# Patient Record
Sex: Female | Born: 1935 | Race: White | Hispanic: No | Smoking: Former smoker
Health system: Southern US, Community
[De-identification: ages and names within clinical notes are randomized; demographics above are authoritative.]

## PROBLEM LIST (undated history)

## (undated) DIAGNOSIS — R42 Dizziness and giddiness: Secondary | ICD-10-CM

## (undated) DIAGNOSIS — R0609 Other forms of dyspnea: Secondary | ICD-10-CM

## (undated) DIAGNOSIS — R0600 Dyspnea, unspecified: Secondary | ICD-10-CM

## (undated) DIAGNOSIS — E785 Hyperlipidemia, unspecified: Secondary | ICD-10-CM

## (undated) DIAGNOSIS — S065XAA Traumatic subdural hemorrhage with loss of consciousness status unknown, initial encounter: Secondary | ICD-10-CM

## (undated) DIAGNOSIS — N184 Chronic kidney disease, stage 4 (severe): Secondary | ICD-10-CM

## (undated) DIAGNOSIS — M1990 Unspecified osteoarthritis, unspecified site: Secondary | ICD-10-CM

## (undated) DIAGNOSIS — I10 Essential (primary) hypertension: Secondary | ICD-10-CM

## (undated) DIAGNOSIS — Y92009 Unspecified place in unspecified non-institutional (private) residence as the place of occurrence of the external cause: Secondary | ICD-10-CM

## (undated) DIAGNOSIS — R54 Age-related physical debility: Secondary | ICD-10-CM

## (undated) DIAGNOSIS — W19XXXA Unspecified fall, initial encounter: Secondary | ICD-10-CM

## (undated) DIAGNOSIS — S065X9A Traumatic subdural hemorrhage with loss of consciousness of unspecified duration, initial encounter: Secondary | ICD-10-CM

## (undated) DIAGNOSIS — I2699 Other pulmonary embolism without acute cor pulmonale: Secondary | ICD-10-CM

## (undated) DIAGNOSIS — Z8679 Personal history of other diseases of the circulatory system: Secondary | ICD-10-CM

## (undated) DIAGNOSIS — K589 Irritable bowel syndrome without diarrhea: Secondary | ICD-10-CM

## (undated) DIAGNOSIS — IMO0001 Reserved for inherently not codable concepts without codable children: Secondary | ICD-10-CM

## (undated) DIAGNOSIS — R5383 Other fatigue: Secondary | ICD-10-CM

## (undated) HISTORY — DX: Other fatigue: R53.83

## (undated) HISTORY — PX: APPENDECTOMY: SHX54

## (undated) HISTORY — DX: Dizziness and giddiness: R42

## (undated) HISTORY — DX: Traumatic subdural hemorrhage with loss of consciousness status unknown, initial encounter: S06.5XAA

## (undated) HISTORY — PX: JOINT REPLACEMENT: SHX530

## (undated) HISTORY — PX: EYE SURGERY: SHX253

## (undated) HISTORY — DX: Irritable bowel syndrome, unspecified: K58.9

## (undated) HISTORY — PX: SHOULDER OPEN ROTATOR CUFF REPAIR: SHX2407

## (undated) HISTORY — PX: CATARACT EXTRACTION W/ INTRAOCULAR LENS  IMPLANT, BILATERAL: SHX1307

## (undated) HISTORY — DX: Traumatic subdural hemorrhage with loss of consciousness of unspecified duration, initial encounter: S06.5X9A

## (undated) HISTORY — DX: Reserved for inherently not codable concepts without codable children: IMO0001

## (undated) HISTORY — DX: Essential (primary) hypertension: I10

## (undated) HISTORY — PX: CHOLECYSTECTOMY: SHX55

## (undated) HISTORY — DX: Personal history of other diseases of the circulatory system: Z86.79

## (undated) HISTORY — DX: Hyperlipidemia, unspecified: E78.5

## (undated) HISTORY — DX: Age-related physical debility: R54

## (undated) HISTORY — PX: SMALL INTESTINE SURGERY: SHX150

---
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
## 1968-08-08 HISTORY — PX: HEMORRHOID SURGERY: SHX153

## 1969-04-08 HISTORY — PX: BREAST CYST EXCISION: SHX579

## 1969-04-08 HISTORY — PX: ABDOMINAL HYSTERECTOMY: SHX81

## 1999-09-08 ENCOUNTER — Ambulatory Visit (HOSPITAL_COMMUNITY): Admission: RE | Admit: 1999-09-08 | Discharge: 1999-09-08 | Payer: Self-pay | Admitting: *Deleted

## 1999-10-12 ENCOUNTER — Ambulatory Visit (HOSPITAL_COMMUNITY): Admission: RE | Admit: 1999-10-12 | Discharge: 1999-10-12 | Payer: Self-pay | Admitting: *Deleted

## 1999-10-12 ENCOUNTER — Encounter (INDEPENDENT_AMBULATORY_CARE_PROVIDER_SITE_OTHER): Payer: Self-pay | Admitting: Specialist

## 2000-01-12 ENCOUNTER — Encounter: Admission: RE | Admit: 2000-01-12 | Discharge: 2000-01-12 | Payer: Self-pay | Admitting: *Deleted

## 2000-08-28 ENCOUNTER — Encounter: Admission: RE | Admit: 2000-08-28 | Discharge: 2000-08-28 | Payer: Self-pay | Admitting: *Deleted

## 2001-08-08 DIAGNOSIS — IMO0001 Reserved for inherently not codable concepts without codable children: Secondary | ICD-10-CM

## 2001-08-08 HISTORY — DX: Reserved for inherently not codable concepts without codable children: IMO0001

## 2003-09-16 ENCOUNTER — Encounter: Admission: RE | Admit: 2003-09-16 | Discharge: 2003-09-16 | Payer: Self-pay | Admitting: Internal Medicine

## 2003-10-27 ENCOUNTER — Encounter: Admission: RE | Admit: 2003-10-27 | Discharge: 2003-12-04 | Payer: Self-pay | Admitting: Internal Medicine

## 2005-07-01 ENCOUNTER — Encounter: Admission: RE | Admit: 2005-07-01 | Discharge: 2005-07-01 | Payer: Self-pay | Admitting: Internal Medicine

## 2005-08-15 ENCOUNTER — Encounter: Admission: RE | Admit: 2005-08-15 | Discharge: 2005-08-15 | Payer: Self-pay | Admitting: Internal Medicine

## 2005-11-16 ENCOUNTER — Encounter: Admission: RE | Admit: 2005-11-16 | Discharge: 2005-11-16 | Payer: Self-pay | Admitting: Neurology

## 2005-12-23 ENCOUNTER — Encounter: Admission: RE | Admit: 2005-12-23 | Discharge: 2005-12-23 | Payer: Self-pay | Admitting: Internal Medicine

## 2006-05-19 ENCOUNTER — Encounter: Admission: RE | Admit: 2006-05-19 | Discharge: 2006-05-19 | Payer: Self-pay | Admitting: Gastroenterology

## 2007-12-26 ENCOUNTER — Ambulatory Visit (HOSPITAL_COMMUNITY): Admission: RE | Admit: 2007-12-26 | Discharge: 2007-12-27 | Payer: Self-pay | Admitting: Orthopedic Surgery

## 2008-06-06 ENCOUNTER — Encounter: Admission: RE | Admit: 2008-06-06 | Discharge: 2008-06-06 | Payer: Self-pay | Admitting: Cardiology

## 2008-06-23 ENCOUNTER — Encounter: Admission: RE | Admit: 2008-06-23 | Discharge: 2008-06-23 | Payer: Self-pay | Admitting: Cardiology

## 2009-05-13 ENCOUNTER — Emergency Department (HOSPITAL_COMMUNITY): Admission: EM | Admit: 2009-05-13 | Discharge: 2009-05-13 | Payer: Self-pay | Admitting: Emergency Medicine

## 2009-09-30 ENCOUNTER — Encounter: Admission: RE | Admit: 2009-09-30 | Discharge: 2009-10-29 | Payer: Self-pay | Admitting: Neurology

## 2009-10-06 ENCOUNTER — Encounter: Admission: RE | Admit: 2009-10-06 | Discharge: 2009-10-06 | Payer: Self-pay | Admitting: Neurology

## 2009-10-16 ENCOUNTER — Observation Stay (HOSPITAL_COMMUNITY): Admission: EM | Admit: 2009-10-16 | Discharge: 2009-10-20 | Payer: Self-pay | Admitting: Emergency Medicine

## 2009-10-18 ENCOUNTER — Ambulatory Visit: Payer: Self-pay | Admitting: Internal Medicine

## 2010-05-18 ENCOUNTER — Ambulatory Visit: Payer: Self-pay | Admitting: Cardiology

## 2010-06-02 ENCOUNTER — Ambulatory Visit: Payer: Self-pay | Admitting: Cardiology

## 2010-06-14 ENCOUNTER — Ambulatory Visit: Payer: Self-pay | Admitting: Cardiology

## 2010-08-24 ENCOUNTER — Encounter
Admission: RE | Admit: 2010-08-24 | Discharge: 2010-08-24 | Payer: Self-pay | Source: Home / Self Care | Attending: Cardiology | Admitting: Cardiology

## 2010-08-29 ENCOUNTER — Encounter: Payer: Self-pay | Admitting: Cardiology

## 2010-08-31 ENCOUNTER — Encounter
Admission: RE | Admit: 2010-08-31 | Discharge: 2010-08-31 | Payer: Self-pay | Source: Home / Self Care | Attending: Cardiology | Admitting: Cardiology

## 2010-09-08 ENCOUNTER — Encounter: Payer: Self-pay | Admitting: Cardiology

## 2010-09-16 ENCOUNTER — Ambulatory Visit (INDEPENDENT_AMBULATORY_CARE_PROVIDER_SITE_OTHER): Payer: Medicare Other | Admitting: Cardiology

## 2010-09-16 DIAGNOSIS — I959 Hypotension, unspecified: Secondary | ICD-10-CM

## 2010-11-01 LAB — URINALYSIS, ROUTINE W REFLEX MICROSCOPIC
Bilirubin Urine: NEGATIVE
Hgb urine dipstick: NEGATIVE
Nitrite: NEGATIVE
Protein, ur: NEGATIVE mg/dL
Urobilinogen, UA: 0.2 mg/dL (ref 0.0–1.0)

## 2010-11-01 LAB — BASIC METABOLIC PANEL
BUN: 25 mg/dL — ABNORMAL HIGH (ref 6–23)
BUN: 26 mg/dL — ABNORMAL HIGH (ref 6–23)
BUN: 27 mg/dL — ABNORMAL HIGH (ref 6–23)
Calcium: 8.1 mg/dL — ABNORMAL LOW (ref 8.4–10.5)
Chloride: 104 mEq/L (ref 96–112)
Chloride: 108 mEq/L (ref 96–112)
GFR calc Af Amer: 29 mL/min — ABNORMAL LOW (ref >60)
GFR calc Af Amer: 32 mL/min — ABNORMAL LOW (ref >60)
GFR calc non Af Amer: 27 mL/min — ABNORMAL LOW (ref >60)
GFR calc non Af Amer: 27 mL/min — ABNORMAL LOW (ref >60)
Glucose, Bld: 91 mg/dL (ref 70–99)
Glucose, Bld: 91 mg/dL (ref 70–99)
Potassium: 3.4 mEq/L — ABNORMAL LOW (ref 3.5–5.1)
Potassium: 3.9 mEq/L (ref 3.5–5.1)
Potassium: 4.6 mEq/L (ref 3.5–5.1)
Sodium: 133 mEq/L — ABNORMAL LOW (ref 135–145)
Sodium: 137 mEq/L (ref 135–145)

## 2010-11-01 LAB — CBC
HCT: 36 % (ref 36.0–46.0)
MCHC: 34.3 g/dL (ref 30.0–36.0)
MCV: 89.2 fL (ref 78.0–100.0)
Platelets: 361 10*3/uL (ref 150–400)
Platelets: 412 10*3/uL — ABNORMAL HIGH (ref 150–400)
RDW: 12.5 % (ref 11.5–15.5)
WBC: 10 10*3/uL (ref 4.0–10.5)

## 2010-11-01 LAB — POCT CARDIAC MARKERS
CKMB, poc: 2.1 ng/mL (ref 1.0–8.0)
Troponin i, poc: <0.05 ng/mL (ref 0.00–0.09)

## 2010-11-01 LAB — COMPREHENSIVE METABOLIC PANEL
AST: 30 U/L (ref 0–37)
BUN: 29 mg/dL — ABNORMAL HIGH (ref 6–23)
CO2: 19 mEq/L (ref 19–32)
Calcium: 8.4 mg/dL (ref 8.4–10.5)
Chloride: 100 mEq/L (ref 96–112)
Creatinine, Ser: 1.82 mg/dL — ABNORMAL HIGH (ref 0.4–1.2)
GFR calc Af Amer: 32 mL/min — ABNORMAL LOW (ref >60)
GFR calc non Af Amer: 27 mL/min — ABNORMAL LOW (ref >60)
Total Bilirubin: 0.5 mg/dL (ref 0.3–1.2)

## 2010-11-01 LAB — DIFFERENTIAL
Eosinophils Relative: 1 % (ref 0–5)
Lymphocytes Relative: 22 % (ref 12–46)
Lymphs Abs: 1.9 10*3/uL (ref 0.7–4.0)
Monocytes Absolute: 0.6 10*3/uL (ref 0.1–1.0)
Monocytes Relative: 7 % (ref 3–12)
Neutro Abs: 6.1 10*3/uL (ref 1.7–7.7)
Neutrophils Relative %: 70 % (ref 43–77)

## 2010-11-01 LAB — TROPONIN I: Troponin I: 0.02 ng/mL (ref 0.00–0.06)

## 2010-11-01 LAB — MAGNESIUM: Magnesium: 1.8 mg/dL (ref 1.5–2.5)

## 2010-11-01 LAB — PHOSPHORUS: Phosphorus: 2.6 mg/dL (ref 2.3–4.6)

## 2010-11-01 LAB — POCT I-STAT, CHEM 8
BUN: 29 mg/dL — ABNORMAL HIGH (ref 6–23)
Calcium, Ion: 1.02 mmol/L — ABNORMAL LOW (ref 1.12–1.32)
Chloride: 101 mEq/L (ref 96–112)
Creatinine, Ser: 1.9 mg/dL — ABNORMAL HIGH (ref 0.4–1.2)
TCO2: 21 mmol/L (ref 0–100)

## 2010-11-01 LAB — CLOSTRIDIUM DIFFICILE EIA: C difficile Toxins A+B, EIA: NEGATIVE

## 2010-11-01 LAB — CK TOTAL AND CKMB (NOT AT ARMC)
CK, MB: 2.9 ng/mL (ref 0.3–4.0)
Relative Index: INVALID (ref 0.0–2.5)

## 2010-11-01 LAB — CREATININE, URINE, RANDOM: Creatinine, Urine: 44.6 mg/dL

## 2010-11-11 LAB — BASIC METABOLIC PANEL
CO2: 22 mEq/L (ref 19–32)
Chloride: 104 mEq/L (ref 96–112)
GFR calc Af Amer: 30 mL/min — ABNORMAL LOW (ref >60)
Potassium: 3.5 mEq/L (ref 3.5–5.1)
Sodium: 137 mEq/L (ref 135–145)

## 2010-11-11 LAB — DIFFERENTIAL
Basophils Relative: 0 % (ref 0–1)
Eosinophils Absolute: 0.1 10*3/uL (ref 0.0–0.7)
Lymphs Abs: 1.4 10*3/uL (ref 0.7–4.0)
Monocytes Absolute: 0.4 10*3/uL (ref 0.1–1.0)
Monocytes Relative: 6 % (ref 3–12)

## 2010-11-11 LAB — CBC
HCT: 37.4 % (ref 36.0–46.0)
Hemoglobin: 12.7 g/dL (ref 12.0–15.0)
MCHC: 33.9 g/dL (ref 30.0–36.0)
MCV: 88.4 fL (ref 78.0–100.0)
RBC: 4.23 MIL/uL (ref 3.87–5.11)

## 2010-11-11 LAB — POCT CARDIAC MARKERS: CKMB, poc: 3.2 ng/mL (ref 1.0–8.0)

## 2010-12-21 NOTE — Op Note (Signed)
NAME:  Nicole Harding, OPPERMAN NO.:  [id number]   MEDICAL RECORD NO.:  [id number]          PATIENT TYPE:  AMB   LOCATION:  DAY                          FACILITY:  [facility]   PHYSICIAN:  Ronald A. Gioffre, M.D.DATE OF BIRTH:  Jul 23, 1924   DATE OF PROCEDURE:  12/26/2007  DATE OF DISCHARGE:                               OPERATIVE REPORT   SURGEON:  Georges Lynch. Darrelyn Hillock, M.D.   ASSISTANT:  Jamelle Rushing, P.A.   PREOPERATIVE DIAGNOSES:  1. Complete rotator cuff tendon tear on the left.  2. Severe impingement syndrome on the left at the shoulder.   POSTOPERATIVE DIAGNOSES:  1. Complete rotator cuff tendon tear on the left.  2. Severe impingement syndrome on the left at the shoulder.   OPERATIONS:  1. Partial acromionectomy acromioplasty of the left shoulder.  2. Repair of a complete tear of the rotator cuff tear on the left      shoulder.  3. Restore graft left shoulder with one PEEK anchor.   DESCRIPTION OF PROCEDURE:  Under general anesthesia, routine orthopedic  prep and draping of the left shoulder was carried out.  Note in the  holding area the patient had interscalene nerve block.  At this time,  incision was made over the anterior aspect of the left shoulder.  I  dissected deltoid tendon off the acromion by sharp dissection.  I then  exposed the rotator cuff.  I protected the cup with a Bennett retractor  utilizing oscillating saw and did a partial acromionectomy and then I  utilized a bur to acromioplasty.  After we reestablished the subacromial  space, I identified a long longitudinal tear that was quite irregular of  the rotator cuff.  I then retracted both ends and then utilized the bur  to bur the lateral articular surface of the humerus.  Following that, a  PEEK anchor with four sutures was inserted into the proximal humerus.  The anchor was tested for stability.  It was well seated within the  humerus.  Following that, I then repaired the rotator cuff tear in  a  longitudinal fashion with number one Ethibond suture.  Following that, I  utilized a Restore graft to reinforce the repair.  Following that, I  used the anchor sutures to  anchor the distal part of the graft.  Thoroughly irrigated the area out  and closed the wound layers in usual fashion after I reapproximated the  deltoid tendon and muscle.  Sterile Neosporin dressings were applied.  The patient was placed in the shoulder immobilizer.           ______________________________  Georges Lynch Darrelyn Hillock, M.D.     RAG/MEDQ  D:  12/26/2007  T:  12/26/2007  Job:  [id number]   cc:   [name], M.D.  Fax: [phone]

## 2011-01-31 ENCOUNTER — Other Ambulatory Visit: Payer: Self-pay | Admitting: Cardiology

## 2011-01-31 DIAGNOSIS — R921 Mammographic calcification found on diagnostic imaging of breast: Secondary | ICD-10-CM

## 2011-02-07 ENCOUNTER — Ambulatory Visit
Admission: RE | Admit: 2011-02-07 | Discharge: 2011-02-07 | Disposition: A | Discharge status: Home / Self Care | Payer: Medicare Other | Source: Ambulatory Visit | Attending: Cardiology | Admitting: Cardiology

## 2011-02-07 DIAGNOSIS — R921 Mammographic calcification found on diagnostic imaging of breast: Secondary | ICD-10-CM

## 2011-02-14 ENCOUNTER — Encounter: Payer: Self-pay | Admitting: Cardiology

## 2011-02-18 ENCOUNTER — Encounter: Payer: Self-pay | Admitting: Cardiology

## 2011-02-21 ENCOUNTER — Encounter: Payer: Self-pay | Admitting: Cardiology

## 2011-02-21 ENCOUNTER — Ambulatory Visit (INDEPENDENT_AMBULATORY_CARE_PROVIDER_SITE_OTHER): Payer: Medicare Other | Admitting: Cardiology

## 2011-02-21 VITALS — BP 140/70 | HR 82 | Wt 151.0 lb

## 2011-02-21 DIAGNOSIS — Z8679 Personal history of other diseases of the circulatory system: Secondary | ICD-10-CM

## 2011-02-21 DIAGNOSIS — G20 Parkinson's disease: Secondary | ICD-10-CM

## 2011-02-21 DIAGNOSIS — I119 Hypertensive heart disease without heart failure: Secondary | ICD-10-CM

## 2011-02-21 DIAGNOSIS — K589 Irritable bowel syndrome without diarrhea: Secondary | ICD-10-CM

## 2011-02-21 DIAGNOSIS — G20A1 Parkinson's disease without dyskinesia, without mention of fluctuations: Secondary | ICD-10-CM | POA: Insufficient documentation

## 2011-02-21 DIAGNOSIS — Z780 Asymptomatic menopausal state: Secondary | ICD-10-CM

## 2011-02-21 NOTE — Assessment & Plan Note (Signed)
The patient has a past history of essential hypertension but also has a history of orthostatic hypotension and has had several episodes of passing out.  The last episode occurred approximately 2 months ago.  She was standing in the bathroom doing her hair and suddenly became dizzy and fell before her husband could catch her.  She injured her n left shoulder.  She did not go to the emergency room.  She has not seen an orthopedist And her left shoulder is still uncomfortable.

## 2011-02-21 NOTE — Patient Instructions (Signed)
Decrease HCTZ to every other day. Wear support hose to keep BP from falling when you are standing.

## 2011-02-21 NOTE — Progress Notes (Signed)
Nicole Harding Date of Birth:  [birth year] [facility] [address]   [address] [place], [place]  [address] [phone]           Fax   [phone]  History of Present Illness: This pleasant 77 year old woman is seen for a scheduled followup office visit.  She has a past history of essential hypertension.  She's also had a past history of occasional orthostatic hypotension with falls.  Her last fall was 2 months ago.  She has been taking hydrochlorothiazide a half tablet daily we previously instructed her to she has continued to take it every day on her most recent fall she injured her Left shoulder but has not seen an orthopedist the patient does not have any history of ischemic heart disease.  She had a normal nuclear stress test in 2003 at [facility] and vascular Center.  Current Outpatient Prescriptions  Medication Sig Dispense Refill  . estrogens, conjugated, (PREMARIN) 0.625 MG tablet Take 0.625 mg by mouth daily. Take daily for 21 days then do not take for 7 days.       Marland Kitchen FLUoxetine (PROZAC) 40 MG capsule Take 40 mg by mouth daily.        . hydrALAZINE (APRESOLINE) 25 MG tablet Take 25 mg by mouth 2 (two) times daily.        . hydrochlorothiazide (,MICROZIDE/HYDRODIURIL,) 12.5 MG capsule Take 12.5 mg by mouth every other day.        . Multiple Vitamins-Minerals (ICAPS PO) Take by mouth daily.          Allergies  Allergen Reactions  . Codeine   . Lipitor (Atorvastatin Calcium)   . Norvasc (Amlodipine Besylate)   . Red Yeast Rice   . Zetia (Ezetimibe)     Patient Active Problem List  Diagnoses  . History of orthostatic hypotension  . Irritable bowel syndrome  . Parkinson's disease  . Postmenopausal state    History  Smoking status  . Former Smoker  . Quit date: 02/14/1971  Smokeless tobacco  . Not on file    History  Alcohol Use No    Family History  Problem Relation Age of Onset  . Stroke Father     Review of  Systems: Constitutional: no fever chills diaphoresis or fatigue or change in weight.  Head and neck: no hearing loss, no epistaxis, no photophobia or visual disturbance. Respiratory: No cough, shortness of breath or wheezing. Cardiovascular: No chest pain peripheral edema, palpitations. Gastrointestinal: No abdominal distention, no abdominal pain, no change in bowel habits hematochezia or melena. Genitourinary: No dysuria, no frequency, no urgency, no nocturia. Musculoskeletal:No arthralgias, no back pain, no gait disturbance or myalgias. Neurological: No dizziness, no headaches, no numbness, no seizures, no syncope, no weakness, no tremors. Hematologic: No lymphadenopathy, no easy bruising. Psychiatric: No confusion, no hallucinations, no sleep disturbance.    Physical Exam: Filed Vitals:   02/21/11 1351  BP: 140/70  Pulse: 82  The general appearance reveals a well-developed well-nourished woman in no distress.  Her blood pressure supine is 140/70 but when she stands up it drops to 120/70.Pupils equal and reactive.   Extraocular Movements are full.  There is no scleral icterus.  The mouth and pharynx are normal.  The neck is supple.  The carotids reveal no bruits.  The jugular venous pressure is normal.  The thyroid is not enlarged.  There is no lymphadenopathy.The chest is clear to percussion and auscultation. There are no rales or rhonchi.  Expansion of the chest is symmetrical.The precordium is quiet.  The first heart sound is normal.  The second heart sound is physiologically split.  There is no murmur gallop rub or click.  There is no abnormal lift or heaveThe abdomen is soft and nontender. Bowel sounds are normal. The liver and spleen are not enlarged. There Are no abdominal masses. There are no bruits.The pedal pulses are good.  There is no phlebitis or edema.  There is no cyanosis or clubbing.Strength is normal and symmetrical in all extremities.  There is no lateralizing weakness.  There  are no sensory deficits.   Assessment / Plan:  I believe that her symptoms of occasional dizziness and occasional falls is related to orthostatic hypotension from her hydrochlorothiazide.  We will have her reduce the dose to just a half a tablet every other day.  Also recommended that she wear support hose particularly when she will be standing for long periods of time in one place.  Her be rechecked here in 4 months at which time we'll get fasting blood work and a CBC

## 2011-03-25 ENCOUNTER — Telehealth: Payer: Self-pay | Admitting: Cardiology

## 2011-03-25 DIAGNOSIS — R42 Dizziness and giddiness: Secondary | ICD-10-CM

## 2011-03-25 MED ORDER — MECLIZINE HCL 25 MG PO TABS: 25.0000 mg | ORAL_TABLET | Freq: Four times a day (QID) | ORAL | Status: AC | PRN

## 2011-03-25 NOTE — Telephone Encounter (Signed)
States she is dizzy and has had vertigo on and off in the past and this is vertigo.  Use brown gardiner. Has taken Antivert in the past. Please advise

## 2011-03-25 NOTE — Telephone Encounter (Signed)
begin meclizine 25 mg every 6 hours p.r.n. #30 refill x5

## 2011-03-25 NOTE — Telephone Encounter (Signed)
Advised patient Antivert sent to [facility]

## 2011-03-25 NOTE — Telephone Encounter (Signed)
Pt is having vertigo since yesterday She wants some meds please call

## 2011-05-04 LAB — URINE MICROSCOPIC-ADD ON

## 2011-05-04 LAB — COMPREHENSIVE METABOLIC PANEL
ALT: 18
Alkaline Phosphatase: 60
CO2: 22
Calcium: 8.8
GFR calc non Af Amer: 33 — ABNORMAL LOW
Glucose, Bld: 127 — ABNORMAL HIGH
Potassium: 3.7
Sodium: 136

## 2011-05-04 LAB — PROTIME-INR
INR: 1
Prothrombin Time: 13.3

## 2011-05-04 LAB — DIFFERENTIAL
Basophils Relative: 0
Eosinophils Absolute: 0.1
Neutrophils Relative %: 77

## 2011-05-04 LAB — CBC
HCT: 36.2
Hemoglobin: 12.3
MCHC: 34
RBC: 4.04

## 2011-05-04 LAB — URINALYSIS, ROUTINE W REFLEX MICROSCOPIC
Bilirubin Urine: NEGATIVE
Glucose, UA: NEGATIVE
Hgb urine dipstick: NEGATIVE
Nitrite: NEGATIVE
Specific Gravity, Urine: 1.014
pH: 5.5

## 2011-05-04 LAB — URINE CULTURE

## 2011-05-04 LAB — TYPE AND SCREEN
ABO/RH(D): A POS
Antibody Screen: NEGATIVE

## 2011-06-09 ENCOUNTER — Telehealth: Payer: Self-pay | Admitting: Cardiology

## 2011-06-09 ENCOUNTER — Encounter: Payer: Self-pay | Admitting: *Deleted

## 2011-06-09 NOTE — Telephone Encounter (Signed)
Pt calling wanting to get permission/an order for pt to get shingles vaccine. Pt contacted [facility] ,[phone], and was told she needs to get prescription from pt MD in order to get shingles vaccine. Please sent order/ prescription to [facility].  Please return pt call to discuss further.   [employer] Fax #: [phone]

## 2011-06-09 NOTE — Telephone Encounter (Signed)
PT AWARE MAY HAVE SHINGLE VACCINE PER LORI GERHARDT NP. SEE LETTERS NOTE FAXED TO [facility] .Zack Seal

## 2011-06-13 ENCOUNTER — Other Ambulatory Visit (INDEPENDENT_AMBULATORY_CARE_PROVIDER_SITE_OTHER): Payer: Medicare Other | Admitting: *Deleted

## 2011-06-13 DIAGNOSIS — I119 Hypertensive heart disease without heart failure: Secondary | ICD-10-CM

## 2011-06-13 LAB — CBC WITH DIFFERENTIAL/PLATELET
Eosinophils Relative: 2.3 % (ref 0.0–5.0)
HCT: 37.8 % (ref 36.0–46.0)
Hemoglobin: 12.9 g/dL (ref 12.0–15.0)
Lymphs Abs: 1.7 10*3/uL (ref 0.7–4.0)
Monocytes Relative: 7.5 % (ref 3.0–12.0)
Platelets: 320 10*3/uL (ref 150.0–400.0)
WBC: 8.2 10*3/uL (ref 4.5–10.5)

## 2011-06-13 LAB — BASIC METABOLIC PANEL
GFR: 27.03 mL/min — ABNORMAL LOW (ref >60.00)
Potassium: 3.6 mEq/L (ref 3.5–5.1)
Sodium: 139 mEq/L (ref 135–145)

## 2011-06-13 LAB — HEPATIC FUNCTION PANEL
ALT: 15 U/L (ref 0–35)
Total Protein: 6.8 g/dL (ref 6.0–8.3)

## 2011-06-20 ENCOUNTER — Ambulatory Visit (INDEPENDENT_AMBULATORY_CARE_PROVIDER_SITE_OTHER): Payer: Medicare Other | Admitting: Cardiology

## 2011-06-20 ENCOUNTER — Encounter: Payer: Self-pay | Admitting: Cardiology

## 2011-06-20 VITALS — BP 156/80 | HR 88 | Ht 65.0 in | Wt 147.0 lb

## 2011-06-20 DIAGNOSIS — K589 Irritable bowel syndrome without diarrhea: Secondary | ICD-10-CM

## 2011-06-20 DIAGNOSIS — I119 Hypertensive heart disease without heart failure: Secondary | ICD-10-CM

## 2011-06-20 DIAGNOSIS — G20 Parkinson's disease: Secondary | ICD-10-CM

## 2011-06-20 DIAGNOSIS — I951 Orthostatic hypotension: Secondary | ICD-10-CM

## 2011-06-20 DIAGNOSIS — Z8679 Personal history of other diseases of the circulatory system: Secondary | ICD-10-CM

## 2011-06-20 NOTE — Patient Instructions (Signed)
Your physician recommends that you continue on your current medications as directed. Please refer to the Current Medication list given to you today. Your physician recommends that you schedule a follow-up appointment in: 4 month with fasting labs (/lp/bmet/hfp/cbc)

## 2011-06-20 NOTE — Assessment & Plan Note (Signed)
She has very mild atypical Parkinson's.  She is not presently on any anti-Parkinson's medication

## 2011-06-20 NOTE — Assessment & Plan Note (Signed)
Recent bowel habits have been stable with no acute symptoms of diarrhea or severe constipation.

## 2011-06-20 NOTE — Assessment & Plan Note (Signed)
Since last visit she's had no further episodes of orthostatic dizziness or syncope.

## 2011-06-20 NOTE — Progress Notes (Signed)
Nicole Harding Date of Birth:  05/07/1924 [facility] [address]   [address] [place], [place]  [address] [phone]           Fax   [phone]  History of Present Illness: This pleasant 77 year old woman is seen for a scheduled four-month followup office visit.  She has a past history of essential hypertension and has had previous hours with orthostatic hypotension.  The symptoms of dizziness have improved since we cut back on her medication last visit.  She has had no further dizzy spells or syncope.  Denies any chest pain.  She is not having any significant shortness of breath at this time.  Patient also has a history of atypical Parkinson's disease.  She has a history of irritable bowel syndrome followed by GI.  She has a history of postmenopausal state on Premarin.  Current Outpatient Prescriptions  Medication Sig Dispense Refill  . estrogens, conjugated, (PREMARIN) 0.625 MG tablet Take 0.625 mg by mouth daily. Take daily for 21 days then do not take for 7 days.       . hydrALAZINE (APRESOLINE) 25 MG tablet Take 25 mg by mouth 2 (two) times daily as needed.       . hydrochlorothiazide (,MICROZIDE/HYDRODIURIL,) 12.5 MG capsule Take 12.5 mg by mouth every other day.       . Multiple Vitamins-Minerals (ICAPS PO) Take by mouth daily.          Allergies  Allergen Reactions  . Codeine   . Lipitor (Atorvastatin Calcium)   . Norvasc (Amlodipine Besylate)   . Red Yeast Rice   . Zetia (Ezetimibe)     Patient Active Problem List  Diagnoses  . History of orthostatic hypotension  . Irritable bowel syndrome  . Parkinson's disease  . Postmenopausal state    History  Smoking status  . Former Smoker  . Quit date: 02/14/1971  Smokeless tobacco  . Not on file    History  Alcohol Use No    Family History  Problem Relation Age of Onset  . Stroke Father     Review of Systems: Constitutional: no fever chills diaphoresis or fatigue or change  in weight.  Head and neck: no hearing loss, no epistaxis, no photophobia or visual disturbance. Respiratory: No cough, shortness of breath or wheezing. Cardiovascular: No chest pain peripheral edema, palpitations. Gastrointestinal: No abdominal distention, no abdominal pain, no change in bowel habits hematochezia or melena. Genitourinary: No dysuria, no frequency, no urgency, no nocturia. Musculoskeletal:No arthralgias, no back pain, no gait disturbance or myalgias. Neurological: No dizziness, no headaches, no numbness, no seizures, no syncope, no weakness, no tremors. Hematologic: No lymphadenopathy, no easy bruising. Psychiatric: No confusion, no hallucinations, no sleep disturbance.    Physical Exam: Filed Vitals:   06/20/11 1047  BP: 156/80  Pulse: 88   The patient appears to be in no distress.  Head and neck exam reveals that the pupils are equal and reactive.  The extraocular movements are full.  There is no scleral icterus.  Mouth and pharynx are benign.  No lymphadenopathy.  No carotid bruits.  The jugular venous pressure is normal.  Thyroid is not enlarged or tender.  Chest is clear to percussion and auscultation.  No rales or rhonchi.  Expansion of the chest is symmetrical.  Heart reveals no abnormal lift or heave.  First and second heart sounds are normal.  There is no murmur gallop rub or click.  The abdomen is soft and  nontender.  Bowel sounds are normoactive.  There is no hepatosplenomegaly or mass.  There are no abdominal bruits.  Extremities reveal no phlebitis or edema.  Pedal pulses are good.  There is no cyanosis or clubbing.  Neurologic exam is normal strength and no lateralizing weakness.  No sensory deficits.  Integument reveals no rash  Assessment / Plan:  Continue same medication.  Recheck in 4 months for followup office visit that the panel hepatic function panel is a metabolic panel and CBC

## 2011-07-18 ENCOUNTER — Other Ambulatory Visit: Payer: Self-pay | Admitting: Cardiology

## 2011-07-18 MED ORDER — ESTROGENS CONJUGATED 0.625 MG PO TABS: 0.6250 mg | ORAL_TABLET | Freq: Every day | ORAL | Status: DC

## 2011-08-25 ENCOUNTER — Other Ambulatory Visit: Payer: Self-pay | Admitting: Cardiology

## 2011-08-25 DIAGNOSIS — Z1231 Encounter for screening mammogram for malignant neoplasm of breast: Secondary | ICD-10-CM

## 2011-08-28 ENCOUNTER — Emergency Department (HOSPITAL_COMMUNITY)
Admission: EM | Admit: 2011-08-28 | Discharge: 2011-08-28 | Disposition: A | Discharge status: Home / Self Care | Payer: Medicare Other | Attending: Emergency Medicine | Admitting: Emergency Medicine

## 2011-08-28 ENCOUNTER — Encounter (HOSPITAL_COMMUNITY): Payer: Self-pay | Admitting: Family Medicine

## 2011-08-28 ENCOUNTER — Other Ambulatory Visit: Payer: Self-pay

## 2011-08-28 DIAGNOSIS — I10 Essential (primary) hypertension: Secondary | ICD-10-CM | POA: Insufficient documentation

## 2011-08-28 DIAGNOSIS — R110 Nausea: Secondary | ICD-10-CM | POA: Insufficient documentation

## 2011-08-28 DIAGNOSIS — R5383 Other fatigue: Secondary | ICD-10-CM | POA: Insufficient documentation

## 2011-08-28 DIAGNOSIS — Z79899 Other long term (current) drug therapy: Secondary | ICD-10-CM | POA: Insufficient documentation

## 2011-08-28 DIAGNOSIS — R5381 Other malaise: Secondary | ICD-10-CM | POA: Insufficient documentation

## 2011-08-28 DIAGNOSIS — R55 Syncope and collapse: Secondary | ICD-10-CM | POA: Insufficient documentation

## 2011-08-28 DIAGNOSIS — G20 Parkinson's disease: Secondary | ICD-10-CM | POA: Insufficient documentation

## 2011-08-28 DIAGNOSIS — K589 Irritable bowel syndrome without diarrhea: Secondary | ICD-10-CM | POA: Insufficient documentation

## 2011-08-28 DIAGNOSIS — R42 Dizziness and giddiness: Secondary | ICD-10-CM | POA: Insufficient documentation

## 2011-08-28 DIAGNOSIS — G20A1 Parkinson's disease without dyskinesia, without mention of fluctuations: Secondary | ICD-10-CM | POA: Insufficient documentation

## 2011-08-28 HISTORY — DX: Dizziness and giddiness: R42

## 2011-08-28 LAB — COMPREHENSIVE METABOLIC PANEL
Alkaline Phosphatase: 86 U/L (ref 39–117)
BUN: 36 mg/dL — ABNORMAL HIGH (ref 6–23)
CO2: 20 mEq/L (ref 19–32)
Chloride: 97 mEq/L (ref 96–112)
Creatinine, Ser: 1.7 mg/dL — ABNORMAL HIGH (ref 0.50–1.10)
GFR calc non Af Amer: 26 mL/min — ABNORMAL LOW (ref >90)
Glucose, Bld: 98 mg/dL (ref 70–99)
Potassium: 3.3 mEq/L — ABNORMAL LOW (ref 3.5–5.1)
Total Bilirubin: 0.3 mg/dL (ref 0.3–1.2)

## 2011-08-28 LAB — URINALYSIS, ROUTINE W REFLEX MICROSCOPIC
Glucose, UA: NEGATIVE mg/dL
Ketones, ur: NEGATIVE mg/dL
Specific Gravity, Urine: 1.004 — ABNORMAL LOW (ref 1.005–1.030)
pH: 6 (ref 5.0–8.0)

## 2011-08-28 LAB — DIFFERENTIAL
Lymphocytes Relative: 14 % (ref 12–46)
Lymphs Abs: 1.5 10*3/uL (ref 0.7–4.0)
Monocytes Absolute: 0.5 10*3/uL (ref 0.1–1.0)
Monocytes Relative: 5 % (ref 3–12)
Neutro Abs: 9.1 10*3/uL — ABNORMAL HIGH (ref 1.7–7.7)

## 2011-08-28 LAB — CBC
HCT: 40.5 % (ref 36.0–46.0)
Hemoglobin: 13.7 g/dL (ref 12.0–15.0)
MCHC: 33.8 g/dL (ref 30.0–36.0)
RBC: 4.59 MIL/uL (ref 3.87–5.11)
WBC: 11.2 10*3/uL — ABNORMAL HIGH (ref 4.0–10.5)

## 2011-08-28 LAB — URINE CULTURE

## 2011-08-28 LAB — URINE MICROSCOPIC-ADD ON

## 2011-08-28 MED ORDER — SODIUM CHLORIDE 0.9 % IV BOLUS (SEPSIS)
1000.0000 mL | Freq: Once | INTRAVENOUS | Status: AC
  Administered 2011-08-28: 1000 mL via INTRAVENOUS

## 2011-08-28 MED ORDER — SODIUM CHLORIDE 0.9 % IV SOLN
Freq: Once | INTRAVENOUS | Status: AC
  Administered 2011-08-28: 17:00:00 via INTRAVENOUS

## 2011-08-28 NOTE — ED Notes (Signed)
Attempted to ambulate pt. Upon standing c/o dizziness, weak legs & "wobbly knees". Pt unsteady on feet, did not attempt to ambulate. Continues to be hypertensive. Placed back into bed. Dr. Effie Shy informed & aware. Given Happy Meal & soda to drink.

## 2011-08-28 NOTE — ED Notes (Addendum)
C/o dizziness upon standing while doing orthostatic vital signs.

## 2011-08-28 NOTE — ED Notes (Signed)
Reports sudden onset generalized weakness "all over", dizziness, lightheadedness & had a near syncopal episode.  Denies CP, palpitations, SOB, n/v, fever, cold, cough. Presently denies dizziness. States has frequent dizzy & fainting spells.  Hx orthostatic hypotension, vertigo. States has not had BP meds yet today

## 2011-08-28 NOTE — ED Notes (Signed)
No voiced complaints presently. NAD. Nicole KitchenDenies pain or dizziness

## 2011-08-28 NOTE — ED Provider Notes (Signed)
History     CSN: [id number]  Arrival date & time 08/28/11  1134   First MD Initiated Contact with Patient 08/28/11 1136      Chief Complaint  Patient presents with  . Dizziness    (Consider location/radiation/quality/duration/timing/severity/associated sxs/prior treatment) Patient is a 78 y.o. female presenting with weakness. The history is provided by the patient and a relative.  Weakness The primary symptoms include dizziness and nausea. Primary symptoms do not include headaches, loss of consciousness, seizures, focal weakness, loss of sensation, fever or vomiting. The symptoms began 1 to 2 hours ago. The episode lasted 15 minutes. The symptoms are improving. The neurological symptoms are diffuse.  Dizziness also occurs with nausea and weakness. Dizziness does not occur with vomiting.  Additional symptoms include weakness. Medical issues do not include cerebral vascular accident or cancer.   Patient was evaluated by EMS and found to have blood sugar 150 shortly after their arrival. Patient was transported without additional treatment.  She did not have any associated chest pain, shortness of breath or focal weakness. The patient states she went discharge, without because she commonly has stool frequency after eating.   Past Medical History  Diagnosis Date  . Hypertension   . History of orthostatic hypotension   . IBS (irritable bowel syndrome)   . Parkinson's disease     ATYPICAL  . Dizziness   . Vertigo     Past Surgical History  Procedure Date  . Tonsillectomy   . Cholecystectomy   . Breast cyst removed   . Abdominal hysterectomy     Family History  Problem Relation Age of Onset  . Stroke Father     History  Substance Use Topics  . Smoking status: Former Smoker    Quit date: 02/14/1971  . Smokeless tobacco: Not on file  . Alcohol Use: No    OB History    Grav Para Term Preterm Abortions TAB SAB Ect Mult Living                  Review of Systems    Constitutional: Negative for fever.  Gastrointestinal: Positive for nausea. Negative for vomiting.  Neurological: Positive for dizziness and weakness. Negative for focal weakness, seizures, loss of consciousness and headaches.  All other systems reviewed and are negative.    Allergies  Codeine; Lipitor; Lisinopril; Norvasc; Red yeast rice; and Zetia  Home Medications   Current Outpatient Rx  Name Route Sig Dispense Refill  . ESTROGENS CONJUGATED 0.625 MG PO TABS Oral Take 0.625 mg by mouth daily.    Marland Kitchen HYDROCHLOROTHIAZIDE 25 MG PO TABS Oral Take 12.5 mg by mouth daily.    . ADULT MULTIVITAMIN W/MINERALS CH Oral Take 1 tablet by mouth daily.    . ICAPS PO Oral Take by mouth daily.        BP 181/80  Pulse 60  Temp(Src) 97.5 F (36.4 C) (Oral)  Resp 16  SpO2 96%  Physical Exam  Nursing note and vitals reviewed. Constitutional: She is oriented to person, place, and time. She appears well-developed and well-nourished.  HENT:  Head: Normocephalic and atraumatic.  Eyes: Conjunctivae and EOM are normal. Pupils are equal, round, and reactive to light.  Neck: Normal range of motion and phonation normal. Neck supple.  Cardiovascular: Normal rate, regular rhythm and intact distal pulses.   Pulmonary/Chest: Effort normal and breath sounds normal. She exhibits no tenderness.  Abdominal: Soft. She exhibits no distension. There is no tenderness. There is no guarding.  Musculoskeletal:  Normal range of motion.  Neurological: She is alert and oriented to person, place, and time. She has normal strength. She exhibits normal muscle tone.  Skin: Skin is warm and dry.  Psychiatric: She has a normal mood and affect. Her behavior is normal. Judgment and thought content normal.    ED Course  Procedures (including critical care time)   Date: 08/28/2011  Rate: 60  Rhythm: normal sinus rhythm  QRS Axis: normal  Intervals: normal  ST/T Wave abnormalities: normal  Conduction  Disutrbances:first-degree A-V block   Narrative Interpretation:   Old EKG Reviewed: unchanged Orthostatics were done with positive finding. She was subsequently treated with IV fluids x1 L. 4:27 PM Reevaluation with update and discussion. After initial assessment and treatment, an updated evaluation reveals Ambulation trial. Pt weak with standing. Will feed pt and reassess. Chameka Mcmullen L    Labs Reviewed  CBC - Abnormal; Notable for the following:    WBC 11.2 (*)    All other components within normal limits  DIFFERENTIAL - Abnormal; Notable for the following:    Neutrophils Relative 81 (*)    Neutro Abs 9.1 (*)    All other components within normal limits  COMPREHENSIVE METABOLIC PANEL - Abnormal; Notable for the following:    Sodium 133 (*)    Potassium 3.3 (*)    BUN 36 (*)    Creatinine, Ser 1.70 (*)    GFR calc non Af Amer 26 (*)    GFR calc Af Amer 30 (*)    All other components within normal limits  URINALYSIS, ROUTINE W REFLEX MICROSCOPIC  URINE CULTURE   No results found.   1. Near syncope       MDM  Nonspecific near-syncope associated with decreased PO intake, voluntary per pt. Doubt CVA, ACS, SBI, metabolic instability.  stooling abnormality with feeding. Her chronic diarrhea does not require workup at this time.       Flint Melter, MD 08/29/11 [phone]

## 2011-08-28 NOTE — ED Notes (Signed)
Informed patient and/or family of status. No voiced complaints presently. NAD.  

## 2011-08-28 NOTE — ED Notes (Signed)
Patient attempted to use bedpan to get urine sample. Patient missed bedpan, will try again to get a urine sample.

## 2011-08-28 NOTE — ED Notes (Signed)
Patient used a Engineer, manufacturing systems.

## 2011-08-28 NOTE — ED Notes (Signed)
Family at bedside. 

## 2011-08-28 NOTE — ED Notes (Signed)
Per EMS, pt left church and felt dizzy and weak. Pt was slow to respond. Denied LOC. 170/100. CBG 152. Denies pain.

## 2011-08-29 ENCOUNTER — Telehealth: Payer: Self-pay | Admitting: Cardiology

## 2011-08-29 NOTE — Telephone Encounter (Signed)
New Problem:    Patient is having dizzy and blackout spells, was admitted to the hospital this past sunday and was instructed to call in and request a home health agent to arrange physical therapy to prevent falling. Please call back.

## 2011-08-29 NOTE — Telephone Encounter (Signed)
Dr. Patty Sermons PCP.  Advised would discuss with  Dr. Patty Sermons and call her back tomorrow (he is out today)

## 2011-09-06 ENCOUNTER — Telehealth: Payer: Self-pay | Admitting: Cardiology

## 2011-09-06 NOTE — Telephone Encounter (Signed)
Will refax information 

## 2011-09-06 NOTE — Telephone Encounter (Signed)
New problem [facility] called  She said she needs more info for order sent over for this pt Dx, ins info, current med list, and last ov note

## 2011-09-06 NOTE — Telephone Encounter (Signed)
Follow-up:    Patient called wondering when she was going to get a call back regarding her initial issue. Please call back.

## 2011-09-06 NOTE — Telephone Encounter (Signed)
Order sent last week and had to send additional information today.  Advised patient if she doesn't hear from them within 24 hours to call back

## 2011-09-14 DIAGNOSIS — I119 Hypertensive heart disease without heart failure: Secondary | ICD-10-CM

## 2011-09-16 ENCOUNTER — Ambulatory Visit
Admission: RE | Admit: 2011-09-16 | Discharge: 2011-09-16 | Disposition: A | Discharge status: Home / Self Care | Payer: Medicare Other | Source: Ambulatory Visit | Attending: Cardiology | Admitting: Cardiology

## 2011-09-16 DIAGNOSIS — Z1231 Encounter for screening mammogram for malignant neoplasm of breast: Secondary | ICD-10-CM

## 2011-09-23 ENCOUNTER — Other Ambulatory Visit (INDEPENDENT_AMBULATORY_CARE_PROVIDER_SITE_OTHER): Payer: Medicare Other | Admitting: *Deleted

## 2011-09-23 DIAGNOSIS — I119 Hypertensive heart disease without heart failure: Secondary | ICD-10-CM

## 2011-09-23 LAB — LIPID PANEL
Cholesterol: 95 mg/dL (ref 0–200)
HDL: 43.1 mg/dL (ref >39.00)
Triglycerides: 27 mg/dL (ref 0.0–149.0)

## 2011-09-23 LAB — BASIC METABOLIC PANEL
BUN: 48 mg/dL — ABNORMAL HIGH (ref 6–23)
CO2: 25 mEq/L (ref 19–32)
Calcium: 8.7 mg/dL (ref 8.4–10.5)
Creatinine, Ser: 2.7 mg/dL — ABNORMAL HIGH (ref 0.4–1.2)
Glucose, Bld: 103 mg/dL — ABNORMAL HIGH (ref 70–99)

## 2011-09-23 LAB — CBC WITH DIFFERENTIAL/PLATELET
Basophils Absolute: 0 10*3/uL (ref 0.0–0.1)
Basophils Relative: 0.1 % (ref 0.0–3.0)
HCT: 32.8 % — ABNORMAL LOW (ref 36.0–46.0)
Hemoglobin: 10.9 g/dL — ABNORMAL LOW (ref 12.0–15.0)
Lymphs Abs: 0.6 10*3/uL — ABNORMAL LOW (ref 0.7–4.0)
Monocytes Relative: 9.1 % (ref 3.0–12.0)
Neutro Abs: 4.1 10*3/uL (ref 1.4–7.7)
RBC: 3.26 Mil/uL — ABNORMAL LOW (ref 3.87–5.11)
RDW: 22.1 % — ABNORMAL HIGH (ref 11.5–14.6)

## 2011-09-23 LAB — HEPATIC FUNCTION PANEL
ALT: 16 U/L (ref 0–35)
Albumin: 4.3 g/dL (ref 3.5–5.2)
Bilirubin, Direct: 0.1 mg/dL (ref 0.0–0.3)
Total Protein: 6.6 g/dL (ref 6.0–8.3)

## 2011-09-25 NOTE — Progress Notes (Signed)
Quick Note:  Please make copy of labs for patient visit. ______ 

## 2011-09-26 ENCOUNTER — Ambulatory Visit (INDEPENDENT_AMBULATORY_CARE_PROVIDER_SITE_OTHER): Payer: Medicare Other | Admitting: Cardiology

## 2011-09-26 ENCOUNTER — Encounter: Payer: Self-pay | Admitting: Cardiology

## 2011-09-26 ENCOUNTER — Other Ambulatory Visit: Payer: Medicare Other | Admitting: *Deleted

## 2011-09-26 ENCOUNTER — Ambulatory Visit: Payer: Medicare Other | Admitting: Cardiology

## 2011-09-26 VITALS — BP 136/80 | HR 80 | Ht 67.0 in | Wt 151.0 lb

## 2011-09-26 DIAGNOSIS — K589 Irritable bowel syndrome without diarrhea: Secondary | ICD-10-CM

## 2011-09-26 DIAGNOSIS — I951 Orthostatic hypotension: Secondary | ICD-10-CM

## 2011-09-26 DIAGNOSIS — Z8679 Personal history of other diseases of the circulatory system: Secondary | ICD-10-CM

## 2011-09-26 DIAGNOSIS — I119 Hypertensive heart disease without heart failure: Secondary | ICD-10-CM

## 2011-09-26 DIAGNOSIS — N183 Chronic kidney disease, stage 3 unspecified: Secondary | ICD-10-CM

## 2011-09-26 DIAGNOSIS — E7800 Pure hypercholesterolemia, unspecified: Secondary | ICD-10-CM

## 2011-09-26 LAB — CBC WITH DIFFERENTIAL/PLATELET
Basophils Absolute: 0.2 10*3/uL — ABNORMAL HIGH (ref 0.0–0.1)
HCT: 38.5 % (ref 36.0–46.0)
Hemoglobin: 12.8 g/dL (ref 12.0–15.0)
Lymphs Abs: 1.2 10*3/uL (ref 0.7–4.0)
MCHC: 33.2 g/dL (ref 30.0–36.0)
MCV: 90.3 fl (ref 78.0–100.0)
Monocytes Absolute: 0.6 10*3/uL (ref 0.1–1.0)
Neutro Abs: 6.9 10*3/uL (ref 1.4–7.7)
Platelets: 315 10*3/uL (ref 150.0–400.0)
RDW: 13.5 % (ref 11.5–14.6)

## 2011-09-26 LAB — HEPATIC FUNCTION PANEL
Albumin: 3.7 g/dL (ref 3.5–5.2)
Alkaline Phosphatase: 69 U/L (ref 39–117)
Bilirubin, Direct: 0 mg/dL (ref 0.0–0.3)
Total Protein: 6.5 g/dL (ref 6.0–8.3)

## 2011-09-26 LAB — BASIC METABOLIC PANEL
CO2: 20 mEq/L (ref 19–32)
Calcium: 8.6 mg/dL (ref 8.4–10.5)
Creatinine, Ser: 2 mg/dL — ABNORMAL HIGH (ref 0.4–1.2)
GFR: 25 mL/min — ABNORMAL LOW (ref >60.00)
Glucose, Bld: 82 mg/dL (ref 70–99)
Sodium: 139 mEq/L (ref 135–145)

## 2011-09-26 LAB — LIPID PANEL
HDL: 60.5 mg/dL (ref >39.00)
Triglycerides: 244 mg/dL — ABNORMAL HIGH (ref 0.0–149.0)
VLDL: 48.8 mg/dL — ABNORMAL HIGH (ref 0.0–40.0)

## 2011-09-26 NOTE — Progress Notes (Signed)
Nigel Bridgeman Date of Birth:  04-May-1924 [facility] [address] [place], [place]  [address] [phone]         Fax   [phone]  History of Present Illness: This pleasant 78 year old woman is seen for a scheduled followup office visit.  She is a past history of essential hypertension as well as a history of orthostatic hypotension and dizzy spells.  Since her last saw her she had an episode of near syncope at church on [place] 20 and had to be taken by EMS to the emergency room where she was checked and no cause of the syncope was found.  Current Outpatient Prescriptions  Medication Sig Dispense Refill  . estrogens, conjugated, (PREMARIN) 0.625 MG tablet Take 0.625 mg by mouth daily.      . hydrochlorothiazide (HYDRODIURIL) 25 MG tablet Take 12.5 mg by mouth daily.      . Multiple Vitamin (MULITIVITAMIN WITH MINERALS) TABS Take 1 tablet by mouth daily.      . Multiple Vitamins-Minerals (ICAPS PO) Take by mouth daily.          Allergies  Allergen Reactions  . Codeine Other (See Comments)    unknown  . Lipitor (Atorvastatin Calcium) Other (See Comments)    unknown  . Lisinopril Other (See Comments)    unknown  . Norvasc (Amlodipine Besylate) Other (See Comments)    unknown  . Red Yeast Rice Other (See Comments)    unknown  . Zetia (Ezetimibe) Other (See Comments)    unknown    Patient Active Problem List  Diagnoses  . History of orthostatic hypotension  . Irritable bowel syndrome  . Postmenopausal state  . Vertigo  . Pure hypercholesterolemia    History  Smoking status  . Former Smoker  . Quit date: 02/14/1971  Smokeless tobacco  . Not on file    History  Alcohol Use No    Family History  Problem Relation Age of Onset  . Stroke Father     Review of Systems: Constitutional: no fever chills diaphoresis or fatigue or change in weight.  Head and neck: no hearing loss, no epistaxis, no photophobia or visual  disturbance. Respiratory: No cough, shortness of breath or wheezing. Cardiovascular: No chest pain peripheral edema, palpitations. Gastrointestinal: No abdominal distention, no abdominal pain, no change in bowel habits hematochezia or melena. Genitourinary: No dysuria, no frequency, no urgency, no nocturia. Musculoskeletal:No arthralgias, no back pain, no gait disturbance or myalgias. Neurological: No dizziness, no headaches, no numbness, no seizures, no syncope, no weakness, no tremors. Hematologic: No lymphadenopathy, no easy bruising. Psychiatric: No confusion, no hallucinations, no sleep disturbance.    Physical Exam: Filed Vitals:   09/26/11 1105  BP: 136/80  Pulse: 80   the general appearance reveals an alert elderly woman in no distress.  Pupils equal and reactive.   Extraocular Movements are full.  There is no scleral icterus.  The mouth and pharynx are normal.  The neck is supple.  The carotids reveal no bruits.  The jugular venous pressure is normal.  The thyroid is not enlarged.  There is no lymphadenopathy.  The chest is clear to percussion and auscultation. There are no rales or rhonchi. Expansion of the chest is symmetrical.  The precordium is quiet.  The first heart sound is normal.  The second heart sound is physiologically split.  There is no murmur gallop rub or click.  There is no abnormal lift or heave.  The abdomen is soft and nontender.  Bowel sounds are normal. The liver and spleen are not enlarged. There Are no abdominal masses. There are no bruits.  The pedal pulses are good.  There is no phlebitis or edema.  There is no cyanosis or clubbing. Strength is normal and symmetrical in all extremities.  There is no lateralizing weakness.  There are no sensory deficits.  The skin is warm and dry.  There is no rash.    Assessment / Plan: Continue same medication.  Recheck in 4 months for office visit and CBC and fasting lipid panel hepatic function panel and basal  metabolic panel

## 2011-09-26 NOTE — Patient Instructions (Signed)
Will obtain labs today and call you with the results(lp/hfp/cbc/bmet)  Your physician recommends that you continue on your current medications as directed. Please refer to the Current Medication list given to you today.  Your physician recommends that you schedule a follow-up appointment in: 4 months with fasting labs (lp/bmet/hfp/cbc)

## 2011-09-26 NOTE — Assessment & Plan Note (Signed)
The patient has a past history of hypercholesterolemia.  She is not on any statin therapy.  In the past she was unable to take Zetia or Lipitor causes leg pain.  We are checking lipids today.

## 2011-09-26 NOTE — Assessment & Plan Note (Signed)
Patient has a past history of irritable bowel syndrome.  Recently she has not been having any extremes of diarrhea or constipation and bowel 7 relatively normal for her

## 2011-09-26 NOTE — Assessment & Plan Note (Signed)
Since Her episode of January 20 she has not had any further episodes of severe dizziness or syncope she feels overall that her balance has improved since starting to receive physical therapy.

## 2011-09-28 ENCOUNTER — Telehealth: Payer: Self-pay | Admitting: *Deleted

## 2011-09-28 ENCOUNTER — Other Ambulatory Visit: Payer: Self-pay | Admitting: Cardiology

## 2011-09-28 NOTE — Telephone Encounter (Signed)
I checked the patient's right antecubital fossa.  She has a small area of ecchymosis the size of a silver dollar.  There is no evidence of infection.  The underlying brachial artery is intact.  She has good radial pulse.  The patient was reassured.

## 2011-09-28 NOTE — Telephone Encounter (Signed)
Patient came and wanted for  Dr. Patty Sermons to look at her arm where she had venipuncture on 09/26/11.  He did report her labs

## 2011-12-12 ENCOUNTER — Telehealth: Payer: Self-pay | Admitting: Cardiology

## 2011-12-12 NOTE — Telephone Encounter (Signed)
New msg Pt's husband called He said she has been fatigued and  dizzy. No chest pain or sob. He wants to talk to a nurse.

## 2011-12-12 NOTE — Telephone Encounter (Signed)
Feels weak and like she is going to faint.  No energy and has been feeling bad for a few weeks

## 2011-12-12 NOTE — Telephone Encounter (Signed)
Scheduled appointment for her to see  Dr. Patty Sermons in am.  Advised to go to emergency department if worse

## 2011-12-13 ENCOUNTER — Telehealth: Payer: Self-pay | Admitting: Cardiology

## 2011-12-13 ENCOUNTER — Encounter: Payer: Self-pay | Admitting: Cardiology

## 2011-12-13 ENCOUNTER — Other Ambulatory Visit: Payer: Self-pay | Admitting: *Deleted

## 2011-12-13 ENCOUNTER — Ambulatory Visit (INDEPENDENT_AMBULATORY_CARE_PROVIDER_SITE_OTHER): Payer: Medicare Other | Admitting: Cardiology

## 2011-12-13 VITALS — BP 182/99 | HR 87 | Ht 67.0 in | Wt 154.0 lb

## 2011-12-13 DIAGNOSIS — I119 Hypertensive heart disease without heart failure: Secondary | ICD-10-CM

## 2011-12-13 DIAGNOSIS — R42 Dizziness and giddiness: Secondary | ICD-10-CM | POA: Insufficient documentation

## 2011-12-13 DIAGNOSIS — I10 Essential (primary) hypertension: Secondary | ICD-10-CM

## 2011-12-13 DIAGNOSIS — K589 Irritable bowel syndrome without diarrhea: Secondary | ICD-10-CM

## 2011-12-13 MED ORDER — METOPROLOL SUCCINATE ER 25 MG PO TB24
ORAL_TABLET | ORAL | Status: DC
Start: 2011-12-13 — End: 2011-12-20

## 2011-12-13 MED ORDER — AMLODIPINE BESYLATE 2.5 MG PO TABS: 2.5000 mg | ORAL_TABLET | Freq: Every day | ORAL | Status: DC

## 2011-12-13 NOTE — Progress Notes (Signed)
Nigel Bridgeman Date of Birth:  05-08-1924 [facility] [address] [place], [place]  [address] [phone]         Fax   [phone]  History of Present Illness: This pleasant 78 year old woman is seen as a work in an office visit.  Yesterday she was in the grocery store and had an episode of near syncope while shopping.  She had only been there a few minutes when she felt suddenly dizzy.  She felt that the room was spinning around.  She did not have any nausea or diaphoresis or chest pain.  There was no place to sit down so she just leaned against the shopping cart and after a few minutes the symptoms cleared up and did not recur.  It left her feeling weak for the rest of the day.  The patient does have a past history of hypertension and chronic renal disease stage III she also has a history of hypercholesterolemia essential hypertension and orthostatic hypotension and irritable bowel syndrome.  She has a past history of hypercholesterolemia but has been intolerant of statins or other lipid lowering drugs she had a normal nuclear stress test by Dr. Jenne Campus in 2003.  She has a history of a remote echocardiogram by Dr. Lucas Mallow which was unremarkable.  Current Outpatient Prescriptions  Medication Sig Dispense Refill  . estrogens, conjugated, (PREMARIN) 0.625 MG tablet Take 0.625 mg by mouth daily.      . hydrochlorothiazide (HYDRODIURIL) 25 MG tablet TAKE ONE-HALF (1/2) TABLET DAILY  45 tablet  3  . Multiple Vitamin (MULITIVITAMIN WITH MINERALS) TABS Take 1 tablet by mouth daily.      . Multiple Vitamins-Minerals (ICAPS PO) Take by mouth daily.        Marland Kitchen amLODipine (NORVASC) 2.5 MG tablet Take 1 tablet (2.5 mg total) by mouth daily.  30 tablet  3    Allergies  Allergen Reactions  . Codeine Other (See Comments)    unknown  . Lipitor (Atorvastatin Calcium) Other (See Comments)    unknown  . Lisinopril Other (See Comments)    unknown  . Red Yeast Rice Other (See  Comments)    unknown  . Zetia (Ezetimibe) Other (See Comments)    unknown    Patient Active Problem List  Diagnoses  . History of orthostatic hypotension  . Irritable bowel syndrome  . Postmenopausal state  . Vertigo  . Pure hypercholesterolemia  . Benign hypertensive heart disease without heart failure  . Dizziness - light-headed  . Chronic renal insufficiency, stage III (moderate)    History  Smoking status  . Former Smoker  . Quit date: 02/14/1971  Smokeless tobacco  . Not on file    History  Alcohol Use No    Family History  Problem Relation Age of Onset  . Stroke Father     Review of Systems: Constitutional: no fever chills diaphoresis or fatigue or change in weight.  Head and neck: no hearing loss, no epistaxis, no photophobia or visual disturbance. Respiratory: No cough, shortness of breath or wheezing. Cardiovascular: No chest pain peripheral edema, palpitations. Gastrointestinal: No abdominal distention, no abdominal pain, no change in bowel habits hematochezia or melena. Genitourinary: No dysuria, no frequency, no urgency, no nocturia. Musculoskeletal:No arthralgias, no back pain, no gait disturbance or myalgias. Neurological: No dizziness, no headaches, no numbness, no seizures, no syncope, no weakness, no tremors. Hematologic: No lymphadenopathy, no easy bruising. Psychiatric: No confusion, no hallucinations, no sleep disturbance.    Physical Exam: Ceasar Mons  Vitals:   12/13/11 1100  BP: 182/99  Pulse: 87   repeat blood pressure by me was 172/90.  General appearance reveals a well-developed well-nourished elderly woman in no acute distress.Pupils equal and reactive.   Extraocular Movements are full.  There is no scleral icterus.  The mouth and pharynx are normal.  The neck is supple.  The carotids reveal no bruits.  The jugular venous pressure is normal.  The thyroid is not enlarged.  There is no lymphadenopathy.  The chest is clear to percussion and  auscultation. There are no rales or rhonchi. Expansion of the chest is symmetrical.  The precordium is quiet.  The first heart sound is normal.  The second heart sound is physiologically split.  There is no murmur gallop rub or click.  There is no abnormal lift or heave.  The abdomen is soft and nontender. Bowel sounds are normal. The liver and spleen are not enlarged. There Are no abdominal masses. There are no bruits.  The pedal pulses are good.  There is no phlebitis or edema.  There is no cyanosis or clubbing. Strength is normal and symmetrical in all extremities.  There is no lateralizing weakness.  There are no sensory deficits.  EKG today shows normal sinus rhythm and is within normal limits   Assessment / Plan: Gen. hydrochlorothiazide.  Add amlodipine.  Recheck in one to 2 weeks for followup office visit and to get basal metabolic panel and CBC then.  Also consider two-dimensional echocardiogram to evaluate further.

## 2011-12-13 NOTE — Telephone Encounter (Signed)
Has take Amlodipine before and she ended up in the hospital, she forgot about it.  Will forward to  Dr. Patty Sermons

## 2011-12-13 NOTE — Assessment & Plan Note (Signed)
Her blood pressure today is elevated.  I checked it myself later in the exam and her blood pressure was 172/90.  She has been on hydrochlorothiazide 25 mg daily as her only blood pressure medication.  We will add amlodipine 2.5 mg one daily.  In the past she has had some mild pedal edema from amlodipine and I warned her that this might happen again.  We will avoid ACE inhibitors because of her chronic kidney disease.

## 2011-12-13 NOTE — Assessment & Plan Note (Signed)
The patient has not been having any symptoms of dysuria or kidney pain.  She does have moderate renal insufficiency by prior lab work

## 2011-12-13 NOTE — Assessment & Plan Note (Signed)
The patient had an [place] of [place]ope [place] church [place] and was taken by EMS to the emergency room where she was checked out and no cause of the syncope was found.  Yesterday's episode did have some element of vertigo to it with a sensation that the room was spinning around.  Her blood pressure is significantly elevated today upright and supine and we will work at this point to bring her blood pressure back into normal range and see if the dizziness improves

## 2011-12-13 NOTE — Telephone Encounter (Signed)
Advised patient

## 2011-12-13 NOTE — Assessment & Plan Note (Signed)
She states that she has not had any recent irritable bowel syndrome symptoms.

## 2011-12-13 NOTE — Telephone Encounter (Signed)
Stop Norvasc.  Prescribe Toprol XL 25 mg and take one half tablet daily.  Get another EKG when she returns for her next office visit.  Label chart allergic to Norvasc

## 2011-12-13 NOTE — Telephone Encounter (Signed)
Please return call to patient regarding possible substitution for Amlodipine RX, she can be reached [phone].

## 2011-12-13 NOTE — Patient Instructions (Signed)
Your physician has recommended you:      Start amlodipine 2.5mg  once a day   We will send the prescription over to your pharmacy    We will see you back on June 10 for an office visit and labs   Stay on current medications

## 2011-12-20 ENCOUNTER — Telehealth: Payer: Self-pay | Admitting: Cardiology

## 2011-12-20 DIAGNOSIS — I119 Hypertensive heart disease without heart failure: Secondary | ICD-10-CM

## 2011-12-20 MED ORDER — METOPROLOL SUCCINATE ER 25 MG PO TB24: 25.0000 mg | ORAL_TABLET | Freq: Two times a day (BID) | ORAL | Status: DC

## 2011-12-20 NOTE — Telephone Encounter (Signed)
BROWN [facility]ARDNIER [phone] CALLING RE PT SAYING THERE WAS A CHANGE IN MED FOR METOPROLOL AFTER OFFICE VISIT AND THEY HAVE NOT BEEN NOTIFIED OF ANY CHANGE, PLS CALL

## 2011-12-20 NOTE — Telephone Encounter (Signed)
Husband brought in patients blood pressure readings and they are ranging from 157-198/84-106.  Readings reviewed by  Dr. Patty Sermons and will have patient increase her Toprol from 25 mg daily to twice a day and to hold second dose if systolic less than 130.  Advised husband and sent Rx to pharmacy.  Did advise to call back with update

## 2011-12-26 ENCOUNTER — Telehealth: Payer: Self-pay | Admitting: Cardiology

## 2011-12-26 NOTE — Telephone Encounter (Signed)
Tried to call patient back before leaving to see how her blood pressure and answering machine picked up.  Left message to call in the am and let us know how she was doing.  Spoke with patient and advised for her to go ahead and take her afternoon dose of Toprol now and if blood pressure didn't come down within about an hour and a half to go urgent care take her blood pressure machine with her. Patient states she has no pain and just feels washed out, no energy.

## 2011-12-26 NOTE — Telephone Encounter (Signed)
Spoke with patient first phone call around 5:30, call back around 6:30

## 2011-12-26 NOTE — Telephone Encounter (Signed)
Pt's bp 212/104 while on bp med, pls advise [phone] dale pilson

## 2011-12-28 ENCOUNTER — Telehealth: Payer: Self-pay | Admitting: Cardiology

## 2011-12-28 ENCOUNTER — Ambulatory Visit (INDEPENDENT_AMBULATORY_CARE_PROVIDER_SITE_OTHER): Payer: Medicare Other | Admitting: Nurse Practitioner

## 2011-12-28 ENCOUNTER — Encounter: Payer: Self-pay | Admitting: Nurse Practitioner

## 2011-12-28 VITALS — BP 142/68 | HR 60 | Ht 67.0 in | Wt 154.0 lb

## 2011-12-28 DIAGNOSIS — N2889 Other specified disorders of kidney and ureter: Secondary | ICD-10-CM

## 2011-12-28 DIAGNOSIS — N183 Chronic kidney disease, stage 3 unspecified: Secondary | ICD-10-CM

## 2011-12-28 DIAGNOSIS — I119 Hypertensive heart disease without heart failure: Secondary | ICD-10-CM

## 2011-12-28 MED ORDER — HYDRALAZINE HCL 25 MG PO TABS: 25.0000 mg | ORAL_TABLET | Freq: Two times a day (BID) | ORAL | Status: DC

## 2011-12-28 NOTE — Progress Notes (Signed)
Nigel Bridgeman Date of Birth: [birth year] Medical Record #[id number]  History of Present Illness: Nicole Harding is seen today for a work in visit. She is seen for Dr. Patty Sermons. She is 87. She has HTN with a history of orthostatic hypotension. Has multiple drug allergies. Has CKD, HLD and IBS. She was seen earlier this month and had Norvasc added back for her elevated blood pressure.  She comes in today. She is here with her husband. She says she just feels terrible. She is weak. No energy. Blood pressure is staying elevated. Her husband and son have been checking it. Their readings show it to be up to the 210 systolic range. She was given low dose Norvasc at her last visit. She did not take it. Says it has made her deathly sick in the past. They are concerned that the blood pressure is staying up and that she could have a stroke.   She denies any chest pain or shortness of breath. Not dizzy or lightheaded.   Current Outpatient Prescriptions on File Prior to Visit  Medication Sig Dispense Refill  . estrogens, conjugated, (PREMARIN) 0.625 MG tablet Take 0.625 mg by mouth daily.      . hydrochlorothiazide (HYDRODIURIL) 25 MG tablet TAKE ONE-HALF (1/2) TABLET DAILY  45 tablet  3  . metoprolol succinate (TOPROL XL) 25 MG 24 hr tablet Take 1 tablet (25 mg total) by mouth 2 (two) times daily.  60 tablet  5  . Multiple Vitamin (MULITIVITAMIN WITH MINERALS) TABS Take 1 tablet by mouth daily.      . Multiple Vitamins-Minerals (ICAPS PO) Take by mouth daily.        . hydrALAZINE (APRESOLINE) 25 MG tablet Take 1 tablet (25 mg total) by mouth 2 (two) times daily.  60 tablet  3    Allergies  Allergen Reactions  . Amlodipine     Swelling --sick and ended up in hospital N&V  . Codeine Other (See Comments)    unknown  . Lipitor (Atorvastatin Calcium) Other (See Comments)    unknown  . Lisinopril Other (See Comments)    unknown  . Red Yeast Rice Other (See Comments)    unknown  . Zetia (Ezetimibe)  Other (See Comments)    unknown    Past Medical History  Diagnosis Date  . Hypertension   . History of orthostatic hypotension   . IBS (irritable bowel syndrome)   . Parkinson's disease     ATYPICAL  . Dizziness   . Vertigo   . Fatigue   . Advanced age   . HLD (hyperlipidemia)     intolerant to lipid lowering drugs  . Normal nuclear stress test 2003  . CKD (chronic kidney disease)     Past Surgical History  Procedure Date  . Tonsillectomy   . Cholecystectomy   . Breast cyst removed   . Abdominal hysterectomy     History  Smoking status  . Former Smoker  . Quit date: 02/14/1971  Smokeless tobacco  . Not on file    History  Alcohol Use No    Family History  Problem Relation Age of Onset  . Stroke Father     Review of Systems: The review of systems is positive for fatigue.  All other systems were reviewed and are negative.  Physical Exam: BP 142/68  Pulse 60  Ht 5\' 7"  (1.702 m)  Wt 154 lb (69.854 kg)  BMI 24.12 kg/m2 Blood pressure by me is 160/80. Her automatic cuff said 194/110.  Patient is very elderly female who is pleasant and in no acute distress. Skin is warm and dry. Color is normal.  HEENT is unremarkable. Normocephalic/atraumatic. PERRL. Sclera are nonicteric. Neck is supple. No masses. No JVD. Lungs are clear. Cardiac exam shows a regular rate and rhythm. Abdomen is soft. Extremities are without edema. Gait and ROM are intact. No gross neurologic deficits noted.  LABORATORY DATA:   Chemistry      Component Value Date/Time   NA 139 09/26/2011 1148   K 3.6 09/26/2011 1148   CL 108 09/26/2011 1148   CO2 20 09/26/2011 1148   BUN 36* 09/26/2011 1148   CREATININE 2.0* 09/26/2011 1148      Component Value Date/Time   CALCIUM 8.6 09/26/2011 1148   ALKPHOS 69 09/26/2011 1148   AST 30 09/26/2011 1148   ALT 14 09/26/2011 1148   BILITOT 0.4 09/26/2011 1148        Assessment / Plan:

## 2011-12-28 NOTE — Assessment & Plan Note (Signed)
Would avoid ACE/ARB.

## 2011-12-28 NOTE — Telephone Encounter (Signed)
Patient husband called back today and did not want to go anywhere to be treated for her HTN except here.  Still having issues.  Worked in with Lawson Fiscal NP today

## 2011-12-28 NOTE — Telephone Encounter (Signed)
Worked in to see Lawson Fiscal NP today

## 2011-12-28 NOTE — Patient Instructions (Addendum)
We are going to add Hydralazine 25 mg to take two times a day.  Continue to monitor your blood pressure and keep a diary  We will see you back in June as scheduled with Dr. Patty Sermons.  Call the [facility] office at [phone] if you have any questions, problems or concerns.

## 2011-12-28 NOTE — Assessment & Plan Note (Addendum)
Even though her automatic cuff does not correlate, her blood pressure remains elevated. She does not wish to take the Norvasc. I have added Hydralazine 25 mg BID. They will continue to monitor at home. We will see her back at her regular appointment time in June. I have asked her to start a baby aspirin daily as well.  Patient is agreeable to this plan and will call if any problems develop in the interim.

## 2011-12-28 NOTE — Telephone Encounter (Signed)
Pt is still having an issue with elevated b/p and it was 212/94 yesterday and he would like help with this and he is very concerned about her having a stroke

## 2012-01-16 ENCOUNTER — Ambulatory Visit (INDEPENDENT_AMBULATORY_CARE_PROVIDER_SITE_OTHER): Payer: Medicare Other | Admitting: Cardiology

## 2012-01-16 ENCOUNTER — Other Ambulatory Visit (INDEPENDENT_AMBULATORY_CARE_PROVIDER_SITE_OTHER): Payer: Medicare Other

## 2012-01-16 ENCOUNTER — Encounter: Payer: Self-pay | Admitting: Cardiology

## 2012-01-16 VITALS — BP 158/90 | HR 60 | Ht 67.0 in | Wt 155.0 lb

## 2012-01-16 DIAGNOSIS — R42 Dizziness and giddiness: Secondary | ICD-10-CM

## 2012-01-16 DIAGNOSIS — Z780 Asymptomatic menopausal state: Secondary | ICD-10-CM

## 2012-01-16 DIAGNOSIS — K589 Irritable bowel syndrome without diarrhea: Secondary | ICD-10-CM

## 2012-01-16 DIAGNOSIS — I119 Hypertensive heart disease without heart failure: Secondary | ICD-10-CM

## 2012-01-16 DIAGNOSIS — E7800 Pure hypercholesterolemia, unspecified: Secondary | ICD-10-CM

## 2012-01-16 DIAGNOSIS — I10 Essential (primary) hypertension: Secondary | ICD-10-CM

## 2012-01-16 DIAGNOSIS — N959 Unspecified menopausal and perimenopausal disorder: Secondary | ICD-10-CM

## 2012-01-16 LAB — CBC WITH DIFFERENTIAL/PLATELET
Basophils Absolute: 0.1 10*3/uL (ref 0.0–0.1)
Eosinophils Absolute: 0.2 10*3/uL (ref 0.0–0.7)
HCT: 39.8 % (ref 36.0–46.0)
Lymphs Abs: 1.3 10*3/uL (ref 0.7–4.0)
MCV: 91.3 fl (ref 78.0–100.0)
Monocytes Absolute: 0.7 10*3/uL (ref 0.1–1.0)
Neutrophils Relative %: 72.6 % (ref 43.0–77.0)
Platelets: 279 10*3/uL (ref 150.0–400.0)
RDW: 14.3 % (ref 11.5–14.6)
WBC: 8.5 10*3/uL (ref 4.5–10.5)

## 2012-01-16 MED ORDER — ESTROGENS CONJUGATED 0.3 MG PO TABS: 0.3000 mg | ORAL_TABLET | Freq: Every day | ORAL | Status: DC

## 2012-01-16 MED ORDER — HYDRALAZINE HCL 25 MG PO TABS: 25.0000 mg | ORAL_TABLET | Freq: Three times a day (TID) | ORAL | Status: DC

## 2012-01-16 NOTE — Assessment & Plan Note (Signed)
She brought in a copy of her home blood pressure readings which are improved but are still too high.  We're going to increase her hydralazine up to 25 mg 3 times a day.  She will continue to check her blood pressure at home.

## 2012-01-16 NOTE — Assessment & Plan Note (Signed)
Her symptoms of dizziness have improved since her blood pressure is under better control

## 2012-01-16 NOTE — Patient Instructions (Signed)
Will obtain labs today and call you with the results  Increase your Hydralazine to 25 mg three times a day  Change your Premarin to 0.3 mg daily  Your physician recommends that you schedule a follow-up appointment in: 3 months ov/ekg

## 2012-01-16 NOTE — Progress Notes (Signed)
Nicole Harding Date of Birth:  [birth year] [facility] [address] [place], [place]  [address] [phone]         Fax   [phone]  History of Present Illness: This pleasant 78 year old woman is seen for a scheduled followup office visit.  She has a past history of high blood pressure associated with lightheadedness and dizziness.  She has a history of irritable bowel syndrome and history of chronic renal insufficiency.  She does not have any history of ischemic heart disease and she had a normal nuclear stress test in 2003 by Dr. Jenne Campus.  She has a history of a remote echocardiogram by Dr. Guadlupe Spanish which was unremarkable.  Since her last saw her she was placed on hydralazine for poorly controlled blood pressure.  She is on 25 mg twice a day.  The patient has a history of postmenopausal state with a bothersome night sweats and hot flashes.  She has had a hysterectomy and she gets regular mammograms.  Current Outpatient Prescriptions  Medication Sig Dispense Refill  . aspirin 81 MG tablet Take 81 mg by mouth daily.      Marland Kitchen estrogens, conjugated, (PREMARIN) 0.3 MG tablet Take 1 tablet (0.3 mg total) by mouth daily.  90 tablet  3  . hydrALAZINE (APRESOLINE) 25 MG tablet Take 1 tablet (25 mg total) by mouth 3 (three) times daily.  270 tablet  3  . hydrochlorothiazide (HYDRODIURIL) 25 MG tablet TAKE ONE-HALF (1/2) TABLET DAILY  45 tablet  3  . metoprolol succinate (TOPROL XL) 25 MG 24 hr tablet Take 1 tablet (25 mg total) by mouth 2 (two) times daily.  60 tablet  5  . Multiple Vitamin (MULITIVITAMIN WITH MINERALS) TABS Take 1 tablet by mouth daily.      . Multiple Vitamins-Minerals (ICAPS PO) Take by mouth daily.        Marland Kitchen DISCONTD: estrogens, conjugated, (PREMARIN) 0.625 MG tablet Take 0.625 mg by mouth daily.      Marland Kitchen DISCONTD: hydrALAZINE (APRESOLINE) 25 MG tablet Take 1 tablet (25 mg total) by mouth 2 (two) times daily.  60 tablet  3    Allergies  Allergen  Reactions  . Amlodipine     Swelling --sick and ended up in hospital N&V  . Codeine Other (See Comments)    unknown  . Lipitor (Atorvastatin Calcium) Other (See Comments)    unknown  . Lisinopril Other (See Comments)    unknown  . Red Yeast Rice Other (See Comments)    unknown  . Zetia (Ezetimibe) Other (See Comments)    unknown    Patient Active Problem List  Diagnoses  . History of orthostatic hypotension  . Irritable bowel syndrome  . Postmenopausal state  . Vertigo  . Pure hypercholesterolemia  . Benign hypertensive heart disease without heart failure  . Dizziness - light-headed  . Chronic renal insufficiency, stage III (moderate)    History  Smoking status  . Former Smoker  . Quit date: 02/14/1971  Smokeless tobacco  . Not on file    History  Alcohol Use No    Family History  Problem Relation Age of Onset  . Stroke Father     Review of Systems: Constitutional: no fever chills diaphoresis or fatigue or change in weight.  Head and neck: no hearing loss, no epistaxis, no photophobia or visual disturbance. Respiratory: No cough, shortness of breath or wheezing. Cardiovascular: No chest pain peripheral edema, palpitations. Gastrointestinal: No abdominal distention, no abdominal pain, no change  in bowel habits hematochezia or melena. Genitourinary: No dysuria, no frequency, no urgency, no nocturia. Musculoskeletal:No arthralgias, no back pain, no gait disturbance or myalgias. Neurological: No dizziness, no headaches, no numbness, no seizures, no syncope, no weakness, no tremors. Hematologic: No lymphadenopathy, no easy bruising. Psychiatric: No confusion, no hallucinations, no sleep disturbance.    Physical Exam: Filed Vitals:   01/16/12 0929  BP: 158/90  Pulse: 60   the general appearance reveals a well-developed well-nourished woman in no distress.The head and neck exam reveals pupils equal and reactive.  Extraocular movements are full.  There is no  scleral icterus.  The mouth and pharynx are normal.  The neck is supple.  The carotids reveal no bruits.  The jugular venous pressure is normal.  The  thyroid is not enlarged.  There is no lymphadenopathy.  The chest is clear to percussion and auscultation.  There are no rales or rhonchi.  Expansion of the chest is symmetrical.  The precordium is quiet.  The first heart sound is normal.  The second heart sound is physiologically split.  There is no murmur gallop rub or click.  There is no abnormal lift or heave.  The abdomen is soft and nontender.  The bowel sounds are normal.  The liver and spleen are not enlarged.  There are no abdominal masses.  There are no abdominal bruits.  Extremities reveal good pedal pulses.  There is no phlebitis or edema.  There is no cyanosis or clubbing.  Strength is normal and symmetrical in all extremities.  There is no lateralizing weakness.  There are no sensory deficits.  The skin is warm and dry.  There is no rash.     Assessment / Plan: We are checking blood work today.  She will continue same medication except increase hydralazine to 25 mg 3 times a day.  Recheck in 3 months for followup office visit and EKG.  We have reduce the dose of her Premarin to 0.3 mg daily

## 2012-01-16 NOTE — Assessment & Plan Note (Signed)
She has a history of hot flashes.  She has been taking Premarin 0.625 mg daily.  To try to decrease the risk of breast cancer we are decreasing her dose of Premarin to 0.3 mg daily.  She has tried to do without the Premarin but is unable to tolerate the hot flashes and night sweats.  She understands that long-term use of Premarin does increase her risk of breast cancer.

## 2012-01-16 NOTE — Progress Notes (Signed)
Quick Note:  Please report to patient. The recent labs are stable. Continue same medication and careful diet. ______ 

## 2012-01-16 NOTE — Assessment & Plan Note (Signed)
Her symptoms of irritable bowel syndrome have been stable

## 2012-01-17 ENCOUNTER — Telehealth: Payer: Self-pay | Admitting: *Deleted

## 2012-01-17 NOTE — Telephone Encounter (Signed)
Message copied by Burnell Blanks on Tue Jan 17, 2012  3:06 PM ------      Message from: [name]      Created: Mon Jan 16, 2012  9:39 PM       Please report to patient.  The recent labs are stable. Continue same medication and careful diet.

## 2012-01-17 NOTE — Telephone Encounter (Signed)
We should have her come in for the missing labs.

## 2012-01-17 NOTE — Telephone Encounter (Signed)
Advised patient of lab results  Patient was to have lp/bmet/hfp done however Nicole Harding was unable to see orders in Epic.  Will forward to  Dr. Patty Sermons to see if he wants them done before her office visit in September or ok to wait

## 2012-01-31 ENCOUNTER — Ambulatory Visit (INDEPENDENT_AMBULATORY_CARE_PROVIDER_SITE_OTHER): Payer: Medicare Other | Admitting: *Deleted

## 2012-01-31 ENCOUNTER — Telehealth: Payer: Self-pay | Admitting: Cardiology

## 2012-01-31 DIAGNOSIS — I119 Hypertensive heart disease without heart failure: Secondary | ICD-10-CM

## 2012-01-31 DIAGNOSIS — E7800 Pure hypercholesterolemia, unspecified: Secondary | ICD-10-CM

## 2012-01-31 DIAGNOSIS — Z8679 Personal history of other diseases of the circulatory system: Secondary | ICD-10-CM

## 2012-01-31 LAB — CBC WITH DIFFERENTIAL/PLATELET
Basophils Relative: 1.9 % (ref 0.0–3.0)
Eosinophils Absolute: 0.2 10*3/uL (ref 0.0–0.7)
Lymphocytes Relative: 15.6 % (ref 12.0–46.0)
MCHC: 33 g/dL (ref 30.0–36.0)
Monocytes Relative: 6.4 % (ref 3.0–12.0)
Neutrophils Relative %: 74.7 % (ref 43.0–77.0)
RBC: 4.65 Mil/uL (ref 3.87–5.11)
WBC: 11.2 10*3/uL — ABNORMAL HIGH (ref 4.5–10.5)

## 2012-01-31 LAB — LIPID PANEL
HDL: 63.7 mg/dL (ref >39.00)
Total CHOL/HDL Ratio: 4
VLDL: 81.8 mg/dL — ABNORMAL HIGH (ref 0.0–40.0)

## 2012-01-31 LAB — BASIC METABOLIC PANEL
BUN: 36 mg/dL — ABNORMAL HIGH (ref 6–23)
CO2: 19 mEq/L (ref 19–32)
Chloride: 107 mEq/L (ref 96–112)
GFR: 25.57 mL/min — ABNORMAL LOW (ref >60.00)
Glucose, Bld: 89 mg/dL (ref 70–99)
Potassium: 3.9 mEq/L (ref 3.5–5.1)
Sodium: 137 mEq/L (ref 135–145)

## 2012-01-31 LAB — HEPATIC FUNCTION PANEL
Bilirubin, Direct: 0.1 mg/dL (ref 0.0–0.3)
Total Bilirubin: 0.5 mg/dL (ref 0.3–1.2)

## 2012-01-31 NOTE — Progress Notes (Signed)
Quick Note:  Please report to patient. The recent labs are stable. Continue same medication and careful diet.The potassium is okay. TGs are higher so she needs to decrease carbs and sweets. Kidney function is stable. Cholesterol is higher--watch diet. ______

## 2012-02-01 NOTE — Telephone Encounter (Signed)
Patient came for labs. 

## 2012-02-06 ENCOUNTER — Telehealth: Payer: Self-pay | Admitting: *Deleted

## 2012-02-06 NOTE — Telephone Encounter (Signed)
Message copied by Burnell Blanks on Mon Feb 06, 2012  5:03 PM ------      Message from: [name]      Created: Tue Jan 31, 2012  6:21 PM       Please report to patient.  The recent labs are stable. Continue same medication and careful diet.The potassium is okay.  TGs are higher so she needs to decrease carbs and sweets. Kidney function is stable. Cholesterol is higher--watch diet.

## 2012-02-06 NOTE — Telephone Encounter (Signed)
Advised of labs 

## 2012-04-17 ENCOUNTER — Encounter: Payer: Self-pay | Admitting: Nurse Practitioner

## 2012-04-17 ENCOUNTER — Ambulatory Visit (INDEPENDENT_AMBULATORY_CARE_PROVIDER_SITE_OTHER): Payer: Medicare Other | Admitting: Nurse Practitioner

## 2012-04-17 VITALS — BP 130/60 | HR 51 | Ht 66.5 in | Wt 151.2 lb

## 2012-04-17 DIAGNOSIS — I10 Essential (primary) hypertension: Secondary | ICD-10-CM

## 2012-04-17 LAB — CBC WITH DIFFERENTIAL/PLATELET
Basophils Absolute: 0 10*3/uL (ref 0.0–0.1)
Basophils Relative: 0.5 % (ref 0.0–3.0)
Eosinophils Absolute: 0.2 10*3/uL (ref 0.0–0.7)
Eosinophils Relative: 2.4 % (ref 0.0–5.0)
HCT: 39.8 % (ref 36.0–46.0)
Hemoglobin: 13.1 g/dL (ref 12.0–15.0)
Lymphocytes Relative: 22 % (ref 12.0–46.0)
Lymphs Abs: 1.7 10*3/uL (ref 0.7–4.0)
MCHC: 33 g/dL (ref 30.0–36.0)
MCV: 90.6 fl (ref 78.0–100.0)
Monocytes Absolute: 0.5 10*3/uL (ref 0.1–1.0)
Monocytes Relative: 6.5 % (ref 3.0–12.0)
Neutro Abs: 5.4 10*3/uL (ref 1.4–7.7)
Neutrophils Relative %: 68.6 % (ref 43.0–77.0)
Platelets: 321 10*3/uL (ref 150.0–400.0)
RBC: 4.39 Mil/uL (ref 3.87–5.11)
RDW: 13.7 % (ref 11.5–14.6)
WBC: 7.8 10*3/uL (ref 4.5–10.5)

## 2012-04-17 LAB — BASIC METABOLIC PANEL
BUN: 39 mg/dL — ABNORMAL HIGH (ref 6–23)
CO2: 23 mEq/L (ref 19–32)
Calcium: 9.1 mg/dL (ref 8.4–10.5)
Chloride: 107 mEq/L (ref 96–112)
Creatinine, Ser: 1.8 mg/dL — ABNORMAL HIGH (ref 0.4–1.2)
GFR: 27.65 mL/min — ABNORMAL LOW (ref >60.00)
Glucose, Bld: 86 mg/dL (ref 70–99)
Potassium: 4.2 mEq/L (ref 3.5–5.1)
Sodium: 139 mEq/L (ref 135–145)

## 2012-04-17 MED ORDER — FLUOXETINE HCL 40 MG PO CAPS: 40.0000 mg | ORAL_CAPSULE | Freq: Every day | ORAL | Status: DC

## 2012-04-17 NOTE — Progress Notes (Signed)
Nigel Bridgeman Date of Birth: Sep 27, 1923 Medical Record #[id number]  History of Present Illness: Nicole Harding is seen back today for her 3 month check. She is seen for Dr. Patty Sermons. She has HTN, IBS and CRI. She has no known ischemic heart disease and has had a normal nuclear stress test back in 2003 per Dr. Jenne Campus. She has had a history of a remote echo by Dr. Lucas Mallow which was unremarkable as well. She has had a history of postmenopausal state and remains on low dose HRT. Her Hydralazine was increased at her last visit.   She comes in today. She is here alone. She is doing well. No real cardiac issues. She has more issues with her balance. No falls. Using her cane. She says her blood pressure has been doing better since the increase in the hydralazine. No chest pain. Not dizzy. No syncope. She feels like she is doing pretty well overall. She is expecting a great grand daughter any day. She stopped her Prozac since her last visit here and would like to restart it. She does not really remember why she stopped it but now feels like she needs it.   Current Outpatient Prescriptions on File Prior to Visit  Medication Sig Dispense Refill  . aspirin 81 MG tablet Take 81 mg by mouth daily.      Marland Kitchen estrogens, conjugated, (PREMARIN) 0.3 MG tablet Take 1 tablet (0.3 mg total) by mouth daily.  90 tablet  3  . hydrALAZINE (APRESOLINE) 25 MG tablet Take 1 tablet (25 mg total) by mouth 3 (three) times daily.  270 tablet  3  . hydrochlorothiazide (HYDRODIURIL) 25 MG tablet TAKE ONE-HALF (1/2) TABLET DAILY  45 tablet  3  . metoprolol succinate (TOPROL XL) 25 MG 24 hr tablet Take 1 tablet (25 mg total) by mouth 2 (two) times daily.  60 tablet  5  . Multiple Vitamin (MULITIVITAMIN WITH MINERALS) TABS Take 1 tablet by mouth daily.      . Multiple Vitamins-Minerals (ICAPS PO) Take by mouth daily.        Marland Kitchen FLUoxetine (PROZAC) 40 MG capsule Take 1 capsule (40 mg total) by mouth daily.  90 capsule  3     Allergies  Allergen Reactions  . Amlodipine     Swelling --sick and ended up in hospital N&V  . Codeine Other (See Comments)    unknown  . Lipitor (Atorvastatin Calcium) Other (See Comments)    unknown  . Lisinopril Other (See Comments)    unknown  . Red Yeast Rice Other (See Comments)    unknown  . Zetia (Ezetimibe) Other (See Comments)    unknown    Past Medical History  Diagnosis Date  . Hypertension   . History of orthostatic hypotension   . IBS (irritable bowel syndrome)   . Parkinson's disease     ATYPICAL  . Dizziness   . Vertigo   . Fatigue   . Advanced age   . HLD (hyperlipidemia)     intolerant to lipid lowering drugs  . Normal nuclear stress test 2003  . CKD (chronic kidney disease)     Past Surgical History  Procedure Date  . Tonsillectomy   . Cholecystectomy   . Breast cyst removed   . Abdominal hysterectomy     History  Smoking status  . Former Smoker  . Quit date: 02/14/1971  Smokeless tobacco  . Not on file    History  Alcohol Use No    Family History  Problem Relation Age of Onset  . Stroke Father     Review of Systems: The review of systems is per the HPI.  All other systems were reviewed and are negative.  Physical Exam: BP 130/60  Pulse 51  Ht 5' 6.5" (1.689 m)  Wt 151 lb 3.2 oz (68.584 kg)  BMI 24.04 kg/m2 Her repeat BP by me looks good.  Patient is very pleasant and in no acute distress. Skin is warm and dry. Color is normal.  HEENT is unremarkable. Normocephalic/atraumatic. PERRL. Sclera are nonicteric. Neck is supple. No masses. No JVD. Lungs are clear. Cardiac exam shows a regular rate and rhythm. Heart rate is 56 by my exam. Abdomen is soft. Extremities are without edema. Gait and ROM are intact. No gross neurologic deficits noted.  LABORATORY DATA: EKG today shows sinus brady, 1st degree AV block. Rate is 51.     Chemistry      Component Value Date/Time   NA 137 01/31/2012 1148   K 3.9 01/31/2012 1148   CL  107 01/31/2012 1148   CO2 19 01/31/2012 1148   BUN 36* 01/31/2012 1148   CREATININE 2.0* 01/31/2012 1148      Component Value Date/Time   CALCIUM 8.8 01/31/2012 1148   ALKPHOS 67 01/31/2012 1148   AST 28 01/31/2012 1148   ALT 12 01/31/2012 1148   BILITOT 0.5 01/31/2012 1148     Lab Results  Component Value Date   CHOL 259* 01/31/2012   HDL 63.70 01/31/2012   LDLCALC 47 09/23/2011   LDLDIRECT 122.7 01/31/2012   TRIG 409.0 Triglyceride is over 400; calculations on Lipids are invalid.* 01/31/2012   CHOLHDL 4 01/31/2012    Lab Results  Component Value Date   WBC 11.2* 01/31/2012   HGB 13.9 01/31/2012   HCT 42.1 01/31/2012   MCV 90.7 01/31/2012   PLT 334.0 01/31/2012     Assessment / Plan:  1. HTN - Repeat BP by me looks good. She says her readings at home have been ok. No change with her current regimen.   2. HLD - statin intolerant. She is due for repeat lipids in December.  3. CRI - will check BMET today  4. Depression - she would like to restart her Prozac.   Overall, she seems to be doing very well. No change in her current regimen. We will check BMET and CBC today. I have restarted her Prozac that she was on previously. She would like to come back and see me in 3 months with fasting labs and repeat EKG. Encouraged her to keep using her cane. Patient is agreeable to this plan and will call if any problems develop in the interim.

## 2012-04-17 NOTE — Patient Instructions (Signed)
I have restarted your Prozac  Stay on your current medicines  We will check some lab today  Keep a check on your blood pressure at home  I will see you in 3 months with fasting labs and repeat EKG  Call the [facility] office at [phone] if you have any questions, problems or concerns.

## 2012-04-18 ENCOUNTER — Telehealth: Payer: Self-pay | Admitting: *Deleted

## 2012-04-18 NOTE — Telephone Encounter (Signed)
Pt notified of lab results.  Amanda Becker, CMA 

## 2012-04-18 NOTE — Telephone Encounter (Signed)
Message copied by Awilda Bill on Wed Apr 18, 2012  3:49 PM ------      Message from: Rosalio Macadamia      Created: Tue Apr 17, 2012 12:42 PM       Ok to report. Labs are satisfactory.

## 2012-05-08 DIAGNOSIS — Y92009 Unspecified place in unspecified non-institutional (private) residence as the place of occurrence of the external cause: Secondary | ICD-10-CM

## 2012-05-08 DIAGNOSIS — W19XXXA Unspecified fall, initial encounter: Secondary | ICD-10-CM

## 2012-05-08 HISTORY — DX: Unspecified place in unspecified non-institutional (private) residence as the place of occurrence of the external cause: W19.XXXA

## 2012-05-08 HISTORY — DX: Unspecified place in unspecified non-institutional (private) residence as the place of occurrence of the external cause: Y92.009

## 2012-05-28 ENCOUNTER — Ambulatory Visit: Payer: Medicare Other

## 2012-05-28 ENCOUNTER — Ambulatory Visit (INDEPENDENT_AMBULATORY_CARE_PROVIDER_SITE_OTHER): Payer: Medicare Other | Admitting: Emergency Medicine

## 2012-05-28 ENCOUNTER — Telehealth: Payer: Self-pay | Admitting: Cardiology

## 2012-05-28 VITALS — BP 131/76 | HR 96 | Temp 98.4°F | Resp 18 | Ht 65.0 in | Wt 148.8 lb

## 2012-05-28 DIAGNOSIS — S40019A Contusion of unspecified shoulder, initial encounter: Secondary | ICD-10-CM

## 2012-05-28 DIAGNOSIS — S7000XA Contusion of unspecified hip, initial encounter: Secondary | ICD-10-CM

## 2012-05-28 DIAGNOSIS — S40029A Contusion of unspecified upper arm, initial encounter: Secondary | ICD-10-CM

## 2012-05-28 NOTE — Telephone Encounter (Signed)
plz return call to patient husband at home #,  pt fell last thursdand has a knot on left hip w/big bruise, would like to see [facility], was not taken to ER.

## 2012-05-28 NOTE — Telephone Encounter (Signed)
Per  Dr. Patty Sermons advised husband to have her go to Urgent Care

## 2012-05-28 NOTE — Progress Notes (Signed)
[facility] [address], [place] [address] [phone]  Date:  05/28/2012   Name:  Nicole Harding   DOB:  1923-10-31   MRN:  [id number]  PCP:  [name], MD    Chief Complaint: Hip Pain and Shoulder Pain   History of Present Illness:  Nicole Harding is a 78 y.o. very pleasant female patient who presents with the following:  Thursday was walking out of a closet and tripped on the irregular carpet edge and fell against a rocking chair.  Landed on left side and has persistent pain in left shoulder and left hip.  Associated with extensive bruising of the hip.  She is able to use her walker and cane in the left hand without interference and bear weight on her left leg.  Absolutely denies syncope, dizziness, weakness or other cardiovascular or neurological event related to her fall.  Denies any other complaint related to her fall.  Patient Active Problem List  Diagnosis  . History of orthostatic hypotension  . Irritable bowel syndrome  . Postmenopausal state  . Vertigo  . Pure hypercholesterolemia  . Benign hypertensive heart disease without heart failure  . Dizziness - light-headed  . Chronic renal insufficiency, stage III (moderate)    Past Medical History  Diagnosis Date  . Hypertension   . History of orthostatic hypotension   . IBS (irritable bowel syndrome)   . Parkinson's disease     ATYPICAL  . Dizziness   . Vertigo   . Fatigue   . Advanced age   . HLD (hyperlipidemia)     intolerant to lipid lowering drugs  . Normal nuclear stress test 2003  . CKD (chronic kidney disease)     Past Surgical History  Procedure Date  . Tonsillectomy   . Cholecystectomy   . Breast cyst removed   . Abdominal hysterectomy   . Small intestine surgery     History  Substance Use Topics  . Smoking status: Former Smoker    Quit date: 02/14/1971  . Smokeless tobacco: Not on file  . Alcohol Use: No    Family History  Problem Relation  Age of Onset  . Stroke Father     Allergies  Allergen Reactions  . Amlodipine     Swelling --sick and ended up in hospital N&V  . Codeine Other (See Comments)    unknown  . Lipitor (Atorvastatin Calcium) Other (See Comments)    unknown  . Lisinopril Other (See Comments)    unknown  . Red Yeast Rice Other (See Comments)    unknown  . Zetia (Ezetimibe) Other (See Comments)    unknown    Medication list has been reviewed and updated.  Current Outpatient Prescriptions on File Prior to Visit  Medication Sig Dispense Refill  . aspirin 81 MG tablet Take 81 mg by mouth daily.      Marland Kitchen estrogens, conjugated, (PREMARIN) 0.3 MG tablet Take 1 tablet (0.3 mg total) by mouth daily.  90 tablet  3  . FLUoxetine (PROZAC) 40 MG capsule Take 1 capsule (40 mg total) by mouth daily.  90 capsule  3  . hydrALAZINE (APRESOLINE) 25 MG tablet Take 1 tablet (25 mg total) by mouth 3 (three) times daily.  270 tablet  3  . hydrochlorothiazide (HYDRODIURIL) 25 MG tablet TAKE ONE-HALF (1/2) TABLET DAILY  45 tablet  3  . metoprolol succinate (TOPROL XL) 25 MG 24 hr tablet Take 1 tablet (25 mg total) by mouth  2 (two) times daily.  60 tablet  5  . Multiple Vitamin (MULITIVITAMIN WITH MINERALS) TABS Take 1 tablet by mouth daily.      . Multiple Vitamins-Minerals (ICAPS PO) Take by mouth daily.          Review of Systems:  As per HPI, otherwise negative.    Physical Examination: Filed Vitals:   05/28/12 1414  BP: 131/76  Pulse: 96  Temp: 98.4 F (36.9 C)  Resp: 18   Filed Vitals:   05/28/12 1414  Height: 5\' 5"  (1.651 m)  Weight: 148 lb 12.8 oz (67.495 kg)   Body mass index is 24.76 kg/(m^2). Ideal Body Weight: Weight in (lb) to have BMI = 25: 149.9   GEN: WDWN, NAD, Non-toxic, A & O x 3  No shortness of breath HEENT: Atraumatic, Normocephalic. Neck supple. No masses, No LAD. Ears and Nose: No external deformity.  TM negative CV: RRR, No M/G/R. No JVD. No thrill. No extra heart sounds. PULM: CTA  B, no wheezes, crackles, rhonchi. No retractions. No resp. distress. No accessory muscle use. ABD: S, NT, ND, +BS. No rebound. No HSM. EXTR: No c/c/e  Tender left shoulder with limited but symmetric ROM.  Ecchymotic and tender left pelvis NEURO  Assisted gait.  PSYCH: Normally interactive. Conversant. Not depressed or anxious appearing.  Calm demeanor.    Assessment and Plan: Contusion hip Contusion shoulder Tylenol for pain  [facility] reading (PRIMARY) by  Dr. Dareen Piano.  Left hip no osseous injury.  [facility] reading (PRIMARY) by  Dr. Dareen Piano.  Left shoulder no osseous injury or dislocation.    Carmelina Dane, MD  I have reviewed and agree with documentation. Robert P. Merla Riches, M.D.

## 2012-05-31 ENCOUNTER — Encounter (HOSPITAL_COMMUNITY): Payer: Self-pay | Admitting: Emergency Medicine

## 2012-05-31 ENCOUNTER — Inpatient Hospital Stay (HOSPITAL_COMMUNITY)
Admission: EM | Admit: 2012-05-31 | Discharge: 2012-06-11 | DRG: 175 | Disposition: A | Discharge status: Skilled Nursing Facility | Payer: Medicare Other | Attending: Family Medicine | Admitting: Family Medicine

## 2012-05-31 ENCOUNTER — Ambulatory Visit: Payer: Medicare Other

## 2012-05-31 ENCOUNTER — Telehealth: Payer: Self-pay | Admitting: Cardiology

## 2012-05-31 ENCOUNTER — Ambulatory Visit (INDEPENDENT_AMBULATORY_CARE_PROVIDER_SITE_OTHER): Payer: Medicare Other | Admitting: Family Medicine

## 2012-05-31 ENCOUNTER — Emergency Department (HOSPITAL_COMMUNITY): Payer: Medicare Other

## 2012-05-31 ENCOUNTER — Other Ambulatory Visit: Payer: Self-pay

## 2012-05-31 VITALS — BP 178/77 | HR 72 | Temp 98.0°F | Resp 18 | Ht 65.0 in | Wt 148.0 lb

## 2012-05-31 DIAGNOSIS — I10 Essential (primary) hypertension: Secondary | ICD-10-CM

## 2012-05-31 DIAGNOSIS — M25512 Pain in left shoulder: Secondary | ICD-10-CM | POA: Diagnosis present

## 2012-05-31 DIAGNOSIS — M4712 Other spondylosis with myelopathy, cervical region: Secondary | ICD-10-CM | POA: Diagnosis present

## 2012-05-31 DIAGNOSIS — G20 Parkinson's disease: Secondary | ICD-10-CM | POA: Diagnosis present

## 2012-05-31 DIAGNOSIS — R0789 Other chest pain: Secondary | ICD-10-CM

## 2012-05-31 DIAGNOSIS — I129 Hypertensive chronic kidney disease with stage 1 through stage 4 chronic kidney disease, or unspecified chronic kidney disease: Secondary | ICD-10-CM | POA: Diagnosis present

## 2012-05-31 DIAGNOSIS — R079 Chest pain, unspecified: Secondary | ICD-10-CM

## 2012-05-31 DIAGNOSIS — R110 Nausea: Secondary | ICD-10-CM | POA: Diagnosis present

## 2012-05-31 DIAGNOSIS — M25519 Pain in unspecified shoulder: Secondary | ICD-10-CM

## 2012-05-31 DIAGNOSIS — N179 Acute kidney failure, unspecified: Secondary | ICD-10-CM | POA: Diagnosis present

## 2012-05-31 DIAGNOSIS — E7800 Pure hypercholesterolemia, unspecified: Secondary | ICD-10-CM

## 2012-05-31 DIAGNOSIS — Z23 Encounter for immunization: Secondary | ICD-10-CM

## 2012-05-31 DIAGNOSIS — E872 Acidosis, unspecified: Secondary | ICD-10-CM | POA: Diagnosis present

## 2012-05-31 DIAGNOSIS — G253 Myoclonus: Secondary | ICD-10-CM | POA: Diagnosis present

## 2012-05-31 DIAGNOSIS — G20A1 Parkinson's disease without dyskinesia, without mention of fluctuations: Secondary | ICD-10-CM | POA: Diagnosis present

## 2012-05-31 DIAGNOSIS — Z8679 Personal history of other diseases of the circulatory system: Secondary | ICD-10-CM

## 2012-05-31 DIAGNOSIS — G959 Disease of spinal cord, unspecified: Secondary | ICD-10-CM | POA: Diagnosis present

## 2012-05-31 DIAGNOSIS — N184 Chronic kidney disease, stage 4 (severe): Secondary | ICD-10-CM | POA: Diagnosis present

## 2012-05-31 DIAGNOSIS — R9431 Abnormal electrocardiogram [ECG] [EKG]: Secondary | ICD-10-CM

## 2012-05-31 DIAGNOSIS — R42 Dizziness and giddiness: Secondary | ICD-10-CM

## 2012-05-31 DIAGNOSIS — K589 Irritable bowel syndrome without diarrhea: Secondary | ICD-10-CM | POA: Diagnosis present

## 2012-05-31 DIAGNOSIS — R6889 Other general symptoms and signs: Secondary | ICD-10-CM

## 2012-05-31 DIAGNOSIS — E785 Hyperlipidemia, unspecified: Secondary | ICD-10-CM | POA: Diagnosis present

## 2012-05-31 DIAGNOSIS — I2699 Other pulmonary embolism without acute cor pulmonale: Principal | ICD-10-CM | POA: Diagnosis present

## 2012-05-31 DIAGNOSIS — W19XXXA Unspecified fall, initial encounter: Secondary | ICD-10-CM | POA: Diagnosis present

## 2012-05-31 DIAGNOSIS — R0602 Shortness of breath: Secondary | ICD-10-CM

## 2012-05-31 DIAGNOSIS — N390 Urinary tract infection, site not specified: Secondary | ICD-10-CM | POA: Diagnosis present

## 2012-05-31 DIAGNOSIS — Z780 Asymptomatic menopausal state: Secondary | ICD-10-CM

## 2012-05-31 DIAGNOSIS — A419 Sepsis, unspecified organism: Secondary | ICD-10-CM | POA: Diagnosis present

## 2012-05-31 DIAGNOSIS — E871 Hypo-osmolality and hyponatremia: Secondary | ICD-10-CM | POA: Diagnosis present

## 2012-05-31 DIAGNOSIS — W010XXA Fall on same level from slipping, tripping and stumbling without subsequent striking against object, initial encounter: Secondary | ICD-10-CM | POA: Diagnosis present

## 2012-05-31 DIAGNOSIS — Z87891 Personal history of nicotine dependence: Secondary | ICD-10-CM

## 2012-05-31 DIAGNOSIS — N189 Chronic kidney disease, unspecified: Secondary | ICD-10-CM

## 2012-05-31 DIAGNOSIS — I119 Hypertensive heart disease without heart failure: Secondary | ICD-10-CM

## 2012-05-31 DIAGNOSIS — K219 Gastro-esophageal reflux disease without esophagitis: Secondary | ICD-10-CM | POA: Diagnosis present

## 2012-05-31 DIAGNOSIS — F329 Major depressive disorder, single episode, unspecified: Secondary | ICD-10-CM | POA: Diagnosis present

## 2012-05-31 DIAGNOSIS — D72829 Elevated white blood cell count, unspecified: Secondary | ICD-10-CM | POA: Diagnosis present

## 2012-05-31 DIAGNOSIS — F3289 Other specified depressive episodes: Secondary | ICD-10-CM | POA: Diagnosis present

## 2012-05-31 DIAGNOSIS — D649 Anemia, unspecified: Secondary | ICD-10-CM | POA: Diagnosis present

## 2012-05-31 DIAGNOSIS — Z7982 Long term (current) use of aspirin: Secondary | ICD-10-CM

## 2012-05-31 HISTORY — DX: Unspecified fall, initial encounter: W19.XXXA

## 2012-05-31 HISTORY — DX: Other forms of dyspnea: R06.09

## 2012-05-31 HISTORY — DX: Unspecified osteoarthritis, unspecified site: M19.90

## 2012-05-31 HISTORY — DX: Dyspnea, unspecified: R06.00

## 2012-05-31 HISTORY — DX: Unspecified place in unspecified non-institutional (private) residence as the place of occurrence of the external cause: Y92.009

## 2012-05-31 HISTORY — DX: Other pulmonary embolism without acute cor pulmonale: I26.99

## 2012-05-31 HISTORY — DX: Chronic kidney disease, stage 4 (severe): N18.4

## 2012-05-31 LAB — CBC WITH DIFFERENTIAL/PLATELET
Basophils Absolute: 0 10*3/uL (ref 0.0–0.1)
Eosinophils Relative: 0 % (ref 0–5)
Lymphocytes Relative: 13 % (ref 12–46)
Lymphs Abs: 1.6 10*3/uL (ref 0.7–4.0)
MCV: 90 fL (ref 78.0–100.0)
Neutro Abs: 10 10*3/uL — ABNORMAL HIGH (ref 1.7–7.7)
Neutrophils Relative %: 81 % — ABNORMAL HIGH (ref 43–77)
Platelets: 300 10*3/uL (ref 150–400)
RBC: 4.68 MIL/uL (ref 3.87–5.11)
RDW: 13.3 % (ref 11.5–15.5)
WBC: 12.4 10*3/uL — ABNORMAL HIGH (ref 4.0–10.5)

## 2012-05-31 LAB — URINALYSIS, ROUTINE W REFLEX MICROSCOPIC
Bilirubin Urine: NEGATIVE
Nitrite: NEGATIVE
Specific Gravity, Urine: 1.016 (ref 1.005–1.030)
Urobilinogen, UA: 0.2 mg/dL (ref 0.0–1.0)

## 2012-05-31 LAB — BASIC METABOLIC PANEL
Calcium: 9 mg/dL (ref 8.4–10.5)
Creatinine, Ser: 1.7 mg/dL — ABNORMAL HIGH (ref 0.50–1.10)
GFR calc Af Amer: 30 mL/min — ABNORMAL LOW (ref >90)
GFR calc non Af Amer: 26 mL/min — ABNORMAL LOW (ref >90)
Sodium: 133 mEq/L — ABNORMAL LOW (ref 135–145)

## 2012-05-31 LAB — PROTIME-INR: INR: 1.04 (ref 0.00–1.49)

## 2012-05-31 LAB — URINE MICROSCOPIC-ADD ON

## 2012-05-31 LAB — TROPONIN I: Troponin I: <0.3 ng/mL (ref <0.30)

## 2012-05-31 MED ORDER — LABETALOL HCL 5 MG/ML IV SOLN
20.0000 mg | Freq: Once | INTRAVENOUS | Status: AC
  Administered 2012-05-31: 20 mg via INTRAVENOUS
  Filled 2012-05-31: qty 4

## 2012-05-31 MED ORDER — ASPIRIN 81 MG PO CHEW
162.0000 mg | CHEWABLE_TABLET | Freq: Once | ORAL | Status: AC
  Administered 2012-05-31: 162 mg via ORAL
  Filled 2012-05-31: qty 2

## 2012-05-31 MED ORDER — HYDRALAZINE HCL 25 MG PO TABS
25.0000 mg | ORAL_TABLET | Freq: Once | ORAL | Status: AC
  Administered 2012-05-31: 25 mg via ORAL
  Filled 2012-05-31: qty 1

## 2012-05-31 NOTE — ED Notes (Signed)
Patient transported to X-ray 

## 2012-05-31 NOTE — Telephone Encounter (Signed)
Patient stated hurt whenever she took a deep breath.  Advised  Dr. Patty Sermons out of the office this afternoon and tomorrow, she should go to urgent care. Patient verbalized understanding and stated she would.

## 2012-05-31 NOTE — Progress Notes (Signed)
  Subjective:    Patient ID: Nicole Harding, female    DOB: April 16, 1924, 78 y.o.   MRN: [id number]  HPI Nicole Harding is a 78 y.o. female Seen yesterday after a fall from 1 week ago. but here with new symptoms today.  Seen yesterday for hip pain and bruise.   Now with pain on right chest wall in rib area since last night - started without known injury. Hurts to take deep breath. Feels some shortness of breath.  Vomited once this morning, but has had this in past.   No hx of lung disease or PE. no hx cad or CHF known. Has been out of breath with walking for past few months. Normal nuclear stress test in 2003.  Postmenopausal - on low dose HRT.  Cardiologiist: [facility]. Last vist 04/17/12. Per patient - had xray that was apparently ok. No recent prolonged car travel.  Did fly to [place] and back in middle of September for 65th wedding anniversary.  No recent calf pain or swelling.  Review of Systems  Constitutional: Negative for fever and chills.  Respiratory: Positive for shortness of breath. Negative for cough and chest tightness.   Cardiovascular: Positive for chest pain (R chest  wall. ).       Objective:   Physical Exam  Vitals reviewed. Constitutional: She is oriented to person, place, and time. She appears well-developed and well-nourished.  HENT:  Head: Normocephalic.  Cardiovascular: Normal rate and regular rhythm.   No extrasystoles are present. Exam reveals distant heart sounds (distant, but normal rate, no ectopy. ).   Pulmonary/Chest: Effort normal and breath sounds normal. No respiratory distress. She has no wheezes. She has no rales. She exhibits tenderness (r sided lower chest wall, slightly reprodicible. ).       Breath sounds in all lung fields with deep breath.   Abdominal: Soft. She exhibits no distension. There is no tenderness.  Neurological: She is alert and oriented to person, place, and time.  Skin: Skin is warm and dry. No rash noted.  Psychiatric:  She has a normal mood and affect. Her behavior is normal.    [facility] reading (PRIMARY) by  Dr. Neva Seat: CXR and R rib series: degenerativ e changes without apparent fx. ? Scarring without apparent infiltrate.   EKG: first degree block, rate 54.  ST depression in II, III, AVF, V4-V5 not seen on May or September 2013 EKG.   Repeat 02sat RA- 89-92% on RA - 2 liters o2 [place] placed. - increased to 96-99%     Assessment & Plan:  Nicole Harding is a 78 y.o. female 1. Shortness of breath  DG Chest 2 View, DG Ribs Unilateral Right  2. Chest pain  DG Chest 2 View, DG Ribs Unilateral Right   R sided pleuritic chest pain since yesterday, with progressive dyspnea and few EKG changes form prior. Risk factors of PE - recent plane travel, hrt.   Reviewing cardiology notes, no known ischemic heart disease with normal nuclear stress test in 2003.   Placed on monitor, 02nc as above,EMS called for transport -  IV attempted, unsccessful, but EMS on scene - transfer of care at 1922.  Advised charge nurse at [facility].

## 2012-05-31 NOTE — ED Provider Notes (Signed)
History     CSN: [id number]  Arrival date & time 05/31/12  2002   First MD Initiated Contact with Patient 05/31/12 2030      Chief Complaint  Patient presents with  . Shortness of Breath    (Consider location/radiation/quality/duration/timing/severity/associated sxs/prior treatment) HPI Comments: Pt comes in with cc of chest pain and sob. Pt has hx of CKD, HTN, HL. She reports that starting last night, she has been having pain in her left lower posterior chest that is sharp, and is precipitated and aggravated with deep inspirations. She has no anterior chest pain. No new cough, no new n/v/f/c/diarrhea (has recurrent nausea, emesis at baseline). Pt has no hx of PE, she reports that over the past several weeks she has been having worsening sob, and now she gets sob just walking in her house. Pt has no orthopnea, PND, and she has no leg swelling, pain. She has no hx of CAD, and her last stress was several years ago. She has mentioned her sob to pcp and Cardiology doctors in the past, but no one has been able to give her a concrete reason. She denies any blood loss.    Patient is a 78 y.o. female presenting with shortness of breath. The history is provided by the patient and medical records.  Shortness of Breath  Associated symptoms include shortness of breath. Pertinent negatives include no chest pain.    Past Medical History  Diagnosis Date  . Hypertension   . History of orthostatic hypotension   . IBS (irritable bowel syndrome)   . Parkinson's disease     ATYPICAL  . Dizziness   . Vertigo   . Fatigue   . Advanced age   . HLD (hyperlipidemia)     intolerant to lipid lowering drugs  . Normal nuclear stress test 2003  . CKD (chronic kidney disease)     Past Surgical History  Procedure Date  . Tonsillectomy   . Cholecystectomy   . Breast cyst removed   . Abdominal hysterectomy   . Small intestine surgery     Family History  Problem Relation Age of Onset  . Stroke  Father     History  Substance Use Topics  . Smoking status: Former Smoker    Quit date: 02/14/1971  . Smokeless tobacco: Not on file  . Alcohol Use: No    OB History    Grav Para Term Preterm Abortions TAB SAB Ect Mult Living                  Review of Systems  Constitutional: Positive for activity change.  HENT: Negative for neck pain.   Respiratory: Positive for shortness of breath. Negative for chest tightness.   Cardiovascular: Negative for chest pain.  Gastrointestinal: Negative for nausea, vomiting and abdominal pain.  Genitourinary: Negative for dysuria.  Neurological: Negative for headaches.    Allergies  Amlodipine; Codeine; Lipitor; Lisinopril; Red yeast rice; and Zetia  Home Medications   Current Outpatient Rx  Name Route Sig Dispense Refill  . ASPIRIN 81 MG PO TABS Oral Take 81 mg by mouth daily.    Marland Kitchen ESTROGENS CONJUGATED 0.3 MG PO TABS Oral Take 1 tablet (0.3 mg total) by mouth daily. 90 tablet 3    NEW DOSE  . FLUOXETINE HCL 40 MG PO CAPS Oral Take 1 capsule (40 mg total) by mouth daily. 90 capsule 3  . HYDRALAZINE HCL 25 MG PO TABS Oral Take 1 tablet (25 mg total) by mouth 3 (  three) times daily. 270 tablet 3    NEW DOSE  . HYDROCHLOROTHIAZIDE 25 MG PO TABS  TAKE ONE-HALF (1/2) TABLET DAILY 45 tablet 3  . METOPROLOL SUCCINATE ER 25 MG PO TB24 Oral Take 1 tablet (25 mg total) by mouth 2 (two) times daily. 60 tablet 5  . ADULT MULTIVITAMIN W/MINERALS CH Oral Take 1 tablet by mouth daily.    . ICAPS PO Oral Take by mouth daily.      Frazier Butt OP Ophthalmic Apply 1 drop to eye daily.      BP 211/75  Pulse 57  Temp 98.4 F (36.9 C) (Oral)  Resp 17  SpO2 94%  Physical Exam  Nursing note and vitals reviewed. Constitutional: She is oriented to person, place, and time. She appears well-developed.  HENT:  Head: Normocephalic and atraumatic.  Eyes: Conjunctivae normal and EOM are normal. Pupils are equal, round, and reactive to light.  Neck: Normal range  of motion. Neck supple. No JVD present.  Cardiovascular: Normal rate and regular rhythm.   Murmur heard. Pulmonary/Chest: Effort normal and breath sounds normal. No respiratory distress.       No reproducible flank tenderness  Abdominal: Soft. Bowel sounds are normal. She exhibits no distension. There is no tenderness. There is no rebound and no guarding.  Neurological: She is alert and oriented to person, place, and time.  Skin: Skin is warm and dry.    ED Course  Procedures (including critical care time)  Labs Reviewed  CBC WITH DIFFERENTIAL - Abnormal; Notable for the following:    WBC 12.4 (*)     Neutrophils Relative 81 (*)     Neutro Abs 10.0 (*)     All other components within normal limits  URINALYSIS, ROUTINE W REFLEX MICROSCOPIC - Abnormal; Notable for the following:    APPearance CLOUDY (*)     Protein, ur 30 (*)     Leukocytes, UA MODERATE (*)     All other components within normal limits  URINE MICROSCOPIC-ADD ON - Abnormal; Notable for the following:    Squamous Epithelial / LPF MANY (*)     Bacteria, UA FEW (*)     All other components within normal limits  PROTIME-INR  APTT  BASIC METABOLIC PANEL  TROPONIN I  URINE CULTURE  D-DIMER, QUANTITATIVE   Dg Chest 2 View  05/31/2012  *RADIOLOGY REPORT*  Clinical Data: Chest pain.  CHEST - 2 VIEW  Comparison: 05/31/2012 and 08/15/2005  Findings: Two views of the chest were obtained.  There are scattered calcifications throughout both sides of the chest which are unchanged.  There is stable prominence of a right structure. The right hilar structure is likely vascular in etiology and similar to an exam in 2007.  Heart size is normal.  No evidence for a pneumothorax.  IMPRESSION: Stable chest radiograph findings.  No acute disease.  Evidence for old granulomatous disease.   Original Report Authenticated By: Richarda Overlie, M.D.    Dg Chest 2 View  05/31/2012  *RADIOLOGY REPORT*  Clinical Data: Shortness of breath, sharp pain  under the right ribs, no known injury  CHEST - 2 VIEW  Comparison: 12/24/2007  Findings:  Unchanged cardiac silhouette and mediastinal contours with mild tortuosity of the thoracic aorta.  Calcified granulomas are again seen overlying the bilateral upper lungs.  The lungs appear hyperexpanded with flattening of bilateral hemidiaphragms.  No focal parenchymal opacities.  No definite pleural effusion or pneumothorax.  No definite acute osseous abnormalities.  IMPRESSION: 1.  Hyperexpanded lungs without definite acute cardiopulmonary disease. 2.  Stable sequela of prior granulomatous infection.   Original Report Authenticated By: Waynard Reeds, M.D.    Dg Ribs Unilateral Right  05/31/2012  *RADIOLOGY REPORT*  Clinical Data: Sharp pain under right ribs, no known injury  RIGHT RIBS - 2 VIEW  Comparison: Chest radiograph - earlier same day; 12/24/2007  Findings:  Unchanged cardiac silhouette and mediastinal contours with atherosclerotic calcifications within a mildly tortuous thoracic aorta.  Calcified granuloma overlies the right upper lung.  No definite displaced right-sided rib fractures with special attention paid to the area demarcated by the radiopaque arrow.  IMPRESSION: No displaced right-sided rib fractures with special attention paid to the area demarcated by the radiopaque arrow.   Original Report Authenticated By: Waynard Reeds, M.D.      No diagnosis found.    MDM   Date: 06/01/2012  Rate: 65  Rhythm: normal sinus rhythm  QRS Axis: normal  Intervals: PR prolonged  ST/T Wave abnormalities: nonspecific ST/T changes  Conduction Disutrbances:first-degree A-V block   Narrative Interpretation:   Old EKG Reviewed: unchanged   Date: 06/01/2012  Rate: 79  Rhythm: normal sinus rhythm  QRS Axis: normal  Intervals: PR prolonged  ST/T Wave abnormalities: nonspecific ST/T changes  Conduction Disutrbances:first-degree A-V block   Narrative Interpretation:   Old EKG Reviewed:  unchanged  Differential diagnosis includes: ACS syndrome CHF exacerbation Valvular disorder Myocarditis Pericarditis Pericardial effusion Pneumonia Pleural effusion Pulmonary edema PE Anemia  Pt comes in with cc of pleuritic right sided flank pain and worsening sob. Pt has no known CAD, last cardiac workup was few years back and was normal. No anterior chest pain, but sob in it self can be angina equivalent for this patient - so I called [facility], spoke with the fellow on call, and if all the workup in the ED is normal, they will be happy to see her soon.  The SOB is exertional. No hx of CHF, no clinical findings suggesting CHF. Cardiac and Lung Exam is normal. Pt was noted to have O2 sats in the low 90s - and after observing her in the ED for a little while, i decided to get a dimer - which is POSITIVE. With her CKD, Radiology feels VQ scan will be more appropriate  - and it has been ordered.  Plan is to get VQ scan - if the results are normal - she will see her Cardiology. If the VQ is strongly suggestive of PE - she will be admitted and if the VQ is indeterminate- patient will see her PCP and Cardiology for further query on PE. Pt and family informed of the plan.       Differential diagnosis includes: ACS syndrome CHF exacerbation Valvular disorder Myocarditis Pericarditis Pericardial effusion Pneumonia Pleural effusion Pulmonary edema PE Anemia Musculoskeletal pain       Derwood Kaplan, MD 06/01/12 0009

## 2012-05-31 NOTE — ED Notes (Addendum)
Arrived via [facility]. At urgent care for SOB last night. Increasing pain on inspiration. reffered over for possible PE. Patient states that when she takes a deep breath she feesl a sharp pain.. Patient states increasing SOB since last night

## 2012-05-31 NOTE — Telephone Encounter (Signed)
New problem:  C/o sharp pain under right side breast. Hard to breath .

## 2012-06-01 ENCOUNTER — Emergency Department (HOSPITAL_COMMUNITY): Payer: Medicare Other

## 2012-06-01 ENCOUNTER — Encounter (HOSPITAL_COMMUNITY): Payer: Self-pay | Admitting: General Practice

## 2012-06-01 DIAGNOSIS — N184 Chronic kidney disease, stage 4 (severe): Secondary | ICD-10-CM

## 2012-06-01 DIAGNOSIS — W19XXXA Unspecified fall, initial encounter: Secondary | ICD-10-CM | POA: Diagnosis present

## 2012-06-01 DIAGNOSIS — M25512 Pain in left shoulder: Secondary | ICD-10-CM | POA: Diagnosis present

## 2012-06-01 DIAGNOSIS — R110 Nausea: Secondary | ICD-10-CM

## 2012-06-01 DIAGNOSIS — N390 Urinary tract infection, site not specified: Secondary | ICD-10-CM | POA: Diagnosis present

## 2012-06-01 DIAGNOSIS — R0609 Other forms of dyspnea: Secondary | ICD-10-CM

## 2012-06-01 DIAGNOSIS — R0789 Other chest pain: Secondary | ICD-10-CM

## 2012-06-01 DIAGNOSIS — E7800 Pure hypercholesterolemia, unspecified: Secondary | ICD-10-CM

## 2012-06-01 DIAGNOSIS — I10 Essential (primary) hypertension: Secondary | ICD-10-CM

## 2012-06-01 DIAGNOSIS — R0602 Shortness of breath: Secondary | ICD-10-CM

## 2012-06-01 DIAGNOSIS — I2699 Other pulmonary embolism without acute cor pulmonale: Secondary | ICD-10-CM | POA: Diagnosis present

## 2012-06-01 DIAGNOSIS — R0989 Other specified symptoms and signs involving the circulatory and respiratory systems: Secondary | ICD-10-CM

## 2012-06-01 DIAGNOSIS — K589 Irritable bowel syndrome without diarrhea: Secondary | ICD-10-CM

## 2012-06-01 LAB — URINE CULTURE: Colony Count: 40000

## 2012-06-01 LAB — HEPARIN LEVEL (UNFRACTIONATED): Heparin Unfractionated: 0.68 IU/mL (ref 0.30–0.70)

## 2012-06-01 MED ORDER — WARFARIN VIDEO
Freq: Once | Status: AC
  Administered 2012-06-01: 19:00:00

## 2012-06-01 MED ORDER — HYDROCODONE-ACETAMINOPHEN 5-325 MG PO TABS
1.0000 | ORAL_TABLET | ORAL | Status: DC | PRN
  Administered 2012-06-06: 2 via ORAL
  Administered 2012-06-06: 1 via ORAL
  Filled 2012-06-01: qty 1
  Filled 2012-06-01: qty 2

## 2012-06-01 MED ORDER — ACETAMINOPHEN 325 MG PO TABS
650.0000 mg | ORAL_TABLET | Freq: Four times a day (QID) | ORAL | Status: DC | PRN
  Filled 2012-06-01: qty 2

## 2012-06-01 MED ORDER — ONDANSETRON HCL 4 MG PO TABS: 4.0000 mg | ORAL_TABLET | Freq: Four times a day (QID) | ORAL | Status: DC | PRN

## 2012-06-01 MED ORDER — ASPIRIN EC 81 MG PO TBEC
81.0000 mg | DELAYED_RELEASE_TABLET | Freq: Every day | ORAL | Status: DC
  Administered 2012-06-01 – 2012-06-11 (×11): 81 mg via ORAL
  Filled 2012-06-01 (×11): qty 1

## 2012-06-01 MED ORDER — HYDRALAZINE HCL 25 MG PO TABS
25.0000 mg | ORAL_TABLET | Freq: Three times a day (TID) | ORAL | Status: DC
  Administered 2012-06-01 – 2012-06-02 (×4): 25 mg via ORAL
  Filled 2012-06-01 (×6): qty 1

## 2012-06-01 MED ORDER — WARFARIN - PHARMACIST DOSING INPATIENT: Freq: Every day | Status: DC

## 2012-06-01 MED ORDER — ASPIRIN 81 MG PO TABS: 81.0000 mg | ORAL_TABLET | Freq: Every day | ORAL | Status: DC

## 2012-06-01 MED ORDER — FLUOXETINE HCL 20 MG PO CAPS
40.0000 mg | ORAL_CAPSULE | Freq: Every day | ORAL | Status: DC
  Administered 2012-06-01 – 2012-06-02 (×2): 40 mg via ORAL
  Filled 2012-06-01 (×2): qty 2

## 2012-06-01 MED ORDER — SODIUM CHLORIDE 0.9 % IV SOLN
INTRAVENOUS | Status: DC
  Administered 2012-06-01 – 2012-06-02 (×2): via INTRAVENOUS

## 2012-06-01 MED ORDER — TECHNETIUM TO 99M ALBUMIN AGGREGATED
2.0000 | Freq: Once | INTRAVENOUS | Status: AC | PRN
  Administered 2012-06-01: 2 via INTRAVENOUS

## 2012-06-01 MED ORDER — TECHNETIUM TC 99M DIETHYLENETRIAME-PENTAACETIC ACID: 38.0000 | Freq: Once | INTRAVENOUS | Status: DC | PRN

## 2012-06-01 MED ORDER — ADULT MULTIVITAMIN W/MINERALS CH
1.0000 | ORAL_TABLET | Freq: Every day | ORAL | Status: DC
  Administered 2012-06-01 – 2012-06-11 (×10): 1 via ORAL
  Filled 2012-06-01 (×11): qty 1

## 2012-06-01 MED ORDER — HEPARIN (PORCINE) IN NACL 100-0.45 UNIT/ML-% IJ SOLN
1000.0000 [IU]/h | INTRAMUSCULAR | Status: DC
  Administered 2012-06-01 – 2012-06-02 (×3): 1100 [IU]/h via INTRAVENOUS
  Administered 2012-06-03 – 2012-06-05 (×3): 1000 [IU]/h via INTRAVENOUS
  Filled 2012-06-01 (×8): qty 250

## 2012-06-01 MED ORDER — ONDANSETRON HCL 4 MG/2ML IJ SOLN
4.0000 mg | Freq: Four times a day (QID) | INTRAMUSCULAR | Status: DC | PRN
  Administered 2012-06-04: 4 mg via INTRAVENOUS
  Filled 2012-06-01: qty 2

## 2012-06-01 MED ORDER — INFLUENZA VIRUS VACC SPLIT PF IM SUSP
0.5000 mL | INTRAMUSCULAR | Status: AC
  Filled 2012-06-01: qty 0.5

## 2012-06-01 MED ORDER — ACETAMINOPHEN 650 MG RE SUPP: 650.0000 mg | Freq: Four times a day (QID) | RECTAL | Status: DC | PRN

## 2012-06-01 MED ORDER — SODIUM CHLORIDE 0.9 % IJ SOLN
3.0000 mL | Freq: Two times a day (BID) | INTRAMUSCULAR | Status: DC
  Administered 2012-06-01 – 2012-06-11 (×13): 3 mL via INTRAVENOUS

## 2012-06-01 MED ORDER — METOPROLOL SUCCINATE ER 25 MG PO TB24
25.0000 mg | ORAL_TABLET | Freq: Two times a day (BID) | ORAL | Status: DC
  Administered 2012-06-01 – 2012-06-02 (×3): 25 mg via ORAL
  Filled 2012-06-01 (×4): qty 1

## 2012-06-01 MED ORDER — WARFARIN SODIUM 5 MG PO TABS
5.0000 mg | ORAL_TABLET | Freq: Once | ORAL | Status: AC
  Administered 2012-06-01: 5 mg via ORAL
  Filled 2012-06-01: qty 1

## 2012-06-01 MED ORDER — COUMADIN BOOK
Freq: Once | Status: AC
  Administered 2012-06-01: 19:00:00
  Filled 2012-06-01: qty 1

## 2012-06-01 MED ORDER — ALUM & MAG HYDROXIDE-SIMETH 200-200-20 MG/5ML PO SUSP: 30.0000 mL | Freq: Four times a day (QID) | ORAL | Status: DC | PRN

## 2012-06-01 MED ORDER — HEPARIN BOLUS VIA INFUSION
3500.0000 [IU] | Freq: Once | INTRAVENOUS | Status: AC
  Administered 2012-06-01: 3500 [IU] via INTRAVENOUS
  Filled 2012-06-01: qty 3500

## 2012-06-01 MED ORDER — HYDROMORPHONE HCL PF 1 MG/ML IJ SOLN: 1.0000 mg | INTRAMUSCULAR | Status: DC | PRN

## 2012-06-01 NOTE — ED Notes (Signed)
Hospitalist at the bedside at this time ?

## 2012-06-01 NOTE — Progress Notes (Signed)
Pharmacy Consult-Anticoagulation  Pharmacy Consult:    78 y/o female is currently on heparin protocol and Coumadin for Suspected PE.    Lower extremitiy venous duplex negative for DVT.   Current Labs: Hematology  Basename 06/01/12 2048 05/31/12 2210 05/31/12 2209  HGB -- 14.3 --  HCT -- 42.1 --  PLT -- 300 --  APTT -- 30 --  LABPROT -- 13.5 --  INR -- 1.04 --  HEPARINUNFRC 0.68 -- --  CREATININE -- 1.70* --  CKTOTAL -- -- --  CKMB -- -- --  TROPONINI -- -- <0.30   Lab Results  Component Value Date   INR 1.04 05/31/2012   INR 1.0 12/24/2007    Estimated Creatinine Clearance: 21.4 ml/min (by C-G formula based on Cr of 1.7).  Current Meds:    aspirin 162 mg Oral Once  aspirin EC 81 mg Oral Daily  coumadin book  Does not apply Once  FLUoxetine 40 mg Oral Daily  heparin 3,500 Units Intravenous Once  hydrALAZINE 25 mg Oral Once  hydrALAZINE 25 mg Oral TID  influenza  inactive virus vaccine 0.5 mL Intramuscular Tomorrow-1000  labetalol 20 mg Intravenous Once  metoprolol succinate 25 mg Oral BID  multivitamin with minerals 1 tablet Oral Daily  sodium chloride 3 mL Intravenous Q12H  warfarin 5 mg Oral ONCE-1800  warfarin  Does not apply Once  Warfarin - Pharmacist Dosing Inpatient  Does not apply q1800  DISCONTD: aspirin 81 mg Oral Daily    Assessment:  Heparin level is therapeutic = 0.68 units/ml.  No bleeding complications observed.  Goal/Plan:  Heparin goal is 0.3 - 0.7.  Heparin will be continued at 1100 units/hr.  Next Heparin level due with AM labs.  Daily Heparin level, INR, CBC.  Moksh Loomer, Elisha Headland, Pharm.D. 06/01/2012  9:44 PM

## 2012-06-01 NOTE — ED Notes (Signed)
Husband at bedside, very supportive. Given coffee. Pt and spouse awaiting results from test at this time.

## 2012-06-01 NOTE — ED Provider Notes (Signed)
0630: Patient re-evaluated by me.  Unfortunately, there was inadequate communication within the Radiology Dept which caused significant delay in the patient's V/Q study.  The patient has been asymptomatic overnight. She says that right sided pleuritic chest pain has spontaneously resolved. She was on 02 at 2L via [place] when I evaluated her. She does not use 02 at home and has no history of chronic lung or cardiac disease. I removed the patient's 02 and she is maintaining sats in the 95-96% range on RA despite a very thick coat of dark gel nail polish.   Patient notes that she has had DOE for the past year or so and has talked to Dr. Patty Sermons about this. But, she says it has never been worked up.   Patient was hypertensive initially last night but, this has improved after tx with the patient's home meds by Dr. Demetrio Lapping.  The interpretation of the V/Q scan is pending. If negative, the patient is safe for discharge with plan for close outpatient f/u with both PCP and Dr. Patty Sermons for further evaluation of chronic DOE.   Brandt Loosen, MD 06/01/12 [phone]

## 2012-06-01 NOTE — ED Notes (Signed)
Pt taken to nuclear medicine for her V/Q scan

## 2012-06-01 NOTE — H&P (Signed)
History and Physical       Hospital Admission Note Date: 06/01/2012  Patient name: Nicole Harding Medical record number: [id number] Date of birth: [birth year] Age: 78 y.o. Gender: female PCP: [name], MD    Chief Complaint:  Right-sided chest pain with left shoulder pain  HPI: Patient is 78 year old female with medical history of hypertension, irritable bowel syndrome, hyperlipidemia, hypertension, CKD, stage III-IV presented to ED with complaints of left shoulder pain, left hip pain, right sided pleuritic chest pain. History was obtained from the patient who stated that 2 days ago she was walking out of the closet in her tennis shoes when her foot Court on the irregular carpet at and she tripped. She fell and hit the rocking chair and landed on her left side and since then she had pain in the left shoulder and left hip. In the ED, left shoulder x-ray and left hip x-ray did not show any fracture or dislocations. Due to her complaint of right-sided pleuritic chest pain, VQ scan was done which showed intermediate probability for pulmonary embolism. Patient also reports that she had a long distance flight in July when she went to [place] for 2 weeks for her 67th wedding anniversary. She also reports shortness of breath which has been somewhat chronic in the last 1 year however she is now feeling short of breath even on 20 feet walking from her bedroom to the kitchen. She had nausea and one episode of vomiting yesterday, off and on diarrhea spells which she attributes to her history of irritable bowel syndrome. Patient saw Dr. Chilton Si in the urgent care yesterday and was also concerned for possible "lung clot" due to her complaint of pleuritic chest pain.   Review of Systems:  Constitutional: Denies fever, chills, diaphoresis, appetite change. + fatigue.  HEENT: Denies photophobia, eye pain, redness, hearing loss, ear pain, congestion,  sore throat, rhinorrhea, sneezing, mouth sores, trouble swallowing, neck pain, neck stiffness and tinnitus.   Respiratory: Please see history of present illness  Cardiovascular: Please see history of present illness, no peripheral edema  Gastrointestinal: Please see history of present illness, she denies any hematochezia or melena Genitourinary: Denies dysuria, urgency, frequency, hematuria, flank pain and difficulty urinating.  UA in ED however shows UTI Musculoskeletal: Please see HPI  Skin: Denies pallor, rash and wound.  Neurological: Denies dizziness, seizures, syncope, weakness, light-headedness, numbness and headaches.  Hematological: Denies adenopathy. Easy bruising, personal or family bleeding history  Psychiatric/Behavioral: Denies suicidal ideation, mood changes, confusion, nervousness, sleep disturbance and agitation  Past Medical History: Past Medical History  Diagnosis Date  . Hypertension   . History of orthostatic hypotension   . IBS (irritable bowel syndrome)   . Parkinson's disease     ATYPICAL  . Dizziness   . Vertigo   . Fatigue   . Advanced age   . HLD (hyperlipidemia)     intolerant to lipid lowering drugs  . Normal nuclear stress test 2003  . CKD (chronic kidney disease)    Past Surgical History  Procedure Date  . Tonsillectomy   . Cholecystectomy   . Breast cyst removed   . Abdominal hysterectomy   . Small intestine surgery     Medications: Prior to Admission medications   Medication Sig Start Date End Date Taking? Authorizing Provider  aspirin 81 MG tablet Take 81 mg by mouth daily.   Yes Historical Provider, MD  estrogens, conjugated, (PREMARIN) 0.3 MG tablet Take 1 tablet (0.3 mg total) by mouth daily. 01/16/12  Yes [name], MD  FLUoxetine (PROZAC) 40 MG capsule Take 1 capsule (40 mg total) by mouth daily. 04/17/12 04/17/13 Yes Rosalio Macadamia, NP  hydrALAZINE (APRESOLINE) 25 MG tablet Take 1 tablet (25 mg total) by mouth 3 (three) times  daily. 01/16/12 01/15/13 Yes [name], MD  hydrochlorothiazide (HYDRODIURIL) 25 MG tablet TAKE ONE-HALF (1/2) TABLET DAILY 09/28/11  Yes [name], MD  metoprolol succinate (TOPROL XL) 25 MG 24 hr tablet Take 1 tablet (25 mg total) by mouth 2 (two) times daily. 12/20/11  Yes [name], MD  Multiple Vitamin (MULITIVITAMIN WITH MINERALS) TABS Take 1 tablet by mouth daily.   Yes Historical Provider, MD  Multiple Vitamins-Minerals (ICAPS PO) Take by mouth daily.     Yes Historical Provider, MD  Polyethyl Glycol-Propyl Glycol (SYSTANE OP) Apply 1 drop to eye daily.   Yes Historical Provider, MD    Allergies:   Allergies  Allergen Reactions  . Amlodipine     Swelling --sick and ended up in hospital N&V  . Codeine Other (See Comments)    unknown  . Lipitor (Atorvastatin Calcium) Other (See Comments)    unknown  . Lisinopril Other (See Comments)    unknown  . Red Yeast Rice Other (See Comments)    unknown  . Zetia (Ezetimibe) Other (See Comments)    unknown    Social History:  reports that she quit smoking about 41 years ago. She does not have any smokeless tobacco history on file. She reports that she does not drink alcohol or use illicit drugs.she lives at home with her husband and ambulates with a walker and cane.  Family History: Family History  Problem Relation Age of Onset  . Stroke Father     Physical Exam: Blood pressure 176/79, pulse 59, temperature 98.4 F (36.9 C), temperature source Oral, resp. rate 26, SpO2 98.00%. General: Alert, awake, oriented x3, in no acute distress. HEENT: normocephalic, atraumatic, anicteric sclera, pink conjunctiva, pupils equal and reactive to light and accomodation, oropharynx clear Neck: supple, no masses or lymphadenopathy, no goiter, no bruits  Heart: Regular rate and rhythm, without murmurs, rubs or gallops. Lungs: Clear to auscultation bilaterally, no wheezing, rales or rhonchi. Abdomen: Soft, nontender, nondistended,  positive bowel sounds, no masses. Extremities: No clubbing, cyanosis or edema with positive pedal pulses. Range of motion in left shoulder and hip is normal.  Neuro: Grossly intact, no focal neurological deficits, strength 5/5 upper and lower extremities bilaterally Psych: alert and oriented x 3, normal mood and affect Skin:  warm and dry   LABS on Admission:  Basic Metabolic Panel:  Lab 05/31/12 1610  NA 133*  K 3.4*  CL 98  CO2 20  GLUCOSE 176*  BUN 34*  CREATININE 1.70*  CALCIUM 9.0  MG --  PHOS --   CBC:  Lab 05/31/12 2210  WBC 12.4*  NEUTROABS 10.0*  HGB 14.3  HCT 42.1  MCV 90.0  PLT 300   Cardiac Enzymes:  Lab 05/31/12 2209  CKTOTAL --  CKMB --  CKMBINDEX --  TROPONINI <0.30   BNP: No components found with this basename: POCBNP:2 CBG: No results found for this basename: GLUCAP:2 in the last 168 hours   Radiological Exams on Admission: Dg Chest 2 View  05/31/2012  *RADIOLOGY REPORT*  Clinical Data: Chest pain.  CHEST - 2 VIEW  Comparison: 05/31/2012 and 08/15/2005  Findings: Two views of the chest were obtained.  There are scattered calcifications throughout both sides of the chest which are unchanged.  There is stable prominence of a right structure. The right hilar structure is likely vascular in etiology and similar to an exam in 2007.  Heart size is normal.  No evidence for a pneumothorax.  IMPRESSION: Stable chest radiograph findings.  No acute disease.  Evidence for old granulomatous disease.   Original Report Authenticated By: Richarda Overlie, M.D.    Dg Chest 2 View  05/31/2012  *RADIOLOGY REPORT*  Clinical Data: Shortness of breath, sharp pain under the right ribs, no known injury  CHEST - 2 VIEW  Comparison: 12/24/2007  Findings:  Unchanged cardiac silhouette and mediastinal contours with mild tortuosity of the thoracic aorta.  Calcified granulomas are again seen overlying the bilateral upper lungs.  The lungs appear hyperexpanded with flattening of  bilateral hemidiaphragms.  No focal parenchymal opacities.  No definite pleural effusion or pneumothorax.  No definite acute osseous abnormalities.  IMPRESSION: 1.  Hyperexpanded lungs without definite acute cardiopulmonary disease. 2.  Stable sequela of prior granulomatous infection.   Original Report Authenticated By: Waynard Reeds, M.D.    Dg Ribs Unilateral Right  05/31/2012  *RADIOLOGY REPORT*  Clinical Data: Sharp pain under right ribs, no known injury  RIGHT RIBS - 2 VIEW  Comparison: Chest radiograph - earlier same day; 12/24/2007  Findings:  Unchanged cardiac silhouette and mediastinal contours with atherosclerotic calcifications within a mildly tortuous thoracic aorta.  Calcified granuloma overlies the right upper lung.  No definite displaced right-sided rib fractures with special attention paid to the area demarcated by the radiopaque arrow.  IMPRESSION: No displaced right-sided rib fractures with special attention paid to the area demarcated by the radiopaque arrow.   Original Report Authenticated By: Waynard Reeds, M.D.    Dg Hip Complete Left  05/28/2012  *RADIOLOGY REPORT*  Clinical Data: Fall, hip injury  LEFT HIP - COMPLETE 2+ VIEW  Comparison: None.  Findings: Bones are osteopenic.  Degenerative changes of the lower lumbar spine, pelvis, and both hips.  Pelvis and hips appear intact.  No displaced left hip fracture.  Trabecular patterns appear symmetric and intact.  IMPRESSION: Degenerative changes and osteopenia.  No acute osseous finding by plain radiography   Original Report Authenticated By: Judie Petit. Ruel Favors, M.D.    Nm Pulmonary Perf And Vent  06/01/2012  *RADIOLOGY REPORT*  Clinical Data: Shortness of breath and chest pain on the right. The patient status post fall 1 week ago with right rib trauma.  NM PULMONARY VENTILATION AND PERFUSION SCAN  Radiopharmaceutical: CURIE MAA TECHNETIUM TO 19M ALBUMIN AGGREGATED 38 mCi technetium 20m DTPA for ventilation  Comparison: Plain  film chest 05/31/2012.  Findings: Multiple peripheral wedge shaped defects are seen on perfusion imaging.  Most of these are matched although matching appears incomplete in some locations such as the right middle lobe.  IMPRESSION: Intermediate probability for pulmonary embolus.   Original Report Authenticated By: Bernadene Bell. D'ALESSIO, M.D.    Dg Shoulder Left  05/28/2012  *RADIOLOGY REPORT*  Clinical Data: Contusion after fall.  LEFT SHOULDER - 2+ VIEW  Comparison: None.  Findings: No fractures or dislocations.  Mild degenerative changes in the acromioclavicular joint.  The glenohumeral joint is normally preserved.  Small coarse calcification in the rotator interval. Contiguous ribs are normal.  IMPRESSION: No acute skeletal trauma.   Original Report Authenticated By: Mervin Hack, M.D.     Assessment/Plan Principal Problem:  *Pulmonary embolism: Even though, VQ scan is intermediate acute pulmonary embolism she does have multiple peripheral wedge-shaped defects and  incomplete matching the in the right middle lobe which is consistent with her location of the pleuritic chest pain and worsening dyspnea, hypoxia in the ED. She did have a long distance flight 3 months ago and on estrogen therapy which places her at high risk for pulmonary embolism. - Admit to telemetry as patient is hemodynamically stable. She likely should have a CT angiogram of the chest however with CKD, stage IV, she is at high risk for acute renal failure. I will order Doppler ultrasound of the lower extremities and 2-D echo.  - Place her on heparin drip and Coumadin. I discussed in detail with her regarding feasibility of the Lovenox injections bridging with Coumadin however she feels somewhat overwhelmed with the idea of Lovenox injections and preferred heparin drip over Lovenox.   Active Problems:  Irritable bowel syndrome: place on anitemetics PRN.   UTI (urinary tract infection) - Obtain urine culture, place on Rocephin  IV daily    Pain in left shoulder And left hip likely secondary to mechanical fall - No fracture or dislocation on the x-rays, PT OT    Nausea: Zofran when necessary    CKD (chronic kidney disease) stage 4, GFR 15-29 ml/min - Place on gentle hydration, hold HCTZ    Accelerated hypertension - Restart hydralazine and metoprolol    Mechanical Fall: Patient had accidentally tripped over a carpet, she denies any falls. - Will do PT evaluation after the Doppler ultrasound results and 24 hours of anticoagulation  DVT prophylaxis: On therapeutic anticoagulation    CODE STATUS: I discussed in detail with the patient and she opted to be full CODE STATUS   Further plan will depend as patient's clinical course evolves and further radiologic and laboratory data become available.   Time Spent on Admission: 1 hour  Katheryn Culliton M.D. [facility] 06/01/2012, 9:19 AM Pager: [phone]  If 7PM-7AM, please contact night-coverage [email or web address] Password [email or web address]

## 2012-06-01 NOTE — Progress Notes (Signed)
  Echocardiogram 2D Echocardiogram has been performed.  Nicole Harding 06/01/2012, 2:35 PM

## 2012-06-01 NOTE — Progress Notes (Signed)
*  PRELIMINARY RESULTS* Vascular Ultrasound Lower extremity venous duplex has been completed.  Preliminary findings: no evidence of DVT or baker's cyst.  Farrel Demark, RDMS, RVT  06/01/2012, 2:40 PM

## 2012-06-01 NOTE — Progress Notes (Addendum)
ANTICOAGULATION CONSULT NOTE - Initial Consult  Pharmacy Consult for Heparin and Coumadin Indication: Suspected PE  Allergies  Allergen Reactions  . Amlodipine     Swelling --sick and ended up in hospital N&V  . Codeine Other (See Comments)    unknown  . Lipitor (Atorvastatin Calcium) Other (See Comments)    unknown  . Lisinopril Other (See Comments)    unknown  . Red Yeast Rice Other (See Comments)    unknown  . Zetia (Ezetimibe) Other (See Comments)    unknown    Patient Measurements:  Weight: 67.1kg Heparin Dosing Weight: 67.1kg  Vital Signs: BP: 120/53 mmHg (10/25 0930) Pulse Rate: 51  (10/25 0930)  Labs:  Basename 05/31/12 2210 05/31/12 2209  HGB 14.3 --  HCT 42.1 --  PLT 300 --  APTT 30 --  LABPROT 13.5 --  INR 1.04 --  HEPARINUNFRC -- --  CREATININE 1.70* --  CKTOTAL -- --  CKMB -- --  TROPONINI -- <0.30    The CrCl is unknown because both a height and weight (above a minimum accepted value) are required for this calculation.   Medical History: Past Medical History  Diagnosis Date  . Hypertension   . History of orthostatic hypotension   . IBS (irritable bowel syndrome)   . Parkinson's disease     ATYPICAL  . Dizziness   . Vertigo   . Fatigue   . Advanced age   . HLD (hyperlipidemia)     intolerant to lipid lowering drugs  . Normal nuclear stress test 2003  . CKD (chronic kidney disease)     Medications:  See electronic Med Rec  Assessment: 88yof to start Heparin and Coumadin bridge (Day 1 of minimum 5 Day overlap) for suspected PE (intermediate probability on VQ scan). No significant bleeding reported and no anticoagulants listed on Med Rec. - Baseline INR: 1.04 - H/H and Plts wnl - Heparin weight: 67.1kg - Warfarin points: 2  Goal of Therapy:  INR 2-3 Heparin level 0.3-0.7 units/ml Monitor platelets by anticoagulation protocol: Yes   Plan:  1. Heparin IV bolus 3500 units x 1 2. Heparin drip 1100 units/hr (11 ml/hr) 3. Check  heparin level 8 hours after initiation 4. Coumadin 5mg  po x 1 today 5. Daily INR, CBC, heparin level 6. Coumadin educational book and video  Cleon Dew [phone] 06/01/2012,9:54 AM

## 2012-06-01 NOTE — ED Provider Notes (Signed)
Pt required supplemental O2 and is still c/o R pleuritic lateral thoracic pain with dyspnea. Given intermediate prob on VQ with suspicious areas to R middle lobe will admit to [facility]. Pt is HD stable  Loren Racer, MD 06/01/12 [phone]

## 2012-06-02 LAB — BASIC METABOLIC PANEL
CO2: 20 mEq/L (ref 19–32)
Calcium: 8.3 mg/dL — ABNORMAL LOW (ref 8.4–10.5)
GFR calc Af Amer: 33 mL/min — ABNORMAL LOW (ref >90)
Sodium: 138 mEq/L (ref 135–145)

## 2012-06-02 LAB — CBC
MCV: 89.6 fL (ref 78.0–100.0)
Platelets: 251 10*3/uL (ref 150–400)
RBC: 3.84 MIL/uL — ABNORMAL LOW (ref 3.87–5.11)
WBC: 8.7 10*3/uL (ref 4.0–10.5)

## 2012-06-02 LAB — PROTIME-INR
INR: 1.19 (ref 0.00–1.49)
Prothrombin Time: 14.9 seconds (ref 11.6–15.2)

## 2012-06-02 LAB — HEPARIN LEVEL (UNFRACTIONATED): Heparin Unfractionated: 0.7 IU/mL (ref 0.30–0.70)

## 2012-06-02 MED ORDER — DEXTROSE 5 % IV SOLN
1.0000 g | INTRAVENOUS | Status: DC
  Administered 2012-06-02 – 2012-06-04 (×3): 1 g via INTRAVENOUS
  Filled 2012-06-02 (×4): qty 10

## 2012-06-02 MED ORDER — METOPROLOL SUCCINATE ER 25 MG PO TB24
25.0000 mg | ORAL_TABLET | Freq: Two times a day (BID) | ORAL | Status: DC
  Administered 2012-06-02 – 2012-06-04 (×4): 25 mg via ORAL
  Filled 2012-06-02 (×5): qty 1

## 2012-06-02 MED ORDER — WARFARIN SODIUM 5 MG PO TABS
5.0000 mg | ORAL_TABLET | Freq: Once | ORAL | Status: AC
  Administered 2012-06-02: 5 mg via ORAL
  Filled 2012-06-02: qty 1

## 2012-06-02 MED ORDER — HYDRALAZINE HCL 50 MG PO TABS
50.0000 mg | ORAL_TABLET | Freq: Three times a day (TID) | ORAL | Status: DC
  Administered 2012-06-02 – 2012-06-03 (×3): 50 mg via ORAL
  Filled 2012-06-02 (×5): qty 1

## 2012-06-02 MED ORDER — FLUOXETINE HCL 20 MG PO CAPS
20.0000 mg | ORAL_CAPSULE | Freq: Every day | ORAL | Status: DC
  Administered 2012-06-03 – 2012-06-09 (×7): 20 mg via ORAL
  Filled 2012-06-02 (×8): qty 1

## 2012-06-02 NOTE — Progress Notes (Addendum)
[facility] PROGRESS NOTE  Nicole Harding VHQ:[id number] DOB: [birth year] DOA: 05/31/2012 PCP: [name], MD  Assessment/Plan:  *Pulmonary embolism:  VQ scan was interpreted as intermediate acute pulmonary embolism, however, the patient has recent history of stasis and has been on estrogen therapy.  She states, however, that she has significant balance problems and has had multiple falls at home.  Given concern about falls risk, spoke with radiologist again today about level of concern for PE based on VQ scan and upon review it was felt that she likely has a PE and that the risk of contrast induced nephropathy even with reduced volume of contrast was unnecessary.  Her lower extremity duplex was negative and she does not have evidence of right heart dysfunction on ECHO. -  Family states that they cannot do injections at home of lovenox and prefer heparin gtt -  Coumadin per pharmacy  Nausea/vomiting/diarrhea:  Daily morning nausea and emesis and diarrhea frequently with incontinence.  She attributes these symptoms to her fluoxetine because they worsen when the dose is increased and improve when dose decreased.  She does not have diabetes to suggest gastroparesis.  She does travel.  She has history of IBS. -  Stool culture, O&P -  C. Dif -  Reduce dose of fluoxetine gradually  -  Zofran prn  UTI (urinary tract infection), urine culture grew mixed flora.  Patient asymptomatic.   - DC rocephin -  Monitor for symptoms of UTI  Pain in left shoulder And left hip likely secondary to mechanical fall.  No fracture or dislocation on the x-rays   CKD (chronic kidney disease) stage 4, GFR 15-29 ml/min.  Avoid nephrotoxins.   Accelerated hypertension:  Use larger cuff.   - Restart hydralazine and metoprolol   Mechanical Fall: Patient had accidentally tripped over a carpet, she endorses balance problems and falls.  - PT evaluation  Anemia, significant drop in all blood counts from  two days ago.  Likely dilutational and hemoglobin only mildly depressed.  Monitor for signs of bleeding. -  Repeat CBC in AM or sooner if symptoms or signs of bleeding occur  DIET:  Vegetarian per patient request ACCESS:  PIV IVF:  OFF PROPH:  Heparin bridge to coumadin  Code Status: Full code Family Communication:  Spoke with patient and husband  Disposition Plan:  Pending therapeutic INR and PT evaluation, likely to home.   Consultants:  None  Procedures:  NM VQ scan 10/25  Antibiotics:  None given  HPI/Subjective:  Patient states that she continues to feel about the same as yesterday.  She relayed that she has had problems with balance at home even using her walker.  Additionally, she has daily fecal incontinence with diarrhea and nausea and vomiting that usually occur after she takes her home medications.    Objective: Filed Vitals:   06/01/12 2100 06/02/12 0500 06/02/12 0940 06/02/12 0946  BP: 216/70 168/65 185/75   Pulse: 65 59  65  Temp: 97.5 F (36.4 C) 97.6 F (36.4 C)    TempSrc: Oral Oral    Resp:      Height:      Weight:      SpO2: 99% 99%      Intake/Output Summary (Last 24 hours) at 06/02/12 1100 Last data filed at 06/02/12 1000  Gross per 24 hour  Intake 1228.3 ml  Output    650 ml  Net  578.3 ml   Filed Weights   06/01/12 1005  Weight: 65.4 kg (  144 lb 2.9 oz)    Exam:   General:  Caucasian female, sitting comfortably in bed, no acute distress  HEENT:  MMM  Cardiovascular:  Bradycardic, regular rhythm, 2/6 systolic murmur at LSB, no rubs or gallops  Respiratory: CTAB  Abdomen: NABS, soft, nondistended, nontender  MSK:  No LEE  Psych:  A&Ox4  Data Reviewed: Basic Metabolic Panel:  Lab 06/02/12 1478 05/31/12 2210  NA 138 133*  K 3.7 3.4*  CL 107 98  CO2 20 20  GLUCOSE 91 176*  BUN 29* 34*  CREATININE 1.60* 1.70*  CALCIUM 8.3* 9.0  MG -- --  PHOS -- --   Liver Function Tests: No results found for this basename:  AST:5,ALT:5,ALKPHOS:5,BILITOT:5,PROT:5,ALBUMIN:5 in the last 168 hours No results found for this basename: LIPASE:5,AMYLASE:5 in the last 168 hours No results found for this basename: AMMONIA:5 in the last 168 hours CBC:  Lab 06/02/12 0605 05/31/12 2210  WBC 8.7 12.4*  NEUTROABS -- 10.0*  HGB 11.7* 14.3  HCT 34.4* 42.1  MCV 89.6 90.0  PLT 251 300   Cardiac Enzymes:  Lab 05/31/12 2209  CKTOTAL --  CKMB --  CKMBINDEX --  TROPONINI <0.30   BNP (last 3 results) No results found for this basename: PROBNP:3 in the last 8760 hours CBG: No results found for this basename: GLUCAP:5 in the last 168 hours  Recent Results (from the past 240 hour(s))  URINE CULTURE     Status: Normal   Collection Time   05/31/12 10:11 PM      Component Value Range Status Comment   Specimen Description URINE, RANDOM   Final    Special Requests CX ADDED AT 2237   Final    Culture  Setup Time 05/31/2012 23:02   Final    Colony Count 40,000 COLONIES/ML   Final    Culture     Final    Value: Multiple bacterial morphotypes present, none predominant. Suggest appropriate recollection if clinically indicated.   Report Status 06/01/2012 FINAL   Final      Studies: Dg Chest 2 View  05/31/2012  *RADIOLOGY REPORT*  Clinical Data: Chest pain.  CHEST - 2 VIEW  Comparison: 05/31/2012 and 08/15/2005  Findings: Two views of the chest were obtained.  There are scattered calcifications throughout both sides of the chest which are unchanged.  There is stable prominence of a right structure. The right hilar structure is likely vascular in etiology and similar to an exam in 2007.  Heart size is normal.  No evidence for a pneumothorax.  IMPRESSION: Stable chest radiograph findings.  No acute disease.  Evidence for old granulomatous disease.   Original Report Authenticated By: Richarda Overlie, M.D.    Dg Chest 2 View  05/31/2012  *RADIOLOGY REPORT*  Clinical Data: Shortness of breath, sharp pain under the right ribs, no known  injury  CHEST - 2 VIEW  Comparison: 12/24/2007  Findings:  Unchanged cardiac silhouette and mediastinal contours with mild tortuosity of the thoracic aorta.  Calcified granulomas are again seen overlying the bilateral upper lungs.  The lungs appear hyperexpanded with flattening of bilateral hemidiaphragms.  No focal parenchymal opacities.  No definite pleural effusion or pneumothorax.  No definite acute osseous abnormalities.  IMPRESSION: 1.  Hyperexpanded lungs without definite acute cardiopulmonary disease. 2.  Stable sequela of prior granulomatous infection.   Original Report Authenticated By: Waynard Reeds, M.D.    Dg Ribs Unilateral Right  05/31/2012  *RADIOLOGY REPORT*  Clinical Data: Sharp pain under  right ribs, no known injury  RIGHT RIBS - 2 VIEW  Comparison: Chest radiograph - earlier same day; 12/24/2007  Findings:  Unchanged cardiac silhouette and mediastinal contours with atherosclerotic calcifications within a mildly tortuous thoracic aorta.  Calcified granuloma overlies the right upper lung.  No definite displaced right-sided rib fractures with special attention paid to the area demarcated by the radiopaque arrow.  IMPRESSION: No displaced right-sided rib fractures with special attention paid to the area demarcated by the radiopaque arrow.   Original Report Authenticated By: Waynard Reeds, M.D.    Nm Pulmonary Perf And Vent  06/01/2012  *RADIOLOGY REPORT*  Clinical Data: Shortness of breath and chest pain on the right. The patient status post fall 1 week ago with right rib trauma.  NM PULMONARY VENTILATION AND PERFUSION SCAN  Radiopharmaceutical: CURIE MAA TECHNETIUM TO 1M ALBUMIN AGGREGATED 38 mCi technetium 76m DTPA for ventilation  Comparison: Plain film chest 05/31/2012.  Findings: Multiple peripheral wedge shaped defects are seen on perfusion imaging.  Most of these are matched although matching appears incomplete in some locations such as the right middle lobe.  IMPRESSION:  Intermediate probability for pulmonary embolus.   Original Report Authenticated By: Bernadene Bell. D'ALESSIO, M.D.     Scheduled Meds:   . aspirin EC  81 mg Oral Daily  . coumadin book   Does not apply Once  . FLUoxetine  20 mg Oral Daily  . heparin  3,500 Units Intravenous Once  . hydrALAZINE  25 mg Oral TID  . influenza  inactive virus vaccine  0.5 mL Intramuscular Tomorrow-1000  . metoprolol succinate  25 mg Oral BID  . multivitamin with minerals  1 tablet Oral Daily  . sodium chloride  3 mL Intravenous Q12H  . warfarin  5 mg Oral ONCE-1800  . warfarin   Does not apply Once  . Warfarin - Pharmacist Dosing Inpatient   Does not apply q1800  . DISCONTD: aspirin  81 mg Oral Daily  . DISCONTD: FLUoxetine  40 mg Oral Daily   Continuous Infusions:   . sodium chloride 75 mL/hr at 06/02/12 0231  . heparin 1,000 Units/hr (06/02/12 0659)    Principal Problem:  *Pulmonary embolism Active Problems:  Irritable bowel syndrome  UTI (urinary tract infection)  Pain in left shoulder   Nausea  CKD (chronic kidney disease) stage 4, GFR 15-29 ml/min  Accelerated hypertension  Fall    Time spent: 40 including time spent talking with family and with radiology    Renae Fickle  [facility] Pager [phone]. If 8PM-8AM, please contact night-coverage at [email or web address], password [facility] 06/02/2012, 11:00 AM  LOS: 2 days

## 2012-06-02 NOTE — Progress Notes (Addendum)
ANTICOAGULATION CONSULT NOTE - Follow up Consult  Pharmacy Consult for warfarin Indication: pulmonary embolus  Allergies  Allergen Reactions  . Amlodipine Nausea And Vomiting and Swelling    Swelling --sick and ended up in hospital N&V  . Codeine Nausea And Vomiting  . Lipitor (Atorvastatin Calcium) Other (See Comments)    "cramps my legs"  . Lisinopril Other (See Comments)    unknown  . Red Yeast Rice Other (See Comments)    unknown  . Zetia (Ezetimibe) Other (See Comments)    unknown    Patient Measurements: Height: 5\' 5"  (165.1 cm) Weight: 144 lb 2.9 oz (65.4 kg) IBW/kg (Calculated) : 57    Vital Signs: Temp: 97.6 F (36.4 C) (10/26 0500) Temp src: Oral (10/26 0500) BP: 185/75 mmHg (10/26 0940) Pulse Rate: 65  (10/26 0946)  Labs:  Basename 06/02/12 0605 06/01/12 2048 05/31/12 2210 05/31/12 2209  HGB 11.7* -- 14.3 --  HCT 34.4* -- 42.1 --  PLT 251 -- 300 --  APTT -- -- 30 --  LABPROT 14.9 -- 13.5 --  INR 1.19 -- 1.04 --  HEPARINUNFRC 0.70 0.68 -- --  CREATININE 1.60* -- 1.70* --  CKTOTAL -- -- -- --  CKMB -- -- -- --  TROPONINI -- -- -- <0.30    Estimated Creatinine Clearance: 22.7 ml/min (by C-G formula based on Cr of 1.6).   Medical History: Past Medical History  Diagnosis Date  . Hypertension   . History of orthostatic hypotension   . IBS (irritable bowel syndrome)   . Parkinson's disease     ATYPICAL  . Dizziness   . Vertigo   . Fatigue   . Advanced age   . HLD (hyperlipidemia)     intolerant to lipid lowering drugs  . Normal nuclear stress test 2003  . Fall at home 05/2012    mechanical fall  (06/01/2012)  . Pulmonary embolism 05/31/2012  . Chronic kidney disease, stage 4 (severe)   . Exertional dyspnea   . Arthritis     "legs, fingers" (06/01/2012)     Assessment: [name] on Heparin and Coumadin bridge (Day 2 of minimum 5 Day overlap) for suspected PE. No significant bleeding reported.    Goal of Therapy:  INR 2-3 Monitor  platelets by anticoagulation protocol: Yes   Plan:  - warfarin 5mg  x1 today - Daily INR/CBC   Drue Stager 06/02/2012,2:07 PM

## 2012-06-02 NOTE — Progress Notes (Signed)
ANTICOAGULATION CONSULT NOTE - Follow Up Consult  Pharmacy Consult for heparin Indication: suspected PE  Allergies  Allergen Reactions  . Amlodipine Nausea And Vomiting and Swelling    Swelling --sick and ended up in hospital N&V  . Codeine Nausea And Vomiting  . Lipitor (Atorvastatin Calcium) Other (See Comments)    "cramps my legs"  . Lisinopril Other (See Comments)    unknown  . Red Yeast Rice Other (See Comments)    unknown  . Zetia (Ezetimibe) Other (See Comments)    unknown    Patient Measurements: Height: 5\' 5"  (165.1 cm) Weight: 144 lb 2.9 oz (65.4 kg) IBW/kg (Calculated) : 57  Heparin Dosing Weight: 67.1 kg  Vital Signs: Temp: 97.6 F (36.4 C) (10/26 0500) Temp src: Oral (10/26 0500) BP: 168/65 mmHg (10/26 0500) Pulse Rate: 59  (10/26 0500)  Labs:  Basename 06/02/12 0605 06/01/12 2048 05/31/12 2210 05/31/12 2209  HGB -- -- 14.3 --  HCT -- -- 42.1 --  PLT -- -- 300 --  APTT -- -- 30 --  LABPROT 14.9 -- 13.5 --  INR 1.19 -- 1.04 --  HEPARINUNFRC 0.70 0.68 -- --  CREATININE -- -- 1.70* --  CKTOTAL -- -- -- --  CKMB -- -- -- --  TROPONINI -- -- -- <0.30    Estimated Creatinine Clearance: 21.4 ml/min (by C-G formula based on Cr of 1.7).   Medications:  Scheduled:    . aspirin EC  81 mg Oral Daily  . coumadin book   Does not apply Once  . FLUoxetine  40 mg Oral Daily  . heparin  3,500 Units Intravenous Once  . hydrALAZINE  25 mg Oral TID  . influenza  inactive virus vaccine  0.5 mL Intramuscular Tomorrow-1000  . metoprolol succinate  25 mg Oral BID  . multivitamin with minerals  1 tablet Oral Daily  . sodium chloride  3 mL Intravenous Q12H  . warfarin  5 mg Oral ONCE-1800  . warfarin   Does not apply Once  . Warfarin - Pharmacist Dosing Inpatient   Does not apply q1800  . DISCONTD: aspirin  81 mg Oral Daily   Infusions:    . sodium chloride 75 mL/hr at 06/02/12 0231  . heparin 1,100 Units/hr (06/02/12 4098)    Assessment: 78 yo female  with suspected PE is currently on therapeutic heparin.  Heparin level 0.7 Goal of Therapy:  Heparin level 0.3-0.7 units/ml Monitor platelets by anticoagulation protocol: Yes   Plan:  1) Reduce heparin to 1000 units/hr (= 10 ml/hr) 2) Heparin level and CBC in the am  Deshante Cassell, Tsz-Yin 06/02/2012,6:52 AM

## 2012-06-03 LAB — BASIC METABOLIC PANEL
BUN: 24 mg/dL — ABNORMAL HIGH (ref 6–23)
Calcium: 8.5 mg/dL (ref 8.4–10.5)
Creatinine, Ser: 1.62 mg/dL — ABNORMAL HIGH (ref 0.50–1.10)
GFR calc Af Amer: 32 mL/min — ABNORMAL LOW (ref >90)
GFR calc non Af Amer: 28 mL/min — ABNORMAL LOW (ref >90)

## 2012-06-03 LAB — CBC
Hemoglobin: 12.1 g/dL (ref 12.0–15.0)
MCHC: 33.7 g/dL (ref 30.0–36.0)
Platelets: 317 10*3/uL (ref 150–400)
RDW: 13.5 % (ref 11.5–15.5)

## 2012-06-03 LAB — HEPARIN LEVEL (UNFRACTIONATED): Heparin Unfractionated: 0.51 IU/mL (ref 0.30–0.70)

## 2012-06-03 LAB — PROTIME-INR
INR: 1.31 (ref 0.00–1.49)
Prothrombin Time: 16 seconds — ABNORMAL HIGH (ref 11.6–15.2)

## 2012-06-03 MED ORDER — HYDRALAZINE HCL 50 MG PO TABS
100.0000 mg | ORAL_TABLET | Freq: Three times a day (TID) | ORAL | Status: DC
  Administered 2012-06-03 – 2012-06-09 (×18): 100 mg via ORAL
  Filled 2012-06-03 (×20): qty 2

## 2012-06-03 MED ORDER — WARFARIN SODIUM 5 MG PO TABS
5.0000 mg | ORAL_TABLET | Freq: Once | ORAL | Status: AC
  Administered 2012-06-03: 5 mg via ORAL
  Filled 2012-06-03: qty 1

## 2012-06-03 NOTE — Progress Notes (Signed)
ANTICOAGULATION CONSULT NOTE - Follow up Consult  Pharmacy Consult for heparin and warfarin Indication: pulmonary embolus  Allergies  Allergen Reactions  . Amlodipine Nausea And Vomiting and Swelling    Swelling --sick and ended up in hospital N&V  . Codeine Nausea And Vomiting  . Lipitor (Atorvastatin Calcium) Other (See Comments)    "cramps my legs"  . Lisinopril Other (See Comments)    unknown  . Red Yeast Rice Other (See Comments)    unknown  . Zetia (Ezetimibe) Other (See Comments)    unknown    Patient Measurements: Height: 5\' 5"  (165.1 cm) Weight: 144 lb 2.9 oz (65.4 kg) IBW/kg (Calculated) : 57    Vital Signs: Temp: 98.5 F (36.9 C) (10/27 0500) Temp src: Oral (10/27 0500) BP: 164/77 mmHg (10/27 0500) Pulse Rate: 64  (10/27 0500)  Labs:  Nicole Harding 06/03/12 0521 06/02/12 0605 06/01/12 2048 05/31/12 2210 05/31/12 2209  HGB 12.1 11.7* -- -- --  HCT 35.9* 34.4* -- 42.1 --  PLT 317 251 -- 300 --  APTT -- -- -- 30 --  LABPROT 16.0* 14.9 -- 13.5 --  INR 1.31 1.19 -- 1.04 --  HEPARINUNFRC 0.51 0.70 0.68 -- --  CREATININE 1.62* 1.60* -- 1.70* --  CKTOTAL -- -- -- -- --  CKMB -- -- -- -- --  TROPONINI -- -- -- -- <0.30    Estimated Creatinine Clearance: 22.4 ml/min (by C-G formula based on Cr of 1.62).   Medical History: Past Medical History  Diagnosis Date  . Hypertension   . History of orthostatic hypotension   . IBS (irritable bowel syndrome)   . Parkinson's disease     ATYPICAL  . Dizziness   . Vertigo   . Fatigue   . Advanced age   . HLD (hyperlipidemia)     intolerant to lipid lowering drugs  . Normal nuclear stress test 2003  . Fall at home 05/2012    mechanical fall  (06/01/2012)  . Pulmonary embolism 05/31/2012  . Chronic kidney disease, stage 4 (severe)   . Exertional dyspnea   . Arthritis     "legs, fingers" (06/01/2012)     Assessment: [name] on Heparin and Coumadin bridge (Day 3 of minimum 5 Day overlap) for suspected PE. No  significant bleeding reported. Heparin level currently at goal at 0.51. INR trending up slowly, currently at 1.31.   Goal of Therapy:  Heparin level 0.3-0.7 units/ml INR 2-3 Monitor platelets by anticoagulation protocol: Yes   Plan:  - Continue heparin 1000 units/hr (= 96ml/hr) - warfarin 5mg  x1 today - Daily INR, CBC, and heparin level   Drue Stager 06/03/2012,8:03 AM

## 2012-06-03 NOTE — Progress Notes (Signed)
[facility] PROGRESS NOTE  Nicole Harding ZOX:[id number] DOB: [birth year] DOA: 05/31/2012 PCP: [name], MD  Assessment/Plan:  *Pulmonary embolism:  VQ scan was interpreted as intermediate acute pulmonary embolism, however, after reviewing with radiology, feel that PE is likely, and patient has recent history of stasis and has been on estrogen therapy.  Her lower extremity duplex was negative and she does not have evidence of right heart dysfunction on ECHO. -  Family states that they cannot do injections at home of lovenox and prefer heparin gtt -  Coumadin per pharmacy  Nausea/vomiting/diarrhea:  States she has not had diarrhea since she has been here and nausea was better today on reduced dose of fluoxetine.  She has history of IBS. -  Patient has not had diarrhea in several days which is inconsistent with C. Diff infection.  Will d/c stool studies and contact precautions.  She may follow up as outpatient.   -  Reduce dose of fluoxetine gradually  -  Zofran prn  UTI (urinary tract infection), urine culture grew mixed flora.  Patient asymptomatic.   -  Monitor for symptoms of UTI  Pain in left shoulder And left hip likely secondary to mechanical fall.  No fracture or dislocation on the x-rays   CKD (chronic kidney disease) stage 4, GFR 15-29 ml/min.  Avoid nephrotoxins.   Accelerated hypertension:  Blood pressures still elevated - Increase hydralazine  -  Continue metoprolol   Mechanical Fall: Patient had accidentally tripped over a carpet, she endorses balance problems and falls.  - PT evaluation -  UP to chair -  Up with assistance -  Fall precautions  Anemia, significant drop in all blood count on 10/26, but stable on 10/27.  Likely dilutational and hemoglobin only mildly depressed.   -  Repeat CBC if signs of bleeding.  DIET:  Vegetarian per patient request ACCESS:  PIV IVF:  OFF PROPH:  Heparin bridge to coumadin  Code Status: Full code Family  Communication:  Spoke with patient and husband and grandson Disposition Plan:  Pending therapeutic INR and PT evaluation, likely to home.  PT consultation pending.   Consultants:  None  Procedures:  NM VQ scan 10/25  Antibiotics:  None given  HPI/Subjective:  Patient states that she continues to feel weak.  She has been in bed for the last few days.  She states that she has not had diarrhea since she has been here and that her nausea was improved this morning on the lower dose of fluoxetine.  She had some shortness of breath that improved with nasal canula.  Denies chest pressure or tightness  Objective: Filed Vitals:   06/03/12 0500 06/03/12 0953 06/03/12 1400 06/03/12 1715  BP: 164/77 159/79 172/81 143/76  Pulse: 64 55 59   Temp: 98.5 F (36.9 C)  97.9 F (36.6 C)   TempSrc: Oral     Resp: 18  20   Height:      Weight:      SpO2: 97%  97%     Intake/Output Summary (Last 24 hours) at 06/03/12 1736 Last data filed at 06/03/12 1300  Gross per 24 hour  Intake    243 ml  Output      0 ml  Net    243 ml   Filed Weights   06/01/12 1005  Weight: 65.4 kg (144 lb 2.9 oz)    Exam:   General:  Caucasian female, lying in bed, no acute distress  HEENT:  MMM  Cardiovascular:  Bradycardic, regular rhythm, 2/6 systolic murmur at LSB  Respiratory: CTAB  Abdomen: NABS, soft, nondistended, nontender  MSK:  No LEE  Psych:  A&Ox4  Skin: No new bruises  Data Reviewed: Basic Metabolic Panel:  Lab 06/03/12 1610 06/02/12 0605 05/31/12 2210  NA 135 138 133*  K 4.0 3.7 3.4*  CL 104 107 98  CO2 19 20 20   GLUCOSE 94 91 176*  BUN 24* 29* 34*  CREATININE 1.62* 1.60* 1.70*  CALCIUM 8.5 8.3* 9.0  MG -- -- --  PHOS -- -- --   Liver Function Tests: No results found for this basename: AST:5,ALT:5,ALKPHOS:5,BILITOT:5,PROT:5,ALBUMIN:5 in the last 168 hours No results found for this basename: LIPASE:5,AMYLASE:5 in the last 168 hours No results found for this basename:  AMMONIA:5 in the last 168 hours CBC:  Lab 06/03/12 0521 06/02/12 0605 05/31/12 2210  WBC 9.7 8.7 12.4*  NEUTROABS -- -- 10.0*  HGB 12.1 11.7* 14.3  HCT 35.9* 34.4* 42.1  MCV 89.8 89.6 90.0  PLT 317 251 300   Cardiac Enzymes:  Lab 05/31/12 2209  CKTOTAL --  CKMB --  CKMBINDEX --  TROPONINI <0.30   BNP (last 3 results) No results found for this basename: PROBNP:3 in the last 8760 hours CBG: No results found for this basename: GLUCAP:5 in the last 168 hours  Recent Results (from the past 240 hour(s))  URINE CULTURE     Status: Normal   Collection Time   05/31/12 10:11 PM      Component Value Range Status Comment   Specimen Description URINE, RANDOM   Final    Special Requests CX ADDED AT 2237   Final    Culture  Setup Time 05/31/2012 23:02   Final    Colony Count 40,000 COLONIES/ML   Final    Culture     Final    Value: Multiple bacterial morphotypes present, none predominant. Suggest appropriate recollection if clinically indicated.   Report Status 06/01/2012 FINAL   Final      Studies: No results found.  Scheduled Meds:    . aspirin EC  81 mg Oral Daily  . cefTRIAXone (ROCEPHIN)  IV  1 g Intravenous Q24H  . FLUoxetine  20 mg Oral Daily  . hydrALAZINE  100 mg Oral TID  . influenza  inactive virus vaccine  0.5 mL Intramuscular Tomorrow-1000  . metoprolol succinate  25 mg Oral BID  . multivitamin with minerals  1 tablet Oral Daily  . sodium chloride  3 mL Intravenous Q12H  . warfarin  5 mg Oral ONCE-1800  . warfarin  5 mg Oral ONCE-1800  . Warfarin - Pharmacist Dosing Inpatient   Does not apply q1800  . DISCONTD: hydrALAZINE  50 mg Oral TID   Continuous Infusions:    . heparin 1,000 Units/hr (06/03/12 0950)    Principal Problem:  *Pulmonary embolism Active Problems:  Irritable bowel syndrome  UTI (urinary tract infection)  Pain in left shoulder   Nausea  CKD (chronic kidney disease) stage 4, GFR 15-29 ml/min  Accelerated hypertension   Fall    Time spent: 40 including time spent talking with family and with radiology    Renae Fickle  [facility] Pager [phone]. If 8PM-8AM, please contact night-coverage at [email or web address], password [facility] 06/03/2012, 5:36 PM  LOS: 3 days

## 2012-06-04 LAB — PROTIME-INR
INR: 1.83 — ABNORMAL HIGH (ref 0.00–1.49)
Prothrombin Time: 20.5 seconds — ABNORMAL HIGH (ref 11.6–15.2)

## 2012-06-04 LAB — URINE CULTURE

## 2012-06-04 LAB — HEPARIN LEVEL (UNFRACTIONATED): Heparin Unfractionated: 0.51 IU/mL (ref 0.30–0.70)

## 2012-06-04 MED ORDER — SACCHAROMYCES BOULARDII 250 MG PO CAPS
250.0000 mg | ORAL_CAPSULE | Freq: Two times a day (BID) | ORAL | Status: DC
  Administered 2012-06-04 – 2012-06-08 (×9): 250 mg via ORAL
  Filled 2012-06-04 (×11): qty 1

## 2012-06-04 MED ORDER — ONDANSETRON HCL 4 MG/2ML IJ SOLN
4.0000 mg | Freq: Three times a day (TID) | INTRAMUSCULAR | Status: AC
  Administered 2012-06-04 – 2012-06-06 (×6): 4 mg via INTRAVENOUS
  Filled 2012-06-04 (×6): qty 2

## 2012-06-04 MED ORDER — WARFARIN SODIUM 4 MG PO TABS
4.0000 mg | ORAL_TABLET | Freq: Once | ORAL | Status: AC
  Administered 2012-06-04: 4 mg via ORAL
  Filled 2012-06-04: qty 1

## 2012-06-04 MED ORDER — LABETALOL HCL 200 MG PO TABS
200.0000 mg | ORAL_TABLET | Freq: Two times a day (BID) | ORAL | Status: DC
  Administered 2012-06-04 – 2012-06-05 (×2): 200 mg via ORAL
  Filled 2012-06-04 (×4): qty 1

## 2012-06-04 NOTE — Progress Notes (Signed)
[facility] PROGRESS NOTE  [name] WUJ:[id number] DOB: 1924/05/19 DOA: 05/31/2012 PCP: [name], MD  Assessment/Plan:  *Pulmonary embolism:  VQ scan was interpreted as intermediate acute pulmonary embolism, however, after reviewing with radiology, feel that PE is likely, and patient has recent history of stasis and has been on estrogen therapy.  Her lower extremity duplex was negative and she does not have evidence of right heart dysfunction on ECHO.  -  Family states that they cannot do injections at home of lovenox and prefer heparin gtt -  Coumadin per pharmacy, INR not yet at goal  Nausea/vomiting/diarrhea:  Initially improved on decreased dose of fluoxetine, however, last night, she had severe nausea and vomiting again.  DDx includes central etiology versus motility disorder versus infection.  Diarrhea less likely related to central process. -  GI consultation:  Appreciate recommendations  -  C diff test -  Reduce dose of fluoxetine gradually:  Day 2/7 of 20mg  dose -  Zofran scheduled  UTI (urinary tract infection), urine culture grew mixed flora.  Patient asymptomatic.   -  Monitor for symptoms of UTI  Pain in left shoulder And left hip likely secondary to mechanical fall.  No fracture or dislocation on the x-rays   CKD (chronic kidney disease) stage 4, GFR 15-29 ml/min.  Avoid nephrotoxins.   Accelerated hypertension:  Blood pressures still elevated -  Continue hydralazine  -  Change metoprolol to labetalol  Mechanical Fall: Patient had accidentally tripped over a carpet, she endorses balance problems and falls.  - PT evaluation:  Home health PT with 24 hour supervision.   -  Up to chair -  Up with assistance -  Fall precautions  Anemia, significant drop in all blood count on 10/26, but stable on 10/27.  Likely dilutational and hemoglobin only mildly depressed.   -  Repeat CBC if signs of bleeding.  DIET:  Vegetarian per patient request ACCESS:   PIV IVF:  OFF PROPH:  Heparin bridge to coumadin  Code Status: Full code Family Communication:  Spoke with patient and husband and grandson Disposition Plan:  Pending therapeutic INR and PT evaluation, to home with home health PT   Consultants:  None  Procedures:  NM VQ scan 10/25  Antibiotics:  None given  HPI/Subjective:  Patient states that she continues to feel weak.  Although she felt much better yesterday, she states she woke up in the middle of the night with nausea and vomiting.  She vomited mucous which was nonbilious and nonbloody.  She had a small BM this morning.  She had some shortness of breath that improved with nasal canula.  Denies chest pressure or tightness.  Objective: Filed Vitals:   06/03/12 2057 06/03/12 2100 06/04/12 0500 06/04/12 1400  BP: 166/75 166/75 134/74 164/74  Pulse: 62 62 64 62  Temp:  97.1 F (36.2 C) 98.3 F (36.8 C) 98.1 F (36.7 C)  TempSrc:  Oral Oral Oral  Resp:  18 18 18   Height:      Weight:      SpO2:  98% 96% 93%    Intake/Output Summary (Last 24 hours) at 06/04/12 1712 Last data filed at 06/03/12 1936  Gross per 24 hour  Intake    360 ml  Output    150 ml  Net    210 ml   Filed Weights   06/01/12 1005  Weight: 65.4 kg (144 lb 2.9 oz)    Exam:   General:  Caucasian female, lying in  bed, no acute distress  HEENT:  MMM  Cardiovascular:  Bradycardic, regular rhythm, 2/6 systolic murmur at LSB  Respiratory: CTAB  Abdomen: NABS, soft, nondistended, nontender  MSK:  No LEE  Psych:  A&Ox4, although she did not remember that I had been introduced to her husband despite several group coversations  Skin: No new bruises  Data Reviewed: Basic Metabolic Panel:  Lab 06/03/12 4540 06/02/12 0605 05/31/12 2210  NA 135 138 133*  K 4.0 3.7 3.4*  CL 104 107 98  CO2 19 20 20   GLUCOSE 94 91 176*  BUN 24* 29* 34*  CREATININE 1.62* 1.60* 1.70*  CALCIUM 8.5 8.3* 9.0  MG -- -- --  PHOS -- -- --   Liver Function  Tests: No results found for this basename: AST:5,ALT:5,ALKPHOS:5,BILITOT:5,PROT:5,ALBUMIN:5 in the last 168 hours No results found for this basename: LIPASE:5,AMYLASE:5 in the last 168 hours No results found for this basename: AMMONIA:5 in the last 168 hours CBC:  Lab 06/03/12 0521 06/02/12 0605 05/31/12 2210  WBC 9.7 8.7 12.4*  NEUTROABS -- -- 10.0*  HGB 12.1 11.7* 14.3  HCT 35.9* 34.4* 42.1  MCV 89.8 89.6 90.0  PLT 317 251 300   Cardiac Enzymes:  Lab 05/31/12 2209  CKTOTAL --  CKMB --  CKMBINDEX --  TROPONINI <0.30   BNP (last 3 results) No results found for this basename: PROBNP:3 in the last 8760 hours CBG: No results found for this basename: GLUCAP:5 in the last 168 hours  Recent Results (from the past 240 hour(s))  URINE CULTURE     Status: Normal   Collection Time   05/31/12 10:11 PM      Component Value Range Status Comment   Specimen Description URINE, RANDOM   Final    Special Requests CX ADDED AT 2237   Final    Culture  Setup Time 05/31/2012 23:02   Final    Colony Count 40,000 COLONIES/ML   Final    Culture     Final    Value: Multiple bacterial morphotypes present, none predominant. Suggest appropriate recollection if clinically indicated.   Report Status 06/01/2012 FINAL   Final   URINE CULTURE     Status: Normal   Collection Time   06/02/12  5:22 PM      Component Value Range Status Comment   Specimen Description URINE, CLEAN CATCH   Final    Special Requests NONE   Final    Culture  Setup Time 06/03/2012 02:00   Final    Colony Count 40,000 COLONIES/ML   Final    Culture     Final    Value: LACTOBACILLUS SPECIES     Note: Standardized susceptibility testing for this organism is not available.   Report Status 06/04/2012 FINAL   Final      Studies: No results found.  Scheduled Meds:    . aspirin EC  81 mg Oral Daily  . cefTRIAXone (ROCEPHIN)  IV  1 g Intravenous Q24H  . FLUoxetine  20 mg Oral Daily  . hydrALAZINE  100 mg Oral TID  .  metoprolol succinate  25 mg Oral BID  . multivitamin with minerals  1 tablet Oral Daily  . ondansetron (ZOFRAN) IV  4 mg Intravenous Q8H  . saccharomyces boulardii  250 mg Oral BID  . sodium chloride  3 mL Intravenous Q12H  . warfarin  4 mg Oral ONCE-1800  . warfarin  5 mg Oral ONCE-1800  . Warfarin - Pharmacist Dosing Inpatient  Does not apply q1800   Continuous Infusions:    . heparin 1,000 Units/hr (06/04/12 1049)    Principal Problem:  *Pulmonary embolism Active Problems:  Irritable bowel syndrome  UTI (urinary tract infection)  Pain in left shoulder   Nausea  CKD (chronic kidney disease) stage 4, GFR 15-29 ml/min  Accelerated hypertension  Fall    Time spent: 30 minutes    Nicole Harding, [facility]  [facility] Pager [phone]. If 8PM-8AM, please contact night-coverage at [email or web address], password [facility] 06/04/2012, 5:12 PM  LOS: 4 days

## 2012-06-04 NOTE — Progress Notes (Signed)
ANTICOAGULATION CONSULT NOTE - Follow Up Consult  Pharmacy Consult for Heparin and Coumadin Indication: Suspected PE  Allergies  Allergen Reactions  . Amlodipine Nausea And Vomiting and Swelling    Swelling --sick and ended up in hospital N&V  . Codeine Nausea And Vomiting  . Lipitor (Atorvastatin Calcium) Other (See Comments)    "cramps my legs"  . Lisinopril Other (See Comments)    unknown  . Red Yeast Rice Other (See Comments)    unknown  . Zetia (Ezetimibe) Other (See Comments)    unknown    Patient Measurements: Height: 5\' 5"  (165.1 cm) Weight: 144 lb 2.9 oz (65.4 kg) IBW/kg (Calculated) : 57  Heparin Dosing Weight: 65.4kg  Vital Signs: Temp: 98.3 F (36.8 C) (10/28 0500) Temp src: Oral (10/28 0500) BP: 134/74 mmHg (10/28 0500) Pulse Rate: 64  (10/28 0500)  Labs:  Basename 06/04/12 0535 06/03/12 0521 06/02/12 0605  HGB -- 12.1 11.7*  HCT -- 35.9* 34.4*  PLT -- 317 251  APTT -- -- --  LABPROT 20.5* 16.0* 14.9  INR 1.83* 1.31 1.19  HEPARINUNFRC 0.51 0.51 0.70  CREATININE -- 1.62* 1.60*  CKTOTAL -- -- --  CKMB -- -- --  TROPONINI -- -- --    Estimated Creatinine Clearance: 22.4 ml/min (by C-G formula based on Cr of 1.62).   Medications:  Heparin 1000 units/hr   Assessment: 88yof on Heparin bridging to Coumadin (Day 4 of minimum 5 Day overlap) for suspected PE. Heparin level (0.51) remains therapeutic - continue current rate. INR (1.83) is subtherapeutic but increased significantly with Coumadin 5mg  x 3 doses. Will decrease dose slightly and follow-up AM INR. - No CBC this AM - No significant bleeding reported  Goal of Therapy:  INR 2-3 Heparin level 0.3-0.7 units/ml Monitor platelets by anticoagulation protocol: Yes   Plan:  1. Continue heparin drip 1000 units/hr (10 ml/hr) 2. Coumadin 4mg  po x 1 today 3. Follow-up AM INR, heparin level and CBC  Cleon Dew [phone] 06/04/2012,11:21 AM

## 2012-06-04 NOTE — Consult Note (Signed)
[facility] Consultation Note  Referring Provider: Renae Fickle, MD [facility]) Primary Care Physician:  [name], MD Primary Gastroenterologist:  Dr. Wandalee Ferdinand  Reason for Consultation:  Nausea, vomiting, diarrhea  HPI: Nicole Harding is a 78 y.o. female admitted with right-sided pleuritic chest pain, with V/Q and clinical history worrisome for pulmonary embolism, for which she was placed on heparin gtt and is being started on warfarin.  We were asked to see her for nausea, vomiting, diarrhea.  All of these are chronic issues, dating back at least 6 years, but seemingly worse over the past couple months.  To evaluate those symptoms in 2007, she had abdominal ultrasound (post-cholecystectomy), endoscopy (with gastric biopsies) and colonoscopy (with random colon biopsies negative for microscopic colitis).  Patient and husband describe worsening of these chronic symptoms over the past couple months.  Describes near-daily nausea, worse with movement (after getting up from seated/supine positive), no clear worsening after eating.  Some vomiting.  No pain or early satiety.  Diarrhea is also near-daily, non-bloody, typically only occurs 30 minutes after eating.  No nocturnal stool.  5 lbs weight loss.   Past Medical History  Diagnosis Date  . Hypertension   . History of orthostatic hypotension   . IBS (irritable bowel syndrome)   . Parkinson's disease     ATYPICAL  . Dizziness   . Vertigo   . Fatigue   . Advanced age   . HLD (hyperlipidemia)     intolerant to lipid lowering drugs  . Normal nuclear stress test 2003  . Fall at home 05/2012    mechanical fall  (06/01/2012)  . Pulmonary embolism 05/31/2012  . Chronic kidney disease, stage 4 (severe)   . Exertional dyspnea   . Arthritis     "legs, fingers" (06/01/2012)    Past Surgical History  Procedure Date  . Small intestine surgery   . Hemorrhoid surgery 1970    "have4 had accidents ever since" (06/01/2012)  .  Abdominal hysterectomy 1970's  . Tonsillectomy 1954  . Cholecystectomy 1955?  Marland Kitchen Breast cyst excision 1970's    left  . Cataract extraction w/ intraocular lens  implant, bilateral ? 1990's  . Shoulder open rotator cuff repair 1990?    right    Prior to Admission medications   Medication Sig Start Date End Date Taking? Authorizing Provider  aspirin 81 MG tablet Take 81 mg by mouth daily.   Yes Historical Provider, MD  estrogens, conjugated, (PREMARIN) 0.3 MG tablet Take 1 tablet (0.3 mg total) by mouth daily. 01/16/12  Yes [name], MD  FLUoxetine (PROZAC) 40 MG capsule Take 1 capsule (40 mg total) by mouth daily. 04/17/12 04/17/13 Yes Rosalio Macadamia, NP  hydrALAZINE (APRESOLINE) 25 MG tablet Take 1 tablet (25 mg total) by mouth 3 (three) times daily. 01/16/12 01/15/13 Yes [name], MD  hydrochlorothiazide (HYDRODIURIL) 25 MG tablet TAKE ONE-HALF (1/2) TABLET DAILY 09/28/11  Yes [name], MD  metoprolol succinate (TOPROL XL) 25 MG 24 hr tablet Take 1 tablet (25 mg total) by mouth 2 (two) times daily. 12/20/11  Yes [name], MD  Multiple Vitamin (MULITIVITAMIN WITH MINERALS) TABS Take 1 tablet by mouth daily.   Yes Historical Provider, MD  Multiple Vitamins-Minerals (ICAPS PO) Take by mouth daily.     Yes Historical Provider, MD  Polyethyl Glycol-Propyl Glycol (SYSTANE OP) Apply 1 drop to eye daily.   Yes Historical Provider, MD    Current Facility-Administered Medications  Medication Dose Route Frequency Provider Last Rate  Last Dose  . acetaminophen (TYLENOL) tablet 650 mg  650 mg Oral Q6H PRN Ripudeep Jenna Luo, MD       Or  . acetaminophen (TYLENOL) suppository 650 mg  650 mg Rectal Q6H PRN Ripudeep K Rai, MD      . alum & mag hydroxide-simeth (MAALOX/MYLANTA) 200-200-20 MG/5ML suspension 30 mL  30 mL Oral Q6H PRN Ripudeep K Rai, MD      . aspirin EC tablet 81 mg  81 mg Oral Daily Ripudeep K Rai, MD   81 mg at 06/04/12 1048  . cefTRIAXone (ROCEPHIN) 1 g in dextrose  5 % 50 mL IVPB  1 g Intravenous Q24H Ripudeep K Rai, MD   1 g at 06/03/12 2056  . FLUoxetine (PROZAC) capsule 20 mg  20 mg Oral Daily Renae Fickle, MD   20 mg at 06/04/12 1048  . heparin ADULT infusion 100 units/mL (25000 units/250 mL)  1,000 Units/hr Intravenous Continuous Renae Fickle, MD 10 mL/hr at 06/04/12 1049 1,000 Units/hr at 06/04/12 1049  . hydrALAZINE (APRESOLINE) tablet 100 mg  100 mg Oral TID Renae Fickle, MD   100 mg at 06/04/12 1048  . HYDROcodone-acetaminophen (NORCO/VICODIN) 5-325 MG per tablet 1-2 tablet  1-2 tablet Oral Q4H PRN Ripudeep K Rai, MD      . HYDROmorphone (DILAUDID) injection 1 mg  1 mg Intravenous Q4H PRN Ripudeep K Rai, MD      . metoprolol succinate (TOPROL-XL) 24 hr tablet 25 mg  25 mg Oral BID Renae Fickle, MD   25 mg at 06/04/12 1048  . multivitamin with minerals tablet 1 tablet  1 tablet Oral Daily Ripudeep Jenna Luo, MD   1 tablet at 06/04/12 1048  . ondansetron (ZOFRAN) tablet 4 mg  4 mg Oral Q6H PRN Ripudeep K Rai, MD       Or  . ondansetron (ZOFRAN) injection 4 mg  4 mg Intravenous Q6H PRN Ripudeep Jenna Luo, MD   4 mg at 06/04/12 0203  . sodium chloride 0.9 % injection 3 mL  3 mL Intravenous Q12H Ripudeep Jenna Luo, MD   3 mL at 06/03/12 0952  . warfarin (COUMADIN) tablet 4 mg  4 mg Oral ONCE-1800 Dannielle Karvonen [place], PHARMD      . warfarin (COUMADIN) tablet 5 mg  5 mg Oral ONCE-1800 Renae Fickle, MD   5 mg at 06/03/12 1719  . Warfarin - Pharmacist Dosing Inpatient   Does not apply q1800 Dannielle Karvonen [place], PHARMD      . DISCONTD: hydrALAZINE (APRESOLINE) tablet 50 mg  50 mg Oral TID Renae Fickle, MD   50 mg at 06/03/12 0955    Allergies as of 05/31/2012 - Review Complete 05/31/2012  Allergen Reaction Noted  . Amlodipine  12/13/2011  . Codeine Other (See Comments) 02/14/2011  . Lipitor (atorvastatin calcium) Other (See Comments) 02/14/2011  . Lisinopril Other (See Comments) 08/28/2011  . Red yeast rice Other (See Comments) 02/14/2011  .  Zetia (ezetimibe) Other (See Comments) 02/14/2011    Family History  Problem Relation Age of Onset  . Stroke Father     History   Social History  . Marital Status: Married    Spouse Name: N/A    Number of Children: N/A  . Years of Education: N/A   Occupational History  . Not on file.   Social History Main Topics  . Smoking status: Former Smoker -- 1.0 packs/day for 20 years    Types: Cigarettes    Quit date: 02/14/1971  .  Smokeless tobacco: Never Used  . Alcohol Use: No  . Drug Use: No  . Sexually Active: No   Other Topics Concern  . Not on file   Social History Narrative  . No narrative on file    Review of Systems: ROS Dr. Isidoro Donning 06/01/12 reviewed and I agree  Physical Exam: Vital signs in last 24 hours: Temp:  [97.1 F (36.2 C)-98.3 F (36.8 C)] 98.3 F (36.8 C) (10/28 0500) Pulse Rate:  [59-64] 64  (10/28 0500) Resp:  [18-20] 18  (10/28 0500) BP: (134-172)/(74-81) 134/74 mmHg (10/28 0500) SpO2:  [96 %-98 %] 96 % (10/28 0500) Last BM Date: 06/03/12 General:   Alert,  Well-developed,chronically ill-appearing but is in NAD Head:  Normocephalic and atraumatic. Eyes:  Sclera clear, no icterus.   Conjunctiva pink. Ears:  Normal auditory acuity. Nose:  No deformity, discharge,  or lesions. Mouth:  No deformity or lesions.  Oropharynx pink & somewhat dry. Neck:  Supple Lungs:  Clear throughout to auscultation.   No wheezes, crackles, or rhonchi. No acute distress. Heart:  Regular rate and rhythm; no murmurs, clicks, rubs,  or gallops. Abdomen:  Soft, nontender and nondistended. No masses, hepatosplenomegaly or hernias noted. Normal bowel sounds, without guarding, and without rebound.     Msk:  Diffusely atrophic musculature, non-focal, Symmetrical without gross deformities. Normal posture. Pulses:  Normal pulses noted. Extremities:  Without clubbing or edema. Neurologic:  Alert and  oriented x4; appears a bit confused at times with my questions Skin:  Intact  without significant lesions or rashes.  Scattered ecchymoses Psych:  Alert and cooperative. Depressed mood, flat affect   Lab Results:  Basename 06/03/12 0521 06/02/12 0605  WBC 9.7 8.7  HGB 12.1 11.7*  HCT 35.9* 34.4*  PLT 317 251   BMET  Basename 06/03/12 0521 06/02/12 0605  NA 135 138  K 4.0 3.7  CL 104 107  CO2 19 20  GLUCOSE 94 91  BUN 24* 29*  CREATININE 1.62* 1.60*  CALCIUM 8.5 8.3*   LFT No results found for this basename: PROT,ALBUMIN,AST,ALT,ALKPHOS,BILITOT,BILIDIR,IBILI in the last 72 hours PT/INR  Basename 06/04/12 0535 06/03/12 0521  LABPROT 20.5* 16.0*  INR 1.83* 1.31    Studies/Results: No results found.  Impression:  1.  Nausea.  Symptoms not readily referable to GI tract.  Worsening with movement suggestive of possible neurologic (vestibular) source.  There is MRI brain 2011 in our system, but I can't locate the images or exam interpretation. 2.  Diarrhea.   Symptoms most typical of malabsorptive process (such as from pancreatic insufficiency or bacterial overgrowth).  Post-cholecystectomy diarrhea can have similar symptoms, but her cholecystectomy was about 60 years ago, with symptoms only occuring about 6-10 years ago. 3.  Pulmonary embolism, by history and V/Q scan, on anticoagulation.  Wonder whether stress of this condition is worsening her chronic nausea and diarrhea symptoms.  Plan:  1.  Scheduled antiemetics for the next 48 hours to help try to break her nausea cycle. 2.  Reorder C. Diff studies to exclude this condition, especially now that she is on antibiotics. 3.  Trial of probiotics.  If this doesn't help, consider trial of pancreatic enzymes.  Given her warfarin administration, hesitant to try cholestyramine (which can bind to and affect bioavailability of many medications, including warfarin). 4.  UGI series tomorrow. 5.  Will follow.  Thank you for the consult.  I doubt we will be able to identify any clear cause into her chronic GI  symptoms during  this hospitalization.   LOS: 4 days   Shailey Butterbaugh M  06/04/2012, 1:11 PM

## 2012-06-04 NOTE — Evaluation (Signed)
Physical Therapy Evaluation Patient Details Name: Nicole Harding MRN: [id number] DOB: 08-14-1923 Today's Date: 06/04/2012 Time: 9563-8756 PT Time Calculation (min): 23 min  PT Assessment / Plan / Recommendation Clinical Impression  Pt 79 yo female with recent mechanical fall with h/o of vertigo and hypotension. Patient is at significant fall risk requiring use of RW and 24/7 supervision for safe mobility and ambulation. Patient safe to return home with 24/7 supervision. Patient reports spouse to be there at all times and will assist her. Pt has DME already at home. Pt to benefit from HHPT to improve strength and balance to minimize fall risk.    PT Assessment  Patient needs continued PT services    Follow Up Recommendations  Home health PT;Supervision/Assistance - 24 hour    Does the patient have the potential to tolerate intense rehabilitation      Barriers to Discharge None      Equipment Recommendations   (Has DME)    Recommendations for Other Services     Frequency Min 2X/week    Precautions / Restrictions Precautions Precautions: Fall Precaution Comments: pt unsteady upon initial stand Restrictions Weight Bearing Restrictions: No   Pertinent Vitals/Pain Pt denies pain       Mobility  Bed Mobility Bed Mobility: Supine to Sit Supine to Sit: 6: Modified independent (Device/Increase time) Details for Bed Mobility Assistance: long sit technique Transfers Transfers: Sit to Stand;Stand to Sit Sit to Stand: 4: Min assist;With upper extremity assist;From bed Stand to Sit: 5: Supervision;To chair/3-in-1;With armrests Details for Transfer Assistance: pt unsteady upon initial stand requiring modA to maintain balance. pt reports "I have equilibrium." Ambulation/Gait Ambulation/Gait Assistance: 4: Min guard Ambulation Distance (Feet): 150 Feet Assistive device: Rolling walker Ambulation/Gait Assistance Details: decreased cadence, requires RW for safe ambulation,  remains increase fall risk and is unsafe to ambulate I'ly Gait Pattern: Step-to pattern;Shuffle;Decreased step length - right;Decreased step length - left Gait velocity: decreased Stairs: No    Shoulder Instructions     Exercises     PT Diagnosis: Generalized weakness  PT Problem List: Decreased strength;Decreased balance PT Treatment Interventions: Stair training;Therapeutic exercise;Gait training   PT Goals Acute Rehab PT Goals PT Goal Formulation: With patient Time For Goal Achievement: 06/11/12 Potential to Achieve Goals: Good Pt will go Sit to Stand: with supervision;with upper extremity assist PT Goal: Sit to Stand - Progress: Goal set today Pt will Ambulate: >150 feet;with modified independence;with rolling walker PT Goal: Ambulate - Progress: Goal set today Pt will Go Up / Down Stairs: 1-2 stairs;with supervision;with rolling walker PT Goal: Up/Down Stairs - Progress: Goal set today  Visit Information  Last PT Received On: 06/04/12 Assistance Needed: +1    Subjective Data  Subjective: Pt reports "I'm weak as ever."   Prior Functioning  Home Living Lives With: Spouse Available Help at Discharge: Family Type of Home:  (town house) Home Access: Stairs to enter Secretary/administrator of Steps: 1 Entrance Stairs-Rails: None Home Layout: One level Bathroom Shower/Tub: Health visitor: Handicapped height Bathroom Accessibility: Yes Home Adaptive Equipment: Environmental consultant - rolling;Straight cane Prior Function Level of Independence: Needs assistance Needs Assistance: Bathing;Meal Prep Bath: Supervision/set-up Meal Prep: Supervision/set-up Able to Take Stairs?: Yes Driving: No Communication Communication: HOH (hearing aids, glasses) Dominant Hand: Left    Cognition  Overall Cognitive Status: History of cognitive impairments - at baseline Arousal/Alertness: Awake/alert Orientation Level: Appears intact for tasks assessed Behavior During Session: [facility]  for tasks performed    Extremity/Trunk Assessment Right Upper Extremity  Assessment RUE ROM/Strength/Tone: Within functional levels Left Upper Extremity Assessment LUE ROM/Strength/Tone: Within functional levels Right Lower Extremity Assessment RLE ROM/Strength/Tone: Within functional levels Left Lower Extremity Assessment LLE ROM/Strength/Tone: Within functional levels Trunk Assessment Trunk Assessment: Kyphotic (forward head)   Balance Balance Balance Assessed: Yes Static Standing Balance Static Standing - Balance Support: Bilateral upper extremity supported Static Standing - Level of Assistance: 4: Min assist Static Standing - Comment/# of Minutes: 1 min Dynamic Standing Balance Dynamic Standing - Balance Support: Bilateral upper extremity supported Dynamic Standing - Level of Assistance: 3: Mod assist Dynamic Standing - Balance Activities:  (marching in place)  End of Session PT - End of Session Equipment Utilized During Treatment: Gait belt Activity Tolerance: Patient tolerated treatment well Patient left: in chair;with call bell/phone within reach Nurse Communication: Mobility status  GP    Lewis Shock, PT, DPT Pager #: [phone] Office #: [phone]   Marcene Brawn 06/04/2012, 10:21 AM

## 2012-06-05 ENCOUNTER — Inpatient Hospital Stay (HOSPITAL_COMMUNITY): Payer: Medicare Other

## 2012-06-05 DIAGNOSIS — R6889 Other general symptoms and signs: Secondary | ICD-10-CM

## 2012-06-05 LAB — URINALYSIS, ROUTINE W REFLEX MICROSCOPIC
Bilirubin Urine: NEGATIVE
Glucose, UA: NEGATIVE mg/dL
Hgb urine dipstick: NEGATIVE
Nitrite: NEGATIVE
Specific Gravity, Urine: 1.014 (ref 1.005–1.030)
pH: 5.5 (ref 5.0–8.0)

## 2012-06-05 LAB — CBC WITH DIFFERENTIAL/PLATELET
Basophils Relative: 0 % (ref 0–1)
Eosinophils Absolute: 0.1 10*3/uL (ref 0.0–0.7)
MCH: 30.1 pg (ref 26.0–34.0)
MCHC: 33.4 g/dL (ref 30.0–36.0)
Neutrophils Relative %: 77 % (ref 43–77)
Platelets: 261 10*3/uL (ref 150–400)
RBC: 3.42 MIL/uL — ABNORMAL LOW (ref 3.87–5.11)

## 2012-06-05 LAB — GI PATHOGEN PANEL BY PCR, STOOL
C difficile toxin A/B: NEGATIVE
Campylobacter by PCR: NEGATIVE
Cryptosporidium by PCR: NEGATIVE
E coli (ETEC) LT/ST: NEGATIVE
E coli 0157 by PCR: NEGATIVE
G lamblia by PCR: NEGATIVE
Shigella by PCR: NEGATIVE

## 2012-06-05 LAB — OSMOLALITY: Osmolality: 281 mOsm/kg (ref 275–300)

## 2012-06-05 LAB — CBC
Hemoglobin: 11.1 g/dL — ABNORMAL LOW (ref 12.0–15.0)
MCH: 30.1 pg (ref 26.0–34.0)
MCHC: 33.1 g/dL (ref 30.0–36.0)
RDW: 14 % (ref 11.5–15.5)

## 2012-06-05 LAB — COMPREHENSIVE METABOLIC PANEL
ALT: 15 U/L (ref 0–35)
AST: 27 U/L (ref 0–37)
Albumin: 2.8 g/dL — ABNORMAL LOW (ref 3.5–5.2)
Alkaline Phosphatase: 53 U/L (ref 39–117)
Potassium: 3.5 mEq/L (ref 3.5–5.1)
Sodium: 131 mEq/L — ABNORMAL LOW (ref 135–145)
Total Protein: 5.6 g/dL — ABNORMAL LOW (ref 6.0–8.3)

## 2012-06-05 LAB — CLOSTRIDIUM DIFFICILE BY PCR: Toxigenic C. Difficile by PCR: NEGATIVE

## 2012-06-05 LAB — OSMOLALITY, URINE: Osmolality, Ur: 402 mOsm/kg (ref 390–1090)

## 2012-06-05 LAB — PROTIME-INR: Prothrombin Time: 25.4 seconds — ABNORMAL HIGH (ref 11.6–15.2)

## 2012-06-05 LAB — SODIUM, URINE, RANDOM: Sodium, Ur: 81 mEq/L

## 2012-06-05 LAB — HEPARIN LEVEL (UNFRACTIONATED): Heparin Unfractionated: 0.5 IU/mL (ref 0.30–0.70)

## 2012-06-05 MED ORDER — PANTOPRAZOLE SODIUM 40 MG PO TBEC
40.0000 mg | DELAYED_RELEASE_TABLET | Freq: Two times a day (BID) | ORAL | Status: DC
  Administered 2012-06-05 – 2012-06-11 (×13): 40 mg via ORAL
  Filled 2012-06-05 (×7): qty 1

## 2012-06-05 MED ORDER — SODIUM CHLORIDE 0.9 % IV BOLUS (SEPSIS)
250.0000 mL | Freq: Once | INTRAVENOUS | Status: AC
  Administered 2012-06-05: 250 mL via INTRAVENOUS

## 2012-06-05 MED ORDER — VANCOMYCIN HCL 1000 MG IV SOLR
750.0000 mg | INTRAVENOUS | Status: DC
  Administered 2012-06-05: 750 mg via INTRAVENOUS
  Filled 2012-06-05 (×2): qty 750

## 2012-06-05 MED ORDER — PIPERACILLIN-TAZOBACTAM 3.375 G IVPB
3.3750 g | Freq: Three times a day (TID) | INTRAVENOUS | Status: DC
  Administered 2012-06-05 – 2012-06-06 (×2): 3.375 g via INTRAVENOUS
  Filled 2012-06-05 (×5): qty 50

## 2012-06-05 MED ORDER — SODIUM CHLORIDE 0.9 % IV SOLN
INTRAVENOUS | Status: AC
  Administered 2012-06-05: 20:00:00 via INTRAVENOUS

## 2012-06-05 MED ORDER — WARFARIN SODIUM 1 MG PO TABS
1.0000 mg | ORAL_TABLET | Freq: Once | ORAL | Status: AC
  Administered 2012-06-05: 1 mg via ORAL
  Filled 2012-06-05: qty 1

## 2012-06-05 NOTE — Progress Notes (Signed)
ANTIBIOTIC CONSULT NOTE - INITIAL  Pharmacy Consult for Zosyn and Vancomycin Indication: Rigors, hypotension, sepsis?  Allergies  Allergen Reactions  . Amlodipine Nausea And Vomiting and Swelling    Swelling --sick and ended up in hospital N&V  . Codeine Nausea And Vomiting  . Lipitor (Atorvastatin Calcium) Other (See Comments)    "cramps my legs"  . Lisinopril Other (See Comments)    unknown  . Red Yeast Rice Other (See Comments)    unknown  . Zetia (Ezetimibe) Other (See Comments)    unknown    Patient Measurements: Height: 5\' 5"  (165.1 cm) Weight: 144 lb 2.9 oz (65.4 kg) IBW/kg (Calculated) : 57  Adjusted Body Weight:   Vital Signs: Temp: 97.7 F (36.5 C) (10/29 1432) Temp src: Oral (10/29 1432) BP: 97/58 mmHg (10/29 1432) Pulse Rate: 69  (10/29 1432) Intake/Output from previous day: 10/28 0701 - 10/29 0700 In: -  Out: 400 [Urine:400] Intake/Output from this shift:    Labs:  Basename 06/05/12 0540 06/03/12 0521  WBC 7.5 9.7  HGB 11.1* 12.1  PLT 274 317  LABCREA -- --  CREATININE -- 1.62*   Estimated Creatinine Clearance: 22.4 ml/min (by C-G formula based on Cr of 1.62). No results found for this basename: VANCOTROUGH:2,VANCOPEAK:2,VANCORANDOM:2,GENTTROUGH:2,GENTPEAK:2,GENTRANDOM:2,TOBRATROUGH:2,TOBRAPEAK:2,TOBRARND:2,AMIKACINPEAK:2,AMIKACINTROU:2,AMIKACIN:2, in the last 72 hours   Microbiology: Recent Results (from the past 720 hour(s))  URINE CULTURE     Status: Normal   Collection Time   05/31/12 10:11 PM      Component Value Range Status Comment   Specimen Description URINE, RANDOM   Final    Special Requests CX ADDED AT 2237   Final    Culture  Setup Time 05/31/2012 23:02   Final    Colony Count 40,000 COLONIES/ML   Final    Culture     Final    Value: Multiple bacterial morphotypes present, none predominant. Suggest appropriate recollection if clinically indicated.   Report Status 06/01/2012 FINAL   Final   URINE CULTURE     Status: Normal   Collection Time   06/02/12  5:22 PM      Component Value Range Status Comment   Specimen Description URINE, CLEAN CATCH   Final    Special Requests NONE   Final    Culture  Setup Time 06/03/2012 02:00   Final    Colony Count 40,000 COLONIES/ML   Final    Culture     Final    Value: LACTOBACILLUS SPECIES     Note: Standardized susceptibility testing for this organism is not available.   Report Status 06/04/2012 FINAL   Final   CLOSTRIDIUM DIFFICILE BY PCR     Status: Normal   Collection Time   06/04/12  8:30 PM      Component Value Range Status Comment   C difficile by pcr NEGATIVE  NEGATIVE Final     Medical History: Past Medical History  Diagnosis Date  . Hypertension   . History of orthostatic hypotension   . IBS (irritable bowel syndrome)   . Parkinson's disease     ATYPICAL  . Dizziness   . Vertigo   . Fatigue   . Advanced age   . HLD (hyperlipidemia)     intolerant to lipid lowering drugs  . Normal nuclear stress test 2003  . Fall at home 05/2012    mechanical fall  (06/01/2012)  . Pulmonary embolism 05/31/2012  . Chronic kidney disease, stage 4 (severe)   . Exertional dyspnea   . Arthritis     "  legs, fingers" (06/01/2012)    Medications:  Scheduled:    . aspirin EC  81 mg Oral Daily  . FLUoxetine  20 mg Oral Daily  . hydrALAZINE  100 mg Oral TID  . multivitamin with minerals  1 tablet Oral Daily  . ondansetron (ZOFRAN) IV  4 mg Intravenous Q8H  . pantoprazole  40 mg Oral BID AC  . piperacillin-tazobactam (ZOSYN)  IV  3.375 g Intravenous Q8H  . saccharomyces boulardii  250 mg Oral BID  . sodium chloride  250 mL Intravenous Once  . sodium chloride  3 mL Intravenous Q12H  . vancomycin  750 mg Intravenous Q24H  . warfarin  1 mg Oral ONCE-1800  . warfarin  4 mg Oral ONCE-1800  . Warfarin - Pharmacist Dosing Inpatient   Does not apply q1800  . DISCONTD: cefTRIAXone (ROCEPHIN)  IV  1 g Intravenous Q24H  . DISCONTD: labetalol  200 mg Oral BID  . DISCONTD:  metoprolol succinate  25 mg Oral BID   Assessment: 78 yr old female was admitted with right chest pain and left shoulder pain. She had fallen a couple of days earlier.  Pt was started on heparin and warfarin. Pt has now developed hypotension and rigors. To start Zosyn and Vanc out of concern for sepsis.  Goal of Therapy:  Vancomycin trough level 15-20 mcg/ml  Plan:  Zosyn 3.325 Gm IV q4h over 4 hours Vancomycin 750 mg IV q24 hrs. Levels when appropriate.  Eugene Garnet 06/05/2012,3:58 PM

## 2012-06-05 NOTE — Progress Notes (Addendum)
ANTICOAGULATION CONSULT NOTE - Follow Up Consult  Pharmacy Consult for Heparin and Coumadin Indication: Suspected PE  Allergies  Allergen Reactions  . Amlodipine Nausea And Vomiting and Swelling    Swelling --sick and ended up in hospital N&V  . Codeine Nausea And Vomiting  . Lipitor (Atorvastatin Calcium) Other (See Comments)    "cramps my legs"  . Lisinopril Other (See Comments)    unknown  . Red Yeast Rice Other (See Comments)    unknown  . Zetia (Ezetimibe) Other (See Comments)    unknown    Patient Measurements: Height: 5\' 5"  (165.1 cm) Weight: 144 lb 2.9 oz (65.4 kg) IBW/kg (Calculated) : 57  Heparin Dosing Weight: 65.4kg  Vital Signs: Temp: 97.2 F (36.2 C) (10/29 0500) Temp src: Oral (10/29 0500) BP: 135/71 mmHg (10/29 0500) Pulse Rate: 67  (10/29 0500)  Labs:  Basename 06/05/12 0540 06/04/12 0535 06/03/12 0521  HGB 11.1* -- 12.1  HCT 33.5* -- 35.9*  PLT 274 -- 317  APTT -- -- --  LABPROT 25.4* 20.5* 16.0*  INR 2.44* 1.83* 1.31  HEPARINUNFRC 0.50 0.51 0.51  CREATININE -- -- 1.62*  CKTOTAL -- -- --  CKMB -- -- --  TROPONINI -- -- --    Estimated Creatinine Clearance: 22.4 ml/min (by C-G formula based on Cr of 1.62).   Medications:  Heparin 1000 units/hr  Assessment: 88yof on Heparin bridging to Coumadin (Day 5 of minimum 5 Day overlap) for suspected PE. Heparin level (0.5) remains therapeutic - continue current rate and plan to discontinue in AM if INR remains therapeutic. INR (2.44) is therapeutic but continued to significantly increase - will reduce dose and follow-up AM INR. - H/H and Plts trended down - No significant bleeding reported  Goal of Therapy:  INR 2-3 Heparin level 0.3-0.7 units/ml Monitor platelets by anticoagulation protocol: Yes   Plan:  1. Coumadin 1 mg po x 1 today 2. Continue Heparin 1000 units/hr (10 ml/hr) - plan to discontinue in AM if INR remains therapeutic 3. Follow-up AM INR, CBC and heparin level 4. Monitor for  s/sx of bleeding  Cleon Dew [phone] 06/05/2012,1:47 PM

## 2012-06-05 NOTE — Progress Notes (Addendum)
[facility] PROGRESS NOTE  Nicole Harding ZOX:[id number] DOB: 04-01-1924 DOA: 05/31/2012 PCP: [name], MD  Assessment/Plan:  *Pulmonary embolism:  VQ scan was interpreted as intermediate acute pulmonary embolism, however, after reviewing with radiology, feel that PE is likely, and patient has recent history of stasis and has been on estrogen therapy.  Her lower extremity duplex was negative and she does not have evidence of right heart dysfunction on ECHO.  -  INR at goal, heparin off  Nausea/vomiting/diarrhea:  Initially improved on decreased dose of fluoxetine, then worsened.  GI was consulted.  DDx includes central etiology versus motility disorder versus infection.  Diarrhea less likely related to central process. -  Consider MRI brain -  GI consultation:  Appreciate recommendations  -  C diff test negative -  Reduce dose of fluoxetine gradually:  Day 3/7 of 20mg  dose -  Continue Zofran scheduled -  Upper GI tomorrow if feeling better  Rigors and feeling cold and mildly depressed blood pressure and relatively elevated HR.  Concern for early sepsis.   -  Blood cultures -  Urinalysis neg and urine culture pending -  CXR neg -  Vancomycin and zosyn -  NS bolus  AKI in setting of CKD (chronic kidney disease) stage 4, GFR 15-29 ml/min.  May be prerenal from recent vomiting however FEna is 1.34.  No recent contrasted studies.  Has been voiding regularly. Likely responsible for hyponatremia -  NS at 70m/l overnight -  Foley to monitor strict I/O overnight -  D/C asap.  Accelerated hypertension:  Blood pressures now low -  Continue hydralazine  -  Hold labetalol  Mechanical Fall: Patient had accidentally tripped over a carpet, she endorses balance problems and falls.  - PT evaluation:  Home health PT with 24 hour supervision.   -  Up to chair -  Up with assistance -  Fall precautions  Anemia, significant drop in all blood count on 10/26, but stable on 10/27.   Likely dilutational and hemoglobin only mildly depressed.   -  Repeat CBC if signs of bleeding.  DIET:  Vegetarian per patient request ACCESS:  PIV IVF:  OFF PROPH:  Heparin bridge to coumadin  Code Status: Full code Family Communication:  Spoke with patient and husband and grandson Disposition Plan:  Pending therapeutic INR and PT evaluation, to home with home health PT   Consultants:  GI  Procedures:  NM VQ scan 10/25  Antibiotics:  None given  HPI/Subjective:  Patient states that she continues to feel weak and has been shaking uncontrollably.  She feels nauseated and thinks she is about to have diarrhea.  Denies pain.  States that when she is shaking, she feels more Linwood Gullikson of breath, but at baseline has not been coughing and more Celina Shiley of breath.    Objective: Filed Vitals:   06/04/12 1400 06/04/12 2100 06/05/12 0500 06/05/12 1432  BP: 164/74 151/76 135/71 97/58  Pulse: 62 66 67 69  Temp: 98.1 F (36.7 C) 98.5 F (36.9 C) 97.2 F (36.2 C) 97.7 F (36.5 C)  TempSrc: Oral Oral Oral Oral  Resp: 18 18 18    Height:      Weight:      SpO2: 93% 94% 96% 94%    Intake/Output Summary (Last 24 hours) at 06/05/12 1527 Last data filed at 06/05/12 0900  Gross per 24 hour  Intake      0 ml  Output    400 ml  Net   -400 ml  Filed Weights   06/01/12 1005  Weight: 65.4 kg (144 lb 2.9 oz)    Exam:   General:  Caucasian female, lying in bed, rigoring and under 10 blankets  HEENT:  MMM  Cardiovascular:  Regular rhythm, 2/6 systolic murmur at LSB  Respiratory: CTAB  Abdomen: NABS, soft, nondistended, nontender  MSK:  No LEE  Skin: No new bruises  Data Reviewed: Basic Metabolic Panel:  Lab 06/03/12 1610 06/02/12 0605 05/31/12 2210  NA 135 138 133*  K 4.0 3.7 3.4*  CL 104 107 98  CO2 19 20 20   GLUCOSE 94 91 176*  BUN 24* 29* 34*  CREATININE 1.62* 1.60* 1.70*  CALCIUM 8.5 8.3* 9.0  MG -- -- --  PHOS -- -- --   Liver Function Tests: No results found  for this basename: AST:5,ALT:5,ALKPHOS:5,BILITOT:5,PROT:5,ALBUMIN:5 in the last 168 hours No results found for this basename: LIPASE:5,AMYLASE:5 in the last 168 hours No results found for this basename: AMMONIA:5 in the last 168 hours CBC:  Lab 06/05/12 0540 06/03/12 0521 06/02/12 0605 05/31/12 2210  WBC 7.5 9.7 8.7 12.4*  NEUTROABS -- -- -- 10.0*  HGB 11.1* 12.1 11.7* 14.3  HCT 33.5* 35.9* 34.4* 42.1  MCV 90.8 89.8 89.6 90.0  PLT 274 317 251 300   Cardiac Enzymes:  Lab 05/31/12 2209  CKTOTAL --  CKMB --  CKMBINDEX --  TROPONINI <0.30   BNP (last 3 results) No results found for this basename: PROBNP:3 in the last 8760 hours CBG:  Lab 06/05/12 1516  GLUCAP 119*    Recent Results (from the past 240 hour(s))  URINE CULTURE     Status: Normal   Collection Time   05/31/12 10:11 PM      Component Value Range Status Comment   Specimen Description URINE, RANDOM   Final    Special Requests CX ADDED AT 2237   Final    Culture  Setup Time 05/31/2012 23:02   Final    Colony Count 40,000 COLONIES/ML   Final    Culture     Final    Value: Multiple bacterial morphotypes present, none predominant. Suggest appropriate recollection if clinically indicated.   Report Status 06/01/2012 FINAL   Final   URINE CULTURE     Status: Normal   Collection Time   06/02/12  5:22 PM      Component Value Range Status Comment   Specimen Description URINE, CLEAN CATCH   Final    Special Requests NONE   Final    Culture  Setup Time 06/03/2012 02:00   Final    Colony Count 40,000 COLONIES/ML   Final    Culture     Final    Value: LACTOBACILLUS SPECIES     Note: Standardized susceptibility testing for this organism is not available.   Report Status 06/04/2012 FINAL   Final   CLOSTRIDIUM DIFFICILE BY PCR     Status: Normal   Collection Time   06/04/12  8:30 PM      Component Value Range Status Comment   C difficile by pcr NEGATIVE  NEGATIVE Final      Studies: Dg Ugi W/kub  06/05/2012   *RADIOLOGY REPORT*  Clinical Data:  Nausea, vomiting  UPPER GI SERIES WITH KUB  Technique:  Routine upper GI series was performed with thin barium  Fluoroscopy Time: 1.30 minutes  Comparison:  None.  Findings: A preliminary film of the abdomen shows a nonspecific bowel gas pattern.  There are degenerative changes in the lower  lumbar spine.  Initially rapid sequence spot films of the cervical esophagus were performed.  The swallowing mechanism is unremarkable.  There does appear to be some prominence of the cricopharyngeus muscle.  Only mild tertiary contractions are present in the mid and of the esophagus.  The stomach is normal in contour and peristalsis.  The duodenal bulb fills and the duodenal loop is in normal position.  There is spontaneous gastroesophageal reflux present.  IMPRESSION:  1.  Spontaneous gastroesophageal reflux.  No stricture. 2.  Mild tertiary contractions in the distal esophagus. 3.  The stomach and duodenum are unremarkable on this single contrast study.   Original Report Authenticated By: Juline Patch, M.D.     Scheduled Meds:    . aspirin EC  81 mg Oral Daily  . FLUoxetine  20 mg Oral Daily  . hydrALAZINE  100 mg Oral TID  . multivitamin with minerals  1 tablet Oral Daily  . ondansetron (ZOFRAN) IV  4 mg Intravenous Q8H  . pantoprazole  40 mg Oral BID AC  . saccharomyces boulardii  250 mg Oral BID  . sodium chloride  250 mL Intravenous Once  . sodium chloride  3 mL Intravenous Q12H  . warfarin  1 mg Oral ONCE-1800  . warfarin  4 mg Oral ONCE-1800  . Warfarin - Pharmacist Dosing Inpatient   Does not apply q1800  . DISCONTD: cefTRIAXone (ROCEPHIN)  IV  1 g Intravenous Q24H  . DISCONTD: labetalol  200 mg Oral BID  . DISCONTD: metoprolol succinate  25 mg Oral BID   Continuous Infusions:    . heparin 1,000 Units/hr (06/05/12 1324)    Principal Problem:  *Pulmonary embolism Active Problems:  Irritable bowel syndrome  UTI (urinary tract infection)  Pain in left  shoulder   Nausea  CKD (chronic kidney disease) stage 4, GFR 15-29 ml/min  Accelerated hypertension  Fall    Time spent: 30 minutes    Tyton Abdallah, [facility]  [facility] Pager [phone]. If 8PM-8AM, please contact night-coverage at [email or web address], password [facility] 06/05/2012, 3:27 PM  LOS: 5 days

## 2012-06-05 NOTE — Progress Notes (Signed)
   CARE MANAGEMENT NOTE 06/05/2012  Patient:  Nicole Harding, Nicole Harding   Account Number:  [id number]  Date Initiated:  06/04/2012  Documentation initiated by:  GRAVES-BIGELOW,Kayln Garceau  Subjective/Objective Assessment:   Pt admitted for treatment of PE. IV heparin gtt.     Action/Plan:   CM will continue ot monitor for disposition needs.   Anticipated DC Date:  06/06/2012   Anticipated DC Plan:  HOME W HOME HEALTH SERVICES      DC Planning Services  CM consult      Choice offered to / List presented to:          [facility] arranged  HH-2 PT      [facility] agency  [facility]   Status of service:  Completed, signed off Medicare Important Message given?   (If response is "NO", the following Medicare IM given date fields will be blank) Date Medicare IM given:   Date Additional Medicare IM given:    Discharge Disposition:  HOME W HOME HEALTH SERVICES  Per UR Regulation:  Reviewed for med. necessity/level of care/duration of stay  If discussed at Long Length of Stay Meetings, dates discussed:    Comments:  06-05-12 [address] Tom[place], [place] [phone] Pt last used [facility] and would like to use them again. Liaison to call to see which therapist pt had last time. Referral made with [place].    06-05-12 [address] [place], [place] [phone] CM did speak to husband and pt and she has had therapy in the past. Pt thought she had [facility] in the past. CM did call Gen[facility] to see if she was active with them. Awaiting call back.

## 2012-06-05 NOTE — Progress Notes (Signed)
Subjective: No change in nausea and vomiting. Diarrhea has subsided.  Objective: Vital signs in last 24 hours: Temp:  [97.2 F (36.2 C)-98.5 F (36.9 C)] 97.2 F (36.2 C) (10/29 0500) Pulse Rate:  [62-67] 67  (10/29 0500) Resp:  [18] 18  (10/29 0500) BP: (135-164)/(71-76) 135/71 mmHg (10/29 0500) SpO2:  [93 %-96 %] 96 % (10/29 0500) Weight change:  Last BM Date: 06/03/12  PE: GEN:  NAD ABD:  Soft  Studies:  (Personally reviewed), UGI series with spontaneous GERD; no ulcer, stricture, or obstruction to the level of the duodenal bulb  Stool Studies:  C diff PCR negative; full GI pathogen panel results pending  Assessment:  1.  Nausea with vomiting.  GERD seen on UGI series, unclear if this is causal or just incidental finding.  Strongly suspect neurologic (vestibular system) and functional contribution to her symptoms. 2.  Diarrhea.  Improving. 3.  Weakness, failure-to-thrive.  Plan:  1.  Trial of Protonix 40 mg po bid. 2.  Await stool studies. 3.  Continue scheduled antiemetics. 4.  If symptoms persist after PPI trial, might consider gastric emptying study. 5.  If weakness and failure-to-thrive symptoms persist, might need to consider PT/OT evaluation for outpatient disposition needs (e.g., inpatient rehab, skilled nursing, etc.).   Freddy Jaksch 06/05/2012, 12:39 PM

## 2012-06-06 ENCOUNTER — Inpatient Hospital Stay (HOSPITAL_COMMUNITY): Payer: Medicare Other

## 2012-06-06 LAB — CBC
HCT: 31.3 % — ABNORMAL LOW (ref 36.0–46.0)
MCV: 90.5 fL (ref 78.0–100.0)
RDW: 13.9 % (ref 11.5–15.5)
WBC: 11.6 10*3/uL — ABNORMAL HIGH (ref 4.0–10.5)

## 2012-06-06 LAB — URINE CULTURE: Culture: NO GROWTH

## 2012-06-06 MED ORDER — ONDANSETRON HCL 4 MG PO TABS
4.0000 mg | ORAL_TABLET | Freq: Four times a day (QID) | ORAL | Status: DC
  Administered 2012-06-06 – 2012-06-11 (×17): 4 mg via ORAL
  Filled 2012-06-06 (×23): qty 1

## 2012-06-06 MED ORDER — VANCOMYCIN HCL IN DEXTROSE 1-5 GM/200ML-% IV SOLN: 1000.0000 mg | INTRAVENOUS | Status: DC

## 2012-06-06 MED ORDER — WARFARIN SODIUM 1 MG PO TABS
1.0000 mg | ORAL_TABLET | Freq: Once | ORAL | Status: AC
  Administered 2012-06-06: 1 mg via ORAL
  Filled 2012-06-06: qty 1

## 2012-06-06 MED ORDER — PIPERACILLIN-TAZOBACTAM IN DEX 2-0.25 GM/50ML IV SOLN
2.2500 g | Freq: Four times a day (QID) | INTRAVENOUS | Status: DC
  Administered 2012-06-06 – 2012-06-07 (×4): 2.25 g via INTRAVENOUS
  Filled 2012-06-06 (×6): qty 50

## 2012-06-06 MED ORDER — METOCLOPRAMIDE HCL 5 MG PO TABS
5.0000 mg | ORAL_TABLET | Freq: Three times a day (TID) | ORAL | Status: DC
  Administered 2012-06-06 – 2012-06-08 (×7): 5 mg via ORAL
  Filled 2012-06-06 (×10): qty 1

## 2012-06-06 NOTE — Progress Notes (Signed)
ANTICOAGULATION CONSULT NOTE - Follow Up Consult  Pharmacy Consult for Heparin and Coumadin Indication: Suspected PE  Allergies  Allergen Reactions  . Amlodipine Nausea And Vomiting and Swelling    Swelling --sick and ended up in hospital N&V  . Codeine Nausea And Vomiting  . Lipitor (Atorvastatin Calcium) Other (See Comments)    "cramps my legs"  . Lisinopril Other (See Comments)    unknown  . Red Yeast Rice Other (See Comments)    unknown  . Zetia (Ezetimibe) Other (See Comments)    unknown    Patient Measurements: Height: 5\' 5"  (165.1 cm) Weight: 144 lb 2.9 oz (65.4 kg) IBW/kg (Calculated) : 57  Heparin Dosing Weight: 65.4kg  Vital Signs: Temp: 98.1 F (36.7 C) (10/30 0500) Temp src: Oral (10/30 0500) BP: 147/63 mmHg (10/30 0500) Pulse Rate: 71  (10/30 0500)  Labs:  Basename 06/06/12 0606 06/05/12 1611 06/05/12 0540 06/04/12 0535  HGB 10.5* 10.3* -- --  HCT 31.3* 30.8* 33.5* --  PLT 252 261 274 --  APTT -- -- -- --  LABPROT 27.8* -- 25.4* 20.5*  INR 2.76* -- 2.44* 1.83*  HEPARINUNFRC 0.30 -- 0.50 0.51  CREATININE -- 2.13* -- --  CKTOTAL -- -- -- --  CKMB -- -- -- --  TROPONINI -- -- -- --    Estimated Creatinine Clearance: 17.1 ml/min (by C-G formula based on Cr of 2.13).   Medications:  Heparin drip 1000 units/hr  Assessment: 88yof on Heparin bridging to Coumadin (Day 6 of overlap) for suspected PE. Heparin level (0.3) remains therapeutic - discontinue heparin drip now that INR has been therapeutic x 24hrs and overlap is complete. INR (2.76) is therapeutic and the trend up has slowed.  - H/H and Plts continue to trend down - monitor - No significant bleeding reported  Goal of Therapy:  INR 2-3   Plan:  1. Discontinue heparin drip, heparin levels 2. Repeat Coumadin 1mg  po x 1 today 3. Follow-up AM INR and CBC 4. Adjust Vancomycin and Zosyn for worsening renal function  Cleon Dew [phone] 06/06/2012,8:30 AM

## 2012-06-06 NOTE — Progress Notes (Signed)
Physical Therapy Treatment Patient Details Name: Nicole Harding MRN: [id number] DOB: 02/04/1924 Today's Date: 06/06/2012 Time: 7829-5621 PT Time Calculation (min): 26 min  PT Assessment / Plan / Recommendation Comments on Treatment Session  Pt admitted with PE s/p fall. Pt motivated and willing to participate with PT. Extreme difficulty today coming into stance and unable to attempt ambulation. Pt also with dry heaving at EOB. RN made aware. May need STSNF to maximize independence prior to d/c home.    Follow Up Recommendations  Post acute inpatient     Does the patient have the potential to tolerate intense rehabilitation  No, Recommend SNF  Barriers to Discharge        Equipment Recommendations  None recommended by PT    Recommendations for Other Services    Frequency Min 2X/week   Plan Discharge plan needs to be updated;Frequency remains appropriate    Precautions / Restrictions Precautions Precautions: Fall Restrictions Weight Bearing Restrictions: No   Pertinent Vitals/Pain None    Mobility  Bed Mobility Bed Mobility: Supine to Sit;Sit to Supine Supine to Sit: 4: Min assist;HOB flat Sit to Supine: 4: Min assist;HOB flat Details for Bed Mobility Assistance: Assist for trunk with cues for sequence. Limited by fatigue. Transfers Transfers: Sit to Stand;Stand to Sit (5 trials.) Sit to Stand: 2: Max assist;With upper extremity assist;From bed Stand to Sit: 2: Max assist;With upper extremity assist;To bed Details for Transfer Assistance: Assist for trunk to translate anterior with cues for safest hand placement and sequence. Very limited by fatigue this treatment with extreme difficulty coming to stand today. Ambulation/Gait Ambulation/Gait Assistance: Not tested (comment) (Pt unable.) Stairs: No Wheelchair Mobility Wheelchair Mobility: No    Exercises     PT Diagnosis:    PT Problem List:   PT Treatment Interventions:     PT Goals Acute Rehab PT  Goals PT Goal Formulation: With patient Time For Goal Achievement: 06/11/12 Potential to Achieve Goals: Good PT Goal: Sit to Stand - Progress: Progressing toward goal PT Goal: Ambulate - Progress: Progressing toward goal  Visit Information  Last PT Received On: 06/06/12 Assistance Needed: +2    Subjective Data  Subjective: "I'm so incredibly weak today." Patient Stated Goal: Get well.   Cognition  Overall Cognitive Status: History of cognitive impairments - at baseline Arousal/Alertness: Awake/alert Orientation Level: Appears intact for tasks assessed Behavior During Session: [facility] for tasks performed    Balance  Balance Balance Assessed: No  End of Session PT - End of Session Equipment Utilized During Treatment: Gait belt Activity Tolerance: Patient tolerated treatment well;Patient limited by fatigue Patient left: in bed;with call bell/phone within reach;with family/visitor present Nurse Communication: Mobility status   GP     Cephus Shelling 06/06/2012, 1:35 PM  06/06/2012 Cephus Shelling, PT, DPT [phone]

## 2012-06-06 NOTE — Progress Notes (Signed)
Subjective: "I don't feel good."  Objective: Vital signs in last 24 hours: Temp:  [97.7 F (36.5 C)-98.9 F (37.2 C)] 98.1 F (36.7 C) (10/30 0500) Pulse Rate:  [66-71] 71  (10/30 0500) Resp:  [18-20] 18  (10/30 0500) BP: (97-153)/(58-72) 147/63 mmHg (10/30 0500) SpO2:  [93 %-96 %] 93 % (10/30 0500) Weight change:  Last BM Date: 06/04/12  PE: GEN:  NAD ABD:  Soft  Assessment:  1.  Chronic nausea +/- vomiting.  Reflux seen on UGI series, started PPI yesterday. 2.  Diarrhea.  Stool studies negative.  Symptoms seem to have subsided.  Plan:  1.  Continue Protonix 40 mg bid. 2.  Continue antiemetics. 3.  I suspect many of her symptoms are anxiety/functional in nature, and are longstanding in duration.  I doubt utility of any further GI tract evaluation or testing as inpatient. 4.  Will revisit in a couple days, if she is not discharged in the interim.  In any case, we are happy to arrange outpatient evaluation with Dr. Evette Cristal, her primary gastroenterologist.   Freddy Jaksch 06/06/2012, 9:57 AM

## 2012-06-06 NOTE — Progress Notes (Signed)
[facility] PROGRESS NOTE  Nicole Harding WUJ:[id number] DOB: [birth year] DOA: 05/31/2012 PCP: [name], MD  Assessment/Plan:  *Pulmonary embolism:  VQ scan was interpreted as intermediate acute pulmonary embolism, however, after reviewing with radiology, feel that PE is likely, and patient has recent history of stasis and has been on estrogen therapy.  Her lower extremity duplex was negative and she does not have evidence of right heart dysfunction on ECHO.  -  INR at goal, heparin discontinued on 10/25  Nausea/vomiting/diarrhea:  Initially improved on decreased dose of fluoxetine, then worsened.  GI was consulted.  DDx includes central etiology versus motility disorder versus infection.  Diarrhea less likely related to central process. -  GI consultation:  Appreciate recommendations  -  C diff test negative -  Reduce dose of fluoxetine gradually:  Day 3/7 of 20mg  dose -  Zofran was discontinued and will be reimplemented 06/06/2012 -  Upper GI series showed severe GERD-risks versus benefits of promotility agents will be discussed-I. am hearing on that side of starting Reglan 3 times a day by mouth 10/30  Rigors and feeling cold and mildly depressed blood pressure and relatively elevated HR.  Concern for early sepsis.   -  Blood cultures and urine culture pending -  CXR neg -  Vancomycin and zosyn started on 10/29 [broadening from Rocephin started 10/26]-Will D. escalate therapy as possible-see below -  NS bolus  AKI in setting of CKD (chronic kidney disease) stage 4, GFR 15-29 ml/min+ hyponatremia.  May be prerenal from recent vomiting however FEna is 1.34.  No recent contrasted studies.  Has been voiding regularly. Likely responsible for hyponatremia -  NS at 27m/l  -  Foley to monitor strict I/O overnight -  D/C asap-voiding trial done 10/30 -Repeat labs in the morning-his labs seem to worsen, would get nephrology involved. Would try to minimize use of vancomycin  [discontinue 06/06/12]  Accelerated hypertension:  -  Continue hydralazine 100 mg 3 times a day -  Hold labetalol [expect rebound tachycardia]  Mechanical Fall: Patient had accidentally tripped over a carpet, she endorses balance problems and falls.  - PT evaluation:  Home health PT with 24 hour supervision.   -  Up to chair -  Up with assistance -  Fall precautions  Anemia, significant drop in all blood count on 10/26, but stable on 10/27.  Likely dilutational and hemoglobin only mildly depressed.   -  Repeat CBC if signs of bleeding.  Left ankle pain ? Gout versus fracture-get x-ray and reassess   DIET:  Vegetarian per patient request ACCESS:  PIV IVF:  OFF PROPH:  Therapeutic on Coumadin  Code Status: Full code Family Communication:  Spoke with patient and husband patient's daughter who is an Charity fundraiser Disposition Plan:  Pending therapeutic INR and PT evaluation, to home with home health PT  Chart review  Seen by Dr. Patty Sermons in the outpatient setting 01/2012-noted per that history of irritable bowel syndrome and history of chronic areolar deficiency-she had a normal stress test in 2003 by Dr. Shirlee Latch and a remote echocardiogram was unremarkable  Had been on Premarin 0.625 mg daily which was decreased in the outpatient setting to 0.3 mg, given risk of breast cancer?-was counseled regarding this per Dr. Yevonne Pax last note  Multiple episodes of presyncopal symptoms and orthostatic hypotension per Dr. Yevonne Pax notes  ? 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 Annitta Jersey in 2000   Consultants:  GI  Procedures:  NM VQ scan 10/25  Upper GI series done 10/29 = spontaneous gastric stage her reflux with no stricture, mild tertiary contractions distal  esophagus, stomach duodenum unremarkable   Antibiotics:  None given  HPI/Subjective:  Patient states that she continues to feel weak, one episode of emesis morning. States that her left ankle is very painful and swollen. No nausea at present however in no diarrhea either no chest pain no shortness of breath no blurred or double vision By mouth intake has improved but is still minimal  Objective: Filed Vitals:   06/05/12 1630 06/05/12 2100 06/06/12 0500 06/06/12 1118  BP: 135/72 153/71 147/63 138/70  Pulse: 66 69 71   Temp:  98.9 F (37.2 C) 98.1 F (36.7 C)   TempSrc:  Oral Oral   Resp:  20 18   Height:      Weight:      SpO2:  96% 93%     Intake/Output Summary (Last 24 hours) at 06/06/12 1232 Last data filed at 06/06/12 0900  Gross per 24 hour  Intake    340 ml  Output   1000 ml  Net   -660 ml   Filed Weights   06/01/12 1005  Weight: 65.4 kg (144 lb 2.9 oz)    Exam:   General:  Caucasian female, lying in bed  HEENT:  MMM  Cardiovascular:  Regular rhythm, 2/6 systolic murmur at LSB  Respiratory: CTAB  Abdomen: NABS, soft, nondistended, nontender  MSK:  No LEE  Skin: No new bruises  Data Reviewed: Basic Metabolic Panel:  Lab 06/05/12 1610 06/03/12 0521 06/02/12 0605 05/31/12 2210  NA 131* 135 138 133*  K 3.5 4.0 3.7 3.4*  CL 99 104 107 98  CO2 19 19 20 20   GLUCOSE 114* 94 91 176*  BUN 23 24* 29* 34*  CREATININE 2.13* 1.62* 1.60* 1.70*  CALCIUM 8.2* 8.5 8.3* 9.0  MG -- -- -- --  PHOS -- -- -- --   Liver Function Tests:  Lab 06/05/12 1611  AST 27  ALT 15  ALKPHOS 53  BILITOT 0.1*  PROT 5.6*  ALBUMIN 2.8*   No results found for this basename: LIPASE:5,AMYLASE:5 in the last 168 hours No results found for this basename: AMMONIA:5 in the last 168 hours CBC:  Lab 06/06/12 0606 06/05/12 1611 06/05/12 0540 06/03/12 0521 06/02/12 0605 05/31/12 2210  WBC 11.6* 7.5 7.5 9.7 8.7 --  NEUTROABS -- 5.7 -- -- -- 10.0*  HGB 10.5* 10.3* 11.1* 12.1  11.7* --  HCT 31.3* 30.8* 33.5* 35.9* 34.4* --  MCV 90.5 90.1 90.8 89.8 89.6 --  PLT 252 261 274 317 251 --   Cardiac Enzymes:  Lab 05/31/12 2209  CKTOTAL --  CKMB --  CKMBINDEX --  TROPONINI <0.30   BNP (last 3 results) No results found for this basename: PROBNP:3 in the last 8760 hours CBG:  Lab 06/05/12 1516  GLUCAP 119*    Recent Results (from the past 240 hour(s))  URINE CULTURE     Status: Normal   Collection Time   05/31/12 10:11 PM      Component Value Range Status Comment   Specimen Description URINE, RANDOM   Final    Special Requests CX ADDED AT 2237   Final    Culture  Setup Time 05/31/2012 23:02   Final  Colony Count 40,000 COLONIES/ML   Final    Culture     Final    Value: Multiple bacterial morphotypes present, none predominant. Suggest appropriate recollection if clinically indicated.   Report Status 06/01/2012 FINAL   Final   URINE CULTURE     Status: Normal   Collection Time   06/02/12  5:22 PM      Component Value Range Status Comment   Specimen Description URINE, CLEAN CATCH   Final    Special Requests NONE   Final    Culture  Setup Time 06/03/2012 02:00   Final    Colony Count 40,000 COLONIES/ML   Final    Culture     Final    Value: LACTOBACILLUS SPECIES     Note: Standardized susceptibility testing for this organism is not available.   Report Status 06/04/2012 FINAL   Final   CLOSTRIDIUM DIFFICILE BY PCR     Status: Normal   Collection Time   06/04/12  8:30 PM      Component Value Range Status Comment   C difficile by pcr NEGATIVE  NEGATIVE Final   CULTURE, BLOOD (ROUTINE X 2)     Status: Normal (Preliminary result)   Collection Time   06/05/12  4:00 PM      Component Value Range Status Comment   Specimen Description BLOOD RIGHT HAND   Final    Special Requests BOTTLES DRAWN AEROBIC ONLY 5CC   Final    Culture  Setup Time 06/05/2012 23:05   Final    Culture     Final    Value:        BLOOD CULTURE RECEIVED NO GROWTH TO DATE CULTURE  WILL BE HELD FOR 5 DAYS BEFORE ISSUING A FINAL NEGATIVE REPORT   Report Status PENDING   Incomplete   CULTURE, BLOOD (ROUTINE X 2)     Status: Normal (Preliminary result)   Collection Time   06/05/12  4:06 PM      Component Value Range Status Comment   Specimen Description BLOOD LEFT HAND   Final    Special Requests BOTTLES DRAWN AEROBIC ONLY 10CC   Final    Culture  Setup Time 06/05/2012 23:05   Final    Culture     Final    Value:        BLOOD CULTURE RECEIVED NO GROWTH TO DATE CULTURE WILL BE HELD FOR 5 DAYS BEFORE ISSUING A FINAL NEGATIVE REPORT   Report Status PENDING   Incomplete      Studies: US Renal  06/05/2012  *RADIOLOGY REPORT*  Clinical Data: Acute renal insufficiency  RENAL/URINARY TRACT ULTRASOUND COMPLETE  Comparison:  05/19/2006  Findings:  Right Kidney:  8.2 cm in length.  No hydronephrosis or focal lesion.  Isoechoic or slightly echogenic compared to the adjacent liver.  Left Kidney:  10.2 cm in length.  No focal lesion or hydronephrosis.  Bladder:  Decompressed by Foley catheter  IMPRESSION:  1.  No hydronephrosis. 2.  Echogenic renal parenchyma, a nonspecific indicator of medical renal disease. 3.  Asymmetric kidney sizes, left greater than right, suggesting right renal artery stenosis or previous parenchymal insult.   Original Report Authenticated By: Osa Craver, M.D.    Dg Chest Port 1 View  06/05/2012  *RADIOLOGY REPORT*  Clinical Data: Hypotension, rigors,  PORTABLE CHEST - 1 VIEW  Comparison: 05/31/2012.  Findings: Lung volumes are low on this examination.  The lungs are clear.  No consolidation or edema.  There  are no effusions or pneumothoraces.  The heart, mediastinal and hilar contours are normal with note made again of prominent central pulmonary arteries.  There are no acute bony abnormalities.  IMPRESSION: No active disease.   Original Report Authenticated By: Mervin Hack, M.D.    Varney Biles Kayleen Memos Hans [place]  06/05/2012  *RADIOLOGY REPORT*  Clinical Data:   Nausea, vomiting  UPPER GI SERIES WITH KUB  Technique:  Routine upper GI series was performed with thin barium  Fluoroscopy Time: 1.30 minutes  Comparison:  None.  Findings: A preliminary film of the abdomen shows a nonspecific bowel gas pattern.  There are degenerative changes in the lower lumbar spine.  Initially rapid sequence spot films of the cervical esophagus were performed.  The swallowing mechanism is unremarkable.  There does appear to be some prominence of the cricopharyngeus muscle.  Only mild tertiary contractions are present in the mid and of the esophagus.  The stomach is normal in contour and peristalsis.  The duodenal bulb fills and the duodenal loop is in normal position.  There is spontaneous gastroesophageal reflux present.  IMPRESSION:  1.  Spontaneous gastroesophageal reflux.  No stricture. 2.  Mild tertiary contractions in the distal esophagus. 3.  The stomach and duodenum are unremarkable on this single contrast study.   Original Report Authenticated By: Juline Patch, M.D.     Scheduled Meds:    . aspirin EC  81 mg Oral Daily  . FLUoxetine  20 mg Oral Daily  . hydrALAZINE  100 mg Oral TID  . multivitamin with minerals  1 tablet Oral Daily  . ondansetron (ZOFRAN) IV  4 mg Intravenous Q8H  . pantoprazole  40 mg Oral BID AC  . piperacillin-tazobactam (ZOSYN)  IV  2.25 g Intravenous Q6H  . saccharomyces boulardii  250 mg Oral BID  . sodium chloride  250 mL Intravenous Once  . sodium chloride  3 mL Intravenous Q12H  . vancomycin  1,000 mg Intravenous Q48H  . warfarin  1 mg Oral ONCE-1800  . warfarin  1 mg Oral ONCE-1800  . Warfarin - Pharmacist Dosing Inpatient   Does not apply q1800  . DISCONTD: cefTRIAXone (ROCEPHIN)  IV  1 g Intravenous Q24H  . DISCONTD: labetalol  200 mg Oral BID  . DISCONTD: piperacillin-tazobactam (ZOSYN)  IV  3.375 g Intravenous Q8H  . DISCONTD: vancomycin  750 mg Intravenous Q24H   Continuous Infusions:    . sodium chloride 75 mL/hr at  06/05/12 2003  . DISCONTD: heparin 1,000 Units/hr (06/05/12 1324)    Principal Problem:  *Pulmonary embolism Active Problems:  Irritable bowel syndrome  UTI (urinary tract infection)  Pain in left shoulder   Nausea  CKD (chronic kidney disease) stage 4, GFR 15-29 ml/min  Accelerated hypertension  Fall  Rigors    Time spent: 30 minutes    Mahala Menghini [facility]  [facility] Pager [phone]. If 8PM-8AM, please contact night-coverage at [email or web address], password [facility] 06/06/2012, 12:32 PM  LOS: 6 days

## 2012-06-07 DIAGNOSIS — G959 Disease of spinal cord, unspecified: Secondary | ICD-10-CM | POA: Diagnosis present

## 2012-06-07 LAB — CBC
HCT: 31.2 % — ABNORMAL LOW (ref 36.0–46.0)
MCH: 29.9 pg (ref 26.0–34.0)
MCV: 90.7 fL (ref 78.0–100.0)
RDW: 14.1 % (ref 11.5–15.5)
WBC: 10.5 10*3/uL (ref 4.0–10.5)

## 2012-06-07 LAB — COMPREHENSIVE METABOLIC PANEL
Albumin: 2.8 g/dL — ABNORMAL LOW (ref 3.5–5.2)
BUN: 20 mg/dL (ref 6–23)
CO2: 18 mEq/L — ABNORMAL LOW (ref 19–32)
Calcium: 8.6 mg/dL (ref 8.4–10.5)
Chloride: 102 mEq/L (ref 96–112)
Creatinine, Ser: 2.21 mg/dL — ABNORMAL HIGH (ref 0.50–1.10)
GFR calc non Af Amer: 19 mL/min — ABNORMAL LOW (ref >90)
Total Bilirubin: 0.4 mg/dL (ref 0.3–1.2)

## 2012-06-07 NOTE — Progress Notes (Signed)
Nicole Harding, is experiencing some abnormal  movements  of LE (intermittent shaking), paged physician. Pt is alert, stable and has no other symptoms.  Will continue to monitor.

## 2012-06-07 NOTE — Consult Note (Signed)
NEURO HOSPITALIST CONSULT NOTE    Reason for Consult: In 100 movement abnormality; rule out seizure activity.  HPI:                                                                                                                                          Nicole Harding is an 78 y.o. female with a history of hypertension, hyperlipidemia, irritable bowel syndrome, possible Parkinson's disease, and arthritis who was admitted on 05/31/2012 for management of probable pulmonary embolus. Patient was started on anticoagulation therapy which was continued with warfarin. Neurology consultation was obtained because of tremulous movements affecting extremities which were noticed on 2 occasions as the patient was waking up from sleep. Patient indicates that tremors of a sore also occur when she is awake with no associated alteration in mental status. She has no control over the movements which typically lasts 10-15 seconds. Her legs are affected primarily. She has fallen on several occasions, the most recent of which was on the day of admission. She also indicates she has urinary and fecal urge incontinence.  Past Medical History  Diagnosis Date  . Hypertension   . History of orthostatic hypotension   . IBS (irritable bowel syndrome)   . Parkinson's disease     ATYPICAL  . Dizziness   . Vertigo   . Fatigue   . Advanced age   . HLD (hyperlipidemia)     intolerant to lipid lowering drugs  . Normal nuclear stress test 2003  . Fall at home 05/2012    mechanical fall  (06/01/2012)  . Pulmonary embolism 05/31/2012  . Chronic kidney disease, stage 4 (severe)   . Exertional dyspnea   . Arthritis     "legs, fingers" (06/01/2012)    Past Surgical History  Procedure Date  . Small intestine surgery   . Hemorrhoid surgery 1970    "have4 had accidents ever since" (06/01/2012)  . Abdominal hysterectomy 1970's  . Tonsillectomy 1954  . Cholecystectomy 1955?  Marland Kitchen Breast cyst excision  1970's    left  . Cataract extraction w/ intraocular lens  implant, bilateral ? 1990's  . Shoulder open rotator cuff repair 1990?    right    Family History  Problem Relation Age of Onset  . Stroke Father     Family History: Noncontributory  Social History:  reports that she quit smoking about 41 years ago. Her smoking use included Cigarettes. She has a 20 pack-year smoking history. She has never used smokeless tobacco. She reports that she does not drink alcohol or use illicit drugs.  Allergies  Allergen Reactions  . Amlodipine Nausea And Vomiting and Swelling    Swelling --sick and ended up in hospital N&V  . Codeine Nausea And Vomiting  . Lipitor (Atorvastatin Calcium) Other (  See Comments)    "cramps my legs"  . Lisinopril Other (See Comments)    unknown  . Red Yeast Rice Other (See Comments)    unknown  . Zetia (Ezetimibe) Other (See Comments)    unknown    MEDICATIONS:                                                                                                                     Scheduled:   . aspirin EC  81 mg Oral Daily  . FLUoxetine  20 mg Oral Daily  . hydrALAZINE  100 mg Oral TID  . metoCLOPramide  5 mg Oral TID AC  . multivitamin with minerals  1 tablet Oral Daily  . ondansetron  4 mg Oral Q6H  . pantoprazole  40 mg Oral BID AC  . saccharomyces boulardii  250 mg Oral BID  . sodium chloride  3 mL Intravenous Q12H  . Warfarin - Pharmacist Dosing Inpatient   Does not apply q1800  . DISCONTD: piperacillin-tazobactam (ZOSYN)  IV  2.25 g Intravenous Q6H   VHQ:[id number], acetaminophen, alum & mag hydroxide-simeth, HYDROcodone-acetaminophen, HYDROmorphone (DILAUDID) injection   Blood pressure 110/62, pulse 81, temperature 98.4 F (36.9 C), temperature source Oral, resp. rate 18, height 5\' 5"  (1.651 m), weight 65.4 kg (144 lb 2.9 oz), SpO2 92.00%.  Neurologic Examination:                                                                                                       Mental Status: Alert, oriented, thought content appropriate.  Speech fluent without evidence of aphasia. Able to follow commands without difficulty. Cranial Nerves: II-Visual fields were normal. III/IV/VI-Pupils were equal and reacted. Extraocular movements were full and conjugate.    V/VII-no facial numbness and no facial weakness. VIII-slightly reduced hearing acuity. X-normal speech and symmetrical palatal movement. Motor: 5/5 bilaterally with normal tone and bulk; muscle tone was flaccid throughout. Patient developed rhythmic clonus -like spasms of both lower extremities on multiple occasions during examination of lower extremities, left greater than right,  lasting about 10 seconds in each instance. No spasm or clonus of upper extremities occurred. Sensory: Normal throughout. Deep Tendon Reflexes: diffusely hyperreflexic which was symmetric; minimal frontal release signs.  Plantars:  extensor bilaterally.  Cerebellar: Normal finger-to-nose testing septal minimal intention tremor bilaterally. Carotid auscultation: Normal   Assessment/Plan: 1. Diffuse hyperreflexia with intermittent clonic spasms of lower extremities. Etiology is unclear. With diffuse hyperreflexia and urge incontinence, cervical myelopathy is suspected. 2. Seizure disorder is less likely, as patient has clonic movements of extremities during wakefulness with no  changes in mental status. 3. No clinical signs of Parkinson's disease.  Recommendations: 1. Cervical and thoracic spine MRI without contrast. 2. Vitamin B12 and folate levels. 3. Physical therapy consultation for gait evaluation and recommendations.   Venetia Maxon M.D. [facility] [phone]  06/07/2012, 7:55 PM

## 2012-06-07 NOTE — Progress Notes (Signed)
Subjective: Nausea much improved.  Objective: Vital signs in last 24 hours: Temp:  [98.3 F (36.8 C)-98.8 F (37.1 C)] 98.5 F (36.9 C) (10/31 0435) Pulse Rate:  [94-99] 94  (10/31 0435) Resp:  [17-18] 17  (10/31 0435) BP: (110-170)/(62-80) 110/80 mmHg (10/31 1014) SpO2:  [92 %-93 %] 92 % (10/31 0435) Weight change:  Last BM Date: 06/06/12  PE: GEN:  NAD, much more upbeat-appearing  Assessment:  1.  Nausea with intermittent vomiting.  Improved.  Likely multifactorial (anxiety, GERD, medication (fluoxetine) related). 2.  Diarrhea.  Resolved.  Stool studies negative.  Plan:  1.  Stay on probiotics as long as patient is taking antibiotics. 2.  Protonix 40 mg bid x 4 weeks, then transition to 40 mg once-a-day thereafter. 3.  No further recommendations at this time; will sign-off and arrange outpatient follow-up with Dr. Evette Cristal, patient's primary gastroenterologist.  Thank you for the consult.   Freddy Jaksch 06/07/2012, 10:43 AM

## 2012-06-07 NOTE — Clinical Social Work Psychosocial (Signed)
     Clinical Social Work Department BRIEF PSYCHOSOCIAL ASSESSMENT 06/07/2012  Patient:  Nicole Harding, Nicole Harding     Account Number:  [id number]     Admit date:  05/31/2012  Clinical Social Worker:  Margaree Mackintosh  Date/Time:  06/07/2012 12:00 M  Referred by:  Care Management  Date Referred:  06/07/2012 Referred for  SNF Placement   Other Referral:   Interview type:  Patient Other interview type:   with family present.    PSYCHOSOCIAL DATA Living Status:  FAMILY Admitted from facility:   Level of care:   Primary support name:  Farris Has: [phone] Primary support relationship to patient:  SPOUSE Degree of support available:   Adequate.    CURRENT CONCERNS Current Concerns  Post-Acute Placement   Other Concerns:    SOCIAL WORK ASSESSMENT / PLAN Clinical Social Worker recieved referral indicating PT now recommending SNF at dc.  CSW reviewed chart and met with pt and family at bedside.  CSW introduced self, explained role, and provided support.  CSW reviewed PT recommendations; pt and family would like to dc home wiht home health.  CSW reviewed SNF process; pt and family are not interested in this option at this time.  CSW updated RNCM.  CSW to sign off, please re consult if needed.   Assessment/plan status:  Information/Referral to [facility] Other assessment/ plan:   Information/referral to community resources:   HH.    PATIENTS/FAMILYS RESPONSE TO PLAN OF CARE: Pt and family were pleasant and engaged in conversation. Pt and family thanked CSW for intervention.

## 2012-06-07 NOTE — Progress Notes (Signed)
  ANTICOAGULATION CONSULT NOTE - Follow Up Consult  Pharmacy Consult for Coumadin Indication: Suspected PE  Allergies  Allergen Reactions  . Amlodipine Nausea And Vomiting and Swelling    Swelling --sick and ended up in hospital N&V  . Codeine Nausea And Vomiting  . Lipitor (Atorvastatin Calcium) Other (See Comments)    "cramps my legs"  . Lisinopril Other (See Comments)    unknown  . Red Yeast Rice Other (See Comments)    unknown  . Zetia (Ezetimibe) Other (See Comments)    unknown    Patient Measurements: Height: 5\' 5"  (165.1 cm) Weight: 144 lb 2.9 oz (65.4 kg) IBW/kg (Calculated) : 57  Heparin Dosing Weight:   Vital Signs: Temp: 98.5 F (36.9 C) (10/31 0435) Temp src: Oral (10/31 0435) BP: 110/80 mmHg (10/31 1014) Pulse Rate: 94  (10/31 0435)  Labs:  Basename 06/07/12 0625 06/06/12 0606 06/05/12 1611 06/05/12 0540  HGB 10.3* 10.5* -- --  HCT 31.2* 31.3* 30.8* --  PLT 245 252 261 --  APTT -- -- -- --  LABPROT 33.6* 27.8* -- 25.4*  INR 3.57* 2.76* -- 2.44*  HEPARINUNFRC -- 0.30 -- 0.50  CREATININE 2.21* -- 2.13* --  CKTOTAL -- -- -- --  CKMB -- -- -- --  TROPONINI -- -- -- --    Estimated Creatinine Clearance: 16.4 ml/min (by C-G formula based on Cr of 2.21).  Assessment: 88yof on Coumadin for suspected PE. Heparin bridge was completed 10/30. INR (3.57) is supratherapeutic after significantly increasing. INR trend up had slowed with Coumadin 1mg  but unexpectely jumped overnight. AST/ALT have trended up - monitor. Possible drug interactions - antibiotics (Vancomycin and Zosyn now discontinued), Prozac.  - H/H trending down, Plts stable - No significant bleeding reported  Goal of Therapy:  INR 2-3   Plan:  1. No Coumadin today 2. Follow-up AM INR 3. Monitor LFTs and s/sx of bleeding  Cleon Dew [phone] 06/07/2012,11:23 AM

## 2012-06-07 NOTE — Progress Notes (Signed)
[facility] PROGRESS NOTE  Nicole Harding WUJ:[id number] DOB: 1924/07/18 DOA: 05/31/2012 PCP: [name], MD  Assessment/Plan:  *Pulmonary embolism:  VQ scan was interpreted as intermediate acute pulmonary embolism, however, after reviewing with radiology, feel that PE is likely, and patient has recent history of stasis and has been on estrogen therapy.  Her lower extremity duplex was negative and she does not have evidence of right heart dysfunction on ECHO.  -  INR at goal, heparin discontinued on 10/25  Seizure? DDx benign sleep myoclonus, Tremors-Has been told in the past might have had Atypical Parkinson's per [facility] She has a witnessed episode of shaking by me at bedside when i woke her from a nap-this doesn;t seem to be dissimilar from her presentation to Dr. Malachi Bonds on 10/29 .  I have consulted Dr. Amada Jupiter of Neurology who has graciously agreed to see her She potentially very well might have parkinson's and may benefit from a repeat trial of Sinemet?  Nausea/vomiting/diarrhea:  Initially improved on decreased dose of fluoxetine, then worsened.  GI was consulted.  DDx includes central etiology versus motility disorder versus infection.  Diarrhea less likely related to central process. -  GI consultation:  Appreciate recommendations-signed off 10/31 -  C diff test negative -  Reduce dose of fluoxetine gradually:  Day 5/7 of 20mg  dose-to discontinue 06/10/12 -  Zofran was discontinued and was reimplemented 06/06/2012 -  Upper GI series showed severe GERD-risks versus benefits of promotility agents will be discussed-I am hearing on that side of starting Reglan 3 times a day by mouth 10/30  Rigors and feeling cold and mildly depressed blood pressure and relatively elevated HR.  Concern for early sepsis.   -  Blood cultures and urine culture negative 10/29 -  CXR neg -  Vancomycin and zosyn started on 10/29 [broadening from Rocephin started 10/26]-Will  De-escalate to Doxycyclin 100 bid 10/31  AKI in setting of CKD (chronic kidney disease) stage 4, GFR 15-29 ml/min+ hyponatremia, With mild Metabolic Acidosis.  May be prerenal from recent vomiting however FEna is 1.34.  No recent contrasted studies.  Has been voiding regularly. Likely responsible for hyponatremia -  Was on Ns 75-d/c'd -  Foley to monitor strict I/O overnight -  D/C asap-voiding trial done 10/30 -Repeat labs in the morning-his labs seem to have stabilized,  minimize use of vancomycin [discontinued 06/06/12]  Accelerated hypertension:  -  Continue hydralazine 100 mg 3 times a day -  Hold labetalol [expect rebound tachycardia]  Mechanical Fall: Patient had accidentally tripped over a carpet, she endorses balance problems and falls.  - PT evaluation:  Home health PT with 24 hour supervision.   -  Up to chair -  Up with assistance -  Fall precautions  Anemia, significant drop in all blood count on 10/26, but stable on 10/27.  Likely dilutational and hemoglobin only mildly depressed.   -  Repeat CBC if signs of bleeding.  Left ankle pain ? Gout versus fracture-get x-ray and reassess-no pain today.  Reassure-likely was gout   DIET:  Vegetarian per patient request ACCESS:  PIV IVF:  OFF PROPH:  Therapeutic on Coumadin  Code Status: Full code Family Communication:  Spoke with patient and husband patient's daughter who is an Char[employer] fundraiser Disposition Plan:  Pending therapeutic INR and PT evaluation, to home with home health PT  Chart review  Seen by Dr. Patty Sermons in the outpatient setting 01/2012-noted per that history of irritable bowel syndrome and history of chronic areolar deficiency-she  had a normal stress test in 2003 by Dr. Shirlee Latch and a remote echocardiogram was unremarkable  Had been on Premarin 0.625 mg daily which was decreased in the outpatient setting to 0.3 mg, given risk of breast cancer?-was counseled regarding this per Dr. Yevonne Pax last note  Multiple episodes of  presyncopal symptoms and orthostatic hypotension per Dr. Yevonne Pax notes  ? 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 Annitta Jersey in 2000   Consultants:  GI  Procedures:  NM VQ scan 10/25  Upper GI series done 10/29 = spontaneous gastric stage her reflux with no stricture, mild tertiary contractions distal esophagus, stomach duodenum unremarkable   Antibiotics:  None given  HPI/Subjective:  Patient states that she continues to feel weak, one episode of emesis morning. No nausea at present however in no diarrhea either no chest pain no shortness of breath no blurred or double vision By mouth intake has improved and seems to be improving Hasn't been oob or off oxygen since admit  Objective: Filed Vitals:   06/07/12 0435 06/07/12 1014 06/07/12 1400 06/07/12 1524  BP: 156/73 110/80 150/77 110/62  Pulse: 94  81   Temp: 98.5 F (36.9 C)  98.4 F (36.9 C)   TempSrc: Oral     Resp: 17  18   Height:      Weight:      SpO2: 92%  90%     Intake/Output Summary (Last 24 hours) at 06/07/12 1625 Last data filed at 06/07/12 1200  Gross per 24 hour  Intake    600 ml  Output   1501 ml  Net   -901 ml   Filed Weights   06/01/12 1005  Weight: 65.4 kg (144 lb 2.9 oz)    Exam:   General:  Caucasian female, lying in bed  HEENT:  MMM  Cardiovascular:  Regular rhythm, 2/6 systolic murmur at LSB  Respiratory: CTAB  Abdomen: NABS, soft, nondistended, nontender  MSK:  No LEE  Skin: No new bruises  Data Reviewed: Basic Metabolic Panel:  Lab 06/07/12 3244 06/05/12 1611 06/03/12 0521 06/02/12 0605 05/31/12 2210  NA 133* 131* 135 138 133*  K 3.9 3.5 4.0 3.7 3.4*  CL 102 99 104 107 98  CO2 18* 19 19 20 20   GLUCOSE 119* 114* 94 91 176*  BUN 20 23 24* 29*  34*  CREATININE 2.21* 2.13* 1.62* 1.60* 1.70*  CALCIUM 8.6 8.2* 8.5 8.3* 9.0  MG -- -- -- -- --  PHOS -- -- -- -- --   Liver Function Tests:  Lab 06/07/12 0625 06/05/12 1611  AST 43* 27  ALT 31 15  ALKPHOS 53 53  BILITOT 0.4 0.1*  PROT 5.8* 5.6*  ALBUMIN 2.8* 2.8*   No results found for this basename: LIPASE:5,AMYLASE:5 in the last 168 hours No results found for this basename: AMMONIA:5 in the last 168 hours CBC:  Lab 06/07/12 0625 06/06/12 0606 06/05/12 1611 06/05/12 0540 06/03/12 0521 05/31/12 2210  WBC 10.5 11.6* 7.5 7.5 9.7 --  NEUTROABS -- -- 5.7 -- -- 10.0*  HGB 10.3* 10.5* 10.3* 11.1* 12.1 --  HCT 31.2* 31.3* 30.8* 33.5* 35.9* --  MCV 90.7 90.5 90.1 90.8 89.8 --  PLT 245 252 261 274 317 --   Cardiac Enzymes:  Lab 05/31/12 2209  CKTOTAL --  CKMB --  CKMBINDEX --  TROPONINI <0.30   BNP (last 3 results) No results found for this basename: PROBNP:3 in the last 8760 hours CBG:  Lab 06/05/12 1516  GLUCAP 119*    Recent Results (from the past 240 hour(s))  URINE CULTURE     Status: Normal   Collection Time   05/31/12 10:11 PM      Component Value Range Status Comment   Specimen Description URINE, RANDOM   Final    Special Requests CX ADDED AT 2237   Final    Culture  Setup Time 05/31/2012 23:02   Final    Colony Count 40,000 COLONIES/ML   Final    Culture     Final    Value: Multiple bacterial morphotypes present, none predominant. Suggest appropriate recollection if clinically indicated.   Report Status 06/01/2012 FINAL   Final   URINE CULTURE     Status: Normal   Collection Time   06/02/12  5:22 PM      Component Value Range Status Comment   Specimen Description URINE, CLEAN CATCH   Final    Special Requests NONE   Final    Culture  Setup Time 06/03/2012 02:00   Final    Colony Count 40,000 COLONIES/ML   Final    Culture     Final    Value: LACTOBACILLUS SPECIES     Note: Standardized susceptibility testing for this organism is not available.    Report Status 06/04/2012 FINAL   Final   CLOSTRIDIUM DIFFICILE BY PCR     Status: Normal   Collection Time   06/04/12  8:30 PM      Component Value Range Status Comment   C difficile by pcr NEGATIVE  NEGATIVE Final   CULTURE, BLOOD (ROUTINE X 2)     Status: Normal (Preliminary result)   Collection Time   06/05/12  4:00 PM      Component Value Range Status Comment   Specimen Description BLOOD RIGHT HAND   Final    Special Requests BOTTLES DRAWN AEROBIC ONLY 5CC   Final    Culture  Setup Time 06/05/2012 23:05   Final    Culture     Final    Value:        BLOOD CULTURE RECEIVED NO GROWTH TO DATE CULTURE WILL BE HELD FOR 5 DAYS BEFORE ISSUING A FINAL NEGATIVE REPORT   Report Status PENDING   Incomplete   CULTURE, BLOOD (ROUTINE X 2)     Status: Normal (Preliminary result)   Collection Time   06/05/12  4:06 PM      Component Value Range Status Comment   Specimen Description BLOOD LEFT HAND   Final    Special Requests BOTTLES DRAWN AEROBIC ONLY 10CC   Final    Culture  Setup Time 06/05/2012 23:05   Final    Culture     Final    Value:        BLOOD CULTURE RECEIVED NO GROWTH TO DATE CULTURE WILL BE HELD FOR 5 DAYS BEFORE ISSUING A FINAL NEGATIVE REPORT   Report Status PENDING   Incomplete   URINE CULTURE     Status: Normal   Collection Time   06/05/12  5:00 PM      Component Value Range Status Comment   Specimen Description URINE, CATHETERIZED   Final    Special Requests NONE   Final    Culture  Setup Time 06/05/2012 17:57   Final    Colony Count NO  GROWTH   Final    Culture NO GROWTH   Final    Report Status June 19, 2012 FINAL   Final      Studies: Dg Ankle Complete Left  2012-06-19  *RADIOLOGY REPORT*  Clinical Data: Lateral ankle swelling  LEFT ANKLE COMPLETE - 3+ VIEW  Comparison: None.  Findings: Bones appear osteopenic.  Peripheral vascular calcifications noted.  Normal alignment without fracture.  No effusion noted.  Preserved joint spaces.  IMPRESSION: Osteopenia.  No acute  osseous finding.   Original Report Authenticated By: Judie Petit. Ruel Favors, M.D.    US Renal  06/05/2012  *RADIOLOGY REPORT*  Clinical Data: Acute renal insufficiency  RENAL/URINARY TRACT ULTRASOUND COMPLETE  Comparison:  05/19/2006  Findings:  Right Kidney:  8.2 cm in length.  No hydronephrosis or focal lesion.  Isoechoic or slightly echogenic compared to the adjacent liver.  Left Kidney:  10.2 cm in length.  No focal lesion or hydronephrosis.  Bladder:  Decompressed by Foley catheter  IMPRESSION:  1.  No hydronephrosis. 2.  Echogenic renal parenchyma, a nonspecific indicator of medical renal disease. 3.  Asymmetric kidney sizes, left greater than right, suggesting right renal artery stenosis or previous parenchymal insult.   Original Report Authenticated By: Thora Lance III, M.D.     Scheduled Meds:    . aspirin EC  81 mg Oral Daily  . FLUoxetine  20 mg Oral Daily  . hydrALAZINE  100 mg Oral TID  . metoCLOPramide  5 mg Oral TID AC  . multivitamin with minerals  1 tablet Oral Daily  . ondansetron  4 mg Oral Q6H  . pantoprazole  40 mg Oral BID AC  . saccharomyces boulardii  250 mg Oral BID  . sodium chloride  3 mL Intravenous Q12H  . warfarin  1 mg Oral ONCE-1800  . Warfarin - Pharmacist Dosing Inpatient   Does not apply q1800  . DISCONTD: piperacillin-tazobactam (ZOSYN)  IV  2.25 g Intravenous Q6H   Continuous Infusions:    Principal Problem:  *Pulmonary embolism Active Problems:  Irritable bowel syndrome  UTI (urinary tract infection)  Pain in left shoulder   Nausea  CKD (chronic kidney disease) stage 4, GFR 15-29 ml/min  Accelerated hypertension  Fall  Rigors    Time spent: 30 minutes    Mahala Menghini [facility]  [facility] Pager [phone]. If 8PM-8AM, please contact night-coverage at [email or web address], password [facility] 06/07/2012, 4:25 PM  LOS: 7 days

## 2012-06-08 ENCOUNTER — Inpatient Hospital Stay (HOSPITAL_COMMUNITY): Payer: Medicare Other

## 2012-06-08 LAB — PROTIME-INR
INR: 2.88 — ABNORMAL HIGH (ref 0.00–1.49)
Prothrombin Time: 28.7 seconds — ABNORMAL HIGH (ref 11.6–15.2)

## 2012-06-08 LAB — AMMONIA: Ammonia: 18 umol/L (ref 11–60)

## 2012-06-08 LAB — CBC
MCHC: 33.1 g/dL (ref 30.0–36.0)
RDW: 14.1 % (ref 11.5–15.5)
WBC: 8 10*3/uL (ref 4.0–10.5)

## 2012-06-08 LAB — RENAL FUNCTION PANEL
Albumin: 2.7 g/dL — ABNORMAL LOW (ref 3.5–5.2)
GFR calc Af Amer: 22 mL/min — ABNORMAL LOW (ref >90)
GFR calc non Af Amer: 19 mL/min — ABNORMAL LOW (ref >90)
Phosphorus: 3.6 mg/dL (ref 2.3–4.6)
Potassium: 3.7 mEq/L (ref 3.5–5.1)
Sodium: 135 mEq/L (ref 135–145)

## 2012-06-08 MED ORDER — SIMETHICONE 40 MG/0.6ML PO SUSP: 40.0000 mg | Freq: Once | ORAL | Status: DC

## 2012-06-08 MED ORDER — WARFARIN 0.5 MG HALF TABLET
0.5000 mg | ORAL_TABLET | Freq: Once | ORAL | Status: AC
  Administered 2012-06-08: 0.5 mg via ORAL
  Filled 2012-06-08: qty 1

## 2012-06-08 MED ORDER — SIMETHICONE 80 MG PO CHEW
80.0000 mg | CHEWABLE_TABLET | Freq: Once | ORAL | Status: AC
  Administered 2012-06-08: 80 mg via ORAL
  Filled 2012-06-08: qty 1

## 2012-06-08 NOTE — Progress Notes (Signed)
[facility] PROGRESS NOTE  Nicole Harding:[id number] DOB: [birth year] DOA: 05/31/2012 PCP: [name], MD  Assessment/Plan:  *Pulmonary embolism:  VQ scan was interpreted as intermediate acute pulmonary embolism, however, after reviewing with radiology, feel that PE is likely, and patient has recent history of stasis and has been on estrogen therapy.  Her lower extremity duplex was negative and she does not have evidence of right heart dysfunction on ECHO.  - INR at goal, heparin discontinued on 10/25. -She has an oxygen requirement which will need to be weaned carefully and closely monitored as an out-patient  Seizure? DDx benign sleep myoclonus, Tremors-Has been told in the past might have had Atypical Parkinson's per [facility] -She has a witnessed episode of shaking 06/08/12-Neurology has seen her and recommended a work-up inclusive of 1.  MRI cervical/Thoracic spine showing no specific Myelopathy 2. Ammonia was 18 -She potentially very well might have parkinson's and may benefit from a repeat trial of Sinemet? Suggest Copper, Vitamin E, Vitamin B12 Appreciate Neruology input -Unclear what other significance to make of Hyperreflexia and clonus-this has not recurred per PT's nurse/husband today  Nausea/vomiting/diarrhea:  Initially improved on decreased dose of fluoxetine, then worsened.  GI was consulted.  DDx includes central etiology versus motility disorder versus infection.  Diarrhea less likely related to central process. -  GI consultation:  Appreciate recommendations-signed off 10/31 -  C diff test negative -  Reduce dose of fluoxetine gradually:  Day 6/7 of  Fluoxetine 20mg  dose-to discontinue 06/10/12 -  Zofran was discontinued and was reimplemented 06/06/2012 -  Upper GI series showed severe GERD-risks versus benefits of promotility agents will be discussed-I am hearing on that side of starting Reglan 3 times a day by mouth 10/30  Rigors elevated  HR. - Blood cultures and urine culture negative 10/29 - CXR neg - Vancomycin and zosyn started on 10/29 [broadening from Rocephin started 10/26] -De-escalated to Doxycyclin 100 bid 10/31  AKI in setting of CKD (chronic kidney disease) stage 4, GFR 15-29 ml/min+ hyponatremia, c mild Metabolic Acidosis.  May be prerenal from recent vomiting however FeNa is 1.34.  No recent contrasted studies.  Has been voiding regularly. Likely responsible for hyponatremia - Was on Ns 75-d/c'd - Foley to monitor strict I/O overnight -  D/C asap-voiding trial done 10/30 -Repeat labs in the morning-labs seem to have stabilized,  minimize use of vancomycin [discontinued 06/06/12]  Accelerated hypertension:  -  Continue hydralazine 100 mg 3 times a day -  Hold labetalol [expect rebound tachycardia]  Mechanical Fall:  Patient endorses balance problems and falls.  - PT evaluation:  Recommended SNF-Patient seems to agree to this today once options presented to her on 06/08/12 -  Up to chair -  Up with assistance -  Fall precautions  Anemia, significant drop in all blood count on 10/26, but stable on 10/27. - Likely dilutational and hemoglobin only mildly depressed.   - Repeat CBC if signs of bleeding.  Left ankle pain ? Gout versus fracture-get x-ray and reassess-no pain today.  Reassure-likely was gout, and has not recurred  DIET:  Vegetarian per patient request ACCESS:  PIV IVF:  OFF PROPH:  Therapeutic on Coumadin, Heparin GTT d/c'd  Code Status: Full code Family Communication:  Spoke with patient and husband patient's daughter who is an Charity fundraiser Disposition Plan:  Pending therapeutic INR and PT evaluation, to home with home health PT  Chart review  Seen by Dr. Patty Sermons in the outpatient setting 01/2012-noted per that history  of irritable bowel syndrome and history of chronic areolar deficiency-she had a normal stress test in 2003 by Dr. Shirlee Latch and a remote echocardiogram was unremarkable  Had been on  Premarin 0.625 mg daily which was decreased in the outpatient setting to 0.3 mg, given risk of breast cancer?-was counseled regarding this per Dr. Yevonne Pax last note  Multiple episodes of presyncopal symptoms and orthostatic hypotension per Dr. Yevonne Pax notes  ? History of atypical Parkinson's-seen by [facility] neurology in 2011 and start her carbidopa levodopa per discharge summary dated 10/16/2009  h/o complete rotator cuff tendon tear on the left with severe impingement syndrome on the left shoulder operated upon by Dr. Annitta Jersey in 2000   Consultants:  GI  Procedures:  NM VQ scan 10/25  Upper GI series done 10/29 = spontaneous gastric stage her reflux with no stricture, mild tertiary contractions distal esophagus, stomach duodenum unremarkable   Antibiotics:  None given  HPI/Subjective:  Patient states that she continues to feel weak, tolerated PO well at lunch, no breakfast She states today she feels "miserable"-cannot tell me what the problem is 2 episodes of diarrhoea No nausea at present  No chest pain no shortness of breath no blurred or double vision By mouth intake has improved and seems to be improving. Hasn't been oob or off oxygen since admit  Objective: Filed Vitals:   06/08/12 1108 06/08/12 1400 06/08/12 1555 06/08/12 1619  BP: 130/80 176/82  180/90  Pulse:  78  74  Temp:  98 F (36.7 C)  97.9 F (36.6 C)  TempSrc:    Oral  Resp:  18    Height:      Weight:      SpO2:  92% 84% 94%    Intake/Output Summary (Last 24 hours) at 06/08/12 1623 Last data filed at 06/08/12 1200  Gross per 24 hour  Intake    480 ml  Output   1150 ml  Net   -670 ml   Filed Weights   06/01/12 1005  Weight: 65.4 kg (144 lb 2.9 oz)    Exam:   General:  Caucasian female, lying in bed  HEENT:  MMM  Cardiovascular:  Regular rhythm, 2/6 systolic murmur at LSB  Respiratory: CTAB  Abdomen: NABS, soft, nondistended, nontender  MSK:  No LEE  Neuro-Hyperreflxic  throughout.  No clonus.  No tremors.   Skin: No new bruises  Data Reviewed: Basic Metabolic Panel:  Lab 06/08/12 4098 06/07/12 0625 06/05/12 1611 06/03/12 0521 06/02/12 0605  NA 135 133* 131* 135 138  K 3.7 3.9 3.5 4.0 3.7  CL 102 102 99 104 107  CO2 21 18* 19 19 20   GLUCOSE 105* 119* 114* 94 91  BUN 19 20 23  24* 29*  CREATININE 2.19* 2.21* 2.13* 1.62* 1.60*  CALCIUM 8.6 8.6 8.2* 8.5 8.3*  MG -- -- -- -- --  PHOS 3.6 -- -- -- --   Liver Function Tests:  Lab 06/08/12 0605 06/07/12 0625 06/05/12 1611  AST -- 43* 27  ALT -- 31 15  ALKPHOS -- 53 53  BILITOT -- 0.4 0.1*  PROT -- 5.8* 5.6*  ALBUMIN 2.7* 2.8* 2.8*   No results found for this basename: LIPASE:5,AMYLASE:5 in the last 168 hours  Lab 06/08/12 1430  AMMONIA 18   CBC:  Lab 06/08/12 0605 06/07/12 0625 06/06/12 0606 06/05/12 1611 06/05/12 0540  WBC 8.0 10.5 11.6* 7.5 7.5  NEUTROABS -- -- -- 5.7 --  HGB 10.6* 10.3* 10.5* 10.3* 11.1*  HCT 32.0*  31.2* 31.3* 30.8* 33.5*  MCV 90.1 90.7 90.5 90.1 90.8  PLT 276 245 252 261 274   Cardiac Enzymes: No results found for this basename: CKTOTAL:5,CKMB:5,CKMBINDEX:5,TROPONINI:5 in the last 168 hours BNP (last 3 results) No results found for this basename: PROBNP:3 in the last 8760 hours CBG:  Lab 06/05/12 1516  GLUCAP 119*    Recent Results (from the past 240 hour(s))  URINE CULTURE     Status: Normal   Collection Time   05/31/12 10:11 PM      Component Value Range Status Comment   Specimen Description URINE, RANDOM   Final    Special Requests CX ADDED AT 2237   Final    Culture  Setup Time 05/31/2012 23:02   Final    Colony Count 40,000 COLONIES/ML   Final    Culture     Final    Value: Multiple bacterial morphotypes present, none predominant. Suggest appropriate recollection if clinically indicated.   Report Status 06/01/2012 FINAL   Final   URINE CULTURE     Status: Normal   Collection Time   06/02/12  5:22 PM      Component Value Range Status Comment    Specimen Description URINE, CLEAN CATCH   Final    Special Requests NONE   Final    Culture  Setup Time 06/03/2012 02:00   Final    Colony Count 40,000 COLONIES/ML   Final    Culture     Final    Value: LACTOBACILLUS SPECIES     Note: Standardized susceptibility testing for this organism is not available.   Report Status 06/04/2012 FINAL   Final   CLOSTRIDIUM DIFFICILE BY PCR     Status: Normal   Collection Time   06/04/12  8:30 PM      Component Value Range Status Comment   C difficile by pcr NEGATIVE  NEGATIVE Final   CULTURE, BLOOD (ROUTINE X 2)     Status: Normal (Preliminary result)   Collection Time   06/05/12  4:00 PM      Component Value Range Status Comment   Specimen Description BLOOD RIGHT HAND   Final    Special Requests BOTTLES DRAWN AEROBIC ONLY 5CC   Final    Culture  Setup Time 06/05/2012 23:05   Final    Culture     Final    Value:        BLOOD CULTURE RECEIVED NO GROWTH TO DATE CULTURE WILL BE HELD FOR 5 DAYS BEFORE ISSUING A FINAL NEGATIVE REPORT   Report Status PENDING   Incomplete   CULTURE, BLOOD (ROUTINE X 2)     Status: Normal (Preliminary result)   Collection Time   06/05/12  4:06 PM      Component Value Range Status Comment   Specimen Description BLOOD LEFT HAND   Final    Special Requests BOTTLES DRAWN AEROBIC ONLY 10CC   Final    Culture  Setup Time 06/05/2012 23:05   Final    Culture     Final    Value:        BLOOD CULTURE RECEIVED NO GROWTH TO DATE CULTURE WILL BE HELD FOR 5 DAYS BEFORE ISSUING A FINAL NEGATIVE REPORT   Report Status PENDING   Incomplete   URINE CULTURE     Status: Normal   Collection Time   06/05/12  5:00 PM      Component Value Range Status Comment   Specimen Description URINE, CATHETERIZED   Final  Special Requests NONE   Final    Culture  Setup Time 06/05/2012 17:57   Final    Colony Count NO GROWTH   Final    Culture NO GROWTH   Final    Report Status 06/06/2012 FINAL   Final      Studies: Mr Cervical Spine Wo  Contrast  06/08/2012  *RADIOLOGY REPORT*  Clinical Data:  Elderly patient with multiple falls.  Cervical thoracic myelopathy.  MRI CERVICAL AND THORACIC SPINE WITHOUT CONTRAST  Technique:  Multiplanar and multiecho pulse sequences of the cervical spine, to include the craniocervical junction and cervicothoracic junction, and the thoracic spine, were obtained without intravenous contrast.  Comparison:  Chest radiographs 05/31/2012.  MRI CERVICAL SPINE  Findings:  Study is mildly motion degraded.  The cervical alignment is normal.  There is no evidence of acute fracture or acute paraspinal abnormality.  There is a 6 mm left para esophageal nodule at C6-C7 which is nonspecific.  This may reflect a lymph node.  Atrophy is noted in the posterior fossa.  There is a small lacunar infarct within the pons.  The cervical cord is normal in signal and caliber.  Bilateral vertebral artery flow voids are noted.  Mild pannus formation is noted surrounding the odontoid process. There is no osseous erosion. At C2-C3, there is asymmetric facet hypertrophy on the right with mild resulting right foraminal stenosis.  C3-C4: High signal within the disc on T1 and T2-weighted images is probably due to auto fusion with posterior osteophytes.  The facet joints also appear fused.  Uncinate spurring contributes to moderate right greater than left foraminal stenosis.  C4-C5:  Uncinate spurring and bilateral facet hypertrophy contribute to moderate right and mild left foraminal stenosis.  C5-C6:  There is possible fusion across the right facet joint. There is mild uncinate spurring.  No significant spinal stenosis or nerve root encroachment.  C6-C7:  There are prominent anterior osteophytes and bilateral facet hypertrophy.  No significant spinal stenosis or nerve root encroachment.  C7-T1:  Bilateral facet hypertrophy.  No spinal stenosis or nerve root encroachment.  IMPRESSION:  1.  No cervical cord compression or evidence of cervical  myelopathy. 2.  Multilevel spondylosis with uncinate spurring and facet disease as described.  There is probable auto fusion at C3-C4.  The right C5-C6 facet joint may be fused as well. There is mild to moderate foraminal stenosis throughout the cervical spine as detailed above. 3.  No acute osseous findings or malalignment.  MRI THORACIC SPINE  Findings: The thoracic alignment is normal.  There is no evidence of acute fracture or paraspinal abnormality.  There are paraspinal osteophytes throughout the thoracic spine.  The thoracic cord is normal in signal and caliber.  The conus medullaris extends to the L1 level.  There are scattered small thoracic disc protrusions.  There is no cord deformity, foraminal compromise or nerve root encroachment. Small bilateral pleural effusions are noted incidentally.  IMPRESSION:  1.  Minimal thoracic spondylosis.  No significant spinal stenosis or nerve root encroachment. 2.  No evidence of thoracic myelopathy. 3.  No acute osseous findings.   Original Report Authenticated By: Carey Bullocks, M.D.    Mr Thoracic Spine Wo Contrast  06/08/2012  *RADIOLOGY REPORT*  Clinical Data:  Elderly patient with multiple falls.  Cervical thoracic myelopathy.  MRI CERVICAL AND THORACIC SPINE WITHOUT CONTRAST  Technique:  Multiplanar and multiecho pulse sequences of the cervical spine, to include the craniocervical junction and cervicothoracic junction, and the thoracic spine,  were obtained without intravenous contrast.  Comparison:  Chest radiographs 05/31/2012.  MRI CERVICAL SPINE  Findings:  Study is mildly motion degraded.  The cervical alignment is normal.  There is no evidence of acute fracture or acute paraspinal abnormality.  There is a 6 mm left para esophageal nodule at C6-C7 which is nonspecific.  This may reflect a lymph node.  Atrophy is noted in the posterior fossa.  There is a small lacunar infarct within the pons.  The cervical cord is normal in signal and caliber.  Bilateral  vertebral artery flow voids are noted.  Mild pannus formation is noted surrounding the odontoid process. There is no osseous erosion. At C2-C3, there is asymmetric facet hypertrophy on the right with mild resulting right foraminal stenosis.  C3-C4: High signal within the disc on T1 and T2-weighted images is probably due to auto fusion with posterior osteophytes.  The facet joints also appear fused.  Uncinate spurring contributes to moderate right greater than left foraminal stenosis.  C4-C5:  Uncinate spurring and bilateral facet hypertrophy contribute to moderate right and mild left foraminal stenosis.  C5-C6:  There is possible fusion across the right facet joint. There is mild uncinate spurring.  No significant spinal stenosis or nerve root encroachment.  C6-C7:  There are prominent anterior osteophytes and bilateral facet hypertrophy.  No significant spinal stenosis or nerve root encroachment.  C7-T1:  Bilateral facet hypertrophy.  No spinal stenosis or nerve root encroachment.  IMPRESSION:  1.  No cervical cord compression or evidence of cervical myelopathy. 2.  Multilevel spondylosis with uncinate spurring and facet disease as described.  There is probable auto fusion at C3-C4.  The right C5-C6 facet joint may be fused as well. There is mild to moderate foraminal stenosis throughout the cervical spine as detailed above. 3.  No acute osseous findings or malalignment.  MRI THORACIC SPINE  Findings: The thoracic alignment is normal.  There is no evidence of acute fracture or paraspinal abnormality.  There are paraspinal osteophytes throughout the thoracic spine.  The thoracic cord is normal in signal and caliber.  The conus medullaris extends to the L1 level.  There are scattered small thoracic disc protrusions.  There is no cord deformity, foraminal compromise or nerve root encroachment. Small bilateral pleural effusions are noted incidentally.  IMPRESSION:  1.  Minimal thoracic spondylosis.  No significant  spinal stenosis or nerve root encroachment. 2.  No evidence of thoracic myelopathy. 3.  No acute osseous findings.   Original Report Authenticated By: Carey Bullocks, M.D.     Scheduled Meds:    . aspirin EC  81 mg Oral Daily  . FLUoxetine  20 mg Oral Daily  . hydrALAZINE  100 mg Oral TID  . metoCLOPramide  5 mg Oral TID AC  . multivitamin with minerals  1 tablet Oral Daily  . ondansetron  4 mg Oral Q6H  . pantoprazole  40 mg Oral BID AC  . saccharomyces boulardii  250 mg Oral BID  . sodium chloride  3 mL Intravenous Q12H  . warfarin  0.5 mg Oral ONCE-1800  . Warfarin - Pharmacist Dosing Inpatient   Does not apply q1800   Continuous Infusions:    Principal Problem:  *Pulmonary embolism Active Problems:  Irritable bowel syndrome  UTI (urinary tract infection)  Pain in left shoulder   Nausea  CKD (chronic kidney disease) stage 4, GFR 15-29 ml/min  Accelerated hypertension  Fall  Rigors  Cervical myelopathy    Time spent: 30 minutes  Rhetta Mura  [facility] Pager [phone]. If 8PM-8AM, please contact night-coverage at [email or web address], password [facility] 06/08/2012, 4:23 PM  LOS: 8 days

## 2012-06-08 NOTE — Progress Notes (Signed)
Physical Therapy Treatment Patient Details Name: Nicole Harding MRN: [id number] DOB: [birth year] Today's Date: 06/08/2012 Time: 7425-9563 PT Time Calculation (min): 24 min  PT Assessment / Plan / Recommendation Comments on Treatment Session  Pt willing to transfer to chair this session, as requested by RN. Pt still extremely weak requiring increased assistance with bed mobility, although motivated to get better. Continue per plan    Follow Up Recommendations  Post acute inpatient     Does the patient have the potential to tolerate intense rehabilitation  No, Recommend SNF  Barriers to Discharge        Equipment Recommendations  None recommended by PT    Recommendations for Other Services    Frequency Min 2X/week   Plan Discharge plan needs to be updated;Frequency remains appropriate    Precautions / Restrictions Precautions Precautions: Fall Restrictions Weight Bearing Restrictions: No   Pertinent Vitals/Pain Pain in LLE during bed mobility. RN in room and aware. No number given    Mobility  Bed Mobility Bed Mobility: Supine to Sit;Sitting - Scoot to Edge of Bed Rolling Right: 4: Min assist Rolling Left: 4: Min assist Supine to Sit: 3: Mod assist Sitting - Scoot to Edge of Bed: 3: Mod assist Details for Bed Mobility Assistance: Assist through trunk. Cues for hand placement and sequencing throughout. Rolling to complete hygiene, pt complained of LLE pain, RN in room Transfers Transfers: Sit to Stand;Stand to Sit;Stand Pivot Transfers Sit to Stand: 3: Mod assist;With upper extremity assist;From bed Stand to Sit: 3: Mod assist;With upper extremity assist;To chair/3-in-1 Stand Pivot Transfers: 2: Max assist;With armrests Details for Transfer Assistance: VC for hand placement. Assist through trunk and pelvis into standing upright during transition. Cues for sequencing, pt able to minimally pivot LEs from bed to chair. RN assisted with hip  navigation Ambulation/Gait Ambulation/Gait Assistance: Not tested (comment)    Exercises     PT Diagnosis:    PT Problem List:   PT Treatment Interventions:     PT Goals Acute Rehab PT Goals PT Goal: Sit to Stand - Progress: Progressing toward goal PT Goal: Ambulate - Progress: Not progressing  Visit Information  Last PT Received On: 06/08/12 Assistance Needed: +2    Subjective Data  Subjective: "I feel really weak"   Cognition  Overall Cognitive Status: History of cognitive impairments - at baseline Arousal/Alertness: Awake/alert Orientation Level: Appears intact for tasks assessed Behavior During Session: [facility] for tasks performed    Balance     End of Session PT - End of Session Equipment Utilized During Treatment: Gait belt Activity Tolerance: Patient limited by fatigue Patient left: in chair;with call bell/phone within reach;with family/visitor present;with nursing in room Nurse Communication: Mobility status   GP     Milana Kidney 06/08/2012, 5:21 PM

## 2012-06-08 NOTE — Progress Notes (Signed)
Clinical Social Worker received referral from MD indicating pt and family are now interested in SNF.  CSW attempted to speak with pt who is currently resting.  CSW left voicemail message with pt's spouse and phoned pt's dtr-Donna.  CSW reviewed SNF process, dtr states everyone is in agreement and will meet with MD tomorrow.  CSW began SNF search and provided list of SNF beds in the area to allow dtr and family opportunity to begin researching facilities.  CSW to continue to follow and assist as needed.   Angelia Mould, MSW, [place] [phone]

## 2012-06-08 NOTE — Progress Notes (Signed)
ANTICOAGULATION CONSULT NOTE - Follow Up Consult  Pharmacy Consult for Coumadin Indication: Suspected PE  Allergies  Allergen Reactions  . Amlodipine Nausea And Vomiting and Swelling    Swelling --sick and ended up in hospital N&V  . Codeine Nausea And Vomiting  . Lipitor (Atorvastatin Calcium) Other (See Comments)    "cramps my legs"  . Lisinopril Other (See Comments)    unknown  . Red Yeast Rice Other (See Comments)    unknown  . Zetia (Ezetimibe) Other (See Comments)    unknown    Patient Measurements: Height: 5\' 5"  (165.1 cm) Weight: 144 lb 2.9 oz (65.4 kg) IBW/kg (Calculated) : 57  Heparin Dosing Weight:   Vital Signs: Temp: 97.4 F (36.3 C) (11/01 0528) Temp src: Oral (11/01 0528) BP: 154/67 mmHg (11/01 0528) Pulse Rate: 75  (11/01 0528)  Labs:  Basename 06/08/12 1610 06/07/12 0625 06/06/12 0606 06/05/12 1611  HGB 10.6* 10.3* -- --  HCT 32.0* 31.2* 31.3* --  PLT 276 245 252 --  APTT -- -- -- --  LABPROT 28.7* 33.6* 27.8* --  INR 2.88* 3.57* 2.76* --  HEPARINUNFRC -- -- 0.30 --  CREATININE 2.19* 2.21* -- 2.13*  CKTOTAL -- -- -- --  CKMB -- -- -- --  TROPONINI -- -- -- --    Estimated Creatinine Clearance: 16.6 ml/min (by C-G formula based on Cr of 2.19).  Assessment: 88yof on Coumadin for suspected PE. Heparin bridge was completed 10/30. INR (2.88) is now therapeutic after decreasing with held dose. INR significantly jumped on 10/31 unexpectedly - unsure of reason: possible drug interactions - antibiotics (Vancomycin and Zosyn now discontinued), Prozac; AST/ALT trending up. Will restart Coumadin with lower dose and follow-up AM INR. - H/H and Plts improved - No significant bleeding reported  Goal of Therapy:  INR 2-3   Plan:  1. Coumadin 0.5mg  po x 1 today 2. Follow-up AM INR  3. Monitor LFTs and s/sx of bleeding  Cleon Dew [phone] 06/08/2012,8:33 AM

## 2012-06-08 NOTE — Progress Notes (Signed)
Patient continues to have nausea/diarrhea. Not eating well.   Exam: Filed Vitals:   06/08/12 0528  BP: 154/67  Pulse: 75  Temp: 97.4 F (36.3 C)  Resp: 18   MS: Awake, Alert, oriented to person place and year, gives month as February.  CN: PERRL, EOMI Motor: normal strength.  Cerebellar: intention tremor bilaterally DTR: 3+ in the biceps, BR, patella and ankles. +pectorals bilaterally, +crossed adductors  Impression: 78 yo F with diffuse hyperreflexia,urinary incontinence, and difficulty walking. I suspect a myelopathy, will evaluate for B12 deficiency and spine imaging. Also n/v coupled with hyperreflexia and mild confusion can occur with a metabolic encephalopathy.  1) MRI C-spine, T-Spine 2) B12 3) Ammonia.   Ritta Slot, MD [facility] [phone]  If 7pm- 7am, please page neurology on call at [phone].

## 2012-06-09 LAB — CBC
Hemoglobin: 11.9 g/dL — ABNORMAL LOW (ref 12.0–15.0)
MCH: 30.2 pg (ref 26.0–34.0)
MCHC: 33.3 g/dL (ref 30.0–36.0)
MCV: 90.6 fL (ref 78.0–100.0)
RBC: 3.94 MIL/uL (ref 3.87–5.11)

## 2012-06-09 LAB — VITAMIN B12: Vitamin B-12: 365 pg/mL (ref 211–911)

## 2012-06-09 MED ORDER — HYDRALAZINE HCL 50 MG PO TABS
100.0000 mg | ORAL_TABLET | Freq: Three times a day (TID) | ORAL | Status: DC
  Administered 2012-06-09 – 2012-06-11 (×6): 100 mg via ORAL
  Filled 2012-06-09 (×9): qty 2

## 2012-06-09 MED ORDER — WARFARIN SODIUM 1 MG PO TABS
1.0000 mg | ORAL_TABLET | Freq: Once | ORAL | Status: AC
  Administered 2012-06-09: 1 mg via ORAL
  Filled 2012-06-09: qty 1

## 2012-06-09 MED ORDER — HYDRALAZINE HCL 50 MG PO TABS: 200.0000 mg | ORAL_TABLET | Freq: Three times a day (TID) | ORAL | Status: DC

## 2012-06-09 MED ORDER — METOPROLOL TARTRATE 1 MG/ML IV SOLN
5.0000 mg | Freq: Once | INTRAVENOUS | Status: AC
  Administered 2012-06-09: 5 mg via INTRAVENOUS

## 2012-06-09 MED ORDER — METOPROLOL TARTRATE 1 MG/ML IV SOLN
INTRAVENOUS | Status: AC
  Administered 2012-06-09: 5 mg via INTRAVENOUS
  Filled 2012-06-09: qty 5

## 2012-06-09 MED ORDER — OXYBUTYNIN CHLORIDE ER 5 MG PO TB24
5.0000 mg | ORAL_TABLET | Freq: Every day | ORAL | Status: DC
  Administered 2012-06-09 – 2012-06-10 (×2): 5 mg via ORAL
  Filled 2012-06-09 (×3): qty 1

## 2012-06-09 NOTE — Progress Notes (Signed)
Clamped Foley at 1130 per md order, pt did not feel the urge to void after 2 hours, unclamped catheter, will continue to monitor.

## 2012-06-09 NOTE — Progress Notes (Signed)
[facility] PROGRESS NOTE  Nicole Harding XBJ:[id number] DOB: 1923/12/26 DOA: 05/31/2012 PCP: [name], MD  Assessment/Plan:  *Pulmonary embolism:  VQ scan was interpreted as intermediate acute pulmonary embolism, however, after reviewing with radiology, feel that PE is likely, and patient has recent history of stasis and has been on estrogen therapy.  Her lower extremity duplex was negative and she does not have evidence of right heart dysfunction on ECHO.  - INR at goal, heparin discontinued on 10/25.-She has an oxygen requirement which will need to be weaned carefully and closely monitored as an out-patient  Seizure? DDx benign sleep myoclonus, Tremors-Has been told in the past might have had Atypical Parkinson's per [facility] Neurology -She has a witnessed episode of shaking 06/08/12-Neurology has seen her and recommended a work-up inclusive of 1.  MRI cervical/Thoracic spine showing no specific Myelopathy 2. Ammonia was 18 -She potentially very well might have parkinson's and may benefit from a repeat trial of Sinemet? Suggest Copper, Vitamin E, Vitamin B12 Appreciate Neruology input -Unclear what other significance to make of Hyperreflexia and clonus-this has not recurred per PT's nurse/husband today  Nausea/vomiting/diarrhea:  Initially improved on decreased dose of fluoxetine, then worsened.  GI was consulted.  DDx includes central etiology versus motility disorder versus infection.  Diarrhea less likely related to central process. -  GI consultation:  Appreciate recommendations-signed off 10/31 -  C diff test negative -  Reduce dose of fluoxetine gradually:  Day 7/7 of  Fluoxetine 20mg  dose-to discontinue 06/10/12 -  Zofran was discontinued and was reimplemented 06/06/2012 -  Upper GI series showed severe GERD-risks versus benefits of promotility agents will be discussed-I am hearing on that side of starting Reglan 3 times a day by mouth 10/30  Rigors elevated HR. -  Blood cultures and urine culture negative 10/29 - CXR neg - Vancomycin and zosyn started on 10/29 [broadening from Rocephin started 10/26] -De-escalated to Doxycycline 100 bid 10/31  AKI in setting of CKD (chronic kidney disease) stage 4, GFR 15-29 ml/min+ hyponatremia, c mild Metabolic Acidosis.  May be prerenal from recent vomiting however FeNa is 1.34.  No recent contrasted studies.  Has been voiding regularly. Likely responsible for hyponatremia - Was on Ns 75-d/c'd - Foley to monitor strict I/O overnight -  D/C asap-voiding trial done 10/30 -Repeat labs in the morning-labs seem to have stabilized,  minimize use of vancomycin [discontinued 06/06/12]  Accelerated hypertension:  -  Continue hydralazine 100 mg 3 times a day-given her high blood pressures today will increase this to 200 mg 3 times a day, 06/09/2029 -  Hold labetalol [expect rebound tachycardia]  Mechanical Fall:  Patient endorses balance problems and falls.  - PT evaluation:  Recommended SNF-Patient seems to agree to this today once options presented to her on 06/08/12 -  Up to chair -  Up with assistance -  Fall precautions  Anemia, significant drop in all blood count on 10/26, but stable on 10/27. - Likely dilutational and hemoglobin only mildly depressed.   - Repeat CBC if signs of bleeding.  Left ankle pain ? Gout versus fracture-get x-ray and reassess-no pain today.  Reassure-likely was gout, and has not recurred  DIET:  Vegetarian per patient request ACCESS:  PIV IVF:  OFF PROPH:  Therapeutic on Coumadin, Heparin GTT d/c'd  Code Status: Full code Family Communication:  Spoke with patient and husband patient's daughter who is an Charity fundraiser Disposition Plan:  Patient has agreed to go to a skilled nursing facility. 20 minute discussion  with family today regarding goals of care and potential scenarios that may play a patient feels to thrive. I do believe that she is hospice eligible if she loses weight and if she  continues in a declining trajectory however today's visit was reassuring and I think that she will need outpatient Dorene Sorrow psychiatrist evaluation given  Most of her issues seem to stem from anxiety and depression and not any specific organic pathology T  Chart review  Seen by Dr. Patty Sermons in the outpatient setting 01/2012-noted per that history of irritable bowel syndrome and history of chronic areolar deficiency-she had a normal stress test in 2003 by Dr. Shirlee Latch and a remote echocardiogram was unremarkable  Had been on Premarin 0.625 mg daily which was decreased in the outpatient setting to 0.3 mg, given risk of breast cancer?-was counseled regarding this per Dr. Yevonne Pax last note  Multiple episodes of presyncopal symptoms and orthostatic hypotension per Dr. Yevonne Pax notes  ? History of atypical Parkinson's-seen by [facility] neurology in 2011 and start her carbidopa levodopa per discharge summary dated 10/16/2009  h/o complete rotator cuff tendon tear on the left with severe impingement syndrome on the left shoulder operated upon by Dr. Annitta Jersey in 2000  Consultants:  GI  Procedures:  NM VQ scan 10/25  Upper GI series done 10/29 = spontaneous gastric stage her reflux with no stricture, mild tertiary contractions distal esophagus, stomach duodenum unremarkable   Antibiotics:  None given  HPI/Subjective:  Patient is bright and cheerful and smiling and happy to have her daughter at the bedside. She is joking and pleasant and does not even seem to be the same person that she was yesterday. Tolerated breakfast and lunch today without issue No nausea at present  No chest pain no shortness of breath no blurred or double vision By mouth intake has improved and seems to be improving. Hasn't been oob or off oxygen since admit  Objective: Filed Vitals:   06/09/12 0500 06/09/12 0515 06/09/12 1331 06/09/12 1645  BP: 180/84 152/64 193/92 193/99  Pulse: 88  107   Temp: 97.8 F (36.6  C)  97.3 F (36.3 C)   TempSrc: Oral  Oral   Resp:   18   Height:      Weight:      SpO2: 96%  93%     Intake/Output Summary (Last 24 hours) at 06/09/12 1850 Last data filed at 06/09/12 0856  Gross per 24 hour  Intake    240 ml  Output      0 ml  Net    240 ml   Filed Weights   06/01/12 1005  Weight: 65.4 kg (144 lb 2.9 oz)    Exam:   General:  Caucasian female, lying in bed  HEENT:  MMM  Cardiovascular:  Regular rhythm, 2/6 systolic murmur at LSB  Respiratory: CTAB  Abdomen: NABS, soft, nondistended, nontender  MSK:  No LEE  Neuro-Hyperreflxic throughout.  No clonus.  No tremors.   Skin: No new bruises  Data Reviewed: Basic Metabolic Panel:  Lab 06/08/12 4782 06/07/12 0625 06/05/12 1611 06/03/12 0521  NA 135 133* 131* 135  K 3.7 3.9 3.5 4.0  CL 102 102 99 104  CO2 21 18* 19 19  GLUCOSE 105* 119* 114* 94  BUN 19 20 23  24*  CREATININE 2.19* 2.21* 2.13* 1.62*  CALCIUM 8.6 8.6 8.2* 8.5  MG -- -- -- --  PHOS 3.6 -- -- --   Liver Function Tests:  Lab 06/08/12 0605 06/07/12 9562  06/05/12 1611  AST -- 43* 27  ALT -- 31 15  ALKPHOS -- 53 53  BILITOT -- 0.4 0.1*  PROT -- 5.8* 5.6*  ALBUMIN 2.7* 2.8* 2.8*   No results found for this basename: LIPASE:5,AMYLASE:5 in the last 168 hours  Lab 06/08/12 1430  AMMONIA 18   CBC:  Lab 06/09/12 0429 06/08/12 0605 06/07/12 0625 06/06/12 0606 06/05/12 1611  WBC 9.4 8.0 10.5 11.6* 7.5  NEUTROABS -- -- -- -- 5.7  HGB 11.9* 10.6* 10.3* 10.5* 10.3*  HCT 35.7* 32.0* 31.2* 31.3* 30.8*  MCV 90.6 90.1 90.7 90.5 90.1  PLT 361 276 245 252 261   Cardiac Enzymes: No results found for this basename: CKTOTAL:5,CKMB:5,CKMBINDEX:5,TROPONINI:5 in the last 168 hours BNP (last 3 results) No results found for this basename: PROBNP:3 in the last 8760 hours CBG:  Lab 06/05/12 1516  GLUCAP 119*    Recent Results (from the past 240 hour(s))  URINE CULTURE     Status: Normal   Collection Time   05/31/12 10:11 PM       Component Value Range Status Comment   Specimen Description URINE, RANDOM   Final    Special Requests CX ADDED AT 2237   Final    Culture  Setup Time 05/31/2012 23:02   Final    Colony Count 40,000 COLONIES/ML   Final    Culture     Final    Value: Multiple bacterial morphotypes present, none predominant. Suggest appropriate recollection if clinically indicated.   Report Status 06/01/2012 FINAL   Final   URINE CULTURE     Status: Normal   Collection Time   06/02/12  5:22 PM      Component Value Range Status Comment   Specimen Description URINE, CLEAN CATCH   Final    Special Requests NONE   Final    Culture  Setup Time 06/03/2012 02:00   Final    Colony Count 40,000 COLONIES/ML   Final    Culture     Final    Value: LACTOBACILLUS SPECIES     Note: Standardized susceptibility testing for this organism is not available.   Report Status 06/04/2012 FINAL   Final   CLOSTRIDIUM DIFFICILE BY PCR     Status: Normal   Collection Time   06/04/12  8:30 PM      Component Value Range Status Comment   C difficile by pcr NEGATIVE  NEGATIVE Final   CULTURE, BLOOD (ROUTINE X 2)     Status: Normal (Preliminary result)   Collection Time   06/05/12  4:00 PM      Component Value Range Status Comment   Specimen Description BLOOD RIGHT HAND   Final    Special Requests BOTTLES DRAWN AEROBIC ONLY 5CC   Final    Culture  Setup Time 06/05/2012 23:05   Final    Culture     Final    Value:        BLOOD CULTURE RECEIVED NO GROWTH TO DATE CULTURE WILL BE HELD FOR 5 DAYS BEFORE ISSUING A FINAL NEGATIVE REPORT   Report Status PENDING   Incomplete   CULTURE, BLOOD (ROUTINE X 2)     Status: Normal (Preliminary result)   Collection Time   06/05/12  4:06 PM      Component Value Range Status Comment   Specimen Description BLOOD LEFT HAND   Final    Special Requests BOTTLES DRAWN AEROBIC ONLY 10CC   Final    Culture  Setup Time  06/05/2012 23:05   Final    Culture     Final    Value:        BLOOD CULTURE RECEIVED  NO GROWTH TO DATE CULTURE WILL BE HELD FOR 5 DAYS BEFORE ISSUING A FINAL NEGATIVE REPORT   Report Status PENDING   Incomplete   URINE CULTURE     Status: Normal   Collection Time   06/05/12  5:00 PM      Component Value Range Status Comment   Specimen Description URINE, CATHETERIZED   Final    Special Requests NONE   Final    Culture  Setup Time 06/05/2012 17:57   Final    Colony Count NO GROWTH   Final    Culture NO GROWTH   Final    Report Status 06/06/2012 FINAL   Final      Studies: Mr Cervical Spine Wo Contrast  06/08/2012  *RADIOLOGY REPORT*  Clinical Data:  Elderly patient with multiple falls.  Cervical thoracic myelopathy.  MRI CERVICAL AND THORACIC SPINE WITHOUT CONTRAST  Technique:  Multiplanar and multiecho pulse sequences of the cervical spine, to include the craniocervical junction and cervicothoracic junction, and the thoracic spine, were obtained without intravenous contrast.  Comparison:  Chest radiographs 05/31/2012.  MRI CERVICAL SPINE  Findings:  Study is mildly motion degraded.  The cervical alignment is normal.  There is no evidence of acute fracture or acute paraspinal abnormality.  There is a 6 mm left para esophageal nodule at C6-C7 which is nonspecific.  This may reflect a lymph node.  Atrophy is noted in the posterior fossa.  There is a small lacunar infarct within the pons.  The cervical cord is normal in signal and caliber.  Bilateral vertebral artery flow voids are noted.  Mild pannus formation is noted surrounding the odontoid process. There is no osseous erosion. At C2-C3, there is asymmetric facet hypertrophy on the right with mild resulting right foraminal stenosis.  C3-C4: High signal within the disc on T1 and T2-weighted images is probably due to auto fusion with posterior osteophytes.  The facet joints also appear fused.  Uncinate spurring contributes to moderate right greater than left foraminal stenosis.  C4-C5:  Uncinate spurring and bilateral facet hypertrophy  contribute to moderate right and mild left foraminal stenosis.  C5-C6:  There is possible fusion across the right facet joint. There is mild uncinate spurring.  No significant spinal stenosis or nerve root encroachment.  C6-C7:  There are prominent anterior osteophytes and bilateral facet hypertrophy.  No significant spinal stenosis or nerve root encroachment.  C7-T1:  Bilateral facet hypertrophy.  No spinal stenosis or nerve root encroachment.  IMPRESSION:  1.  No cervical cord compression or evidence of cervical myelopathy. 2.  Multilevel spondylosis with uncinate spurring and facet disease as described.  There is probable auto fusion at C3-C4.  The right C5-C6 facet joint may be fused as well. There is mild to moderate foraminal stenosis throughout the cervical spine as detailed above. 3.  No acute osseous findings or malalignment.  MRI THORACIC SPINE  Findings: The thoracic alignment is normal.  There is no evidence of acute fracture or paraspinal abnormality.  There are paraspinal osteophytes throughout the thoracic spine.  The thoracic cord is normal in signal and caliber.  The conus medullaris extends to the L1 level.  There are scattered small thoracic disc protrusions.  There is no cord deformity, foraminal compromise or nerve root encroachment. Small bilateral pleural effusions are noted incidentally.  IMPRESSION:  1.  Minimal thoracic spondylosis.  No significant spinal stenosis or nerve root encroachment. 2.  No evidence of thoracic myelopathy. 3.  No acute osseous findings.   Original Report Authenticated By: Carey Bullocks, M.D.    Mr Thoracic Spine Wo Contrast  06/08/2012  *RADIOLOGY REPORT*  Clinical Data:  Elderly patient with multiple falls.  Cervical thoracic myelopathy.  MRI CERVICAL AND THORACIC SPINE WITHOUT CONTRAST  Technique:  Multiplanar and multiecho pulse sequences of the cervical spine, to include the craniocervical junction and cervicothoracic junction, and the thoracic spine, were  obtained without intravenous contrast.  Comparison:  Chest radiographs 05/31/2012.  MRI CERVICAL SPINE  Findings:  Study is mildly motion degraded.  The cervical alignment is normal.  There is no evidence of acute fracture or acute paraspinal abnormality.  There is a 6 mm left para esophageal nodule at C6-C7 which is nonspecific.  This may reflect a lymph node.  Atrophy is noted in the posterior fossa.  There is a small lacunar infarct within the pons.  The cervical cord is normal in signal and caliber.  Bilateral vertebral artery flow voids are noted.  Mild pannus formation is noted surrounding the odontoid process. There is no osseous erosion. At C2-C3, there is asymmetric facet hypertrophy on the right with mild resulting right foraminal stenosis.  C3-C4: High signal within the disc on T1 and T2-weighted images is probably due to auto fusion with posterior osteophytes.  The facet joints also appear fused.  Uncinate spurring contributes to moderate right greater than left foraminal stenosis.  C4-C5:  Uncinate spurring and bilateral facet hypertrophy contribute to moderate right and mild left foraminal stenosis.  C5-C6:  There is possible fusion across the right facet joint. There is mild uncinate spurring.  No significant spinal stenosis or nerve root encroachment.  C6-C7:  There are prominent anterior osteophytes and bilateral facet hypertrophy.  No significant spinal stenosis or nerve root encroachment.  C7-T1:  Bilateral facet hypertrophy.  No spinal stenosis or nerve root encroachment.  IMPRESSION:  1.  No cervical cord compression or evidence of cervical myelopathy. 2.  Multilevel spondylosis with uncinate spurring and facet disease as described.  There is probable auto fusion at C3-C4.  The right C5-C6 facet joint may be fused as well. There is mild to moderate foraminal stenosis throughout the cervical spine as detailed above. 3.  No acute osseous findings or malalignment.  MRI THORACIC SPINE  Findings: The  thoracic alignment is normal.  There is no evidence of acute fracture or paraspinal abnormality.  There are paraspinal osteophytes throughout the thoracic spine.  The thoracic cord is normal in signal and caliber.  The conus medullaris extends to the L1 level.  There are scattered small thoracic disc protrusions.  There is no cord deformity, foraminal compromise or nerve root encroachment. Small bilateral pleural effusions are noted incidentally.  IMPRESSION:  1.  Minimal thoracic spondylosis.  No significant spinal stenosis or nerve root encroachment. 2.  No evidence of thoracic myelopathy. 3.  No acute osseous findings.   Original Report Authenticated By: Carey Bullocks, M.D.     Scheduled Meds:    . aspirin EC  81 mg Oral Daily  . FLUoxetine  20 mg Oral Daily  . hydrALAZINE  100 mg Oral TID  . multivitamin with minerals  1 tablet Oral Daily  . ondansetron  4 mg Oral Q6H  . oxybutynin  5 mg Oral QHS  . pantoprazole  40 mg Oral BID AC  . simethicone  80 mg Oral Once  . sodium chloride  3 mL Intravenous Q12H  . warfarin  1 mg Oral ONCE-1800  . Warfarin - Pharmacist Dosing Inpatient   Does not apply q1800   Continuous Infusions:    Principal Problem:  *Pulmonary embolism Active Problems:  Irritable bowel syndrome  UTI (urinary tract infection)  Pain in left shoulder   Nausea  CKD (chronic kidney disease) stage 4, GFR 15-29 ml/min  Accelerated hypertension  Fall  Rigors  Cervical myelopathy    Time spent: 30 minutes    Mahala Menghini [facility]  [facility] Pager [phone]. If 8PM-8AM, please contact night-coverage at [email or web address], password [facility] 06/09/2012, 6:50 PM  LOS: 9 days

## 2012-06-09 NOTE — Progress Notes (Signed)
Patient leg tremors have improved.  Exam: PERRL, EOMI, Face symmetric Motor: no drift Sensory: Intact to light touch.  DTR: 3+ and symmetric with non-sustained clonus at the ankles.   Impression: 78 yo F with hyperreflexia of unclear etiology. B12 normal range, but would check MMA as it is in a borderline range. Also will check HIV, copper. vitamin E is pending.   If these are normal, then the differential would include mostly etiologies without specific treatment such as PLS, idiopathic hyperreflexia.   1) HIV, copper, vitamin E, mma 2) If normal, would follow up as outpatient with neruology.  Nicole Slot, MD [facility] [phone]  If 7pm- 7am, please page neurology on call at [phone].

## 2012-06-09 NOTE — Progress Notes (Signed)
ANTICOAGULATION CONSULT NOTE - Follow Up Consult  Pharmacy Consult for Coumadin Indication: Suspected PE  Allergies  Allergen Reactions  . Amlodipine Nausea And Vomiting and Swelling    Swelling --sick and ended up in hospital N&V  . Codeine Nausea And Vomiting  . Lipitor (Atorvastatin Calcium) Other (See Comments)    "cramps my legs"  . Lisinopril Other (See Comments)    unknown  . Red Yeast Rice Other (See Comments)    unknown  . Zetia (Ezetimibe) Other (See Comments)    unknown    Patient Measurements: Height: 5\' 5"  (165.1 cm) Weight: 144 lb 2.9 oz (65.4 kg) IBW/kg (Calculated) : 57  Heparin Dosing Weight:   Vital Signs: Temp: 97.3 F (36.3 C) (11/02 1331) Temp src: Oral (11/02 1331) BP: 193/92 mmHg (11/02 1331) Pulse Rate: 107  (11/02 1331)  Labs:  Basename 06/09/12 0429 06/08/12 0605 06/07/12 0625  HGB 11.9* 10.6* --  HCT 35.7* 32.0* 31.2*  PLT 361 276 245  APTT -- -- --  LABPROT 26.0* 28.7* 33.6*  INR 2.52* 2.88* 3.57*  HEPARINUNFRC -- -- --  CREATININE -- 2.19* 2.21*  CKTOTAL -- -- --  CKMB -- -- --  TROPONINI -- -- --    Estimated Creatinine Clearance: 16.6 ml/min (by C-G formula based on Cr of 2.19).  Assessment: 88y of on Coumadin for suspected PE. Heparin bridge was completed 10/30. INR remains therapeutic at 2.52. INR significantly jumped on 10/31 unexpectedly - unsure of reason: possible drug interactions - antibiotics (Vancomycin and Zosyn now discontinued), Prozac; AST/ALT trending up. Restarted Coumadin cautiously.   - H/H and Plts improved - No significant bleeding reported  Goal of Therapy:  INR 2-3   Plan:  1. Coumadin 1 mg po x 1 today 2. Follow-up AM INR    Doris Cheadle, PharmD Clinical Pharmacist Pager: [phone] Phone: [phone] 06/09/2012 2:36 PM

## 2012-06-10 LAB — CBC
Hemoglobin: 11.8 g/dL — ABNORMAL LOW (ref 12.0–15.0)
Platelets: 379 10*3/uL (ref 150–400)
RBC: 3.86 MIL/uL — ABNORMAL LOW (ref 3.87–5.11)
WBC: 15.7 10*3/uL — ABNORMAL HIGH (ref 4.0–10.5)

## 2012-06-10 LAB — PROTIME-INR: Prothrombin Time: 25.8 seconds — ABNORMAL HIGH (ref 11.6–15.2)

## 2012-06-10 MED ORDER — SERTRALINE HCL 25 MG PO TABS
25.0000 mg | ORAL_TABLET | Freq: Every day | ORAL | Status: DC
  Administered 2012-06-10: 25 mg via ORAL
  Filled 2012-06-10 (×2): qty 1

## 2012-06-10 MED ORDER — DEXTROSE 5 % IV SOLN
1.0000 g | INTRAVENOUS | Status: DC
  Administered 2012-06-10: 1 g via INTRAVENOUS
  Filled 2012-06-10 (×2): qty 10

## 2012-06-10 MED ORDER — WARFARIN SODIUM 1 MG PO TABS
1.0000 mg | ORAL_TABLET | Freq: Once | ORAL | Status: AC
  Administered 2012-06-10: 1 mg via ORAL
  Filled 2012-06-10: qty 1

## 2012-06-10 MED ORDER — ASPIRIN 81 MG PO CHEW
CHEWABLE_TABLET | ORAL | Status: AC
  Administered 2012-06-10: 11:00:00
  Filled 2012-06-10: qty 1

## 2012-06-10 MED ORDER — COLCHICINE 0.6 MG PO TABS
0.6000 mg | ORAL_TABLET | Freq: Every day | ORAL | Status: DC
  Administered 2012-06-10 – 2012-06-11 (×2): 0.6 mg via ORAL
  Filled 2012-06-10 (×2): qty 1

## 2012-06-10 NOTE — Progress Notes (Signed)
Pt. Bladder scanned.  shown in bladder per scan.  Pt states "I don't have to pee."  Will continue to monitor.

## 2012-06-10 NOTE — Progress Notes (Signed)
ANTICOAGULATION CONSULT NOTE - Follow Up Consult  Pharmacy Consult for Coumadin Indication: Suspected PE  Allergies  Allergen Reactions  . Amlodipine Nausea And Vomiting and Swelling    Swelling --sick and ended up in hospital N&V  . Codeine Nausea And Vomiting  . Lipitor (Atorvastatin Calcium) Other (See Comments)    "cramps my legs"  . Lisinopril Other (See Comments)    unknown  . Red Yeast Rice Other (See Comments)    unknown  . Zetia (Ezetimibe) Other (See Comments)    unknown    Patient Measurements: Height: 5\' 5"  (165.1 cm) Weight: 144 lb 2.9 oz (65.4 kg) IBW/kg (Calculated) : 57  Heparin Dosing Weight:   Vital Signs: Temp: 98.6 F (37 C) (11/03 1401) Temp src: Oral (11/03 1401) BP: 113/72 mmHg (11/03 1401) Pulse Rate: 94  (11/03 1401)  Labs:  Basename 06/10/12 0610 06/09/12 0429 06/08/12 0605  HGB 11.8* 11.9* --  HCT 34.5* 35.7* 32.0*  PLT 379 361 276  APTT -- -- --  LABPROT 25.8* 26.0* 28.7*  INR 2.50* 2.52* 2.88*  HEPARINUNFRC -- -- --  CREATININE -- -- 2.19*  CKTOTAL -- -- --  CKMB -- -- --  TROPONINI -- -- --    Estimated Creatinine Clearance: 16.6 ml/min (by C-G formula based on Cr of 2.19).  Assessment: 88y of on Coumadin for suspected PE. Heparin bridge was completed 10/30. INR remains therapeutic at 2.50. INR significantly jumped on 10/31 unexpectedly - unsure of reason: possible drug interactions - antibiotics (Vancomycin and Zosyn now discontinued), Prozac; AST/ALT trending up. Restarted Coumadin cautiously.   - H/H and Plts stable - No significant bleeding reported  Goal of Therapy:  INR 2-3   Plan:  1. Coumadin 1 mg po x 1 today 2. Follow-up AM INR     Doris Cheadle, PharmD Clinical Pharmacist Pager: [phone] Phone: [phone] 06/10/2012 4:07 PM

## 2012-06-10 NOTE — Progress Notes (Signed)
Pt noted to have increased confusion.  States "The walls are falling and I am dizzy" and patient having hallucinations.  Pt complains of worsening dizziness with movement. Pt states there are "dogs in the corner of the room."  Pt wringing hands and fidgiting with wires and appears anxious.  BP 182/92 HR 108 T 97.7 RR 18 and unlabored.  MD notified.  Order received for IV metoprolol and urine culture from night hospitalist. Will continue to monitor patient.

## 2012-06-10 NOTE — Progress Notes (Signed)
[facility] PROGRESS NOTE  Nicole Harding ZHY:[id number] DOB: July 13, 1924 DOA: 05/31/2012 PCP: [name], MD  Assessment/Plan:  *Pulmonary embolism:  VQ scan was interpreted as intermediate acute pulmonary embolism, however, after reviewing with radiology, feel that PE is likely, and patient has recent history of stasis and has been on estrogen therapy.  Her lower extremity duplex was negative and she does not have evidence of right heart dysfunction on ECHO.  - INR at goal, heparin discontinued on 10/25-She has an oxygen requirement which will need to be weaned carefully and closely monitored as an out-patient  Seizure? DDx benign sleep myoclonus, Tremors-Has been told in the past might have had Atypical Parkinson's per [facility] -She has a witnessed episode of shaking 06/08/12-Neurology has seen her and recommended a work-up inclusive of 1.  MRI cervical/Thoracic spine showing no specific Myelopathy 2. Ammonia was 18 -She potentially very well might have parkinson's and may benefit from a repeat trial of Sinemet? Suggest Copper, Vitamin E, Vitamin B12 Appreciate Neruology input -Unclear what other significance to make of Hyperreflexia and clonus-this has not recurred per PT's nurse/husband-since 06/08/2012  Nausea/vomiting/diarrhea:   Initially improved on decreased dose of fluoxetine, then worsened.  GI was consulted.  DDx includes central etiology versus motility disorder versus infection.  Diarrhea less likely related to central process. -  GI consultation:  Appreciate recommendations-signed off 10/31 -  C diff test negative -  Reduce dose of fluoxetine gradually:  Day 7/7 of  Fluoxetine 20mg  dose- this was discontinued 06/10/2012 -  Zofran was discontinued and was reimplemented 06/06/2012 -  Upper GI series showed severe GERD-risks versus benefits of promotility agents will be discussed-continue Reglan 3 times a day by mouth 10/30  Potential sepsis-worked up  numerous times - Blood cultures and urine culture negative 10/29 - CXR neg - Vancomycin and zosyn started on 10/29 [broadening from Rocephin started 10/26] -De-escalated to Doxycycline 100 bid 10/31 -She had an episode of non responsiveness/confusion on the pm of 06/09/12 and is given a one-time IV dose of Rocephin-urine cultures are pending as of 06/10/2012  AKI in setting of CKD (chronic kidney disease) stage 4, GFR 15-29 ml/min+ hyponatremia, c mild Metabolic Acidosis.  May be prerenal from recent vomiting however FeNa is 1.34.  No recent contrasted studies.  Has been voiding regularly. Likely responsible for hyponatremia - Was on Ns 75-d/c'd - Foley to monitor strict I/O overnight -  D/C asap-voiding trial done 10/30 -Repeat labs in the morning-labs seem to have stabilized,  minimize use of vancomycin [discontinued 06/06/12]  Accelerated hypertension:  -  Continue hydralazine 100 mg 3 times a day-given her high blood pressures today will increase this to 200 mg 3 times a day, 06/09/2029 -  Hold labetalol [expect rebound tachycardia]  Mechanical Fall:  Patient endorses balance problems and falls.  - PT evaluation:  Recommended SNF-Patient seems to agree to this today once options presented to her on 06/08/12 -  Up to chair -  Up with assistance -  Fall precautions  Anemia, significant drop in all blood count on 10/26, but stable on 10/27. - Likely dilutational and hemoglobin only mildly depressed.   - Repeat CBC if signs of bleeding.  Polyarthropathy ? Gout versus fracture-get x-ray and reassess-no pain today.  On 11 3 09/15/2011 complained of bilateral joint pains in the metacarpophalangeal joints-she also had seen in her left ankle, so she might have some systemic Arthrotec, ruling against this is her advanced age of 43 and I suspect this  may be more osteoarthritis rather than a systemic arthritide-- I will trial her dose of colchicine 0.6 mg to see if this helps.  Probable  depression Was on Prozac which was D/c-trialed 1x dose Zoloft 25 mg 06/10/12 pm  DIET:  Vegetarian per patient request ACCESS:  PIV IVF:  OFF PROPH:  Therapeutic on Coumadin, Heparin GTT d/c'd  Code Status: Full code Family Communication:  Spoke with patient and husband patient's daughter who is an Charity fundraiser Disposition Plan:  Patient has agreed to go to a skilled nursing facility-   Chart review  Seen by Dr. Patty Sermons in the outpatient setting 01/2012-noted per that history of irritable bowel syndrome and history of chronic areolar deficiency-she had a normal stress test in 2003 by Dr. Shirlee Latch and a remote echocardiogram was unremarkable  Had been on Premarin 0.625 mg daily which was decreased in the outpatient setting to 0.3 mg, given risk of breast cancer?-was counseled regarding this per Dr. Yevonne Pax last note  Multiple episodes of presyncopal symptoms and orthostatic hypotension per Dr. Yevonne Pax notes  ? History of atypical Parkinson's-seen by [facility] neurology in 2011 and start her carbidopa levodopa per discharge summary dated 10/16/2009  h/o complete rotator cuff tendon tear on the left with severe impingement syndrome on the left shoulder operated upon by Dr. Annitta Jersey in 2000  Consultants:  GI  Procedures:  NM VQ scan 10/25  Upper GI series done 10/29 = spontaneous gastric stage her reflux with no stricture, mild tertiary contractions distal esophagus, stomach duodenum unremarkable   Antibiotics:  None given  HPI/Subjective: Well today-mild confusion and ensuant work-up pursued overnight. Back to baseline today No n/v/cp/sob  By mouth intake has improved and seems to be improving. Hasn't been oob or off oxygen since admit  Objective: Filed Vitals:   06/10/12 0030 06/10/12 0257 06/10/12 0450 06/10/12 0500  BP: 156/78  171/87 160/84  Pulse:   88   Temp:   98.5 F (36.9 C)   TempSrc:   Oral   Resp:   18   Height:      Weight:      SpO2:  94% 96%      Intake/Output Summary (Last 24 hours) at 06/10/12 1331 Last data filed at 06/10/12 1310  Gross per 24 hour  Intake    150 ml  Output   1050 ml  Net   -900 ml   Filed Weights   06/01/12 1005  Weight: 65.4 kg (144 lb 2.9 oz)    Exam:   General:  Caucasian female, lying in bed  HEENT:  MMM  Cardiovascular:  Regular rhythm, 2/6 systolic murmur at LSB  Respiratory: CTAB  Abdomen: NABS, soft, nondistended, nontender  MSK:  No LEE, fingers very painful and swollen  Neuro-Hyperreflxic throughout.  No clonus.  No tremors.   Skin: No new bruises  Data Reviewed: Basic Metabolic Panel:  Lab 06/08/12 0865 06/07/12 0625 06/05/12 1611  NA 135 133* 131*  K 3.7 3.9 3.5  CL 102 102 99  CO2 21 18* 19  GLUCOSE 105* 119* 114*  BUN 19 20 23   CREATININE 2.19* 2.21* 2.13*  CALCIUM 8.6 8.6 8.2*  MG -- -- --  PHOS 3.6 -- --   Liver Function Tests:  Lab 06/08/12 0605 06/07/12 0625 06/05/12 1611  AST -- 43* 27  ALT -- 31 15  ALKPHOS -- 53 53  BILITOT -- 0.4 0.1*  PROT -- 5.8* 5.6*  ALBUMIN 2.7* 2.8* 2.8*   No results found for this basename:  LIPASE:5,AMYLASE:5 in the last 168 hours  Lab 06/08/12 1430  AMMONIA 18   CBC:  Lab 06/10/12 0610 06/09/12 0429 06/08/12 0605 06/07/12 0625 06/06/12 0606 06/05/12 1611  WBC 15.7* 9.4 8.0 10.5 11.6* --  NEUTROABS -- -- -- -- -- 5.7  HGB 11.8* 11.9* 10.6* 10.3* 10.5* --  HCT 34.5* 35.7* 32.0* 31.2* 31.3* --  MCV 89.4 90.6 90.1 90.7 90.5 --  PLT 379 361 276 245 252 --   Cardiac Enzymes: No results found for this basename: CKTOTAL:5,CKMB:5,CKMBINDEX:5,TROPONINI:5 in the last 168 hours BNP (last 3 results) No results found for this basename: PROBNP:3 in the last 8760 hours CBG:  Lab 06/05/12 1516  GLUCAP 119*    Recent Results (from the past 240 hour(s))  URINE CULTURE     Status: Normal   Collection Time   05/31/12 10:11 PM      Component Value Range Status Comment   Specimen Description URINE, RANDOM   Final     Special Requests CX ADDED AT 2237   Final    Culture  Setup Time 05/31/2012 23:02   Final    Colony Count 40,000 COLONIES/ML   Final    Culture     Final    Value: Multiple bacterial morphotypes present, none predominant. Suggest appropriate recollection if clinically indicated.   Report Status 06/01/2012 FINAL   Final   URINE CULTURE     Status: Normal   Collection Time   06/02/12  5:22 PM      Component Value Range Status Comment   Specimen Description URINE, CLEAN CATCH   Final    Special Requests NONE   Final    Culture  Setup Time 06/03/2012 02:00   Final    Colony Count 40,000 COLONIES/ML   Final    Culture     Final    Value: LACTOBACILLUS SPECIES     Note: Standardized susceptibility testing for this organism is not available.   Report Status 06/04/2012 FINAL   Final   CLOSTRIDIUM DIFFICILE BY PCR     Status: Normal   Collection Time   06/04/12  8:30 PM      Component Value Range Status Comment   C difficile by pcr NEGATIVE  NEGATIVE Final   CULTURE, BLOOD (ROUTINE X 2)     Status: Normal (Preliminary result)   Collection Time   06/05/12  4:00 PM      Component Value Range Status Comment   Specimen Description BLOOD RIGHT HAND   Final    Special Requests BOTTLES DRAWN AEROBIC ONLY 5CC   Final    Culture  Setup Time 06/05/2012 23:05   Final    Culture     Final    Value:        BLOOD CULTURE RECEIVED NO GROWTH TO DATE CULTURE WILL BE HELD FOR 5 DAYS BEFORE ISSUING A FINAL NEGATIVE REPORT   Report Status PENDING   Incomplete   CULTURE, BLOOD (ROUTINE X 2)     Status: Normal (Preliminary result)   Collection Time   06/05/12  4:06 PM      Component Value Range Status Comment   Specimen Description BLOOD LEFT HAND   Final    Special Requests BOTTLES DRAWN AEROBIC ONLY 10CC   Final    Culture  Setup Time 06/05/2012 23:05   Final    Culture     Final    Value:        BLOOD CULTURE RECEIVED NO GROWTH TO DATE  CULTURE WILL BE HELD FOR 5 DAYS BEFORE ISSUING A FINAL NEGATIVE  REPORT   Report Status PENDING   Incomplete   URINE CULTURE     Status: Normal   Collection Time   06/05/12  5:00 PM      Component Value Range Status Comment   Specimen Description URINE, CATHETERIZED   Final    Special Requests NONE   Final    Culture  Setup Time 06/05/2012 17:57   Final    Colony Count NO GROWTH   Final    Culture NO GROWTH   Final    Report Status 06/06/2012 FINAL   Final      Studies: No results found.  Scheduled Meds:    . [COMPLETED] aspirin      . aspirin EC  81 mg Oral Daily  . cefTRIAXone (ROCEPHIN)  IV  1 g Intravenous Q24H  . colchicine  0.6 mg Oral Daily  . hydrALAZINE  100 mg Oral Q8H  . [COMPLETED] metoprolol  5 mg Intravenous Once  . multivitamin with minerals  1 tablet Oral Daily  . ondansetron  4 mg Oral Q6H  . oxybutynin  5 mg Oral QHS  . pantoprazole  40 mg Oral BID AC  . sodium chloride  3 mL Intravenous Q12H  . [COMPLETED] warfarin  1 mg Oral ONCE-1800  . Warfarin - Pharmacist Dosing Inpatient   Does not apply q1800  . [DISCONTINUED] FLUoxetine  20 mg Oral Daily  . [DISCONTINUED] hydrALAZINE  100 mg Oral TID  . [DISCONTINUED] hydrALAZINE  200 mg Oral TID   Continuous Infusions:    Principal Problem:  *Pulmonary embolism Active Problems:  Irritable bowel syndrome  UTI (urinary tract infection)  Pain in left shoulder   Nausea  CKD (chronic kidney disease) stage 4, GFR 15-29 ml/min  Accelerated hypertension  Fall  Rigors  Cervical myelopathy    Time spent: 30 minutes    Mahala Menghini [facility]  [facility] Pager [phone]. If 8PM-8AM, please contact night-coverage at [email or web address], password [facility] 06/10/2012, 1:31 PM  LOS: 10 days

## 2012-06-10 NOTE — Progress Notes (Signed)
CSW consulted by RN, Vernona Rieger re: questions about SNF process. CSW met with the patient, daughter and friend. CSW provided support, and encouraged the family to continue discussion on their top choices. The patient's top choices are [facility], [facility] and then [facility]. The weekday CSW will follow up with the patient Monday.   Lia Foyer, LCSWA [facility] Clinical Social Worker Contact #: [phone] (weekend)

## 2012-06-11 LAB — CBC
HCT: 34.5 % — ABNORMAL LOW (ref 36.0–46.0)
Hemoglobin: 11.6 g/dL — ABNORMAL LOW (ref 12.0–15.0)
MCH: 30.4 pg (ref 26.0–34.0)
MCHC: 33.6 g/dL (ref 30.0–36.0)
MCV: 90.6 fL (ref 78.0–100.0)

## 2012-06-11 LAB — CULTURE, BLOOD (ROUTINE X 2): Culture: NO GROWTH

## 2012-06-11 LAB — URINE CULTURE
Colony Count: NO GROWTH
Culture: NO GROWTH

## 2012-06-11 MED ORDER — OXYBUTYNIN CHLORIDE ER 5 MG PO TB24: 5.0000 mg | ORAL_TABLET | Freq: Every day | ORAL | Status: DC

## 2012-06-11 MED ORDER — PANTOPRAZOLE SODIUM 40 MG PO TBEC: 40.0000 mg | DELAYED_RELEASE_TABLET | Freq: Two times a day (BID) | ORAL | Status: DC

## 2012-06-11 MED ORDER — COLCHICINE 0.6 MG PO TABS: 0.6000 mg | ORAL_TABLET | Freq: Two times a day (BID) | ORAL | Status: DC

## 2012-06-11 MED ORDER — WARFARIN SODIUM 1 MG PO TABS: 1.0000 mg | ORAL_TABLET | Freq: Once | ORAL | Status: DC

## 2012-06-11 MED ORDER — SERTRALINE HCL 25 MG PO TABS: 25.0000 mg | ORAL_TABLET | Freq: Every day | ORAL | Status: DC

## 2012-06-11 MED ORDER — ONDANSETRON HCL 4 MG PO TABS: 4.0000 mg | ORAL_TABLET | Freq: Four times a day (QID) | ORAL | Status: DC

## 2012-06-11 MED ORDER — CEPHALEXIN 750 MG PO CAPS: 750.0000 mg | ORAL_CAPSULE | Freq: Two times a day (BID) | ORAL | Status: DC

## 2012-06-11 MED ORDER — CEPHALEXIN 750 MG PO CAPS: 750.0000 mg | ORAL_CAPSULE | Freq: Four times a day (QID) | ORAL | Status: DC

## 2012-06-11 MED ORDER — WARFARIN SODIUM 1 MG PO TABS
1.0000 mg | ORAL_TABLET | Freq: Once | ORAL | Status: AC
  Administered 2012-06-11: 1 mg via ORAL
  Filled 2012-06-11: qty 1

## 2012-06-11 MED ORDER — HYDRALAZINE HCL 100 MG PO TABS: 200.0000 mg | ORAL_TABLET | Freq: Three times a day (TID) | ORAL | Status: DC

## 2012-06-11 NOTE — Discharge Summary (Addendum)
Physician Discharge Summary  Nicole Harding ZOX:[id number] DOB: 05-May-1924 DOA: 05/31/2012  PCP: [name], MD  Admit date: 05/31/2012 Discharge date: 06/11/2012  Time spent: 50 minutes  Recommendations for Outpatient Follow-up:  1. Patient will need an INR in about 2-3 days along with complete metabolic panel and a CBC 2. Patient needs outpatient neurology followup-in the hospital patient had a negative HIV panel and a B12 of 365 but will need to followup on vitamin E., HTLV-1-2  antibody, serum copper-she definitely has hyperreflexia and an MRI done during this admission did not confirm any acute myelopathy and cervical or thoracic lumbar spine 3. Please followup on urine culture drawn 06/09/2012-she's been worked up multiple times during this hospital admission for sepsis. And she was ultimately placed on discharge on Keflex 750 twice a day.  4. She will need an indwelling Foley catheter given she did not have the sensation to void after Foley was clamped-she's been placed on oxybutynin. 5. Her estrogen therapy has been discontinued-she presented with an acute pulmonary embolism but this was by VQ scan and intermediate probability. 6. Will need outpatient GI workup 7. Consider nephrology followup given stage IV to 3 CKD 8. Consider geriatric psychiatrist review of patient-she was discontinued off her fluoxetine this admission and started on Zoloft 25 mg  Discharge Diagnoses:  Principal Problem:  *Pulmonary embolism Active Problems:  Irritable bowel syndrome  UTI (urinary tract infection)  Pain in left shoulder   Nausea  CKD (chronic kidney disease) stage 4, GFR 15-29 ml/min  Accelerated hypertension  Fall  Rigors  Cervical myelopathy   Discharge Condition: stable  Diet recommendation: Liberalized diet-please let her eat whatever is comforting to her. She has poor by mouth intake  Filed Weights   06/01/12 1005  Weight: 65.4 kg (144 lb 2.9 oz)    History of  present illness:   *Pulmonary embolism: VQ scan was interpreted as intermediate acute pulmonary embolism, however, after reviewing with radiology, feel that PE is likely, and patient has recent history of stasis and has been on estrogen therapy. Her lower extremity duplex was negative and she does not have evidence of right heart dysfunction on ECHO.  - INR at goal, heparin discontinued on 10/25-She has an oxygen requirement which will need to be weaned carefully and closely monitored as an out-patient -she did qualify for oxygen on numerous occasions with her O2 sats desaturating into the 80s.  Seizure? DDx benign sleep myoclonus, Tremors-Has been told in the past might have had Atypical Parkinson's per [facility]  -She has a witnessed episode of shaking 06/08/12-Neurology has seen her and recommended a work-up inclusive of  1. MRI cervical/Thoracic spine showing no specific Myelopathy 2. Ammonia was 18 -She potentially very well might have parkinson's and may benefit from a repeat trial of Sinemet? Neurology's opinion was to get  Copper, Vitamin E, Vitamin B12 and other lab work up as per above and patient will need close followup with a neurologist. -Unclear what other significance to make of Hyperreflexia and clonus-this has not recurred per PT's nurse/husband-since 06/08/2012   Nausea/vomiting/diarrhea:  Initially improved on decreased dose of fluoxetine, then worsened. GI was consulted. DDx includes central etiology versus motility disorder versus infection. Diarrhea less likely related to central process.  - GI consultation: Appreciate recommendations-signed off 10/31  - C diff test negative  - Reduce dose of fluoxetine gradually: Day 7/7 of Fluoxetine 20mg  dose- this was discontinued 06/10/2012  - Zofran was discontinued and was reimplemented 06/06/2012-consider reimplementation of  the same if needed - Upper GI series showed severe GERD-risks versus benefits of promotility agents  will be discussed-continue Reglan 3 times a day by mouth 10/30   Potential sepsis-worked up numerous times-patient discharged on Keflex-please followup urine cultures - Blood cultures and urine culture negative 10/29  - CXR neg  - Vancomycin and zosyn started on 10/29 [broadening from Rocephin started 10/26]  -De-escalated to Doxycycline 100 bid 10/31 which was subsequently discontinued -She had an episode of non responsiveness/confusion on the pm of 06/09/12 and is given a one-time IV dose of Rocephin-urine cultures are pending as of 06/10/2012 -as the patient had mild leukocytosis without fever, we felt it was reasonable to continue her on Keflex 750 twice a day until culture results were obtained. Please discontinue antibiotics if cultures are negative and adjust antibiotics accordingly if they are positive  AKI in setting of CKD (chronic kidney disease) stage 4, GFR 15-29 ml/min+ hyponatremia, c mild Metabolic Acidosis.  May be prerenal from recent vomiting however FeNa is 1.34. No recent contrasted studies. Has been voiding regularly. Likely responsible for hyponatremia  - Was on Ns 75-d/c'd  -May benefit from nephrology input as an outpatient  Urinary retention -Foley to monitor strict I/O overnight - D/C asap-voiding trial done 10/30, and also 06/10/2012 which she failed. -Foley catheter had to be replaced on discharge 06/11/2012-patient started on oxybutynin 5 mg each bedtime, monitor for agitation altered mental status. As this is an anticholinergic  Accelerated hypertension:  - Continue hydralazine 100 mg 3 times a day-given her high blood pressures today will increase this to 200 mg 3 times a day, 06/09/2029  -Patient started back on low-dose thiazide diuretic -Patient will need potentially to up titrate on hydralazine., Would benefit from basic metabolic panel to check renal status  Mechanical Fall:  Patient endorses balance problems and falls-she had a fall 05/28/2012 and this was  noted at the urgent care - PT evaluation: Recommended SNF-Patient seems to agree to this today once options presented to her on 06/08/12  - Up with assistance  - Fall precautions   Anemia, significant drop in all blood count on 10/26, but stable on 10/27.  - Likely dilutational and hemoglobin only mildly depressed.  - Repeat CBC if signs of bleeding.   Polyarthropathy  ? Gout versus fracture-get x-ray and reassess-no pain today. On 11 3 09/15/2011 complained of bilateral joint pains in the metacarpophalangeal joints-she also had seen in her left ankle, so she might have some systemic Arthrotec, ruling against this is her advanced age of 38 and I suspect this may be more osteoarthritis rather than a systemic arthritide-- I she was discharged on colchicine 0.6 twice a day   Probable depression  Was on Prozac which was D/c-trialed 1x dose Zoloft 25 mg 06/10/12 pm   CHART review Seen by Dr. Patty Sermons in the outpatient setting 01/2012-noted per that history of irritable bowel syndrome and history of chronic areolar deficiency-she had a normal stress test in 2003 by Dr. Shirlee Latch and a remote echocardiogram was unremarkable  Had been on Premarin 0.625 mg daily which was decreased in the outpatient setting to 0.3 mg, given risk of breast cancer?-was counseled regarding this per Dr. Yevonne Pax last note  Multiple episodes of presyncopal symptoms and orthostatic hypotension per Dr. Yevonne Pax notes  ? History of atypical Parkinson's-seen by [facility] neurology in 2011 and start her carbidopa levodopa per discharge summary dated 10/16/2009  h/o complete rotator cuff tendon tear on the left with severe impingement syndrome  on the left shoulder operated upon by Dr. Annitta Jersey in 2000  Consultants:  GI  Procedures:  NM VQ scan 10/25  Upper GI series done 10/29 = spontaneous gastric stage her reflux with no stricture, mild tertiary contractions distal esophagus, stomach duodenum unremarkable   Discharge  Exam: Filed Vitals:   06/10/12 0500 06/10/12 1401 06/10/12 2130 06/11/12 0600  BP: 160/84 113/72 151/73 165/81  Pulse:  94 89 82  Temp:  98.6 F (37 C) 97.5 F (36.4 C) 97.8 F (36.6 C)  TempSrc:  Oral    Resp:  18 14 16   Height:      Weight:      SpO2:  95% 95% 93%   Feeling fair today Not as verbal as prior  General: well. Cardiovascular: s1 s2 no m/r/g Respiratory: NO added sound  Discharge Instructions  Discharge Orders    Future Appointments: Provider: Department: Dept Phone: Center:   07/17/2012 10:30 AM Rosalio Macadamia, NP [facility] Main Office [place]) [phone] [id number]     Future Orders Please Complete By Expires   For home use only DME oxygen      Questions: Responses:   Mode or (Route) Nasal cannula   Liters per Minute 2   Frequency    Oxygen conserving device    Insert,temp indwelling blad cath,simple          Medication List     As of 06/11/2012  4:42 PM    STOP taking these medications         estrogens (conjugated) 0.3 MG tablet   Commonly known as: PREMARIN      FLUoxetine 40 MG capsule   Commonly known as: PROZAC      ICAPS PO      metoprolol succinate 25 MG 24 hr tablet   Commonly known as: TOPROL-XL      SYSTANE OP      TAKE these medications         aspirin 81 MG tablet   Take 81 mg by mouth daily.      cephALEXin 750 MG capsule   Commonly known as: KEFLEX   Take 1 capsule (750 mg total) by mouth 2 (two) times daily.      colchicine 0.6 MG tablet   Take 1 tablet (0.6 mg total) by mouth 2 (two) times daily.      hydrALAZINE 100 MG tablet   Commonly known as: APRESOLINE   Take 2 tablets (200 mg total) by mouth every 8 (eight) hours.      hydrochlorothiazide 25 MG tablet   Commonly known as: HYDRODIURIL   TAKE ONE-HALF (1/2) TABLET DAILY      multivitamin with minerals Tabs   Take 1 tablet by mouth daily.      ondansetron 4 MG tablet   Commonly known as: ZOFRAN   Take 1 tablet (4 mg total) by mouth  every 6 (six) hours.      oxybutynin 5 MG 24 hr tablet   Commonly known as: DITROPAN-XL   Take 1 tablet (5 mg total) by mouth at bedtime.      pantoprazole 40 MG tablet   Commonly known as: PROTONIX   Take 1 tablet (40 mg total) by mouth 2 (two) times daily before a meal.      sertraline 25 MG tablet   Commonly known as: ZOLOFT   Take 1 tablet (25 mg total) by mouth at bedtime.      warfarin 1 MG tablet   Commonly known  as: COUMADIN   Take 1 tablet (1 mg total) by mouth one time only at 6 PM.            Follow-up Information    Follow up with [name], MD. In 1 day.   Contact information:   [address] [place] [address] [phone]           The results of significant diagnostics from this hospitalization (including imaging, microbiology, ancillary and laboratory) are listed below for reference.    Significant Diagnostic Studies: Dg Chest 2 View  05/31/2012  *RADIOLOGY REPORT*  Clinical Data: Chest pain.  CHEST - 2 VIEW  Comparison: 05/31/2012 and 08/15/2005  Findings: Two views of the chest were obtained.  There are scattered calcifications throughout both sides of the chest which are unchanged.  There is stable prominence of a right structure. The right hilar structure is likely vascular in etiology and similar to an exam in 2007.  Heart size is normal.  No evidence for a pneumothorax.  IMPRESSION: Stable chest radiograph findings.  No acute disease.  Evidence for old granulomatous disease.   Original Report Authenticated By: Richarda Overlie, M.D.    Dg Chest 2 View  05/31/2012  *RADIOLOGY REPORT*  Clinical Data: Shortness of breath, sharp pain under the right ribs, no known injury  CHEST - 2 VIEW  Comparison: 12/24/2007  Findings:  Unchanged cardiac silhouette and mediastinal contours with mild tortuosity of the thoracic aorta.  Calcified granulomas are again seen overlying the bilateral upper lungs.  The lungs appear hyperexpanded with flattening  of bilateral hemidiaphragms.  No focal parenchymal opacities.  No definite pleural effusion or pneumothorax.  No definite acute osseous abnormalities.  IMPRESSION: 1.  Hyperexpanded lungs without definite acute cardiopulmonary disease. 2.  Stable sequela of prior granulomatous infection.   Original Report Authenticated By: Waynard Reeds, M.D.    Dg Ribs Unilateral Right  05/31/2012  *RADIOLOGY REPORT*  Clinical Data: Sharp pain under right ribs, no known injury  RIGHT RIBS - 2 VIEW  Comparison: Chest radiograph - earlier same day; 12/24/2007  Findings:  Unchanged cardiac silhouette and mediastinal contours with atherosclerotic calcifications within a mildly tortuous thoracic aorta.  Calcified granuloma overlies the right upper lung.  No definite displaced right-sided rib fractures with special attention paid to the area demarcated by the radiopaque arrow.  IMPRESSION: No displaced right-sided rib fractures with special attention paid to the area demarcated by the radiopaque arrow.   Original Report Authenticated By: Waynard Reeds, M.D.    Dg Hip Complete Left  05/28/2012  *RADIOLOGY REPORT*  Clinical Data: Fall, hip injury  LEFT HIP - COMPLETE 2+ VIEW  Comparison: None.  Findings: Bones are osteopenic.  Degenerative changes of the lower lumbar spine, pelvis, and both hips.  Pelvis and hips appear intact.  No displaced left hip fracture.  Trabecular patterns appear symmetric and intact.  IMPRESSION: Degenerative changes and osteopenia.  No acute osseous finding by plain radiography   Original Report Authenticated By: Judie Petit. Ruel Favors, M.D.    Dg Ankle Complete Left  06/06/2012  *RADIOLOGY REPORT*  Clinical Data: Lateral ankle swelling  LEFT ANKLE COMPLETE - 3+ VIEW  Comparison: None.  Findings: Bones appear osteopenic.  Peripheral vascular calcifications noted.  Normal alignment without fracture.  No effusion noted.  Preserved joint spaces.  IMPRESSION: Osteopenia.  No acute osseous finding.   Original  Report Authenticated By: Judie Petit. Ruel Favors, M.D.    Mr Cervical Spine  Wo Contrast  06/08/2012  *RADIOLOGY REPORT*  Clinical Data:  Elderly patient with multiple falls.  Cervical thoracic myelopathy.  MRI CERVICAL AND THORACIC SPINE WITHOUT CONTRAST  Technique:  Multiplanar and multiecho pulse sequences of the cervical spine, to include the craniocervical junction and cervicothoracic junction, and the thoracic spine, were obtained without intravenous contrast.  Comparison:  Chest radiographs 05/31/2012.  MRI CERVICAL SPINE  Findings:  Study is mildly motion degraded.  The cervical alignment is normal.  There is no evidence of acute fracture or acute paraspinal abnormality.  There is a 6 mm left para esophageal nodule at C6-C7 which is nonspecific.  This may reflect a lymph node.  Atrophy is noted in the posterior fossa.  There is a small lacunar infarct within the pons.  The cervical cord is normal in signal and caliber.  Bilateral vertebral artery flow voids are noted.  Mild pannus formation is noted surrounding the odontoid process. There is no osseous erosion. At C2-C3, there is asymmetric facet hypertrophy on the right with mild resulting right foraminal stenosis.  C3-C4: High signal within the disc on T1 and T2-weighted images is probably due to auto fusion with posterior osteophytes.  The facet joints also appear fused.  Uncinate spurring contributes to moderate right greater than left foraminal stenosis.  C4-C5:  Uncinate spurring and bilateral facet hypertrophy contribute to moderate right and mild left foraminal stenosis.  C5-C6:  There is possible fusion across the right facet joint. There is mild uncinate spurring.  No significant spinal stenosis or nerve root encroachment.  C6-C7:  There are prominent anterior osteophytes and bilateral facet hypertrophy.  No significant spinal stenosis or nerve root encroachment.  C7-T1:  Bilateral facet hypertrophy.  No spinal stenosis or nerve root encroachment.   IMPRESSION:  1.  No cervical cord compression or evidence of cervical myelopathy. 2.  Multilevel spondylosis with uncinate spurring and facet disease as described.  There is probable auto fusion at C3-C4.  The right C5-C6 facet joint may be fused as well. There is mild to moderate foraminal stenosis throughout the cervical spine as detailed above. 3.  No acute osseous findings or malalignment.  MRI THORACIC SPINE  Findings: The thoracic alignment is normal.  There is no evidence of acute fracture or paraspinal abnormality.  There are paraspinal osteophytes throughout the thoracic spine.  The thoracic cord is normal in signal and caliber.  The conus medullaris extends to the L1 level.  There are scattered small thoracic disc protrusions.  There is no cord deformity, foraminal compromise or nerve root encroachment. Small bilateral pleural effusions are noted incidentally.  IMPRESSION:  1.  Minimal thoracic spondylosis.  No significant spinal stenosis or nerve root encroachment. 2.  No evidence of thoracic myelopathy. 3.  No acute osseous findings.   Original Report Authenticated By: Carey Bullocks, M.D.    Mr Thoracic Spine Wo Contrast  06/08/2012  *RADIOLOGY REPORT*  Clinical Data:  Elderly patient with multiple falls.  Cervical thoracic myelopathy.  MRI CERVICAL AND THORACIC SPINE WITHOUT CONTRAST  Technique:  Multiplanar and multiecho pulse sequences of the cervical spine, to include the craniocervical junction and cervicothoracic junction, and the thoracic spine, were obtained without intravenous contrast.  Comparison:  Chest radiographs 05/31/2012.  MRI CERVICAL SPINE  Findings:  Study is mildly motion degraded.  The cervical alignment is normal.  There is no evidence of acute fracture or acute paraspinal abnormality.  There is a 6 mm left para esophageal nodule at C6-C7 which is nonspecific.  This may reflect a lymph node.  Atrophy is noted in the posterior fossa.  There is a small lacunar infarct within the  pons.  The cervical cord is normal in signal and caliber.  Bilateral vertebral artery flow voids are noted.  Mild pannus formation is noted surrounding the odontoid process. There is no osseous erosion. At C2-C3, there is asymmetric facet hypertrophy on the right with mild resulting right foraminal stenosis.  C3-C4: High signal within the disc on T1 and T2-weighted images is probably due to auto fusion with posterior osteophytes.  The facet joints also appear fused.  Uncinate spurring contributes to moderate right greater than left foraminal stenosis.  C4-C5:  Uncinate spurring and bilateral facet hypertrophy contribute to moderate right and mild left foraminal stenosis.  C5-C6:  There is possible fusion across the right facet joint. There is mild uncinate spurring.  No significant spinal stenosis or nerve root encroachment.  C6-C7:  There are prominent anterior osteophytes and bilateral facet hypertrophy.  No significant spinal stenosis or nerve root encroachment.  C7-T1:  Bilateral facet hypertrophy.  No spinal stenosis or nerve root encroachment.  IMPRESSION:  1.  No cervical cord compression or evidence of cervical myelopathy. 2.  Multilevel spondylosis with uncinate spurring and facet disease as described.  There is probable auto fusion at C3-C4.  The right C5-C6 facet joint may be fused as well. There is mild to moderate foraminal stenosis throughout the cervical spine as detailed above. 3.  No acute osseous findings or malalignment.  MRI THORACIC SPINE  Findings: The thoracic alignment is normal.  There is no evidence of acute fracture or paraspinal abnormality.  There are paraspinal osteophytes throughout the thoracic spine.  The thoracic cord is normal in signal and caliber.  The conus medullaris extends to the L1 level.  There are scattered small thoracic disc protrusions.  There is no cord deformity, foraminal compromise or nerve root encroachment. Small bilateral pleural effusions are noted incidentally.   IMPRESSION:  1.  Minimal thoracic spondylosis.  No significant spinal stenosis or nerve root encroachment. 2.  No evidence of thoracic myelopathy. 3.  No acute osseous findings.   Original Report Authenticated By: Carey Bullocks, M.D.    US Renal  06/05/2012  *RADIOLOGY REPORT*  Clinical Data: Acute renal insufficiency  RENAL/URINARY TRACT ULTRASOUND COMPLETE  Comparison:  05/19/2006  Findings:  Right Kidney:  8.2 cm in length.  No hydronephrosis or focal lesion.  Isoechoic or slightly echogenic compared to the adjacent liver.  Left Kidney:  10.2 cm in length.  No focal lesion or hydronephrosis.  Bladder:  Decompressed by Foley catheter  IMPRESSION:  1.  No hydronephrosis. 2.  Echogenic renal parenchyma, a nonspecific indicator of medical renal disease. 3.  Asymmetric kidney sizes, left greater than right, suggesting right renal artery stenosis or previous parenchymal insult.   Original Report Authenticated By: Thora Lance III, M.D.    Nm Pulmonary Perf And Vent  06/01/2012  *RADIOLOGY REPORT*  Clinical Data: Shortness of breath and chest pain on the right. The patient status post fall 1 week ago with right rib trauma.  NM PULMONARY VENTILATION AND PERFUSION SCAN  Radiopharmaceutical: CURIE MAA TECHNETIUM TO 68M ALBUMIN AGGREGATED 38 mCi technetium 45m DTPA for ventilation  Comparison: Plain film chest 05/31/2012.  Findings: Multiple peripheral wedge shaped defects are seen on perfusion imaging.  Most of these are matched although matching appears incomplete in some locations such as the right middle lobe.  IMPRESSION: Intermediate probability for  pulmonary embolus.   Original Report Authenticated By: Bernadene Bell. Maricela Curet, M.D.    Dg Chest Port 1 View  06/05/2012  *RADIOLOGY REPORT*  Clinical Data: Hypotension, rigors,  PORTABLE CHEST - 1 VIEW  Comparison: 05/31/2012.  Findings: Lung volumes are low on this examination.  The lungs are clear.  No consolidation or edema.  There are no  effusions or pneumothoraces.  The heart, mediastinal and hilar contours are normal with note made again of prominent central pulmonary arteries.  There are no acute bony abnormalities.  IMPRESSION: No active disease.   Original Report Authenticated By: Mervin Hack, M.D.    Dg Shoulder Left  05/28/2012  *RADIOLOGY REPORT*  Clinical Data: Contusion after fall.  LEFT SHOULDER - 2+ VIEW  Comparison: None.  Findings: No fractures or dislocations.  Mild degenerative changes in the acromioclavicular joint.  The glenohumeral joint is normally preserved.  Small coarse calcification in the rotator interval. Contiguous ribs are normal.  IMPRESSION: No acute skeletal trauma.   Original Report Authenticated By: Mervin Hack, M.D.    Varney Biles Kayleen Memos Hans Eden  06/05/2012  *RADIOLOGY REPORT*  Clinical Data:  Nausea, vomiting  UPPER GI SERIES WITH KUB  Technique:  Routine upper GI series was performed with thin barium  Fluoroscopy Time: 1.30 minutes  Comparison:  None.  Findings: A preliminary film of the abdomen shows a nonspecific bowel gas pattern.  There are degenerative changes in the lower lumbar spine.  Initially rapid sequence spot films of the cervical esophagus were performed.  The swallowing mechanism is unremarkable.  There does appear to be some prominence of the cricopharyngeus muscle.  Only mild tertiary contractions are present in the mid and of the esophagus.  The stomach is normal in contour and peristalsis.  The duodenal bulb fills and the duodenal loop is in normal position.  There is spontaneous gastroesophageal reflux present.  IMPRESSION:  1.  Spontaneous gastroesophageal reflux.  No stricture. 2.  Mild tertiary contractions in the distal esophagus. 3.  The stomach and duodenum are unremarkable on this single contrast study.   Original Report Authenticated By: Juline Patch, M.D.     Microbiology: Recent Results (from the past 240 hour(s))  URINE CULTURE     Status: Normal   Collection Time    06/02/12  5:22 PM      Component Value Range Status Comment   Specimen Description URINE, CLEAN CATCH   Final    Special Requests NONE   Final    Culture  Setup Time 06/03/2012 02:00   Final    Colony Count 40,000 COLONIES/ML   Final    Culture     Final    Value: LACTOBACILLUS SPECIES     Note: Standardized susceptibility testing for this organism is not available.   Report Status 06/04/2012 FINAL   Final   CLOSTRIDIUM DIFFICILE BY PCR     Status: Normal   Collection Time   06/04/12  8:30 PM      Component Value Range Status Comment   C difficile by pcr NEGATIVE  NEGATIVE Final   CULTURE, BLOOD (ROUTINE X 2)     Status: Normal   Collection Time   06/05/12  4:00 PM      Component Value Range Status Comment   Specimen Description BLOOD RIGHT HAND   Final    Special Requests BOTTLES DRAWN AEROBIC ONLY 5CC   Final    Culture  Setup Time 06/05/2012 23:05   Final    Culture  NO GROWTH 5 DAYS   Final    Report Status 06/11/2012 FINAL   Final   CULTURE, BLOOD (ROUTINE X 2)     Status: Normal   Collection Time   06/05/12  4:06 PM      Component Value Range Status Comment   Specimen Description BLOOD LEFT HAND   Final    Special Requests BOTTLES DRAWN AEROBIC ONLY 10CC   Final    Culture  Setup Time 06/05/2012 23:05   Final    Culture NO GROWTH 5 DAYS   Final    Report Status 06/11/2012 FINAL   Final   URINE CULTURE     Status: Normal   Collection Time   06/05/12  5:00 PM      Component Value Range Status Comment   Specimen Description URINE, CATHETERIZED   Final    Special Requests NONE   Final    Culture  Setup Time 06/05/2012 17:57   Final    Colony Count NO GROWTH   Final    Culture NO GROWTH   Final    Report Status 06/06/2012 FINAL   Final   URINE CULTURE     Status: Normal   Collection Time   06/09/12 11:13 PM      Component Value Range Status Comment   Specimen Description URINE, CATHETERIZED   Final    Special Requests NONE   Final    Culture  Setup Time 06/10/2012  15:59   Final    Colony Count NO GROWTH   Final    Culture NO GROWTH   Final    Report Status 06/11/2012 FINAL   Final      Labs: Basic Metabolic Panel:  Lab 06/08/12 1610 06/07/12 0625 06/05/12 1611  NA 135 133* 131*  K 3.7 3.9 3.5  CL 102 102 99  CO2 21 18* 19  GLUCOSE 105* 119* 114*  BUN 19 20 23   CREATININE 2.19* 2.21* 2.13*  CALCIUM 8.6 8.6 8.2*  MG -- -- --  PHOS 3.6 -- --   Liver Function Tests:  Lab 06/08/12 0605 06/07/12 0625 06/05/12 1611  AST -- 43* 27  ALT -- 31 15  ALKPHOS -- 53 53  BILITOT -- 0.4 0.1*  PROT -- 5.8* 5.6*  ALBUMIN 2.7* 2.8* 2.8*   No results found for this basename: LIPASE:5,AMYLASE:5 in the last 168 hours  Lab 06/08/12 1430  AMMONIA 18   CBC:  Lab 06/11/12 0620 06/10/12 0610 06/09/12 0429 06/08/12 0605 06/07/12 0625 06/05/12 1611  WBC 10.9* 15.7* 9.4 8.0 10.5 --  NEUTROABS -- -- -- -- -- 5.7  HGB 11.6* 11.8* 11.9* 10.6* 10.3* --  HCT 34.5* 34.5* 35.7* 32.0* 31.2* --  MCV 90.6 89.4 90.6 90.1 90.7 --  PLT 363 379 361 276 245 --   Cardiac Enzymes: No results found for this basename: CKTOTAL:5,CKMB:5,CKMBINDEX:5,TROPONINI:5 in the last 168 hours BNP: BNP (last 3 results) No results found for this basename: PROBNP:3 in the last 8760 hours CBG:  Lab 06/05/12 1516  GLUCAP 119*       SignedRhetta Mura  [facility] 06/11/2012, 10:45 AM

## 2012-06-11 NOTE — Progress Notes (Signed)
ANTICOAGULATION CONSULT NOTE - Follow Up Consult  Pharmacy Consult for Coumadin Indication: Suspected PE  Allergies  Allergen Reactions  . Amlodipine Nausea And Vomiting and Swelling    Swelling --sick and ended up in hospital N&V  . Codeine Nausea And Vomiting  . Lipitor (Atorvastatin Calcium) Other (See Comments)    "cramps my legs"  . Lisinopril Other (See Comments)    unknown  . Red Yeast Rice Other (See Comments)    unknown  . Zetia (Ezetimibe) Other (See Comments)    unknown   Labs:  Basename 06/11/12 0620 06/10/12 0610 06/09/12 0429  HGB 11.6* 11.8* --  HCT 34.5* 34.5* 35.7*  PLT 363 379 361  APTT -- -- --  LABPROT 25.1* 25.8* 26.0*  INR 2.41* 2.50* 2.52*  HEPARINUNFRC -- -- --  CREATININE -- -- --  CKTOTAL -- -- --  CKMB -- -- --  TROPONINI -- -- --    Estimated Creatinine Clearance: 16.6 ml/min (by C-G formula based on Cr of 2.19).  Assessment: 88y of on Coumadin for suspected PE. Heparin bridge was completed 10/30. INR remains therapeutic at 2.41.  INR significantly jumped on 10/31 unexpectedly - unsure of reason: possible drug interactions - antibiotics (Vancomycin and Zosyn now discontinued), Prozac; AST/ALT trending up. Restarted Coumadin cautiously.   - H/H and Plts stable - No significant bleeding reported  Goal of Therapy:  INR 2-3   Plan:  1. Coumadin 1 mg po x 1 today 2. If to SNF, recommend discharge on Coumadin 1 mg daily with follow up INR Wednesday or Thursday 2. Follow-up AM INR   Thank you. Okey Regal, PharmD [phone]  06/11/2012 10:00 AM

## 2012-06-11 NOTE — Progress Notes (Signed)
Pt discharged to [facility] per MD order.   Pt alert and oriented at discharge.  Pt and family received and reviewed all discharge instructions and medication information, including follow-up appointments and prescriptions.  Pt discharged with foley catheter in place per MD order. Coumadin 1mg  given per MD prior to discharge.  Report give to [facility], RN at [facility]. Pt transported to SNF via [facility].  Efraim Kaufmann

## 2012-06-11 NOTE — Clinical Social Work Placement (Signed)
     Clinical Social Work Department CLINICAL SOCIAL WORK PLACEMENT NOTE 06/11/2012  Patient:  Nicole Harding, Nicole Harding  Account Number:  [id number] Admit date:  05/31/2012  Clinical Social Worker:  Margaree Mackintosh  Date/time:  06/11/2012 12:00 M  Clinical Social Work is seeking post-discharge placement for this patient at the following level of care:   SKILLED NURSING   (*CSW will update this form in Epic as items are completed)   06/08/2012  Patient/family provided with [facility] Department of Clinical Social Works list of facilities offering this level of care within the geographic area requested by the patient (or if unable, by the patients family).  06/08/2012  Patient/family informed of their freedom to choose among providers that offer the needed level of care, that participate in Medicare, Medicaid or managed care program needed by the patient, have an available bed and are willing to accept the patient.  06/08/2012  Patient/family informed of [facility] ownership interest in [facility], as well as of the fact that they are under no obligation to receive care at this facility.  PASARR submitted to EDS on 06/08/2012 PASARR number received from EDS on 06/08/2012  FL2 transmitted to all facilities in geographic area requested by pt/family on  06/08/2012 FL2 transmitted to all facilities within larger geographic area on   Patient informed that his/her managed care company has contracts with or will negotiate with  certain facilities, including the following:     Patient/family informed of bed offers received:  06/11/2012 Patient chooses bed at  Physician recommends and patient chooses bed at    Patient to be transferred to  on   Patient to be transferred to facility by   The following physician request were entered in Epic:   Additional Comments:

## 2012-06-11 NOTE — Progress Notes (Signed)
Physical Therapy Treatment Patient Details Name: Nicole Harding MRN: [id number] DOB: Jun 16, 1924 Today's Date: 06/11/2012 Time: 4098-1191 PT Time Calculation (min): 24 min  PT Assessment / Plan / Recommendation Comments on Treatment Session  Notable improvements with mobility and gait, but balance issues and decr activity tolerance remain    Follow Up Recommendations  Post acute inpatient     Does the patient have the potential to tolerate intense rehabilitation  No, Recommend SNF  Barriers to Discharge        Equipment Recommendations  None recommended by PT    Recommendations for Other Services    Frequency     Plan Discharge plan remains appropriate;Frequency remains appropriate    Precautions / Restrictions Precautions Precautions: Fall   Pertinent Vitals/Pain     Mobility  Bed Mobility Bed Mobility: Supine to Sit;Sitting - Scoot to Edge of Bed Supine to Sit: 3: Mod assist Sitting - Scoot to Edge of Bed: 4: Min assist Details for Bed Mobility Assistance: vc/tc's for hand placment; truncal assist to come forward Transfers Transfers: Sit to Stand;Stand to Sit Sit to Stand: 4: Min assist;3: Mod assist;From bed;From toilet (mod from lower toilet) Stand to Sit: 4: Min assist;To chair/3-in-1;To toilet Details for Transfer Assistance: Vc for hand placement; truncal assist to come forward over BOS, control for descent to help pt squat to meet the seat. Ambulation/Gait Ambulation/Gait Assistance: 4: Min guard Ambulation Distance (Feet): 65 Feet Assistive device: Rolling walker Ambulation/Gait Assistance Details: frequent vc/tc to correct for posture; minimal stability assist and help to [employer] RW Gait Pattern: Step-through pattern;Decreased step length - left;Decreased step length - right;Decreased stride length;Trunk flexed Gait velocity: decreased Stairs: No Wheelchair Mobility Wheelchair Mobility: No    Exercises     PT Diagnosis:    PT Problem List:   PT  Treatment Interventions:     PT Goals Acute Rehab PT Goals Time For Goal Achievement: 06/11/12 Potential to Achieve Goals: Good PT Goal: Sit to Stand - Progress: Progressing toward goal PT Goal: Ambulate - Progress: Progressing toward goal  Visit Information  Last PT Received On: 06/11/12 Assistance Needed: +1    Subjective Data  Subjective: Yes, I want to try and walk   Cognition  Overall Cognitive Status: History of cognitive impairments - at baseline Arousal/Alertness: Awake/alert Orientation Level: Appears intact for tasks assessed Behavior During Session: [facility] for tasks performed    Balance  Dynamic Standing Balance Dynamic Standing - Balance Support: During functional activity;Bilateral upper extremity supported Dynamic Standing - Level of Assistance: 4: Min assist;Other (comment) (tendency to list posteriorly)  End of Session PT - End of Session Activity Tolerance: Patient tolerated treatment well Patient left: in chair;with call bell/phone within reach;with family/visitor present;with nursing in room Nurse Communication: Mobility status   GP     Meher Kucinski, Eliseo Gum 06/11/2012, 5:59 PM  06/11/2012  Falls Bing, PT [phone] (pager)

## 2012-06-11 NOTE — Progress Notes (Signed)
Pt still unable to void after catheter removal.  Pt. States "I don't have to pee."  Pt bladder scanned.  urine retained per bladder scanner.  Pt. In and Out catheterized per policy for post removal algorithm using aseptic technique.  Patient tolerated well.  urine drained.  Will continue to monitor patient.

## 2012-06-11 NOTE — Progress Notes (Signed)
Pt still without voiding after foley removed.  Pt. Bladder scanned with of urine resulted.  Pt. Denies any discomfort and states "I don't feel like I have to pee." Will continue to monitor.

## 2012-06-12 LAB — VITAMIN E: Gamma-Tocopherol (Vit E): 0.7 mg/L (ref <=4.3)

## 2012-06-14 LAB — HTLV I+II ANTIBODIES, (EIA), BLD

## 2012-06-16 ENCOUNTER — Emergency Department (HOSPITAL_COMMUNITY): Payer: Medicare Other

## 2012-06-16 ENCOUNTER — Emergency Department (HOSPITAL_COMMUNITY)
Admission: EM | Admit: 2012-06-16 | Discharge: 2012-06-16 | Disposition: A | Discharge status: Skilled Nursing Facility | Payer: Medicare Other | Source: Home / Self Care | Attending: Emergency Medicine | Admitting: Emergency Medicine

## 2012-06-16 ENCOUNTER — Encounter (HOSPITAL_COMMUNITY): Payer: Self-pay | Admitting: Emergency Medicine

## 2012-06-16 DIAGNOSIS — N390 Urinary tract infection, site not specified: Secondary | ICD-10-CM

## 2012-06-16 DIAGNOSIS — W19XXXA Unspecified fall, initial encounter: Secondary | ICD-10-CM

## 2012-06-16 DIAGNOSIS — S0181XA Laceration without foreign body of other part of head, initial encounter: Secondary | ICD-10-CM

## 2012-06-16 LAB — PROTIME-INR
INR: 1.94 — ABNORMAL HIGH (ref 0.00–1.49)
Prothrombin Time: 21.4 seconds — ABNORMAL HIGH (ref 11.6–15.2)

## 2012-06-16 LAB — CBC WITH DIFFERENTIAL/PLATELET
Basophils Absolute: 0 10*3/uL (ref 0.0–0.1)
Eosinophils Relative: 1 % (ref 0–5)
HCT: 32.4 % — ABNORMAL LOW (ref 36.0–46.0)
Hemoglobin: 10.8 g/dL — ABNORMAL LOW (ref 12.0–15.0)
Lymphocytes Relative: 8 % — ABNORMAL LOW (ref 12–46)
MCV: 89.8 fL (ref 78.0–100.0)
Monocytes Absolute: 0.8 10*3/uL (ref 0.1–1.0)
Monocytes Relative: 7 % (ref 3–12)
Neutro Abs: 9.5 10*3/uL — ABNORMAL HIGH (ref 1.7–7.7)
RDW: 13.1 % (ref 11.5–15.5)
WBC: 11.3 10*3/uL — ABNORMAL HIGH (ref 4.0–10.5)

## 2012-06-16 LAB — COMPREHENSIVE METABOLIC PANEL
BUN: 42 mg/dL — ABNORMAL HIGH (ref 6–23)
CO2: 23 mEq/L (ref 19–32)
Calcium: 8.4 mg/dL (ref 8.4–10.5)
Chloride: 92 mEq/L — ABNORMAL LOW (ref 96–112)
Creatinine, Ser: 2.09 mg/dL — ABNORMAL HIGH (ref 0.50–1.10)
GFR calc Af Amer: 24 mL/min — ABNORMAL LOW (ref >90)
GFR calc non Af Amer: 20 mL/min — ABNORMAL LOW (ref >90)
Glucose, Bld: 96 mg/dL (ref 70–99)
Total Bilirubin: 0.4 mg/dL (ref 0.3–1.2)

## 2012-06-16 LAB — URINALYSIS, ROUTINE W REFLEX MICROSCOPIC
Glucose, UA: NEGATIVE mg/dL
Nitrite: NEGATIVE
Protein, ur: NEGATIVE mg/dL
pH: 6 (ref 5.0–8.0)

## 2012-06-16 LAB — ABO/RH: ABO/RH(D): A POS

## 2012-06-16 LAB — TYPE AND SCREEN: ABO/RH(D): A POS

## 2012-06-16 LAB — URINE MICROSCOPIC-ADD ON

## 2012-06-16 MED ORDER — DEXTROSE 5 % IV SOLN: 1.0000 g | INTRAVENOUS | Status: DC

## 2012-06-16 MED ORDER — LIDOCAINE-EPINEPHRINE (PF) 2 %-1:200000 IJ SOLN
10.0000 mL | Freq: Once | INTRAMUSCULAR | Status: AC
  Administered 2012-06-16: 10 mL
  Filled 2012-06-16: qty 10

## 2012-06-16 MED ORDER — POTASSIUM CHLORIDE 20 MEQ/15ML (10%) PO LIQD
40.0000 meq | Freq: Once | ORAL | Status: AC
  Administered 2012-06-16: 40 meq via ORAL
  Filled 2012-06-16: qty 30

## 2012-06-16 MED ORDER — CEPHALEXIN 500 MG PO CAPS: 500.0000 mg | ORAL_CAPSULE | Freq: Four times a day (QID) | ORAL | Status: DC

## 2012-06-16 MED ORDER — DEXTROSE 5 % IV SOLN
1.0000 g | Freq: Once | INTRAVENOUS | Status: AC
  Administered 2012-06-16: 1 g via INTRAVENOUS
  Filled 2012-06-16: qty 10

## 2012-06-16 MED ORDER — POTASSIUM CHLORIDE CRYS ER 20 MEQ PO TBCR: 40.0000 meq | EXTENDED_RELEASE_TABLET | Freq: Once | ORAL | Status: DC

## 2012-06-16 NOTE — ED Provider Notes (Signed)
Patient seen by Dr Freida Busman.  I was asked to repair forehead wound and examine right thumb wound.    LACERATION REPAIR Performed by: Trixie Dredge B Authorized by: Trixie Dredge B Consent: Verbal consent obtained. Risks and benefits: risks, benefits and alternatives were discussed Consent given by: patient Patient identity confirmed: provided demographic data Prepped and Draped in normal sterile fashion Wound explored  Laceration Location: left forehead  Laceration Length: 6 cm, irregular with avulsed/deeply abraded segment at lateral edge  No Foreign Bodies seen or palpated  Anesthesia: local infiltration  Local anesthetic: lidocaine 2% with epinephrine  Anesthetic total: 8 ml  Irrigation method: syringe Amount of cleaning: standard  Skin closure: 5-0 ethilon and quick clot pad  Number of sutures: 6  Technique: simple interrupted  Patient tolerance: Patient tolerated the procedure well with no immediate complications.   Wound REPAIR Performed by: Trixie Dredge B Authorized by: Trixie Dredge B Consent: Verbal consent obtained. Risks and benefits: risks, benefits and alternatives were discussed Consent given by: patient Patient identity confirmed: provided demographic data Prepped and Draped in normal sterile fashion Wound explored  Laceration Location: right thumb  Laceration Length: 0cm, thumb is abraded with skin tear and ecchymosis  No Foreign Bodies seen or palpated  Anesthesia: digital block  Local anesthetic: lidocaine 2% no epinephrine  Anesthetic total: 4.5 ml  Irrigation method: lavage, gentle skin scrub Amount of cleaning: standard  Skin closure: none  Number of sutures: none   Technique: n/a  Patient tolerance: Patient tolerated the procedure well with no immediate complications.  Dr Freida Busman notified of thumb exam.   Wound care ordered by me - bacitracin and bandage to be applied.    [place], [place] 06/16/12 1401

## 2012-06-16 NOTE — ED Notes (Addendum)
Pt from [facility]. Fell from bed this AM, laceration to front left forehead. Pt on 2L O2 [place] for recent hx of PE. Bleeding to head controlled at this time. CBG 103 by EMS

## 2012-06-16 NOTE — ED Notes (Signed)
kristina nurse at facility states they are going to place bed alarm for pt to prevent falls and watch her in dining hall during day for safety, family made aware

## 2012-06-16 NOTE — ED Notes (Signed)
Patient transported to X-ray 

## 2012-06-16 NOTE — ED Notes (Signed)
NAD noted at time of d/c to [facility].

## 2012-06-16 NOTE — ED Notes (Signed)
This EMT and Sherita, EMT cleaned pt and changed bed sheets

## 2012-06-16 NOTE — ED Notes (Signed)
Suture cart to bedside. 

## 2012-06-16 NOTE — ED Notes (Signed)
Quick clot 4x4 to bedside for E west edpa to apply to forehead lac

## 2012-06-16 NOTE — ED Provider Notes (Addendum)
History     CSN: [id number]  Arrival date & time 06/16/12  2952   First MD Initiated Contact with Patient 06/16/12 0805      Chief Complaint  Patient presents with  . Head Laceration    (Consider location/radiation/quality/duration/timing/severity/associated sxs/prior treatment) Patient is a 78 y.o. female presenting with scalp laceration. The history is provided by the patient.  Head Laceration   patient here after sustaining an unwitnessed fall the nursing home. Unknown loss of consciousness. Has been having trouble with her gait recently with increased falls. Denies any abdominal or chest pain. Denies pain to the left for head where she has a laceration to control bleeding. She is on Coumadin. She also notes pain to her right thumb and left knee. States she was getting up to go to the bathroom lost her balance and fell. Denies any neck pain or upper or lower extremity paresthesias. Patient transported here by EMS  Past Medical History  Diagnosis Date  . Hypertension   . History of orthostatic hypotension   . IBS (irritable bowel syndrome)   . Parkinson's disease     ATYPICAL  . Dizziness   . Vertigo   . Fatigue   . Advanced age   . HLD (hyperlipidemia)     intolerant to lipid lowering drugs  . Normal nuclear stress test 2003  . Fall at home 05/2012    mechanical fall  (06/01/2012)  . Pulmonary embolism 05/31/2012  . Chronic kidney disease, stage 4 (severe)   . Exertional dyspnea   . Arthritis     "legs, fingers" (06/01/2012)    Past Surgical History  Procedure Date  . Small intestine surgery   . Hemorrhoid surgery 1970    "have4 had accidents ever since" (06/01/2012)  . Abdominal hysterectomy 1970's  . Tonsillectomy 1954  . Cholecystectomy 1955?  Marland Kitchen Breast cyst excision 1970's    left  . Cataract extraction w/ intraocular lens  implant, bilateral ? 1990's  . Shoulder open rotator cuff repair 1990?    right    Family History  Problem Relation Age of Onset    . Stroke Father     History  Substance Use Topics  . Smoking status: Former Smoker -- 1.0 packs/day for 20 years    Types: Cigarettes    Quit date: 02/14/1971  . Smokeless tobacco: Never Used  . Alcohol Use: No    OB History    Grav Para Term Preterm Abortions TAB SAB Ect Mult Living                  Review of Systems  All other systems reviewed and are negative.    Allergies  Amlodipine; Codeine; Lipitor; Lisinopril; Red yeast rice; and Zetia  Home Medications   Current Outpatient Rx  Name  Route  Sig  Dispense  Refill  . ASPIRIN 81 MG PO TABS   Oral   Take 81 mg by mouth daily.         . CEPHALEXIN 750 MG PO CAPS   Oral   Take 1 capsule (750 mg total) by mouth 2 (two) times daily.   7 capsule   0   . COLCHICINE 0.6 MG PO TABS   Oral   Take 1 tablet (0.6 mg total) by mouth 2 (two) times daily.         Marland Kitchen HYDRALAZINE HCL 100 MG PO TABS   Oral   Take 2 tablets (200 mg total) by mouth every 8 (eight) hours.         Marland Kitchen  HYDROCHLOROTHIAZIDE 25 MG PO TABS      TAKE ONE-HALF (1/2) TABLET DAILY   45 tablet   3   . ADULT MULTIVITAMIN W/MINERALS CH   Oral   Take 1 tablet by mouth daily.         Marland Kitchen ONDANSETRON HCL 4 MG PO TABS   Oral   Take 1 tablet (4 mg total) by mouth every 6 (six) hours.   20 tablet      . OXYBUTYNIN CHLORIDE ER 5 MG PO TB24   Oral   Take 1 tablet (5 mg total) by mouth at bedtime.   30 tablet   0   . PANTOPRAZOLE SODIUM 40 MG PO TBEC   Oral   Take 1 tablet (40 mg total) by mouth 2 (two) times daily before a meal.         . SERTRALINE HCL 25 MG PO TABS   Oral   Take 1 tablet (25 mg total) by mouth at bedtime.   30 tablet      . WARFARIN SODIUM 1 MG PO TABS   Oral   Take 1 tablet (1 mg total) by mouth one time only at 6 PM.           BP 146/74  Pulse 53  Temp 98.1 F (36.7 C) (Oral)  Resp 16  SpO2 98%  Physical Exam  Nursing note and vitals reviewed. Constitutional: She is oriented to person, place, and  time. She appears well-developed and well-nourished.  Non-toxic appearance. No distress.  HENT:  Head: Normocephalic and atraumatic.       6cm left forehead lac with controlled bleeding  Eyes: Conjunctivae normal, EOM and lids are normal. Pupils are equal, round, and reactive to light.  Neck: Normal range of motion. Neck supple. No tracheal deviation present. No mass present.  Cardiovascular: Normal rate, regular rhythm and normal heart sounds.  Exam reveals no gallop.   No murmur heard. Pulmonary/Chest: Effort normal and breath sounds normal. No stridor. No respiratory distress. She has no decreased breath sounds. She has no wheezes. She has no rhonchi. She has no rales.  Abdominal: Soft. Normal appearance and bowel sounds are normal. She exhibits no distension. There is no tenderness. There is no rebound and no CVA tenderness.  Musculoskeletal: Normal range of motion. She exhibits no edema and no tenderness.       Hands:      Legs: Neurological: She is alert and oriented to person, place, and time. She has normal strength. No cranial nerve deficit or sensory deficit. GCS eye subscore is 4. GCS verbal subscore is 5. GCS motor subscore is 6.  Skin: Skin is warm and dry. No abrasion and no rash noted.  Psychiatric: She has a normal mood and affect. Her speech is normal and behavior is normal.    ED Course  Procedures (including critical care time)   Labs Reviewed  URINALYSIS, ROUTINE W REFLEX MICROSCOPIC  URINE CULTURE  CBC WITH DIFFERENTIAL  COMPREHENSIVE METABOLIC PANEL  PROTIME-INR  TYPE AND SCREEN   No results found.   No diagnosis found.    MDM   Patient's laceration repaired by the physician assistant. Urinary tract infection treated with Rocephin and she'll be placed on Keflex.       Toy Baker, MD 06/16/12 1043  Toy Baker, MD 06/16/12 1153

## 2012-06-16 NOTE — ED Notes (Signed)
Pt arrived via [facility] already in gown, placed on continuous pulse oximetry and blood pressure cuff; family at bedside

## 2012-06-17 NOTE — ED Provider Notes (Signed)
Medical screening examination/treatment/procedure(s) were conducted as a shared visit with non-physician practitioner(s) and myself.  I personally evaluated the patient during the encounter  Toy Baker, MD 06/17/12 [phone]

## 2012-06-18 ENCOUNTER — Emergency Department (HOSPITAL_COMMUNITY): Payer: Medicare Other

## 2012-06-18 ENCOUNTER — Encounter (HOSPITAL_COMMUNITY): Payer: Self-pay | Admitting: Emergency Medicine

## 2012-06-18 ENCOUNTER — Inpatient Hospital Stay (HOSPITAL_COMMUNITY)
Admission: EM | Admit: 2012-06-18 | Discharge: 2012-06-25 | DRG: 853 | Disposition: A | Discharge status: Skilled Nursing Facility | Payer: Medicare Other | Attending: Internal Medicine | Admitting: Internal Medicine

## 2012-06-18 DIAGNOSIS — G959 Disease of spinal cord, unspecified: Secondary | ICD-10-CM

## 2012-06-18 DIAGNOSIS — M19049 Primary osteoarthritis, unspecified hand: Secondary | ICD-10-CM | POA: Diagnosis present

## 2012-06-18 DIAGNOSIS — Z86711 Personal history of pulmonary embolism: Secondary | ICD-10-CM

## 2012-06-18 DIAGNOSIS — R110 Nausea: Secondary | ICD-10-CM

## 2012-06-18 DIAGNOSIS — W19XXXA Unspecified fall, initial encounter: Secondary | ICD-10-CM | POA: Diagnosis present

## 2012-06-18 DIAGNOSIS — Y9210 Unspecified residential institution as the place of occurrence of the external cause: Secondary | ICD-10-CM | POA: Diagnosis present

## 2012-06-18 DIAGNOSIS — G20 Parkinson's disease: Secondary | ICD-10-CM | POA: Diagnosis present

## 2012-06-18 DIAGNOSIS — I119 Hypertensive heart disease without heart failure: Secondary | ICD-10-CM

## 2012-06-18 DIAGNOSIS — R4182 Altered mental status, unspecified: Secondary | ICD-10-CM

## 2012-06-18 DIAGNOSIS — R6889 Other general symptoms and signs: Secondary | ICD-10-CM

## 2012-06-18 DIAGNOSIS — Z780 Asymptomatic menopausal state: Secondary | ICD-10-CM

## 2012-06-18 DIAGNOSIS — S065XAA Traumatic subdural hemorrhage with loss of consciousness status unknown, initial encounter: Secondary | ICD-10-CM | POA: Diagnosis present

## 2012-06-18 DIAGNOSIS — A419 Sepsis, unspecified organism: Secondary | ICD-10-CM | POA: Diagnosis present

## 2012-06-18 DIAGNOSIS — I2699 Other pulmonary embolism without acute cor pulmonale: Secondary | ICD-10-CM

## 2012-06-18 DIAGNOSIS — A4152 Sepsis due to Pseudomonas: Principal | ICD-10-CM | POA: Diagnosis present

## 2012-06-18 DIAGNOSIS — G9341 Metabolic encephalopathy: Secondary | ICD-10-CM | POA: Diagnosis present

## 2012-06-18 DIAGNOSIS — M25512 Pain in left shoulder: Secondary | ICD-10-CM

## 2012-06-18 DIAGNOSIS — Z86718 Personal history of other venous thrombosis and embolism: Secondary | ICD-10-CM

## 2012-06-18 DIAGNOSIS — Z9181 History of falling: Secondary | ICD-10-CM

## 2012-06-18 DIAGNOSIS — Z79899 Other long term (current) drug therapy: Secondary | ICD-10-CM

## 2012-06-18 DIAGNOSIS — Z8679 Personal history of other diseases of the circulatory system: Secondary | ICD-10-CM

## 2012-06-18 DIAGNOSIS — N184 Chronic kidney disease, stage 4 (severe): Secondary | ICD-10-CM | POA: Diagnosis present

## 2012-06-18 DIAGNOSIS — S065X9A Traumatic subdural hemorrhage with loss of consciousness of unspecified duration, initial encounter: Secondary | ICD-10-CM

## 2012-06-18 DIAGNOSIS — I1310 Hypertensive heart and chronic kidney disease without heart failure, with stage 1 through stage 4 chronic kidney disease, or unspecified chronic kidney disease: Secondary | ICD-10-CM | POA: Diagnosis present

## 2012-06-18 DIAGNOSIS — R42 Dizziness and giddiness: Secondary | ICD-10-CM

## 2012-06-18 DIAGNOSIS — G20A1 Parkinson's disease without dyskinesia, without mention of fluctuations: Secondary | ICD-10-CM | POA: Diagnosis present

## 2012-06-18 DIAGNOSIS — Z7982 Long term (current) use of aspirin: Secondary | ICD-10-CM

## 2012-06-18 DIAGNOSIS — E785 Hyperlipidemia, unspecified: Secondary | ICD-10-CM | POA: Diagnosis present

## 2012-06-18 DIAGNOSIS — E871 Hypo-osmolality and hyponatremia: Secondary | ICD-10-CM

## 2012-06-18 DIAGNOSIS — Z87891 Personal history of nicotine dependence: Secondary | ICD-10-CM

## 2012-06-18 DIAGNOSIS — N390 Urinary tract infection, site not specified: Secondary | ICD-10-CM | POA: Diagnosis present

## 2012-06-18 DIAGNOSIS — M1710 Unilateral primary osteoarthritis, unspecified knee: Secondary | ICD-10-CM | POA: Diagnosis present

## 2012-06-18 DIAGNOSIS — Z7901 Long term (current) use of anticoagulants: Secondary | ICD-10-CM

## 2012-06-18 DIAGNOSIS — I10 Essential (primary) hypertension: Secondary | ICD-10-CM | POA: Diagnosis present

## 2012-06-18 DIAGNOSIS — E7800 Pure hypercholesterolemia, unspecified: Secondary | ICD-10-CM

## 2012-06-18 DIAGNOSIS — K589 Irritable bowel syndrome without diarrhea: Secondary | ICD-10-CM | POA: Diagnosis present

## 2012-06-18 LAB — URINE MICROSCOPIC-ADD ON

## 2012-06-18 LAB — COMPREHENSIVE METABOLIC PANEL
ALT: 20 U/L (ref 0–35)
AST: 35 U/L (ref 0–37)
Albumin: 2.7 g/dL — ABNORMAL LOW (ref 3.5–5.2)
Alkaline Phosphatase: 67 U/L (ref 39–117)
BUN: 33 mg/dL — ABNORMAL HIGH (ref 6–23)
CO2: 19 mEq/L (ref 19–32)
Calcium: 8.5 mg/dL (ref 8.4–10.5)
Chloride: 88 mEq/L — ABNORMAL LOW (ref 96–112)
Creatinine, Ser: 1.84 mg/dL — ABNORMAL HIGH (ref 0.50–1.10)
GFR calc Af Amer: 27 mL/min — ABNORMAL LOW (ref >90)
GFR calc non Af Amer: 24 mL/min — ABNORMAL LOW (ref >90)
Glucose, Bld: 84 mg/dL (ref 70–99)
Potassium: 3.1 mEq/L — ABNORMAL LOW (ref 3.5–5.1)
Sodium: 124 mEq/L — ABNORMAL LOW (ref 135–145)
Total Bilirubin: 0.6 mg/dL (ref 0.3–1.2)
Total Protein: 6.5 g/dL (ref 6.0–8.3)

## 2012-06-18 LAB — CBC WITH DIFFERENTIAL/PLATELET
Basophils Absolute: 0 10*3/uL (ref 0.0–0.1)
Basophils Relative: 0 % (ref 0–1)
Eosinophils Absolute: 0 10*3/uL (ref 0.0–0.7)
Eosinophils Relative: 0 % (ref 0–5)
HCT: 29.9 % — ABNORMAL LOW (ref 36.0–46.0)
Hemoglobin: 10.4 g/dL — ABNORMAL LOW (ref 12.0–15.0)
Lymphocytes Relative: 5 % — ABNORMAL LOW (ref 12–46)
Lymphs Abs: 0.9 10*3/uL (ref 0.7–4.0)
MCH: 30.2 pg (ref 26.0–34.0)
MCHC: 34.8 g/dL (ref 30.0–36.0)
MCV: 86.9 fL (ref 78.0–100.0)
Monocytes Absolute: 0.9 10*3/uL (ref 0.1–1.0)
Monocytes Relative: 5 % (ref 3–12)
Neutro Abs: 16.8 10*3/uL — ABNORMAL HIGH (ref 1.7–7.7)
Neutrophils Relative %: 90 % — ABNORMAL HIGH (ref 43–77)
Platelets: 517 10*3/uL — ABNORMAL HIGH (ref 150–400)
RBC: 3.44 MIL/uL — ABNORMAL LOW (ref 3.87–5.11)
RDW: 12.8 % (ref 11.5–15.5)
WBC: 18.7 10*3/uL — ABNORMAL HIGH (ref 4.0–10.5)

## 2012-06-18 LAB — URINALYSIS, ROUTINE W REFLEX MICROSCOPIC
Bilirubin Urine: NEGATIVE
Glucose, UA: NEGATIVE mg/dL
Hgb urine dipstick: NEGATIVE
Ketones, ur: NEGATIVE mg/dL
Nitrite: NEGATIVE
Protein, ur: 30 mg/dL — AB
Specific Gravity, Urine: 1.021 (ref 1.005–1.030)
Urobilinogen, UA: 0.2 mg/dL (ref 0.0–1.0)
pH: 5.5 (ref 5.0–8.0)

## 2012-06-18 LAB — LACTIC ACID, PLASMA: Lactic Acid, Venous: 0.7 mmol/L (ref 0.5–2.2)

## 2012-06-18 LAB — PROTIME-INR
INR: 1.73 — ABNORMAL HIGH (ref 0.00–1.49)
Prothrombin Time: 19.7 seconds — ABNORMAL HIGH (ref 11.6–15.2)

## 2012-06-18 LAB — TROPONIN I: Troponin I: <0.3 ng/mL (ref <0.30)

## 2012-06-18 MED ORDER — VITAMIN K1 10 MG/ML IJ SOLN
10.0000 mg | Freq: Once | INTRAVENOUS | Status: AC
  Administered 2012-06-18: 10 mg via INTRAVENOUS
  Filled 2012-06-18: qty 1

## 2012-06-18 MED ORDER — SODIUM CHLORIDE 0.9 % IV SOLN
INTRAVENOUS | Status: DC
  Administered 2012-06-18 – 2012-06-25 (×9): via INTRAVENOUS

## 2012-06-18 MED ORDER — DEXTROSE 5 % IV SOLN: 1.0000 g | INTRAVENOUS | Status: DC

## 2012-06-18 MED ORDER — METOPROLOL SUCCINATE ER 25 MG PO TB24
25.0000 mg | ORAL_TABLET | Freq: Two times a day (BID) | ORAL | Status: DC
  Administered 2012-06-19 – 2012-06-25 (×13): 25 mg via ORAL
  Filled 2012-06-18 (×15): qty 1

## 2012-06-18 MED ORDER — SERTRALINE HCL 25 MG PO TABS
25.0000 mg | ORAL_TABLET | Freq: Every day | ORAL | Status: DC
  Filled 2012-06-18: qty 1

## 2012-06-18 MED ORDER — OXYBUTYNIN CHLORIDE ER 5 MG PO TB24
5.0000 mg | ORAL_TABLET | Freq: Every day | ORAL | Status: DC
  Filled 2012-06-18 (×2): qty 1

## 2012-06-18 MED ORDER — CIPROFLOXACIN IN D5W 400 MG/200ML IV SOLN: 400.0000 mg | Freq: Once | INTRAVENOUS | Status: DC

## 2012-06-18 MED ORDER — CIPROFLOXACIN IN D5W 400 MG/200ML IV SOLN
400.0000 mg | Freq: Two times a day (BID) | INTRAVENOUS | Status: DC
  Administered 2012-06-18 – 2012-06-19 (×2): 400 mg via INTRAVENOUS
  Filled 2012-06-18 (×3): qty 200

## 2012-06-18 MED ORDER — POTASSIUM CHLORIDE 10 MEQ/100ML IV SOLN
10.0000 meq | INTRAVENOUS | Status: AC
  Administered 2012-06-19 (×3): 10 meq via INTRAVENOUS
  Filled 2012-06-18 (×4): qty 100

## 2012-06-18 MED ORDER — CIPROFLOXACIN IN D5W 400 MG/200ML IV SOLN
400.0000 mg | Freq: Once | INTRAVENOUS | Status: DC
  Filled 2012-06-18: qty 200

## 2012-06-18 MED ORDER — HYDRALAZINE HCL 50 MG PO TABS
200.0000 mg | ORAL_TABLET | Freq: Three times a day (TID) | ORAL | Status: DC
  Administered 2012-06-19 – 2012-06-21 (×6): 200 mg via ORAL
  Filled 2012-06-18 (×12): qty 4

## 2012-06-18 MED ORDER — SODIUM CHLORIDE 0.9 % IJ SOLN
3.0000 mL | Freq: Two times a day (BID) | INTRAMUSCULAR | Status: DC
  Administered 2012-06-19 – 2012-06-24 (×7): 3 mL via INTRAVENOUS

## 2012-06-18 MED ORDER — SODIUM CHLORIDE 0.9 % IV BOLUS (SEPSIS): 1000.0000 mL | Freq: Once | INTRAVENOUS | Status: DC

## 2012-06-18 NOTE — ED Notes (Signed)
Per EMS, pt fell x2 days ago.  Pt husband and son visited her today and "felt that she was not acting like herself.  She is more sleepy than usual."  Pt has a UTI, [employer] states that she "refuses meds."  Per pt's family, pt has had n/v/d since yesterday.  Pt at [facility] since 11/4 for rehab after a "blood clot" was found at [facility].

## 2012-06-18 NOTE — ED Provider Notes (Signed)
History     CSN: [id number]  Arrival date & time 06/18/12  1709   First MD Initiated Contact with Patient 06/18/12 1800      Chief Complaint  Patient presents with  . Altered Mental Status    (Consider location/radiation/quality/duration/timing/severity/associated sxs/prior treatment) HPI Patient presents to the emergency department with family from an assisted living facility.  Patient was noted by family to be less responsive yesterday with some vomiting.  He went to visit her today and she was not responsive to them at all.  Patient is unable to give me any history.  Family states the nursing home, states they were not giving her her medications.  Patient was [facility] on Saturday for a fall with negative head CT, but found to  have a urinary tract infection. Past Medical History  Diagnosis Date  . Hypertension   . History of orthostatic hypotension   . IBS (irritable bowel syndrome)   . Parkinson's disease     ATYPICAL  . Dizziness   . Vertigo   . Fatigue   . Advanced age   . HLD (hyperlipidemia)     intolerant to lipid lowering drugs  . Normal nuclear stress test 2003  . Fall at home 05/2012    mechanical fall  (06/01/2012)  . Pulmonary embolism 05/31/2012  . Chronic kidney disease, stage 4 (severe)   . Exertional dyspnea   . Arthritis     "legs, fingers" (06/01/2012)    Past Surgical History  Procedure Date  . Small intestine surgery   . Hemorrhoid surgery 1970    "have4 had accidents ever since" (06/01/2012)  . Abdominal hysterectomy 1970's  . Tonsillectomy 1954  . Cholecystectomy 1955?  Marland Kitchen Breast cyst excision 1970's    left  . Cataract extraction w/ intraocular lens  implant, bilateral ? 1990's  . Shoulder open rotator cuff repair 1990?    right    Family History  Problem Relation Age of Onset  . Stroke Father     History  Substance Use Topics  . Smoking status: Former Smoker -- 1.0 packs/day for 20 years    Types: Cigarettes   Quit date: 02/14/1971  . Smokeless tobacco: Never Used  . Alcohol Use: No    OB History    Grav Para Term Preterm Abortions TAB SAB Ect Mult Living                  Review of Systems Level V caveat applies due to altered mental status Allergies  Amlodipine; Codeine; Lipitor; Lisinopril; Red yeast rice; and Zetia  Home Medications   Current Outpatient Rx  Name  Route  Sig  Dispense  Refill  . ASPIRIN 81 MG PO TABS   Oral   Take 81 mg by mouth daily.         Marland Kitchen HYDRALAZINE HCL 100 MG PO TABS   Oral   Take 2 tablets (200 mg total) by mouth every 8 (eight) hours.         Marland Kitchen HYDROCHLOROTHIAZIDE 25 MG PO TABS   Oral   Take 12.5 mg by mouth daily.         Marland Kitchen METOPROLOL SUCCINATE ER 25 MG PO TB24   Oral   Take 25 mg by mouth 2 (two) times daily.         . ADULT MULTIVITAMIN W/MINERALS CH   Oral   Take 1 tablet by mouth daily.         Marland Kitchen  OXYBUTYNIN CHLORIDE ER 5 MG PO TB24   Oral   Take 1 tablet (5 mg total) by mouth at bedtime.   30 tablet   0   . SERTRALINE HCL 25 MG PO TABS   Oral   Take 1 tablet (25 mg total) by mouth at bedtime.   30 tablet      . WARFARIN SODIUM 1 MG PO TABS   Oral   Take 1 mg by mouth daily.         . CEPHALEXIN 750 MG PO CAPS   Oral   Take 750 mg by mouth 4 (four) times daily. For 7 days; Start date 06/11/12 for UTI.           BP 155/69  Pulse 70  Temp 100.7 F (38.2 C) (Rectal)  Resp 18  Wt 144 lb (65.318 kg)  SpO2 98%  Physical Exam  Nursing note and vitals reviewed. Constitutional: She appears well-developed and well-nourished. No distress.  HENT:  Head: Normocephalic and atraumatic. No trismus in the jaw.  Mouth/Throat: Uvula is midline. Mucous membranes are dry. No uvula swelling.  Eyes: Pupils are equal, round, and reactive to light.  Cardiovascular: Normal rate, regular rhythm and normal heart sounds.  Exam reveals no gallop and no friction rub.   No murmur heard. Pulmonary/Chest: Effort normal and breath  sounds normal.  Neurological:       Patient would open eyes, but did not respond verbally  Skin: Skin is warm and dry. No rash noted.    ED Course  Procedures (including critical care time)   Labs Reviewed  CBC WITH DIFFERENTIAL  COMPREHENSIVE METABOLIC PANEL  URINALYSIS, ROUTINE W REFLEX MICROSCOPIC  URINE CULTURE  LACTIC ACID, PLASMA  TROPONIN I    Patient will have repeat head CT performed due to her changes in mental status along with her blood thinning medications.  I spoke with Dr. Newell Coral from neurosurgery, who advised me to have the patient admitted to the hospitalist service at [facility].  Patient has been stable as far in the emergency department.  I spoke with try a hospitalist, who will admit the patient [facility].  MDM   Date: 06/18/2012  Rate:72  Rhythm: normal sinus rhythm  QRS Axis: normal  Intervals: normal  ST/T Wave abnormalities: new t wave inversions  Conduction Disutrbances:first-degree A-V block   Narrative Interpretation:   Old EKG Reviewed: changes noted  MDM Reviewed: vitals, nursing note and previous chart Reviewed previous: x-ray and CT scan Interpretation: labs, x-ray, CT scan and ECG            Carlyle Dolly, PA-C 06/18/12 2044

## 2012-06-18 NOTE — ED Notes (Signed)
Pt to CT

## 2012-06-18 NOTE — ED Notes (Signed)
Pt back from CT

## 2012-06-18 NOTE — Progress Notes (Signed)
Brief update: spoke with Neurosurgery, given improvement in mental status and lack of mass effect on CT head probably not planning surgery tonight, Neurosurgery gave OK to make patient not strict NPO for surgical purposes.  Will Resume home meds except coumadin, asa, and HCTZ (due to hyponatremia).  Goal INR 1.0-1.4 range, goal BP he is less concerned about and 150-170 is ok he states.  Plan 2 unit FFP, recheck INR, plan on Q4H BMPs to monitor sodium, patient may or may not need IVC filter given inability to anticoagulate with recent DVT/PE, will defer this decision to day team.

## 2012-06-18 NOTE — Progress Notes (Signed)
CSW met with pt, pt spouse, and pt grandson at bedside. Pt is currently sleeping and not engaging in conversation. Pt spouse shared with CSW that pt is a resident at [facility] for short term rehab. Patient spouse and grandson shared that patient has had two recent falls at [facility], one resulting in a head wound requiring stiches on Saturday 11/09 at [facility]. Pt spouse is concerned that staff at [facility] are not able to encourage patient to take medication and are not able to supervise patient enough. CSW, pt spouse, and pt grandson discussed speaking with [facility] once pt is evaluated by EDP regarding education with [facility] staff and possible increased private care at snf if patient requires more assistance than what [facility] can provide.   CSW awaiting EDP evaluation to help determine pt plan of care. CSW will follow up with patients family as soon as evaluated by EDP.   Marland KitchenClinical social worker continuing to follow pt to assist with pt dc plans and further csw needs.   Catha Gosselin, LCSWA  [phone] .06/18/2012 1802pm

## 2012-06-18 NOTE — ED Notes (Signed)
Verbally notified phlebotomy of need for blood draw.

## 2012-06-18 NOTE — H&P (Signed)
[facility] History and Physical  TIFANY HIRSCH JYN:[id number] DOB: [birth year] DOA: 06/18/2012  Referring physician: ED PCP: [name], MD  Specialists: Dr. Newell Coral Neurosurgery  Chief Complaint: AMS  HPI: Nicole Harding is a 78 y.o. female who presents with AMS.  Onset yesterday associated with some vomiting, family went to visit her at her ALF today and she was unresponsive completely to them.  This occurs in the context of a UTI with recent fall 2 days ago where she hit her head, patient is on coumadin for a DVT with PE diagnosed a couple of weeks ago.  She has shown some minimal improvement since arrival in the ED per her family.  Although a CT scan of her head was performed after the fall 2 days ago, there was not an evident SDH at that time per the read.  A repeat CT head performed today 06/18/12, does demonstrate a new acute SDH that is now evident at 5mm size, believed to have been present previously at just 2mm of size.  Review of Systems: Patient is not really able to participate in a ROS at this time.  Past Medical History  Diagnosis Date  . Hypertension   . History of orthostatic hypotension   . IBS (irritable bowel syndrome)   . Parkinson's disease     ATYPICAL  . Dizziness   . Vertigo   . Fatigue   . Advanced age   . HLD (hyperlipidemia)     intolerant to lipid lowering drugs  . Normal nuclear stress test 2003  . Fall at home 05/2012    mechanical fall  (06/01/2012)  . Pulmonary embolism 05/31/2012  . Chronic kidney disease, stage 4 (severe)   . Exertional dyspnea   . Arthritis     "legs, fingers" (06/01/2012)   Past Surgical History  Procedure Date  . Small intestine surgery   . Hemorrhoid surgery 1970    "have4 had accidents ever since" (06/01/2012)  . Abdominal hysterectomy 1970's  . Tonsillectomy 1954  . Cholecystectomy 1955?  Marland Kitchen Breast cyst excision 1970's    left  . Cataract extraction w/ intraocular lens  implant, bilateral  ? 1990's  . Shoulder open rotator cuff repair 1990?    right   Social History:  reports that she quit smoking about 41 years ago. Her smoking use included Cigarettes. She has a 20 pack-year smoking history. She has never used smokeless tobacco. She reports that she does not drink alcohol or use illicit drugs. Patient lives at ALF.  Allergies  Allergen Reactions  . Amlodipine Nausea And Vomiting and Swelling    Swelling --sick and ended up in hospital N&V  . Codeine Nausea And Vomiting  . Lipitor (Atorvastatin Calcium) Other (See Comments)    "cramps my legs"  . Lisinopril Other (See Comments)    unknown  . Red Yeast Rice Other (See Comments)    unknown  . Zetia (Ezetimibe) Other (See Comments)    unknown    Family History  Problem Relation Age of Onset  . Stroke Father     Prior to Admission medications   Medication Sig Start Date End Date Taking? Authorizing Provider  aspirin 81 MG tablet Take 81 mg by mouth daily.   Yes Historical Provider, MD  hydrALAZINE (APRESOLINE) 100 MG tablet Take 2 tablets (200 mg total) by mouth every 8 (eight) hours. 06/11/12  Yes Rhetta Mura, MD  hydrochlorothiazide (HYDRODIURIL) 25 MG tablet Take 12.5 mg by mouth daily.   Yes Historical  Provider, MD  metoprolol succinate (TOPROL-XL) 25 MG 24 hr tablet Take 25 mg by mouth 2 (two) times daily.   Yes Historical Provider, MD  Multiple Vitamin (MULITIVITAMIN WITH MINERALS) TABS Take 1 tablet by mouth daily.   Yes Historical Provider, MD  oxybutynin (DITROPAN-XL) 5 MG 24 hr tablet Take 1 tablet (5 mg total) by mouth at bedtime. 06/11/12  Yes Rhetta Mura, MD  sertraline (ZOLOFT) 25 MG tablet Take 1 tablet (25 mg total) by mouth at bedtime. 06/11/12  Yes Rhetta Mura, MD  warfarin (COUMADIN) 1 MG tablet Take 1 mg by mouth daily. 06/11/12  Yes Rhetta Mura, MD  cephALEXin (KEFLEX) 750 MG capsule Take 750 mg by mouth 4 (four) times daily. For 7 days; Start date 06/11/12 for UTI.  06/11/12 06/18/12  Rhetta Mura, MD   Physical Exam: Filed Vitals:   06/18/12 1720 06/18/12 1804 06/18/12 1836 06/18/12 2117  BP: 198/67 155/69  174/72  Pulse: 73 70  83  Temp: 99.1 F (37.3 C)  100.7 F (38.2 C)   TempSrc: Axillary  Rectal   Resp: 18   21  Weight: 65.318 kg (144 lb)     SpO2: 93% 98%  95%    General:  NAD, lethargic Eyes: PEERLA EOMI ENT: mucous membranes moist Neck: supple w/o JVD Cardiovascular: RRR w/o MRG Respiratory: CTA B Abdomen: soft, nt, nd, bs+ Skin: no rash nor lesion Musculoskeletal: MAE, full ROM all 4 extremities Psychiatric: unable to assess due to AMS Neurologic: lethargic, does obey commands and open eyes to voice, GCS 15, generalized weakness but nothing focal that I am able to appreciate, speech is slurred.  Labs on Admission:  Basic Metabolic Panel:  Lab 06/18/12 3244 06/16/12 0819  NA 124* 130*  K 3.1* 3.0*  CL 88* 92*  CO2 19 23  GLUCOSE 84 96  BUN 33* 42*  CREATININE 1.84* 2.09*  CALCIUM 8.5 8.4  MG -- --  PHOS -- --   Liver Function Tests:  Lab 06/18/12 1940 06/16/12 0819  AST 35 30  ALT 20 19  ALKPHOS 67 64  BILITOT 0.6 0.4  PROT 6.5 6.4  ALBUMIN 2.7* 2.7*   No results found for this basename: LIPASE:5,AMYLASE:5 in the last 168 hours No results found for this basename: AMMONIA:5 in the last 168 hours CBC:  Lab 06/18/12 1940 06/16/12 0819  WBC 18.7* 11.3*  NEUTROABS 16.8* 9.5*  HGB 10.4* 10.8*  HCT 29.9* 32.4*  MCV 86.9 89.8  PLT 517* 498*   Cardiac Enzymes:  Lab 06/18/12 1940  CKTOTAL --  CKMB --  CKMBINDEX --  TROPONINI <0.30    BNP (last 3 results) No results found for this basename: PROBNP:3 in the last 8760 hours CBG: No results found for this basename: GLUCAP:5 in the last 168 hours  Radiological Exams on Admission: Ct Head Wo Contrast  06/18/2012  *RADIOLOGY REPORT*  Clinical Data: Altered mental status.  Recent falls.  CT HEAD WITHOUT CONTRAST  Technique:  Contiguous axial images  were obtained from the base of the skull through the vertex without contrast.  Comparison: 06/16/2012.  Findings: There is an acute left subdural hematoma extending over the left frontotemporal region.  Maximum thickness of 5 mm.  No underlying skull fracture. Slight mass effect on the underlying brain.  Left frontal scalp hematoma persists. This subdural hematoma is more obvious than on the prior study where it was only 2 mm thick.  Advanced atrophy with chronic microvascular ischemic change. Numerous lacunes.  No subarachnoid  or intraventricular blood.  No acute sinus or mastoid fluid.  IMPRESSION: Acute left subdural hematoma in the frontotemporal region.  This is increased since the prior study on 11/09.  Critical Value/emergent results were called by telephone at the time of interpretation on 06/18/2012 at 2000 hours to ordering provider, who verbally acknowledged these results.   Original Report Authenticated By: Davonna Belling, M.D.    Dg Chest Port 1 View  06/18/2012  *RADIOLOGY REPORT*  Clinical Data: Altered mental status and weakness.  PORTABLE CHEST - 1 VIEW  Comparison: 06/05/2012  Findings: Numerous leads and wires project over the chest.  Patient rotated to the right. Normal heart size.  No pleural effusion or pneumothorax.  Clear lungs.  IMPRESSION: No acute cardiopulmonary disease.   Original Report Authenticated By: Jeronimo Greaves, M.D.     EKG: Independently reviewed.  Assessment/Plan Principal Problem:  *Altered mental status Active Problems:  UTI (urinary tract infection)  HTN (hypertension)  SDH (subdural hematoma)   1. Altered mental status - DDX at this point includes: 1. SDH - noted to be present and increased in size from time of her fall 2 days ago and CT head done at that time, have started medical treatment with vit K to reverse coumadin coagulopathy, INR today was 1.7 but plan on giving FFP since patient has active bleed in CNS, recheck INR after transfusion.  Neurosurgery  is aware of patient and will evaluate on arrival to [facility], she may require surgical intervention. 2. Hyponatremia - secondary to dehydration vs cerebral salt wasting, suspect dehydration as patients AMS appears to be improved with just NS here in ED, checking Q4H BMPs to make sure we are correcting but do not correct too quickly.  No evidence of seizure, no evidence of cerebral edema. 3. UTI secondary to Pseudomonas - will treat with cipro which was sensitive on cultures 2 days ago. 2. CKD - appears to be at baseline for this patient at this time. 3. HTN - holding PO meds at this time which patient hasnt been able to take at home anyhow, may need to treat with PRN labetalol, need to clarify BP goals with Neurosurgery. 4. Sepsis - UTI source, has leukocytosis and mild fever recorded in ED.  Dr. Newell Coral has been formally consulted and per ED is planning on seeing patient upon arrival to [facility] stepdown unit.  Code Status: Full Code Family Communication: Spoke with Son who is at bedside Disposition Plan: Admit to stepdown at [facility] likely here for several days, may need Craniotomy  Time spent: 70 min  GARDNER, JARED M. [facility] Pager [phone]  If 7PM-7AM, please contact night-coverage [email or web address] Password [facility] 06/18/2012, 9:41 PM

## 2012-06-18 NOTE — ED Notes (Signed)
XBM:[id number]<KG> Expected date:<BR> Expected time:<BR> Means of arrival:Ambulance<BR> Comments:<BR> EMS- AMS, fall yesterday

## 2012-06-18 NOTE — Progress Notes (Signed)
Clinical Social Work Department BRIEF PSYCHOSOCIAL ASSESSMENT 06/18/2012  Patient:  Nicole Harding, Nicole Harding     Account Number:  [id number]     Admit date:  06/18/2012  Clinical Social Worker:  Doree Albee  Date/Time:  06/18/2012 09:00 AM  Referred by:  CSW  Date Referred:  06/18/2012 Referred for  SNF Placement   Other Referral:   Interview type:  Family Other interview type:    PSYCHOSOCIAL DATA Living Status:  FACILITY Admitted from facility:  [facility] Level of care:  Skilled Nursing Facility Primary support name:  Nicole Harding Primary support relationship to patient:  SPOUSE Degree of support available:   strong    CURRENT CONCERNS  Other Concerns:    SOCIAL WORK ASSESSMENT / PLAN CSW met with pt, pt spouse, and pt grandson at bedside. Pt son later joined. Pt was resting comfortbly in bed, however unbale to engage in conversation. Pt spouse shared that pt was  resident at [facility] since 11/4 for short term rehab.    Pt spouse shared that he had some concerns regarding patient recieving the medication she needed. Pt spouse states that it takes some encouraging for patient to take medications. Pt spouse shared that he believe patients has not been recieving medication due to patient refusing and staff not adequately encouraging patient.    CSW and pt spouse discussed that staff is not able to force pt to take medication, however some education regarding how spouse is able to assist with pt medication may help. pt spouse agreed that it may be approrpriate.    Pt grandson reproted that patient has fallen twice at [facility] and pt family thought more precautions would have been taken in order to prevent falls. Pt family and csw discussed pt family speaking with nursing to have mats beside pt bed, lower pt bed to floor, and possibly private care in addition to staff at snf may be apppropriate.    CSW met with pt spouse after pt had been evaluated. At this  time pt is being evaluted by neurogly at [facility] and will be admitted. CSW and pt spouse discussed working with unit CSW at [facility] in order to assist with pt needs once pt plan of care had been determined. Pt husband was tearful however thanked csw for concern and support. CSW provided emotional support to pt family.    Pt family does hope for patient to return to [facility] when medically stable as long as pt needs can be met.   Assessment/plan status:  Psychosocial Support/Ongoing Assessment of Needs Other assessment/ plan:   Information/referral to community resources:   none identified at this time.    PATIENT'S/FAMILY'S RESPONSE TO PLAN OF CARE: Pt spouse, son, and grandson thanked csw for concern and support. Pt family hope for patient to return to snf for short term rehab once medically stable.      Catha Gosselin, LCSWA  [phone] .06/18/2012 9:00pm

## 2012-06-19 DIAGNOSIS — Z86711 Personal history of pulmonary embolism: Secondary | ICD-10-CM

## 2012-06-19 DIAGNOSIS — Z7901 Long term (current) use of anticoagulants: Secondary | ICD-10-CM

## 2012-06-19 DIAGNOSIS — N390 Urinary tract infection, site not specified: Secondary | ICD-10-CM | POA: Diagnosis present

## 2012-06-19 LAB — BASIC METABOLIC PANEL
BUN: 33 mg/dL — ABNORMAL HIGH (ref 6–23)
Calcium: 8.6 mg/dL (ref 8.4–10.5)
Chloride: 93 mEq/L — ABNORMAL LOW (ref 96–112)
Creatinine, Ser: 1.76 mg/dL — ABNORMAL HIGH (ref 0.50–1.10)
GFR calc Af Amer: 30 mL/min — ABNORMAL LOW (ref >90)
GFR calc Af Amer: 29 mL/min — ABNORMAL LOW (ref >90)
GFR calc non Af Amer: 26 mL/min — ABNORMAL LOW (ref >90)
GFR calc non Af Amer: 25 mL/min — ABNORMAL LOW (ref >90)
Glucose, Bld: 134 mg/dL — ABNORMAL HIGH (ref 70–99)
Potassium: 3.1 mEq/L — ABNORMAL LOW (ref 3.5–5.1)
Potassium: 3 mEq/L — ABNORMAL LOW (ref 3.5–5.1)
Sodium: 130 mEq/L — ABNORMAL LOW (ref 135–145)
Sodium: 129 mEq/L — ABNORMAL LOW (ref 135–145)

## 2012-06-19 LAB — PROTIME-INR
INR: 1.48 (ref 0.00–1.49)
INR: 1.21 (ref 0.00–1.49)
Prothrombin Time: 17.5 seconds — ABNORMAL HIGH (ref 11.6–15.2)
Prothrombin Time: 15.1 seconds (ref 11.6–15.2)

## 2012-06-19 LAB — URINE CULTURE

## 2012-06-19 LAB — CBC
HCT: 24.9 % — ABNORMAL LOW (ref 36.0–46.0)
Hemoglobin: 8.6 g/dL — ABNORMAL LOW (ref 12.0–15.0)
RBC: 2.82 MIL/uL — ABNORMAL LOW (ref 3.87–5.11)
WBC: 13.5 10*3/uL — ABNORMAL HIGH (ref 4.0–10.5)

## 2012-06-19 LAB — GLUCOSE, CAPILLARY

## 2012-06-19 MED ORDER — VITAMIN K1 10 MG/ML IJ SOLN
10.0000 mg | Freq: Once | INTRAVENOUS | Status: AC
  Administered 2012-06-19: 10 mg via INTRAVENOUS
  Filled 2012-06-19: qty 1

## 2012-06-19 MED ORDER — BACITRACIN-NEOMYCIN-POLYMYXIN 400-5-5000 EX OINT
TOPICAL_OINTMENT | Freq: Every day | CUTANEOUS | Status: DC
  Administered 2012-06-19: 1 via TOPICAL
  Filled 2012-06-19 (×2): qty 1

## 2012-06-19 MED ORDER — CIPROFLOXACIN IN D5W 400 MG/200ML IV SOLN
400.0000 mg | INTRAVENOUS | Status: DC
  Administered 2012-06-20: 400 mg via INTRAVENOUS
  Filled 2012-06-19 (×2): qty 200

## 2012-06-19 MED ORDER — POTASSIUM CHLORIDE CRYS ER 20 MEQ PO TBCR
40.0000 meq | EXTENDED_RELEASE_TABLET | Freq: Three times a day (TID) | ORAL | Status: DC
  Administered 2012-06-19 – 2012-06-20 (×2): 40 meq via ORAL
  Filled 2012-06-19 (×3): qty 2

## 2012-06-19 NOTE — Progress Notes (Addendum)
[facility] Progress Note [place] TEAM 1 - Stepdown/ICU TEAM   Nicole Harding ZOX:[id number] DOB: July 23, 1924 DOA: 06/18/2012 PCP: [name], MD  Brief narrative: 78 year old female who was sent to the hospital because of altered mental status for 24 hours. This was associated with vomiting. Patient normally resides at an assisted living facility and when the family went to visit her they found her to be unresponsive. 2 days prior she had fallen and was evaluated in the ER and CT of the head done during that visit demonstrated no evidence of subdural hematoma but she did have a urinary tract infection. Important to note that the patient prior to admission was on Coumadin for new diagnosis of DVT with pulmonary embolus diagnosed 2 weeks prior. CT scan was repeated after arrival to the emergency department and this demonstrated a new acute tiny subdural hematoma. She was subsequently admitted to the step down unit for further monitoring and treatment. After the decision was made to admit the patient the admitting physician documents that he did have a conversation with the neurosurgeon who felt the patient currently was not a candidate for acute neurosurgery recommended the patient be transferred to [facility] to monitor for evolving subdural hematoma that may require surgical intervention.  Assessment/Plan: Principal Problem:  *SDH (subdural hematoma) *Secondary to fall in the setting of chronic anticoagulation *Anticoagulation has been reversed and Coumadin has been discontinued indefinitely *Hematoma is very small and followup CT scan of head is pending for 06/20/2012 *Appreciate neurosurgical assistance  Active Problems:  Metabolic encephalopathy *Likely related to recent UTI *Family also endorses that prior to the last discharge, patient had been started on new medications-  Zoloft and oxybutynin - since that time they have noted the patient's mental status has  not been at her baseline - we will discontinue these medications and follow response   Sepsis due to Pseudomonas/UTI (lower urinary tract infection) due to pseudomonas *Currently hemodynamically stable *Continue Cipro based on urine culture results  Hyponatremia Hold HCTZ, encourage PO liquids and cont IV NS.   History of pulmonary embolism/Warfarin anticoagulation *Anticoagulation has been reversed but INR remains slightly elevated so we'll repeat PT/INR today *Discuss with neurosurgery/Dr. Newell Coral. Recommendation is that patient will need to have Coumadin held at least 2-3 months because of subdural hematoma. She also has a history of recurrent falls and does not appear to be an appropriate candidate for future anticoagulation  *Plan IVC filter placement-I have contacted interventional radiology   Benign hypertensive heart disease without heart failure *Blood pressure moderately controlled *Continue hydralazine and metoprolol   CKD (chronic kidney disease) stage 4, GFR 15-29 ml/min *Creatinine just above baseline of around 1.6-2.1   Fall-recurrent *Was resident of assisted living facility prior to admission *Once encephalopathy improves, will need to have formal PT OT evaluation    DVT prophylaxis: SCDs-cannot anticoagulate because of acute subdural hematoma Code Status: Full Family Communication: Spoke to patient and family Disposition Plan: Remain in step down  Consultants: Neurosurgery  Procedures: IVC filter placement pending  Antibiotics: Cipro 11/11 >>>  HPI/Subjective: Patient was extremely lethargic earlier this morning but was arousable and able to follow simple commands easily.   Objective: Blood pressure 157/60, pulse 58, temperature 97.8 F (36.6 C), temperature source Oral, resp. rate 16, height 5\' 5"  (1.651 m), weight 67.4 kg (148 lb 9.4 oz), SpO2 98.00%.  Intake/Output Summary (Last 24 hours) at 06/19/12 1458 Last data filed at 06/19/12 1200  Gross  per 24 hour  Intake  3302.5 ml  Output    580 ml  Net 2722.5 ml     Exam: General: No acute respiratory distress HEENT: Left eye with periorbital ecchymosis otherwise exam unremarkable Lungs: Clear to auscultation bilaterally without wheezes or crackles, nasal cannula oxygen Cardiovascular: Regular rate and rhythm without murmur gallop or rub normal S1 and S2, or peripheral edema Abdomen: Nontender, nondistended, soft, bowel sounds positive, no rebound, no ascites, no appreciable mass Musculoskeletal: No significant cyanosis, clubbing of bilateral lower extremities Neurological: Patient is very lethargic but is arousable and opens eyes, pupils are equal and react to light, follows simple commands  Data Reviewed: Basic Metabolic Panel:  Lab 06/19/12 0630 06/18/12 1940 06/16/12 0819  NA 130* 124* 130*  K 3.1* 3.1* 3.0*  CL 93* 88* 92*  CO2 22 19 23   GLUCOSE 86 84 96  BUN 32* 33* 42*  CREATININE 1.73* 1.84* 2.09*  CALCIUM 8.3* 8.5 8.4  MG -- -- --  PHOS -- -- --   Liver Function Tests:  Lab 06/18/12 1940 06/16/12 0819  AST 35 30  ALT 20 19  ALKPHOS 67 64  BILITOT 0.6 0.4  PROT 6.5 6.4  ALBUMIN 2.7* 2.7*   No results found for this basename: LIPASE:5,AMYLASE:5 in the last 168 hours No results found for this basename: AMMONIA:5 in the last 168 hours CBC:  Lab 06/19/12 0530 06/18/12 1940 06/16/12 0819  WBC 13.5* 18.7* 11.3*  NEUTROABS -- 16.8* 9.5*  HGB 8.6* 10.4* 10.8*  HCT 24.9* 29.9* 32.4*  MCV 88.3 86.9 89.8  PLT 381 517* 498*   Cardiac Enzymes:  Lab 06/18/12 1940  CKTOTAL --  CKMB --  CKMBINDEX --  TROPONINI <0.30   BNP (last 3 results) No results found for this basename: PROBNP:3 in the last 8760 hours CBG:  Lab 06/19/12 0851  GLUCAP 90    Recent Results (from the past 240 hour(s))  URINE CULTURE     Status: Normal   Collection Time   06/09/12 11:13 PM      Component Value Range Status Comment   Specimen Description URINE, CATHETERIZED   Final      Special Requests NONE   Final    Culture  Setup Time 06/10/2012 15:59   Final    Colony Count NO GROWTH   Final    Culture NO GROWTH   Final    Report Status 06/11/2012 FINAL   Final   URINE CULTURE     Status: Normal   Collection Time   06/16/12  9:35 AM      Component Value Range Status Comment   Specimen Description URINE, CATHETERIZED   Final    Special Requests NONE   Final    Culture  Setup Time 06/16/2012 13:24   Final    Colony Count >=100,000 COLONIES/ML   Final    Culture     Final    Value: PSEUDOMONAS AERUGINOSA     Note: PTZ TO FOLLOW   Report Status 06/19/2012 FINAL   Final    Organism ID, Bacteria PSEUDOMONAS AERUGINOSA   Final   MRSA PCR SCREENING     Status: Normal   Collection Time   06/19/12 12:27 AM      Component Value Range Status Comment   MRSA by PCR NEGATIVE  NEGATIVE Final      Studies:  Recent x-ray studies have been reviewed in detail by the Attending Physician  Scheduled Meds:  Reviewed in detail by the Attending Physician   Junious Silk,  ANP [facility] Office  [phone] Pager [phone]  On-Call/Text Page:      [email or web address]      password [email or web address]  If 7PM-7AM, please contact night-coverage [email or web address] Password [email or web address] 06/19/2012, 2:58 PM   LOS: 1 day   I have examined the patient, reviewed the chart and modified the above note which I agree with.  Calvert Cantor, MD [phone]

## 2012-06-19 NOTE — Consult Note (Signed)
Reason for Consult:  Subdural hematoma  Referring Physician: Gwyneth Sprout, M.D. and Charlestine Night, [place] (EDP and ED PA)  Nicole Harding is an 78 y.o. female.   HPI: Patient is an 78 year old white female who was brought from an assisted living facility to the [facility] emergency room last night after having been found by her family to be unresponsive all day yesterday at the ALF.  History was provided by the patient's husband Remigio Eisenmenger and her grandson Kipp Brood. Patient's history is notable for multiple medical problems, which have increased over the past month. History is notable for frequent diarrhea and bowel urgency for over a year, as well as frequent nausea and occasionally associated vomiting, again for over a year. They do not report any workup by her primary physician Dr. Ronny Flurry, nor gastroenterology consultation. She also has a history of hypertension, hyperlipidemia, and chronic renal insufficiency.   The family further reports that about 3-4 weeks ago she fell at home. About a week later she was complaining of some torso pain, and her husband took her to urgent care at [facility] where they suspected a pulmonary embolus, and had her transferred to [facility] emergency room. Patient was admitted to the hospitalist service, and V/Q scan was interpreted as "intermediate probability for pulmonary embolus" and the patient was treated with heparin, and converted to Coumadin, and subsequently discharged to an assisted living facility. During that hospitalization the patient was seen by Dr. Willis Modena in gastroenterology consultation.  The family further reports that the patient fell at the ALF 5 days ago, and again 3 days ago. 3 days ago she was brought to the [facility] emergency room and evaluated by Dr. Lorre Nick. He treated the left forehead laceration. Further workup performed by Dr. Freida Busman revealed a urinary tract infection, with a WBC of  11.3 and a urinalysis which showed a large leukocyte, WBC TNTC, and many bacteria. Urine culture showed greater than 100,000 Pseudomonas. He treated the UTI with Rocephin, and started her on Keflex. Further he obtained laboratories which revealed hyponatremia (sodium 130) and hypokalemia (3.0), and also obtained a CT scan of the head which was interpreted as "No acute intracranial abnormality", however in fact it shows a thin left hemispheric acute subdural hematoma. Cannot find any evidence that electrolyte abnormalities were addressed. The patient was subsequently returned to the ALF.  Family further reports that when they visited her yesterday at the ALF she would not respond for any other family members, and she was subsequent taken to the [facility] emergency room and evaluated by Dr. Gwyneth Sprout and Charlestine Night PA. Workup revealed a sodium of 124, potassium 3.1, WBC of 18.7, and a urinalysis to continues to show moderate leukocyte, WBC 21-50, and few bacteria. Repeat urine culture is pending. Further workup included a CT scan of the head which was interpreted as "Acute left subdural hematoma in the frontotemporal region. This is increased since the prior study on 11/09." Neurosurgical consultation was requested.  I reviewed the history and the CT scan images, and discussed the case at length with Mr. Lawyer. I recommended prompt reversal of her anticoagulation, and admission to the hospitalist service, so as to address the multiple significant medical problems confronting this patient. I further recommended transfer to [facility] so as to allow for neurosurgical care, in conjunction with the hospitalist care. I subsequently spoke with Dr. Lyda Perone, the admitting hospitalist, and explained that I do not anticipate, at this time,  a need for neurosurgical intervention. However I explained that the patient's anticoagulation needs to be fully reversed, and I feel that the hospitalist service  needs to assess whether the patient will require placement of an IVC filter to help reduce the risk of future pulmonary embolus. Further I stressed the importance of correction of her hyponatremia and hypokalemia, as well as addressing her UTI. I explained that her altered mental status is most primarily due to her electrolyte and septic derangements, rather than the thin subdural hematoma, which is not causing any significant mass effect.  Symptomatically at this point, the patient denies headache, nausea, or other complaints.    Past Medical History:  Past Medical History  Diagnosis Date  . Hypertension   . History of orthostatic hypotension   . IBS (irritable bowel syndrome)   . Parkinson's disease     ATYPICAL  . Dizziness   . Vertigo   . Fatigue   . Advanced age   . HLD (hyperlipidemia)     intolerant to lipid lowering drugs  . Normal nuclear stress test 2003  . Fall at home 05/2012    mechanical fall  (06/01/2012)  . Pulmonary embolism 05/31/2012  . Chronic kidney disease, stage 4 (severe)   . Exertional dyspnea   . Arthritis     "legs, fingers" (06/01/2012)    Past Surgical History:  Past Surgical History  Procedure Date  . Small intestine surgery   . Hemorrhoid surgery 1970    "have4 had accidents ever since" (06/01/2012)  . Abdominal hysterectomy 1970's  . Tonsillectomy 1954  . Cholecystectomy 1955?  Marland Kitchen Breast cyst excision 1970's    left  . Cataract extraction w/ intraocular lens  implant, bilateral ? 1990's  . Shoulder open rotator cuff repair 1990?    right    Family History:  Family History  Problem Relation Age of Onset  . Stroke Father     Social History:  reports that she quit smoking about 41 years ago. Her smoking use included Cigarettes. She has a 20 pack-year smoking history. She has never used smokeless tobacco. She reports that she does not drink alcohol or use illicit drugs.  Allergies:  Allergies  Allergen Reactions  . Amlodipine Nausea  And Vomiting and Swelling    Swelling --sick and ended up in hospital N&V  . Codeine Nausea And Vomiting  . Lipitor (Atorvastatin Calcium) Other (See Comments)    "cramps my legs"  . Lisinopril Other (See Comments)    unknown  . Red Yeast Rice Other (See Comments)    unknown  . Zetia (Ezetimibe) Other (See Comments)    unknown    Medications: I have reviewed the patient's current medications.  Results for orders placed during the hospital encounter of 06/18/12 (from the past 48 hour(s))  URINALYSIS, ROUTINE W REFLEX MICROSCOPIC     Status: Abnormal   Collection Time   06/18/12  6:29 PM      Component Value Range Comment   Color, Urine YELLOW  YELLOW    APPearance CLOUDY (*) CLEAR    Specific Gravity, Urine 1.021  1.005 - 1.030    pH 5.5  5.0 - 8.0    Glucose, UA NEGATIVE  NEGATIVE mg/dL    Hgb urine dipstick NEGATIVE  NEGATIVE    Bilirubin Urine NEGATIVE  NEGATIVE    Ketones, ur NEGATIVE  NEGATIVE mg/dL    Protein, ur 30 (*) NEGATIVE mg/dL    Urobilinogen, UA 0.2  0.0 - 1.0 mg/dL  Nitrite NEGATIVE  NEGATIVE    Leukocytes, UA MODERATE (*) NEGATIVE   URINE MICROSCOPIC-ADD ON     Status: Abnormal   Collection Time   06/18/12  6:29 PM      Component Value Range Comment   Squamous Epithelial / LPF RARE  RARE    WBC, UA 21-50  <3 WBC/hpf    Bacteria, UA FEW (*) RARE    Urine-Other MUCOUS PRESENT     CBC WITH DIFFERENTIAL     Status: Abnormal   Collection Time   06/18/12  7:40 PM      Component Value Range Comment   WBC 18.7 (*) 4.0 - 10.5 K/uL    RBC 3.44 (*) 3.87 - 5.11 MIL/uL    Hemoglobin 10.4 (*) 12.0 - 15.0 g/dL    HCT 47.8 (*) 29.5 - 46.0 %    MCV 86.9  78.0 - 100.0 fL    MCH 30.2  26.0 - 34.0 pg    MCHC 34.8  30.0 - 36.0 g/dL    RDW 62.1  30.8 - 65.7 %    Platelets 517 (*) 150 - 400 K/uL    Neutrophils Relative 90 (*) 43 - 77 %    Neutro Abs 16.8 (*) 1.7 - 7.7 K/uL    Lymphocytes Relative 5 (*) 12 - 46 %    Lymphs Abs 0.9  0.7 - 4.0 K/uL    Monocytes  Relative 5  3 - 12 %    Monocytes Absolute 0.9  0.1 - 1.0 K/uL    Eosinophils Relative 0  0 - 5 %    Eosinophils Absolute 0.0  0.0 - 0.7 K/uL    Basophils Relative 0  0 - 1 %    Basophils Absolute 0.0  0.0 - 0.1 K/uL   COMPREHENSIVE METABOLIC PANEL     Status: Abnormal   Collection Time   06/18/12  7:40 PM      Component Value Range Comment   Sodium 124 (*) 135 - 145 mEq/L    Potassium 3.1 (*) 3.5 - 5.1 mEq/L    Chloride 88 (*) 96 - 112 mEq/L    CO2 19  19 - 32 mEq/L    Glucose, Bld 84  70 - 99 mg/dL    BUN 33 (*) 6 - 23 mg/dL    Creatinine, Ser 8.46 (*) 0.50 - 1.10 mg/dL    Calcium 8.5  8.4 - 96.2 mg/dL    Total Protein 6.5  6.0 - 8.3 g/dL    Albumin 2.7 (*) 3.5 - 5.2 g/dL    AST 35  0 - 37 U/L    ALT 20  0 - 35 U/L    Alkaline Phosphatase 67  39 - 117 U/L    Total Bilirubin 0.6  0.3 - 1.2 mg/dL    GFR calc non Af Amer 24 (*) >90 mL/min    GFR calc Af Amer 27 (*) >90 mL/min   LACTIC ACID, PLASMA     Status: Normal   Collection Time   06/18/12  7:40 PM      Component Value Range Comment   Lactic Acid, Venous 0.7  0.5 - 2.2 mmol/L   TROPONIN I     Status: Normal   Collection Time   06/18/12  7:40 PM      Component Value Range Comment   Troponin I <0.30  <0.30 ng/mL   PREPARE FRESH FROZEN PLASMA     Status: Normal (Preliminary result)   Collection Time  06/18/12  9:00 PM      Component Value Range Comment   Unit Number [id number]      Blood Component Type THAWED PLASMA      Unit division 00      Status of Unit ISSUED      Transfusion Status OK TO TRANSFUSE      Unit Number [id number]      Blood Component Type THAWED PLASMA      Unit division 00      Status of Unit ALLOCATED      Transfusion Status OK TO TRANSFUSE     PROTIME-INR     Status: Abnormal   Collection Time   06/18/12  9:35 PM      Component Value Range Comment   Prothrombin Time 19.7 (*) 11.6 - 15.2 seconds    INR 1.73 (*) 0.00 - 1.49   MRSA PCR SCREENING     Status: Normal   Collection Time    06/19/12 12:27 AM      Component Value Range Comment   MRSA by PCR NEGATIVE  NEGATIVE   PREPARE FRESH FROZEN PLASMA     Status: Normal (Preliminary result)   Collection Time   06/19/12  2:30 AM      Component Value Range Comment   Unit Number [id number]      Blood Component Type THAWED PLASMA      Unit division 00      Status of Unit ISSUED      Transfusion Status OK TO TRANSFUSE     SODIUM, URINE, RANDOM     Status: Normal   Collection Time   06/19/12  2:40 AM      Component Value Range Comment   Sodium, Ur 50     PROTIME-INR     Status: Abnormal   Collection Time   06/19/12  5:05 AM      Component Value Range Comment   Prothrombin Time 17.5 (*) 11.6 - 15.2 seconds    INR 1.48  0.00 - 1.49   BASIC METABOLIC PANEL     Status: Abnormal   Collection Time   06/19/12  5:30 AM      Component Value Range Comment   Sodium 130 (*) 135 - 145 mEq/L    Potassium 3.1 (*) 3.5 - 5.1 mEq/L    Chloride 93 (*) 96 - 112 mEq/L    CO2 22  19 - 32 mEq/L    Glucose, Bld 86  70 - 99 mg/dL    BUN 32 (*) 6 - 23 mg/dL    Creatinine, Ser 5.28 (*) 0.50 - 1.10 mg/dL    Calcium 8.3 (*) 8.4 - 10.5 mg/dL    GFR calc non Af Amer 26 (*) >90 mL/min    GFR calc Af Amer 30 (*) >90 mL/min   CBC     Status: Abnormal   Collection Time   06/19/12  5:30 AM      Component Value Range Comment   WBC 13.5 (*) 4.0 - 10.5 K/uL    RBC 2.82 (*) 3.87 - 5.11 MIL/uL    Hemoglobin 8.6 (*) 12.0 - 15.0 g/dL    HCT 41.3 (*) 24.4 - 46.0 %    MCV 88.3  78.0 - 100.0 fL    MCH 30.5  26.0 - 34.0 pg    MCHC 34.5  30.0 - 36.0 g/dL    RDW 01.0  27.2 - 53.6 %    Platelets 381  150 - 400 K/uL  Ct Head Wo Contrast  06/18/2012  *RADIOLOGY REPORT*  Clinical Data: Altered mental status.  Recent falls.  CT HEAD WITHOUT CONTRAST  Technique:  Contiguous axial images were obtained from the base of the skull through the vertex without contrast.  Comparison: 06/16/2012.  Findings: There is an acute left subdural hematoma extending  over the left frontotemporal region.  Maximum thickness of 5 mm.  No underlying skull fracture. Slight mass effect on the underlying brain.  Left frontal scalp hematoma persists. This subdural hematoma is more obvious than on the prior study where it was only 2 mm thick.  Advanced atrophy with chronic microvascular ischemic change. Numerous lacunes.  No subarachnoid or intraventricular blood.  No acute sinus or mastoid fluid.  IMPRESSION: Acute left subdural hematoma in the frontotemporal region.  This is increased since the prior study on 11/09.  Critical Value/emergent results were called by telephone at the time of interpretation on 06/18/2012 at 2000 hours to ordering provider, who verbally acknowledged these results.   Original Report Authenticated By: Davonna Belling, M.D.    Dg Chest Port 1 View  06/18/2012  *RADIOLOGY REPORT*  Clinical Data: Altered mental status and weakness.  PORTABLE CHEST - 1 VIEW  Comparison: 06/05/2012  Findings: Numerous leads and wires project over the chest.  Patient rotated to the right. Normal heart size.  No pleural effusion or pneumothorax.  Clear lungs.  IMPRESSION: No acute cardiopulmonary disease.   Original Report Authenticated By: Jeronimo Greaves, M.D.     Physical examination: Patient is an elderly white female, resting peacefully in bed, in no acute distress. Patient is a small dressing over the left brow, where she suffered a small laceration when she fell 3 days ago.  Blood pressure 148/59, pulse 65, temperature 97.4 F (36.3 C), temperature source Oral, resp. rate 15, height 5\' 5"  (1.651 m), weight 67.4 kg (148 lb 9.4 oz), SpO2 96.00%.  Mental status examination: Patient requires moderate stimulation to arouse, but then opens eyes to voice. Patient is oriented to name, [facility], and 2013. Speech is fluent. She follows commands. Cranial nerve examination: Pupils are 3.5 mm bilaterally, round and reactive to light. Extraocular movements are intact. Facial  movement is symmetrical. Tongue is midline. Motor examination: Symmetrical good strength in all 4 extremities. Sensory examination: Senses pinprick throughout. Reflex examination: Trace to one in the upper and lower extremities, symmetrical. Toes downgoing. Gait and stance examination: Not tested due to the patient's condition.   Assessment/Plan: Elderly female readmitted to the hospitalist service with multiple medical problems including recent pulmonary embolus, UTI, significant hyponatremia and hypokalemia, chronic renal insufficiency, multiple falls, and a thin left hemispheric subdural hematoma. Patient had been anticoagulated with Coumadin for the recent pulmonary embolus, the anticoagulation is being reversed.   Patient presented with altered mental status, which I feel is primarily due to her medical arrangements (urosepsis and hyponatremia) , rather than the subdural hematoma, which does not cause any significant mass effect.  There is no indication at this time for neurosurgical intervention for the subdural hematoma, rather we will continue to monitor it with serial CTs. A followup CT has been ordered for the morning.  I've spoken at the bedside at length with the patient's husband Remigio Eisenmenger and her grandson Kipp Brood about her condition from a neurosurgical perspective, and the numerous medical issues confronting her. I explained I will be (along with my partners) managing the neurosurgical aspects of her care, but all of the medical issues confronting her will be addressed  by the hospitalist team. I explained that the SDH will likely resolve on its own, but there is a small chance of an evolving into a chronic subdural hematoma, that could require surgical intervention. There questions regarding her condition and her plans for treatment and care from a neurosurgical perspective were answered for them.   Hewitt Shorts, MD 06/19/2012, 7:32 AM

## 2012-06-19 NOTE — Progress Notes (Addendum)
Transfer quick note:  Pt transferred from [facility] with a SDH 2/2 fall. Her mental status has improved since admission, although she is sometimes slow to answer questions and confused at times. Her neuro exam is stable. VSS. Pt with coagulopathy on admission. Anticoags on hold and pt has had Vit K and is presently receiving her 2nd U of FFP. INR at 0500.  Pt sleeping. Husband updated at bedside. Will cont to follow.  Maren Reamer, NP [facility]

## 2012-06-19 NOTE — Progress Notes (Signed)
Utilization review completed.  

## 2012-06-19 NOTE — Progress Notes (Addendum)
Clinical Social Worker (CSW) spoke with pt daughter Nicole Harding over the phone. CSW provided active listening as daughter expressed frustration with her mother falling at the facility. CSW provided empathy and validated feelings of uncertainty whether pt should return to the facility. CSW did encourage daughter to address concerns with the Wellsite geologist at [facility] CSW and Nicole Harding explored different options such as 24hr care at home, 24hr care at the facility or pt moving to a facility near [place] home in [place]. Nicole Harding stated she nor her parents are able to pay privately and unfortunately pt spouse is not agreeable to move to [facility] where Nicole Harding is able to care for pt and spouse. With much discussion Nicole Harding stated that she will be agreeable for pt to return to [facility] at dc. CSW has left a message for facility representative as pt spouse has already removed pt items from the facility. CSW to facilitate with dc planning and continue provide support to pt family.  Nicole Harding, MSW, Nicole Majors [phone]

## 2012-06-19 NOTE — Evaluation (Signed)
Clinical/Bedside Swallow Evaluation Patient Details  Name: Nicole Harding MRN: [id number] Date of Birth: February 13, 1924  Today's Date: 06/19/2012 Time: 1020-1043 SLP Time Calculation (min): 23 min  Past Medical History:  Past Medical History  Diagnosis Date  . Hypertension   . History of orthostatic hypotension   . IBS (irritable bowel syndrome)   . Parkinson's disease     ATYPICAL  . Dizziness   . Vertigo   . Fatigue   . Advanced age   . HLD (hyperlipidemia)     intolerant to lipid lowering drugs  . Normal nuclear stress test 2003  . Fall at home 05/2012    mechanical fall  (06/01/2012)  . Pulmonary embolism 05/31/2012  . Chronic kidney disease, stage 4 (severe)   . Exertional dyspnea   . Arthritis     "legs, fingers" (06/01/2012)   Past Surgical History:  Past Surgical History  Procedure Date  . Small intestine surgery   . Hemorrhoid surgery 1970    "have4 had accidents ever since" (06/01/2012)  . Abdominal hysterectomy 1970's  . Tonsillectomy 1954  . Cholecystectomy 1955?  Marland Kitchen Breast cyst excision 1970's    left  . Cataract extraction w/ intraocular lens  implant, bilateral ? 1990's  . Shoulder open rotator cuff repair 1990?    right   HPI:  Elderly female readmitted to the hospitalist service with multiple medical problems including recent pulmonary embolus, UTI, significant hyponatremia and hypokalemia, chronic renal insufficiency, multiple falls, and a thin left hemispheric subdural hematoma. Patient had been anticoagulated with Coumadin for the recent pulmonary embolus, the anticoagulation is being reversed.  Neurosurgery following - patient presented with altered mental status, primarily due to her medical arrangements (urosepsis and hyponatremia) , rather than the subdural hematoma, which does not cause any significant mass effect.     Assessment / Plan / Recommendation Clinical Impression  Pt presents with functional oropharyngeal swallow - no cranial  nerve deficits present; adequate, though slowed mastication/oral preparation; prompt swallow trigger; no evidence of airway compromise.  Pt does present with decreased initiation of motor and speech responses; after prompting during course of eval, response time improved. No further swallow f/u warranted.             Diet Recommendation Regular;Thin liquid   Liquid Administration via: Cup Medication Administration: Whole meds with liquid Supervision: Patient able to self feed (with assist) Postural Changes and/or Swallow Maneuvers: Seated upright 90 degrees    Other  Recommendations Oral Care Recommendations: Oral care BID   Follow Up Recommendations    none.    Rob Mciver L. Samson Frederic, MA CCC/SLP Pager [phone]   Nicole Harding 06/19/2012,10:53 AM

## 2012-06-19 NOTE — Care Management Note (Signed)
    Page 1 of 1   06/19/2012     11:33:03 AM   CARE MANAGEMENT NOTE 06/19/2012  Patient:  Nicole Harding, Nicole Harding   Account Number:  [id number]  Date Initiated:  06/19/2012  Documentation initiated by:  Donn Pierini  Subjective/Objective Assessment:   Pt admitted with AMS- scans show acute SDH     Action/Plan:   PTA pt was at [facility]- CSW to follow for placement needs   Anticipated DC Date:  06/22/2012   Anticipated DC Plan:  SKILLED NURSING FACILITY  In-house referral  Clinical Social Worker         Choice offered to / List presented to:             Status of service:  In process, will continue to follow Medicare Important Message given?   (If response is "NO", the following Medicare IM given date fields will be blank) Date Medicare IM given:   Date Additional Medicare IM given:    Discharge Disposition:    Per UR Regulation:  Reviewed for med. necessity/level of care/duration of stay  If discussed at Long Length of Stay Meetings, dates discussed:    Comments:

## 2012-06-19 NOTE — H&P (Signed)
Cc:  PE with SDH on coumadin therapy.  Patient in need of IVC filter placement - permanent.  HPI : see hospitalist's note below :    Russella Dar, NP Nurse Practitioner [facility] Needed Internal Medicine Progress Notes 06/19/2012 2:57 PM  [facility] Progress Note [place] TEAM 1 - Stepdown/ICU TEAM   Nicole Harding ZOX:[id number] DOB: 1923/10/23 DOA: 06/18/2012 PCP: [name], MD  Brief narrative: 78 year old female who was sent to the hospital because of altered mental status for 24 hours. This was associated with vomiting. Patient normally resides at an assisted living facility and when the family went to visit her they found her to be unresponsive. 2 days prior she had fallen and was evaluated in the ER and CT of the head done during that visit demonstrated no evidence of subdural hematoma but she did have a urinary tract infection. Important to note that the patient prior to admission was on Coumadin for new diagnosis of DVT with pulmonary embolus diagnosed 2 weeks prior. CT scan was repeated after arrival to the emergency department and this demonstrated a new acute tiny subdural hematoma. She was subsequently admitted to the step down unit for further monitoring and treatment. After the decision was made to admit the patient the admitting physician documents that he did have a conversation with the neurosurgeon who felt the patient currently was not a candidate for acute neurosurgery recommended the patient be transferred to [facility] to monitor for evolving subdural hematoma that may require surgical intervention.  Assessment/Plan: Principal Problem:  *SDH (subdural hematoma) *Secondary to fall in the setting of chronic anticoagulation *Anticoagulation has been reversed and Coumadin has been discontinued indefinitely *Hematoma is very small and followup CT scan of head is pending for 06/20/2012 *Appreciate neurosurgical assistance  Active Problems:  Metabolic encephalopathy *Likely related to recent UTI *Family also endorses after last discharge several weeks ago patient had been started on new medications Zoloft and oxybutynin and since that time they have noted the patient's mental status has not been at her baseline so we will discontinue these medications and follow response   Sepsis due to Pseudomonas/UTI (lower urinary tract infection) due to pseudomonas *Currently hemodynamically stable *Continue Cipro based on urine culture results  History of pulmonary embolism/Warfarin anticoagulation *Anticoagulation has been reversed but INR remains slightly elevated so we'll repeat PT/INR today *Discuss with neurosurgery/Dr. Newell Coral. Recommendation is that patient will need to have Coumadin held at least 2-3 months because of subdural hematoma. She also has a history of recurrent falls and does not appear to be an appropriate candidate for future anticoagulation   *Plan IVC filter placement-I have contacted interventional radiology   Benign hypertensive heart disease without heart failure *Blood pressure moderately controlled *Continue hydralazine and metoprolol   CKD (chronic kidney disease) stage 4, GFR 15-29 ml/min *Creatinine just above baseline of around 1.6-2.1   Fall-recurrent *Was resident of assisted living facility prior to admission *Once encephalopathy improves will need to have formal PT OT evaluation in the event patient needs to upgrade to skilled   DVT prophylaxis: SCDs-cannot anticoagulate because of acute subdural hematoma Code Status: Full Family Communication: Spoke to patient and family Disposition Plan: Remain in step down  Consultants: Neurosurgery  Procedures: IVC filter placement pending  Antibiotics: Cipro 11/11 >>>  HPI/Subjective: Patient was extremely lethargic earlier this morning but was arousable and able to follow simple commands easily.  Objective: Blood pressure 157/60, pulse 58,  temperature 97.8 F (36.6 C), temperature source Oral,  resp. rate 16, height 5\' 5"  (1.651 m), weight 67.4 kg (148 lb 9.4 oz), SpO2 98.00%.  Intake/Output Summary (Last 24 hours) at 06/19/12 1458 Last data filed at 06/19/12 1200   Gross per 24 hour   Intake  3302.5 ml   Output     580 ml   Net  2722.5 ml     Exam: General: No acute respiratory distress HEENT: Left eye with periorbital ecchymosis otherwise exam unremarkable Lungs: Clear to auscultation bilaterally without wheezes or crackles, nasal cannula oxygen Cardiovascular: Regular rate and rhythm without murmur gallop or rub normal S1 and S2, or peripheral edema Abdomen: Nontender, nondistended, soft, bowel sounds positive, no rebound, no ascites, no appreciable mass Musculoskeletal: No significant cyanosis, clubbing of bilateral lower extremities Neurological: Patient is very lethargic but is arousable and opens eyes, pupils are equal and react to light, follows simple commands  Data Reviewed: Basic Metabolic Panel:  Lab  06/19/12 0530  06/18/12 1940  06/16/12 0819   NA  130*  124*  130*   K  3.1*  3.1*  3.0*   CL  93*  88*  92*   CO2  22  19  23    GLUCOSE  86  84  96   BUN  32*  33*  42*   CREATININE  1.73*  1.84*  2.09*   CALCIUM  8.3*  8.5  8.4   MG  --  --  --   PHOS  --  --  --    Liver Function Tests:  Lab  06/18/12 1940  06/16/12 0819   AST  35  30   ALT  20  19   ALKPHOS  67  64   BILITOT  0.6  0.4   PROT  6.5  6.4   ALBUMIN  2.7*  2.7*    No results found for this basename: LIPASE:5,AMYLASE:5 in the last 168 hours No results found for this basename: AMMONIA:5 in the last 168 hours CBC:  Lab  06/19/12 0530  06/18/12 1940  06/16/12 0819   WBC  13.5*  18.7*  11.3*   NEUTROABS  --  16.8*  9.5*   HGB  8.6*  10.4*  10.8*   HCT  24.9*  29.9*  32.4*   MCV  88.3  86.9  89.8   PLT  381  517*  498*    Cardiac Enzymes:  Lab  06/18/12 1940   CKTOTAL  --   CKMB  --   CKMBINDEX  --   TROPONINI  <0.30     BNP (last 3 results) No results found for this basename: PROBNP:3 in the last 8760 hours CBG:  Lab  06/19/12 0851   GLUCAP  90       Recent Results (from the past 240 hour(s))   URINE CULTURE     Status: Normal     Collection Time     06/09/12 11:13 PM       Component  Value  Range  Status  Comment     Specimen Description  URINE, CATHETERIZED     Final       Special Requests  NONE     Final       Culture  Setup Time  06/10/2012 15:59     Final       Colony Count  NO GROWTH     Final       Culture  NO GROWTH     Final  Report Status  06/11/2012 FINAL     Final     URINE CULTURE     Status: Normal     Collection Time     06/16/12  9:35 AM       Component  Value  Range  Status  Comment     Specimen Description  URINE, CATHETERIZED     Final       Special Requests  NONE     Final       Culture  Setup Time  06/16/2012 13:24     Final       Colony Count  >=100,000 COLONIES/ML     Final       Culture        Final       Value:  PSEUDOMONAS AERUGINOSA        Note: PTZ TO FOLLOW     Report Status  06/19/2012 FINAL     Final       Organism ID, Bacteria  PSEUDOMONAS AERUGINOSA     Final     MRSA PCR SCREENING     Status: Normal     Collection Time     06/19/12 12:27 AM       Component  Value  Range  Status  Comment     MRSA by PCR  NEGATIVE   NEGATIVE  Final        Studies:  Recent x-ray studies have been reviewed in detail by the Attending Physician  Scheduled Meds:  Reviewed in detail by the Attending Physician  Junious Silk, ANP [facility] Office  [phone] Pager [phone]  On-Call/Text Page:      [email or web address]      password [email or web address]  If 7PM-7AM, please contact night-coverage [email or web address] Password [email or web address] 06/19/2012, 2:58 PM  LOS: 1 day     PE:  Patient awake, pleasantly confused, follows most commands.  Contusion to left forehead.    Resp: CTA bilaterally. [place] oxygen in place CV: RRR with no murmurs, rubs or gallops Airway : 2  A/P: Patient  with PE, DVT on coumadin, fell while on anticoagulation, now with SDH in need of permanent IVC filter placement. Procedure details, benefits and risks discussed with spouse in detail with all his questions answered to his satisfaction.  Written consent obtained.  Will recheck am BMET - likely C02 guided filter placement given renal insufficiency.

## 2012-06-19 NOTE — Progress Notes (Signed)
Pt arrived to 3306 from [facility].  Lethargic, oriented to self and place, disoriented to time and situation.  Pupils 2 equal and reactive.  Able to follow commands.  Paged neurosurgery per MD order.  Family at bedside awaiting neurosurgery MD.  Will continue to monitor.    Maximino Greenland RN

## 2012-06-19 NOTE — Progress Notes (Signed)
Clinical Child psychotherapist (CSW) visited pt room and met with pt "Nicole Harding" Yvonne Kendall [phone]. Amada Jupiter seemed involved in pt care and familiar with pt hx and stay at [facility]. Amada Jupiter informed CSW that pt husband and pt daughter would be the best contacts for CSW. CSW spoke with pt spouse who informed CSW that he would prefer a higher level of care for pt. CSW informed pt spouse that at this moment pt does not appear to qualify for an LTACH. Pt spouse stated he would then be agreeable to pt returning to [facility] however preferred that CSW contact pt daughter Lupita Leash to confirm dc plans. CSW has left a message for Lupita Leash and will await a returned call.  Theresia Bough, MSW, Theresia Majors [phone]'

## 2012-06-19 NOTE — ED Provider Notes (Signed)
Medical screening examination/treatment/procedure(s) were conducted as a shared visit with non-physician practitioner(s) and myself.  I personally evaluated the patient during the encounter  Pt with mechanical fall and initial CT neg but now 2 days later with AMS.  Pt on coumadin for PE and now has subdural hematoma.  NSU aware, admit to [facility].  Reversed with FFP and Vit K  CRITICAL CARE Performed by: Gwyneth Sprout   Total critical care time: 30  Critical care time was exclusive of separately billable procedures and treating other patients.  Critical care was necessary to treat or prevent imminent or life-threatening deterioration.  Critical care was time spent personally by me on the following activities: development of treatment plan with patient and/or surrogate as well as nursing, discussions with consultants, evaluation of patient's response to treatment, examination of patient, obtaining history from patient or surrogate, ordering and performing treatments and interventions, ordering and review of laboratory studies, ordering and review of radiographic studies, pulse oximetry and re-evaluation of patient's condition.   Gwyneth Sprout, MD 06/19/12 [phone]

## 2012-06-20 ENCOUNTER — Inpatient Hospital Stay (HOSPITAL_COMMUNITY): Payer: Medicare Other

## 2012-06-20 LAB — PREPARE FRESH FROZEN PLASMA
Unit division: 0
Unit division: 0

## 2012-06-20 LAB — PROTIME-INR
INR: 1.24 (ref 0.00–1.49)
Prothrombin Time: 15.4 seconds — ABNORMAL HIGH (ref 11.6–15.2)

## 2012-06-20 LAB — URINE CULTURE

## 2012-06-20 MED ORDER — ACETAMINOPHEN 325 MG PO TABS: 650.0000 mg | ORAL_TABLET | Freq: Four times a day (QID) | ORAL | Status: DC | PRN

## 2012-06-20 MED ORDER — OXYCODONE HCL 5 MG PO TABS: 5.0000 mg | ORAL_TABLET | ORAL | Status: DC | PRN

## 2012-06-20 MED ORDER — MIDAZOLAM HCL 2 MG/2ML IJ SOLN
INTRAMUSCULAR | Status: AC | PRN
  Administered 2012-06-20: 1 mg via INTRAVENOUS

## 2012-06-20 MED ORDER — POTASSIUM CHLORIDE CRYS ER 20 MEQ PO TBCR
40.0000 meq | EXTENDED_RELEASE_TABLET | Freq: Every day | ORAL | Status: DC
  Administered 2012-06-20 – 2012-06-25 (×6): 40 meq via ORAL
  Filled 2012-06-20 (×5): qty 2

## 2012-06-20 MED ORDER — BACITRACIN-NEOMYCIN-POLYMYXIN OINTMENT TUBE
TOPICAL_OINTMENT | Freq: Every day | CUTANEOUS | Status: DC
  Administered 2012-06-20 – 2012-06-25 (×5): via TOPICAL
  Filled 2012-06-20 (×3): qty 15

## 2012-06-20 MED ORDER — FENTANYL CITRATE 0.05 MG/ML IJ SOLN
INTRAMUSCULAR | Status: AC | PRN
  Administered 2012-06-20: 25 ug via INTRAVENOUS

## 2012-06-20 NOTE — Progress Notes (Signed)
Clinical Child psychotherapist (CSW) spoke with Jasmine December at [employer] and informed her of family desire for pt to return when medically stable. CSW also shared concerns that family has regarding pt falls. Jasmine December was very apologetic and informed CSW that the medical director would be contacting pt daughter to better understand their concerns. CSW informed pt daughter Lupita Leash of the above. Lupita Leash was very appreciative and stated she had no questions or concerns.  Pt will return to [facility] when medically ready.  Theresia Bough, MSW, Theresia Majors [phone]

## 2012-06-20 NOTE — Progress Notes (Signed)
Subjective: Patient without new difficulties. CT scan brain without contrast this morning shows essentially no change in the size of the left hemispheric acute subdural hematoma. It continues to have no significant mass effect.  Objective: Vital signs in last 24 hours: Filed Vitals:   06/19/12 2357 06/20/12 0400 06/20/12 0602 06/20/12 0800  BP: 146/56 141/74 150/62 144/52  Pulse: 62 59  52  Temp: 97.9 F (36.6 C) 98.7 F (37.1 C)  98.5 F (36.9 C)  TempSrc: Oral Oral  Oral  Resp: 15 17  15   Height:      Weight:      SpO2: 97% 98%  98%    Intake/Output from previous day: 11/12 0701 - 11/13 0700 In: 3130 [P.O.:480; I.V.:2400; IV Piggyback:250] Out: 1650 [Urine:1650] Intake/Output this shift: Total I/O In: 100 [I.V.:100] Out: 275 [Urine:275]  Physical Exam:   Patient more alert, opening eyes spontaneously, [facility]. Following commands with all 4 extremities. Speech fluent. Pupils equal round reactive to light. Extraocular movements intact. Facial with symmetrical.  CBC  Basename 06/19/12 0530 06/18/12 1940  WBC 13.5* 18.7*  HGB 8.6* 10.4*  HCT 24.9* 29.9*  PLT 381 517*   BMET  Basename 06/19/12 1810 06/19/12 1455  NA 129* 129*  K 3.0* 2.9*  CL 95* 95*  CO2 21 21  GLUCOSE 90 134*  BUN 33* 31*  CREATININE 1.76* 1.70*  CALCIUM 8.1* 8.6    Studies/Results: Ct Head Wo Contrast  06/20/2012  *RADIOLOGY REPORT*  Clinical Data: Follow up SDH  CT HEAD WITHOUT CONTRAST  Technique:  Contiguous axial images were obtained from the base of the skull through the vertex without contrast.  Comparison: 06/18/2012  Findings: Left frontotemporal subdural hematoma measuring 7 mm in thickness (series 2/image 15), grossly unchanged when measured in a similar fashion on the prior study.  No evidence of parenchymal or interventricular hemorrhage.  Mild underlying sulcal effacement.  No midline shift.  No CT evidence of acute infarction.  Subcortical white matter  and periventricular small vessel ischemic changes.  Intracranial atherosclerosis.  Partial opacification of the left sphenoid sinus.  The visualized paranasal sinuses and mastoid air cells are otherwise clear.  No evidence of calvarial fracture.  IMPRESSION: Stable left frontotemporal subdural hematoma measuring 7 mm in thickness.   Original Report Authenticated By: Charline Bills, M.D.    Ct Head Wo Contrast  06/18/2012  *RADIOLOGY REPORT*  Clinical Data: Altered mental status.  Recent falls.  CT HEAD WITHOUT CONTRAST  Technique:  Contiguous axial images were obtained from the base of the skull through the vertex without contrast.  Comparison: 06/16/2012.  Findings: There is an acute left subdural hematoma extending over the left frontotemporal region.  Maximum thickness of 5 mm.  No underlying skull fracture. Slight mass effect on the underlying brain.  Left frontal scalp hematoma persists. This subdural hematoma is more obvious than on the prior study where it was only 2 mm thick.  Advanced atrophy with chronic microvascular ischemic change. Numerous lacunes.  No subarachnoid or intraventricular blood.  No acute sinus or mastoid fluid.  IMPRESSION: Acute left subdural hematoma in the frontotemporal region.  This is increased since the prior study on 11/09.  Critical Value/emergent results were called by telephone at the time of interpretation on 06/18/2012 at 2000 hours to ordering provider, who verbally acknowledged these results.   Original Report Authenticated By: Davonna Belling, M.D.    Dg Chest Port 1 View  06/18/2012  *RADIOLOGY REPORT*  Clinical  Data: Altered mental status and weakness.  PORTABLE CHEST - 1 VIEW  Comparison: 06/05/2012  Findings: Numerous leads and wires project over the chest.  Patient rotated to the right. Normal heart size.  No pleural effusion or pneumothorax.  Clear lungs.  IMPRESSION: No acute cardiopulmonary disease.   Original Report Authenticated By: Jeronimo Greaves, M.D.      Assessment/Plan: Patient more responsive. CT scan brain stable. Hyponatremia (129) and hypokalemia (2.9) persist, and continue to need correction. Spoke with the patient's husband about her condition and her plans for continued neurosurgical followup. He is encouraged by her clinical improvement (more responsive). I discussed the case with Dr. Reather Littler.   Hewitt Shorts, MD 06/20/2012, 11:27 AM

## 2012-06-20 NOTE — Progress Notes (Signed)
Visit w/pt's husband and former son-in-law (friend of the family). Pt was away having a procedure done. Pt's family was very cordial and appreciated the visit and invited me to return for another visit.  Marjory Lies Chaplain

## 2012-06-20 NOTE — Progress Notes (Signed)
[facility] Progress Note [place] TEAM 1 - Stepdown/ICU TEAM   NATALLIE FILL WUJ:[id number] DOB: [birth year] DOA: 06/18/2012 PCP: [name], MD  Brief narrative: 78 year old female who was sent to the hospital because of altered mental status for 24 hours. This was associated with vomiting. Patient normally resides at an assisted living facility and when the family went to visit her they found her to be unresponsive. 2 days prior she had fallen and was evaluated in the ER and CT of the head done during that visit demonstrated no evidence of subdural hematoma but she did have a urinary tract infection. Important to note that the patient prior to admission was on Coumadin for new diagnosis of DVT with pulmonary embolus 2 weeks prior. CT scan was repeated after arrival to the emergency department and this demonstrated a new acute subdural hematoma. She was subsequently admitted to the step down unit for further monitoring and treatment.   Assessment/Plan:  SDH (subdural hematoma) *Secondary to fall in the setting of chronic anticoagulation *Anticoagulation has been reversed and Coumadin has been discontinued indefinitely *Hematoma is very small and followup CT scan of head for 06/20/2012 demonstrates slight increase in size of subdural hematoma from 5 mm to 7 mm *Appreciate neurosurgical assistance  Metabolic encephalopathy *Likely related to recent UTI and medications - see below *Family endorsed that prior to the last discharge, patient had been started on new medications -  Zoloft and oxybutynin - since that time they have noted the patient's mental status has not been at her baseline - we will discontinue these medications *Today the patient is much more alert and according to her husband she is at her baseline  Pseudomonas UTI (>100k colonies via urine cx 06/16/2012) *Currently hemodynamically stable *Continue Cipro based on urine culture results  Hyponatremia -  baseline Na+ for this pt 130-135 *Hold HCTZ, encourage PO liquids and cont IV NS *Repeat labs in a.m. *Na close to baseline - no indication for "overcorrection"   Hypokalemia Due to HCTZ + poor intake - replace and follow  History of pulmonary embolism/Warfarin anticoagulation *Anticoagulation has been reversed and INR 1.24 *Discuss with neurosurgery/Dr. Newell Coral. Recommendation is that patient will need to have Coumadin held at least 2-3 months because of subdural hematoma. She also has a history of recurrent falls and does not appear to be an appropriate candidate for future anticoagulation  *IVC filter placed via interventional radiology 06/20/2012  Benign hypertensive heart disease without heart failure *Blood pressure moderately controlled *Continue hydralazine and metoprolol  CKD (chronic kidney disease) stage 4, GFR 15-29 ml/min *Creatinine just above baseline of around 1.6-2.1  Fall-recurrent *Was resident of assisted living facility prior to admission *Once encephalopathy improves, will need to have formal PT OT evaluation - suspect SNF will be more appropriate for d/c   DVT prophylaxis: SCDs-cannot anticoagulate because of acute subdural hematoma Code Status: Full Family Communication: Spoke to patient and husband Disposition Plan: Remain in step down  Consultants: Neurosurgery  Procedures: 11/13 - IVC filter placement  Antibiotics: Cipro 11/11 >>>  HPI/Subjective: Patient is alert and smiling today - occasionally laughs. Makes appropriate eye contact. Has no complaints.   Objective: Blood pressure 144/52, pulse 52, temperature 97.9 F (36.6 C), temperature source Oral, resp. rate 15, height 5\' 5"  (1.651 m), weight 67.4 kg (148 lb 9.4 oz), SpO2 98.00%.  Intake/Output Summary (Last 24 hours) at 06/20/12 1413 Last data filed at 06/20/12 1200  Gross per 24 hour  Intake   2380 ml  Output   1525 ml  Net    855 ml     Exam: General: No acute respiratory  distress HEENT: Left eye with periorbital ecchymosis and a large left supraorbital contusion, she also has left-sided facial ecchymosis Lungs: Clear to auscultation bilaterally without wheezes or crackles, nasal cannula oxygen Cardiovascular: Regular rate and rhythm without murmur gallop or rub normal S1 and S2, or peripheral edema Abdomen: Nontender, nondistended, soft, bowel sounds positive, no rebound, no ascites, no appreciable mass Musculoskeletal: No significant cyanosis, clubbing of bilateral lower extremities Neurological: Awake and oriented to name and place, moves all extremities x4  Data Reviewed: Basic Metabolic Panel:  Lab 06/19/12 9562 06/19/12 1455 06/19/12 0530 06/18/12 1940 06/16/12 0819  NA 129* 129* 130* 124* 130*  K 3.0* 2.9* 3.1* 3.1* 3.0*  CL 95* 95* 93* 88* 92*  CO2 21 21 22 19 23   GLUCOSE 90 134* 86 84 96  BUN 33* 31* 32* 33* 42*  CREATININE 1.76* 1.70* 1.73* 1.84* 2.09*  CALCIUM 8.1* 8.6 8.3* 8.5 8.4  MG -- -- -- -- --  PHOS -- -- -- -- --   Liver Function Tests:  Lab 06/18/12 1940 06/16/12 0819  AST 35 30  ALT 20 19  ALKPHOS 67 64  BILITOT 0.6 0.4  PROT 6.5 6.4  ALBUMIN 2.7* 2.7*   CBC:  Lab 06/19/12 0530 06/18/12 1940 06/16/12 0819  WBC 13.5* 18.7* 11.3*  NEUTROABS -- 16.8* 9.5*  HGB 8.6* 10.4* 10.8*  HCT 24.9* 29.9* 32.4*  MCV 88.3 86.9 89.8  PLT 381 517* 498*   Cardiac Enzymes:  Lab 06/18/12 1940  CKTOTAL --  CKMB --  CKMBINDEX --  TROPONINI <0.30   CBG:  Lab 06/20/12 0714 06/19/12 0851  GLUCAP 97 90    Recent Results (from the past 240 hour(s))  URINE CULTURE     Status: Normal   Collection Time   06/16/12  9:35 AM      Component Value Range Status Comment   Specimen Description URINE, CATHETERIZED   Final    Special Requests NONE   Final    Culture  Setup Time 06/16/2012 13:24   Final    Colony Count >=100,000 COLONIES/ML   Final    Culture     Final    Value: PSEUDOMONAS AERUGINOSA     Note: PTZ TO FOLLOW   Report  Status 06/19/2012 FINAL   Final    Organism ID, Bacteria PSEUDOMONAS AERUGINOSA   Final   URINE CULTURE     Status: Normal (Preliminary result)   Collection Time   06/18/12  6:29 PM      Component Value Range Status Comment   Specimen Description URINE, CATHETERIZED   Final    Special Requests NONE   Final    Culture  Setup Time 06/19/2012 03:11   Final    Colony Count 40,000 COLONIES/ML   Final    Culture PSEUDOMONAS AERUGINOSA   Final    Report Status PENDING   Incomplete   MRSA PCR SCREENING     Status: Normal   Collection Time   06/19/12 12:27 AM      Component Value Range Status Comment   MRSA by PCR NEGATIVE  NEGATIVE Final   URINE CULTURE     Status: Normal   Collection Time   06/19/12  2:41 AM      Component Value Range Status Comment   Specimen Description URINE, CATHETERIZED   Final    Special Requests NONE  Final    Culture  Setup Time 06/19/2012 08:50   Final    Colony Count 8,000 COLONIES/ML   Final    Culture YEAST   Final    Report Status 06/20/2012 FINAL   Final      Studies:  Recent x-ray studies have been reviewed in detail by the Attending Physician  Scheduled Meds:  Reviewed in detail by the Attending Physician   Junious Silk, ANP [facility] Office  [phone] Pager [phone]  On-Call/Text Page:      [email or web address]      password [email or web address]  If 7PM-7AM, please contact night-coverage [email or web address] Password [email or web address] 06/20/2012, 2:13 PM   LOS: 2 days   I have personally examined this patient and reviewed the entire database. I have reviewed the above note, made any necessary editorial changes, and agree with its content.  Lonia Blood, MD [facility]

## 2012-06-20 NOTE — Procedures (Signed)
Successful placement of an infrarenal IVC filter via right CFV approach. No immediate complications.  

## 2012-06-21 LAB — URINE CULTURE: Colony Count: 40000

## 2012-06-21 LAB — BASIC METABOLIC PANEL
BUN: 28 mg/dL — ABNORMAL HIGH (ref 6–23)
Creatinine, Ser: 1.68 mg/dL — ABNORMAL HIGH (ref 0.50–1.10)
GFR calc Af Amer: 31 mL/min — ABNORMAL LOW (ref >90)
GFR calc non Af Amer: 26 mL/min — ABNORMAL LOW (ref >90)

## 2012-06-21 LAB — CBC
MCH: 30.4 pg (ref 26.0–34.0)
MCHC: 33.1 g/dL (ref 30.0–36.0)
RDW: 13.4 % (ref 11.5–15.5)

## 2012-06-21 LAB — GLUCOSE, CAPILLARY: Glucose-Capillary: 87 mg/dL (ref 70–99)

## 2012-06-21 LAB — PREPARE FRESH FROZEN PLASMA

## 2012-06-21 MED ORDER — CIPROFLOXACIN HCL 500 MG PO TABS
500.0000 mg | ORAL_TABLET | Freq: Every day | ORAL | Status: DC
  Administered 2012-06-21 – 2012-06-25 (×5): 500 mg via ORAL
  Filled 2012-06-21 (×6): qty 1

## 2012-06-21 MED ORDER — HYDRALAZINE HCL 50 MG PO TABS
50.0000 mg | ORAL_TABLET | Freq: Three times a day (TID) | ORAL | Status: DC
  Administered 2012-06-21 – 2012-06-25 (×13): 50 mg via ORAL
  Filled 2012-06-21 (×15): qty 1

## 2012-06-21 NOTE — Progress Notes (Signed)
Pt transferred from [address]. This CSW has informed new unit [address]N CSW of transfer. This CSW signing off.  Theresia Bough, MSW, Theresia Majors [phone]

## 2012-06-21 NOTE — Evaluation (Signed)
Occupational Therapy Evaluation Patient Details Name: Nicole Harding MRN: [id number] DOB: 07-25-1924 Today's Date: 06/21/2012 Time: 1027-2536 OT Time Calculation (min): 35 min  OT Assessment / Plan / Recommendation Clinical Impression  Pt admitted after falling and sustaining L frontal temporal lobe SDH.  Pt was at [facility] for ST rehab prior to this admission for less than a week per her family.  Pt is dependent in ADL and mobility.  Will follow to address deficit areas as described below.    OT Assessment  Patient needs continued OT Services    Follow Up Recommendations  SNF    Barriers to Discharge      Equipment Recommendations  None recommended by OT    Recommendations for Other Services    Frequency  Min 2X/week    Precautions / Restrictions Precautions Precautions: Fall   Pertinent Vitals/Pain No c/o pain.    ADL  Eating/Feeding: Maximal assistance;Other (comment) (friend was feeding pt upon therapist's arrival) Where Assessed - Eating/Feeding: Bed level Grooming: Maximal assistance;Brushing hair Where Assessed - Grooming: Unsupported sitting Upper Body Bathing: Maximal assistance Where Assessed - Upper Body Bathing: Unsupported sitting Lower Body Bathing: +1 Total assistance Where Assessed - Lower Body Bathing: Supported sit to stand Upper Body Dressing: Maximal assistance Where Assessed - Upper Body Dressing: Unsupported sitting Lower Body Dressing: +1 Total assistance Where Assessed - Lower Body Dressing: Supported sit to stand Toileting - Architect and Hygiene: +1 Total assistance Where Assessed - Glass blower/designer Manipulation and Hygiene: Standing Equipment Used: Gait belt Transfers/Ambulation Related to ADLs: ambulated 5 feet with +2 total assist pt 70% with hands held. ADL Comments: Family reports pt was dependent in all ADL at SNF.  Stated she had not walked at [facility].      OT Diagnosis: Generalized weakness;Cognitive deficits  OT  Problem List: Decreased strength;Decreased activity tolerance;Impaired balance (sitting and/or standing);Decreased cognition;Decreased safety awareness;Decreased knowledge of use of DME or AE;Obesity OT Treatment Interventions: Self-care/ADL training;Therapeutic activities;Patient/family education;DME and/or AE instruction   OT Goals Acute Rehab OT Goals OT Goal Formulation: With patient Time For Goal Achievement: 07/05/12 Potential to Achieve Goals: Good ADL Goals Pt Will Perform Eating: with set-up;Sitting, chair ADL Goal: Eating - Progress: Goal set today Pt Will Perform Grooming: with min assist;Standing at sink;Other (comment) (one activity) ADL Goal: Grooming - Progress: Goal set today Pt Will Perform Upper Body Bathing: with min assist;Sitting, edge of bed ADL Goal: Upper Body Bathing - Progress: Goal set today Pt Will Perform Upper Body Dressing: with min assist;Sitting, bed ADL Goal: Upper Body Dressing - Progress: Goal set today Pt Will Transfer to Toilet: with min assist;Ambulation;Comfort height toilet;with DME ADL Goal: Toilet Transfer - Progress: Goal set today Miscellaneous OT Goals Miscellaneous OT Goal #1: Pt will perform bed mobility to EOB with supervision in preparation for ADL. OT Goal: Miscellaneous Goal #1 - Progress: Goal set today  Visit Information  Last OT Received On: 06/21/12 Assistance Needed: +2 PT/OT Co-Evaluation/Treatment: Yes    Subjective Data  Subjective: "I'm feeling tired." Patient Stated Goal: Plans to return for rehab to SNF, unclear if it will be [facility] again.   Prior Functioning     Home Living Available Help at Discharge: Skilled Nursing Facility (pt at SNF for ST rehab) Type of Home: Skilled Nursing Facility Prior Function Level of Independence: Needs assistance Needs Assistance: Bathing;Dressing;Feeding;Grooming;Transfers;Gait Bath: Maximal Dressing: Maximal Feeding: Maximal Grooming:  Maximal Communication Communication: HOH Dominant Hand: Left         Vision/Perception  Cognition  Overall Cognitive Status: History of cognitive impairments - at baseline Arousal/Alertness: Awake/alert Orientation Level: Appears intact for tasks assessed Behavior During Session: [facility] for tasks performed    Extremity/Trunk Assessment Right Upper Extremity Assessment RUE ROM/Strength/Tone: [facility] for tasks assessed Left Upper Extremity Assessment LUE ROM/Strength/Tone: [facility] for tasks assessed     Mobility Bed Mobility Bed Mobility: Supine to Sit;Sitting - Scoot to Edge of Bed Supine to Sit: 3: Mod assist Sitting - Scoot to Edge of Bed: 3: Mod assist Details for Bed Mobility Assistance: verbal cues for hand placement and to assist trunk Transfers Transfers: Sit to Stand;Stand to Sit Sit to Stand: From bed;From chair/3-in-1;1: +2 Total assist Sit to Stand: Patient Percentage: 70% Stand to Sit: 1: +2 Total assist;To chair/3-in-1;To bed Stand to Sit: Patient Percentage: 70%     Shoulder Instructions     Exercise     Balance Balance Balance Assessed: Yes Static Sitting Balance Static Sitting - Balance Support: Feet supported Static Sitting - Level of Assistance: 5: Stand by assistance Static Sitting - Comment/# of Minutes: 10 Static Standing Balance Static Standing - Balance Support: Bilateral upper extremity supported Static Standing - Level of Assistance: 1: +2 Total assist;Patient percentage (comment) (70) Static Standing - Comment/# of Minutes: 2   End of Session OT - End of Session Equipment Utilized During Treatment: Gait belt Activity Tolerance: Patient limited by fatigue Patient left: Other (comment) (w/c for transfer ) Nurse Communication: Mobility status  GO     Evern Bio 06/21/2012, 1:17 PM [phone]

## 2012-06-21 NOTE — Progress Notes (Signed)
Subjective: Patient resting in bed comfortably. Without complaints.  Objective: Vital signs in last 24 hours: Filed Vitals:   06/21/12 0025 06/21/12 0424 06/21/12 0639 06/21/12 0800  BP: 156/54 145/75 147/62   Pulse: 62 60    Temp: 97.9 F (36.6 C) 98.1 F (36.7 C)  97.6 F (36.4 C)  TempSrc: Oral Oral  Oral  Resp: 14 15    Height:      Weight:      SpO2: 96% 95%      Intake/Output from previous day: 11/13 0701 - 11/14 0700 In: 1078 [I.V.:1078] Out: 1525 [Urine:1525] Intake/Output this shift: Total I/O In: -  Out: 251 [Urine:250; Stool:1]  Physical Exam:  Awake, alert, oriented to name, [facility] hospital, and 2013. Pupils equal round react to light. Extraocular movements intact. Facial movements symmetrical. Moving all 4 extremities well. No drift of upper extremities.  CBC  Basename 06/21/12 0435 06/19/12 0530  WBC 9.4 13.5*  HGB 9.5* 8.6*  HCT 28.7* 24.9*  PLT 489* 381   BMET  Basename 06/21/12 0435 06/19/12 1810  NA 129* 129*  K 4.4 3.0*  CL 101 95*  CO2 19 21  GLUCOSE 95 90  BUN 28* 33*  CREATININE 1.68* 1.76*  CALCIUM 8.2* 8.1*    Studies/Results: Ct Head Wo Contrast  06/20/2012  *RADIOLOGY REPORT*  Clinical Data: Follow up SDH  CT HEAD WITHOUT CONTRAST  Technique:  Contiguous axial images were obtained from the base of the skull through the vertex without contrast.  Comparison: 06/18/2012  Findings: Left frontotemporal subdural hematoma measuring 7 mm in thickness (series 2/image 15), grossly unchanged when measured in a similar fashion on the prior study.  No evidence of parenchymal or interventricular hemorrhage.  Mild underlying sulcal effacement.  No midline shift.  No CT evidence of acute infarction.  Subcortical white matter and periventricular small vessel ischemic changes.  Intracranial atherosclerosis.  Partial opacification of the left sphenoid sinus.  The visualized paranasal sinuses and mastoid air cells are otherwise clear.  No evidence of  calvarial fracture.  IMPRESSION: Stable left frontotemporal subdural hematoma measuring 7 mm in thickness.   Original Report Authenticated By: Charline Bills, M.D.    Ir Ivc Filter Plmt / S&i /img Guid/mod Sed  06/20/2012  *RADIOLOGY REPORT*  Indication: History of lower extremity DVT and pulmonary embolism, post fall, now with acute subdural hematoma  ULTRASOUND GUIDANCE FOR VASCULAR ACCESS IVC CATHETERIZATION AND VENOGRAM IVC FILTER INSERTION  Medications: Fentanyl 25 mcg IV; Versed 1 mg IV  Contrast: None, CO2 was utilized for this examination secondary to chronic renal insufficiency.  Sedation time: 15 minutes  Fluoroscopy time: 1.2 minutes  Complications: None immediate  PROCEDURE:  Informed written consent was obtained from the patient's family following explanation of the procedure, risks, benefits and alternatives.  A time out was performed prior to the initiation of the procedure.  Maximal barrier sterile technique utilized including caps, mask, sterile gowns, sterile gloves, large sterile drape, hand hygiene, and Betadine prep.  Under sterile condition and local anesthesia, right common femoral venous access was performed with ultrasound.  An ultrasound image was saved and sent to PACS.  Over a guide wire, the IVC filter delivery sheath and inner dilator were advanced into the IVC just above the IVC bifurcation.  Contrast injection was performed for an IVC venogram.  Through the delivery sheath, a retrievable Denali IVC filter was deployed below the level of the renal veins and above the IVC bifurcation.  Post deployment CT venogram  was performed.  The delivery sheath was removed and hemostasis was obtained with manual compression.  A dressing was placed.  The patient tolerated the procedure well without immediate post procedural complication.  Findings:  The IVC is patent.  No evidence of thrombus, stenosis, or occlusion.  No variant venous anatomy.   Successful placement of the IVC filter below the  level of the renal veins.  IMPRESSION:  Successful ultrasound and fluoroscopically guided placement of an infrarenal retrievable IVC filter.   Original Report Authenticated By: Tacey Ruiz, MD     Assessment/Plan: Stable from a neurosurgical perspective. Hyponatremia persists. Hypokalemia improved. Will need followup CT scan Monday, November 18. Agree with PT and OT. Suspect patient will do best in SNF rather than ALF setting, but will defer to judgment and decision of [facility] and patient's family.   Hewitt Shorts, MD 06/21/2012, 11:07 AM

## 2012-06-21 NOTE — Progress Notes (Signed)
[facility] Progress Note [place] TEAM 1 - Stepdown/ICU TEAM   Nicole Harding OZH:[id number] DOB: [birth year] DOA: 06/18/2012 PCP: [name], MD  Brief narrative: 78 year old female who was sent to the hospital because of altered mental status for 24 hours. This was associated with vomiting. Patient normally resides at an assisted living facility and when the family went to visit her they found her to be unresponsive. 2 days prior she had fallen and was evaluated in the ER and CT of the head done during that visit demonstrated no evidence of subdural hematoma but she did have a urinary tract infection. Important to note that the patient prior to admission was on Coumadin for new diagnosis of DVT with pulmonary embolus 2 weeks prior. CT scan was repeated after arrival to the emergency department and this demonstrated a new acute subdural hematoma. She was subsequently admitted to the step down unit for further monitoring and treatment.   Assessment/Plan:  SDH (subdural hematoma) *Secondary to fall in the setting of chronic anticoagulation *Anticoagulation has been reversed and Coumadin has been discontinued indefinitely *Hematoma is very small and followup CT scan of head for 06/20/2012 demonstrates slight increase in size of subdural hematoma from 5 mm to 7 mm *Appreciate neurosurgical assistance-recommends FU CT Head Monday 11/18  Metabolic encephalopathy *Likely related to recent UTI and medications - see below *Family endorsed that prior to the last discharge, patient had been started on new medications -  Zoloft and oxybutynin - since that time they have noted the patient's mental status has not been at her baseline - we will discontinue these medications *Today the patient is slightly confused but not agitated or delirious. She has not received any offending meds but RN reports she had not slept all night- this is likely the cause of her confusion today.  Pseudomonas  UTI (>100k colonies via urine cx 06/16/2012) *Currently hemodynamically stable *Continue Cipro based on urine culture results *DC foley  Hyponatremia - baseline Na+ for this pt 130-135 *Hold HCTZ, encourage PO liquids and cont IV NS *Repeat labs in a.m. *Na close to baseline - no indication for "overcorrection"   Hypokalemia Due to HCTZ + poor intake - replace and follow  History of pulmonary embolism/Warfarin anticoagulation *Anticoagulation has been reversed and INR 1.24 *Discuss with neurosurgery/Dr. Newell Coral. Recommendation is that patient will need to have Coumadin held at least 2-3 months because of subdural hematoma. She also has a history of recurrent falls and does not appear to be an appropriate candidate for future anticoagulation  *IVC filter placed via interventional radiology 06/20/2012  Benign hypertensive heart disease without heart failure *Blood pressure moderately controlled *Continue hydralazine and metoprolol *Pharmacy clarified home dose of Hydralazine- dose decreased to 50 mg TID  CKD (chronic kidney disease) stage 4, GFR 15-29 ml/min *Creatinine just above baseline of around 1.6-2.1  Fall-recurrent *Was resident of assisted living facility prior to admission *PT OT evaluation - suspect SNF will be more appropriate for d/c   DVT prophylaxis: SCDs-cannot anticoagulate because of acute subdural hematoma Code Status: Full Family Communication: Spoke to patient and husband Disposition: Transfer to floor  Consultants: Neurosurgery  Procedures: 11/13 - IVC filter placement  Antibiotics: Cipro 11/11 >>>  HPI/Subjective: Patient alert, somewhat confused again. No appetite, poor PO intake, including liquid intake .   Objective: Blood pressure 147/62, pulse 60, temperature 97.6 F (36.4 C), temperature source Oral, resp. rate 15, height 5\' 5"  (1.651 m), weight 67.4 kg (148 lb 9.4 oz), SpO2 95.00%.  Intake/Output Summary (Last 24 hours) at 06/21/12  1102 Last data filed at 06/21/12 1033  Gross per 24 hour  Intake    678 ml  Output   1501 ml  Net   -823 ml     Exam: General: No acute respiratory distress HEENT: Left eye with periorbital ecchymosis and a large left supraorbital contusion, she also has left-sided facial ecchymosis Lungs: Clear to auscultation bilaterally without wheezes or crackles, nasal cannula oxygen Cardiovascular: Regular rate and rhythm without murmur gallop or rub normal S1 and S2, or peripheral edema Abdomen: Nontender, nondistended, soft, bowel sounds positive, no rebound, no ascites, no appreciable mass Musculoskeletal: No significant cyanosis, clubbing of bilateral lower extremities Neurological: Awake and oriented to name and place, moves all extremities x4  Data Reviewed: Basic Metabolic Panel:  Lab 06/21/12 4540 06/19/12 1810 06/19/12 1455 06/19/12 0530 06/18/12 1940  NA 129* 129* 129* 130* 124*  K 4.4 3.0* 2.9* 3.1* 3.1*  CL 101 95* 95* 93* 88*  CO2 19 21 21 22 19   GLUCOSE 95 90 134* 86 84  BUN 28* 33* 31* 32* 33*  CREATININE 1.68* 1.76* 1.70* 1.73* 1.84*  CALCIUM 8.2* 8.1* 8.6 8.3* 8.5  MG -- -- -- -- --  PHOS -- -- -- -- --   Liver Function Tests:  Lab 06/18/12 1940 06/16/12 0819  AST 35 30  ALT 20 19  ALKPHOS 67 64  BILITOT 0.6 0.4  PROT 6.5 6.4  ALBUMIN 2.7* 2.7*   CBC:  Lab 06/21/12 0435 06/19/12 0530 06/18/12 1940 06/16/12 0819  WBC 9.4 13.5* 18.7* 11.3*  NEUTROABS -- -- 16.8* 9.5*  HGB 9.5* 8.6* 10.4* 10.8*  HCT 28.7* 24.9* 29.9* 32.4*  MCV 91.7 88.3 86.9 89.8  PLT 489* 381 517* 498*   Cardiac Enzymes:  Lab 06/18/12 1940  CKTOTAL --  CKMB --  CKMBINDEX --  TROPONINI <0.30   CBG:  Lab 06/21/12 0736 06/21/12 0650 06/20/12 0714 06/19/12 0851  GLUCAP 98 87 97 90    Recent Results (from the past 240 hour(s))  URINE CULTURE     Status: Normal   Collection Time   06/16/12  9:35 AM      Component Value Range Status Comment   Specimen Description URINE,  CATHETERIZED   Final    Special Requests NONE   Final    Culture  Setup Time 06/16/2012 13:24   Final    Colony Count >=100,000 COLONIES/ML   Final    Culture     Final    Value: PSEUDOMONAS AERUGINOSA     Note: PTZ TO FOLLOW   Report Status 06/19/2012 FINAL   Final    Organism ID, Bacteria PSEUDOMONAS AERUGINOSA   Final   URINE CULTURE     Status: Normal (Preliminary result)   Collection Time   06/18/12  6:29 PM      Component Value Range Status Comment   Specimen Description URINE, CATHETERIZED   Final    Special Requests NONE   Final    Culture  Setup Time 06/19/2012 03:11   Final    Colony Count 40,000 COLONIES/ML   Final    Culture PSEUDOMONAS AERUGINOSA   Final    Report Status PENDING   Incomplete   MRSA PCR SCREENING     Status: Normal   Collection Time   06/19/12 12:27 AM      Component Value Range Status Comment   MRSA by PCR NEGATIVE  NEGATIVE Final   URINE CULTURE  Status: Normal   Collection Time   06/19/12  2:41 AM      Component Value Range Status Comment   Specimen Description URINE, CATHETERIZED   Final    Special Requests NONE   Final    Culture  Setup Time 06/19/2012 08:50   Final    Colony Count 8,000 COLONIES/ML   Final    Culture YEAST   Final    Report Status 06/20/2012 FINAL   Final      Studies:  Recent x-ray studies have been reviewed in detail by the Attending Physician  Scheduled Meds:  Reviewed in detail by the Attending Physician   Junious Silk, ANP [facility] Office  [phone] Pager [phone]  On-Call/Text Page:      [email or web address]      password [email or web address]  If 7PM-7AM, please contact night-coverage [email or web address] Password [email or web address] 06/21/2012, 11:02 AM   LOS: 3 days   I have examined the patient and reviewed the chart. I agree with the above note which I have modified.   Calvert Cantor, MD [phone]

## 2012-06-21 NOTE — Progress Notes (Signed)
Met pt and husband who was bedside. Pt said she felt better today and she and her husband talked about their marriage and vacations. They were delightful. At the end of our visit we had prayer. They were very thankful for the prayer and visit and asked that I return.  Marjory Lies Chaplain

## 2012-06-21 NOTE — Evaluation (Signed)
Physical Therapy Evaluation Patient Details Name: ARLETHA MARSCHKE MRN: [id number] DOB: [birth year] Today's Date: 06/21/2012 Time: 1137-1207 PT Time Calculation (min): 30 min  PT Assessment / Plan / Recommendation Clinical Impression  Mrs. Michna is 78 y/o female recently d/c'd from [facility] to [facility] for ST-SNF following mechanical fall at home. Readmitted 11/11 after another fall at [facility] suffering SDH. Presents to PT today with generalized weakness and balance impairments as well as cognitive deficits impacting her safety and independence with mobility. Will benefit physical therapy to address these and the below impairments so as to maxmize functional independence and facilitate safe d/c.     PT Assessment  Patient needs continued PT services    Follow Up Recommendations  SNF    Does the patient have the potential to tolerate intense rehabilitation      Barriers to Discharge        Equipment Recommendations  None recommended by PT    Recommendations for Other Services     Frequency Min 3X/week    Precautions / Restrictions Precautions Precautions: Fall Restrictions Weight Bearing Restrictions: No         Mobility  Bed Mobility Bed Mobility: Supine to Sit;Sitting - Scoot to Edge of Bed Supine to Sit: 3: Mod assist Sitting - Scoot to Edge of Bed: 3: Mod assist Details for Bed Mobility Assistance: verbal cues for hand placement and to assist trunk Transfers Sit to Stand: From bed;From chair/3-in-1;1: +2 Total assist Sit to Stand: Patient Percentage: 70% Stand to Sit: 1: +2 Total assist;To chair/3-in-1;To bed Stand to Sit: Patient Percentage: 70% Details for Transfer Assistance: facilitation for anterior translation of trunk over BOS, cues to maintain during sit-<>stand for stability and decreased posterior lean Ambulation/Gait Assistive device: 2 person hand held assist Ambulation/Gait Assistance Details: cues for tall posture and safety with gait  (pt impulsive with lateral instability) Gait Pattern: Trunk flexed;Shuffle Stairs: No              PT Diagnosis: Difficulty walking;Abnormality of gait;Generalized weakness;Altered mental status  PT Problem List: Decreased strength;Decreased activity tolerance;Decreased balance;Decreased mobility;Decreased safety awareness;Decreased cognition;Decreased knowledge of use of DME;Decreased knowledge of precautions PT Treatment Interventions: DME instruction;Gait training;Functional mobility training;Therapeutic activities;Therapeutic exercise;Balance training;Neuromuscular re-education;Patient/family education;Cognitive remediation   PT Goals Acute Rehab PT Goals Pt will go Supine/Side to Sit: with supervision PT Goal: Supine/Side to Sit - Progress: Goal set today Pt will go Sit to Supine/Side: with supervision PT Goal: Sit to Supine/Side - Progress: Goal set today Pt will go Sit to Stand: with supervision PT Goal: Sit to Stand - Progress: Goal set today Pt will go Stand to Sit: with supervision PT Goal: Stand to Sit - Progress: Goal set today Pt will Transfer Bed to Chair/Chair to Bed: with supervision PT Transfer Goal: Bed to Chair/Chair to Bed - Progress: Goal set today Pt will Ambulate: with supervision;with least restrictive assistive device PT Goal: Ambulate - Progress: Goal set today  Visit Information  Last PT Received On: 06/21/12 Assistance Needed: +2    Subjective Data  Subjective: The bed is clean. Patient Stated Goal: family wants her to get well and go home after another rehab stay   Prior Functioning  Home Living Available Help at Discharge: Skilled Nursing Facility (pt at SNF for ST rehab) Type of Home: Skilled Nursing Facility Prior Function Level of Independence: Needs assistance Needs Assistance: Bathing;Dressing;Feeding;Grooming;Transfers;Gait Bath: Maximal Dressing: Maximal Feeding: Maximal Grooming: Maximal Communication Communication: HOH Dominant  Hand: Left    Cognition  Overall Cognitive Status: History of cognitive impairments - at baseline Arousal/Alertness: Awake/alert Orientation Level: Appears intact for tasks assessed Behavior During Session: [facility] for tasks performed    Extremity/Trunk Assessment Right Upper Extremity Assessment RUE ROM/Strength/Tone: [facility] for tasks assessed Left Upper Extremity Assessment LUE ROM/Strength/Tone: WFL for tasks assessed Right Lower Extremity Assessment RLE ROM/Strength/Tone: Deficits RLE ROM/Strength/Tone Deficits: generally weak, grossly 4/5 Left Lower Extremity Assessment LLE ROM/Strength/Tone: Deficits LLE ROM/Strength/Tone Deficits: generally weak, grossly 4/5   Balance Balance Balance Assessed: Yes Static Sitting Balance Static Sitting - Balance Support: Feet supported Static Sitting - Level of Assistance: 5: Stand by assistance Static Sitting - Comment/# of Minutes: 10 Static Standing Balance Static Standing - Balance Support: Bilateral upper extremity supported Static Standing - Level of Assistance: 1: +2 Total assist;Patient percentage (comment) (70) Static Standing - Comment/# of Minutes: increased postural sway  End of Session PT - End of Session Equipment Utilized During Treatment: Gait belt Activity Tolerance: Patient tolerated treatment well Patient left: in chair;with call bell/phone within reach Nurse Communication: Mobility status  GP     [facility] HELEN 06/21/2012, 3:26 PM

## 2012-06-22 DIAGNOSIS — Z86711 Personal history of pulmonary embolism: Secondary | ICD-10-CM

## 2012-06-22 DIAGNOSIS — N184 Chronic kidney disease, stage 4 (severe): Secondary | ICD-10-CM

## 2012-06-22 DIAGNOSIS — N390 Urinary tract infection, site not specified: Secondary | ICD-10-CM

## 2012-06-22 DIAGNOSIS — I6200 Nontraumatic subdural hemorrhage, unspecified: Secondary | ICD-10-CM

## 2012-06-22 DIAGNOSIS — W19XXXA Unspecified fall, initial encounter: Secondary | ICD-10-CM

## 2012-06-22 LAB — BASIC METABOLIC PANEL
BUN: 23 mg/dL (ref 6–23)
CO2: 17 mEq/L — ABNORMAL LOW (ref 19–32)
Chloride: 101 mEq/L (ref 96–112)
Creatinine, Ser: 1.59 mg/dL — ABNORMAL HIGH (ref 0.50–1.10)
Potassium: 4.1 mEq/L (ref 3.5–5.1)

## 2012-06-22 NOTE — Progress Notes (Signed)
Subjective: Patient sitting up in bed, cheerful, awake and alert. About to work with physical therapy, who slices he did ambulate in the halls yesterday.  Objective: Vital signs in last 24 hours: Filed Vitals:   06/21/12 1509 06/21/12 1834 06/21/12 2201 06/22/12 0520  BP: 159/91 156/94 144/66 147/128  Pulse: 73 70 74 74  Temp: 98 F (36.7 C) 98 F (36.7 C) 98.2 F (36.8 C) 99 F (37.2 C)  TempSrc: Oral Oral Oral Oral  Resp: 18 16 18 16   Height:      Weight:      SpO2: 93% 94% 100% 93%    Intake/Output from previous day: 11/14 0701 - 11/15 0700 In: 300 [I.V.:300] Out: 451 [Urine:450; Stool:1] Intake/Output this shift:    Physical Exam:  Patient awake and alert, oriented fully. Pupils equal round reactive light. Extraocular movements intact. Facial movements symmetrical. Moving all 4 extremities well.  CBC  Basename 06/21/12 0435  WBC 9.4  HGB 9.5*  HCT 28.7*  PLT 489*   BMET  Basename 06/22/12 0525 06/21/12 0435  NA 130* 129*  K 4.1 4.4  CL 101 101  CO2 17* 19  GLUCOSE 98 95  BUN 23 28*  CREATININE 1.59* 1.68*  CALCIUM 8.3* 8.2*    Assessment/Plan: Patient continues to become more alert, neurologically stable. Hyponatremia persists. We'll check CT brain without contrast on Monday, November 18.   Hewitt Shorts, MD 06/22/2012, 10:41 AM

## 2012-06-22 NOTE — Progress Notes (Signed)
Occupational Therapy Treatment Patient Details Name: Nicole Harding MRN: [id number] DOB: [birth year] Today's Date: 06/22/2012 Time: 3244-0102 OT Time Calculation (min): 23 min  OT Assessment / Plan / Recommendation Comments on Treatment Session Pt requires significant assist when ambulating with RW  (unsafe DME use).     Follow Up Recommendations  SNF    Barriers to Discharge       Equipment Recommendations  None recommended by OT    Recommendations for Other Services    Frequency Min 2X/week   Plan Discharge plan remains appropriate    Precautions / Restrictions Precautions Precautions: Fall Restrictions Weight Bearing Restrictions: No   Pertinent Vitals/Pain See vitals    ADL  Grooming: Performed;Wash/dry hands;Minimal assistance Where Assessed - Grooming: Supported Copywriter, advertising: Performed;Maximal Dentist Method: Sit to Barista: Comfort height toilet;Grab bars Toileting - Architect and Hygiene: Performed;Minimal assistance Where Assessed - Engineer, mining and Hygiene: Sit to stand from 3-in-1 or toilet Equipment Used: Gait belt;Rolling walker Transfers/Ambulation Related to ADLs: max assist during ambulation to bathroom with RW. Assist needed for safe use of RW due to pt attempting to hold on to other objects  ADL Comments: Pt fatigued after completing toileting ADL.    OT Diagnosis:    OT Problem List:   OT Treatment Interventions:     OT Goals ADL Goals Pt Will Perform Grooming: with supervision;Standing at sink (2 activities) ADL Goal: Grooming - Progress: Updated due to goal met Pt Will Transfer to Toilet: with min assist;Ambulation;Comfort height toilet;with DME ADL Goal: Toilet Transfer - Progress: Progressing toward goals Miscellaneous OT Goals Miscellaneous OT Goal #1: Pt will perform bed mobility to EOB with supervision in preparation for ADL. OT Goal: Miscellaneous  Goal #1 - Progress: Progressing toward goals  Visit Information  Last OT Received On: 06/22/12 Assistance Needed: +2    Subjective Data      Prior Functioning       Cognition  Overall Cognitive Status: History of cognitive impairments - at baseline Arousal/Alertness: Awake/alert Orientation Level: Oriented X4 / Intact Behavior During Session: [facility] for tasks performed    Mobility  Shoulder Instructions Bed Mobility Bed Mobility: Supine to Sit;Sitting - Scoot to [place] of Bed;Sit to Supine;Scooting to [facility] Rolling Left: 6: Modified independent (Device/Increase time);With rail Supine to Sit: 4: Min guard Sitting - Scoot to [place] of Bed: 4: Min guard Sit to Supine: 5: Supervision Scooting to HOB: 4: Min assist (vc for correct body positioning in bed) Details for Bed Mobility Assistance: verbal cueing for hand placement and to assist with trunk Transfers Transfers: Sit to Stand;Stand to Sit Sit to Stand: 4: Min guard;From toilet;From bed;With upper extremity assist Stand to Sit: 4: Min guard;To bed;To toilet Details for Transfer Assistance: min guard for safety due to balance deficits       Exercises      Balance Balance Balance Assessed: Yes Dynamic Standing Balance Dynamic Standing - Balance Support: Right upper extremity supported;During functional activity Dynamic Standing - Level of Assistance: 3: Mod assist Dynamic Standing - Balance Activities: Forward lean/weight shifting;Other (comment) (during toileting hygiene and self care)   End of Session OT - End of Session Equipment Utilized During Treatment: Gait belt Activity Tolerance: Patient tolerated treatment well Patient left: in bed;with call bell/phone within reach;with bed alarm set;with family/visitor present Nurse Communication: Mobility status  GO    06/22/2012 Cipriano Mile OTR/L Pager [phone] Office [phone]  Cipriano Mile 06/22/2012, 3:20 PM

## 2012-06-22 NOTE — Progress Notes (Signed)
Physical Therapy Treatment Patient Details Name: Nicole Harding MRN: [id number] DOB: 03/20/1924 Today's Date: 06/22/2012 Time: 3244-0102 PT Time Calculation (min): 23 min  PT Assessment / Plan / Recommendation Comments on Treatment Session  Pt continues to demonstrate improved balance, gait, and strength.  She remains with balance and strength deficits and will benefit from continued physical therapy.     Follow Up Recommendations  SNF     Does the patient have the potential to tolerate intense rehabilitation  No, Recommend SNF  Barriers to Discharge        Equipment Recommendations  None recommended by PT    Recommendations for Other Services    Frequency Min 3X/week   Plan Discharge plan remains appropriate;Frequency remains appropriate    Precautions / Restrictions Precautions Precautions: Fall Restrictions Weight Bearing Restrictions: No   Pertinent Vitals/Pain no apparent distress No pain    Mobility  Bed Mobility Bed Mobility: Supine to Sit;Sitting - Scoot to [place] of Bed;Sit to Supine;Scooting to [facility] Rolling Left: 6: Modified independent (Device/Increase time);With rail Supine to Sit: 4: Min guard Sitting - Scoot to [place] of Bed: 4: Min guard Sit to Supine: 5: Supervision Scooting to HOB: 4: Min assist (vc for correct body positioning in bed) Details for Bed Mobility Assistance: verbal cues for hand placement and to assist trunk and reposition in bed Transfers Transfers: Sit to Stand;Stand to Sit Sit to Stand: 4: Min guard Stand to Sit: 4: Min guard Details for Transfer Assistance: pt slightly unbalanced upon standing Ambulation/Gait Ambulation/Gait Assistance: 2: Max assist (vc for RW use, body positioning, and spacial awareness) Ambulation Distance (Feet): 40 Feet Assistive device: Rolling walker Ambulation/Gait Assistance Details: From bed to bathroom and back Gait Pattern: Step-to pattern;Decreased stride length;Shuffle;Trunk flexed Gait velocity:  decreased Stairs: No Wheelchair Mobility Wheelchair Mobility: No      PT Goals Acute Rehab PT Goals PT Goal: Supine/Side to Sit - Progress: Progressing toward goal PT Goal: Sit to Supine/Side - Progress: Progressing toward goal PT Goal: Sit to Stand - Progress: Progressing toward goal PT Goal: Stand to Sit - Progress: Progressing toward goal PT Transfer Goal: Bed to Chair/Chair to Bed - Progress: Progressing toward goal PT Goal: Ambulate - Progress: Progressing toward goal  Visit Information  Last PT Received On: 06/22/12 Assistance Needed: +2 PT/OT Co-Evaluation/Treatment: Yes    Subjective Data  Subjective: Yes, I'm ready to walk with you.  Patient Stated Goal: To improve strength   Cognition  Overall Cognitive Status: History of cognitive impairments - at baseline Arousal/Alertness: Awake/alert Orientation Level: Oriented X4 / Intact Behavior During Session: [facility] for tasks performed    Balance  Balance Balance Assessed: Yes Dynamic Standing Balance Dynamic Standing - Balance Support: Right upper extremity supported  Dynamic Standing - Level of Assistance: 3: Mod assist; 1 hand held assist as she manipulated toilet paper with other had and 1 person side/posterior support for standing.  Dynamic Standing - Balance Activities: Other (comment) (Toileting/cleaning self after )  End of Session PT - End of Session Equipment Utilized During Treatment: Gait belt Activity Tolerance: Patient limited by fatigue Patient left: in bed;with call bell/phone within reach;with family/visitor present Nurse Communication: Mobility status       Sharion Balloon 06/22/2012, 1:53 PM Sharion Balloon, SPT Acute Rehab Services [phone]

## 2012-06-22 NOTE — Progress Notes (Signed)
[facility] Progress Note [place] TEAM 1 - Stepdown/ICU TEAM   Nicole Harding WUX:[id number] DOB: Feb 02, 1924 DOA: 06/18/2012 PCP: [name], MD  Brief narrative: 78 year old female who was sent to the hospital because of altered mental status for 24 hours. This was associated with vomiting. Patient normally resides at an assisted living facility and when the family went to visit her they found her to be unresponsive. 2 days prior she had fallen and was evaluated in the ER and CT of the head done during that visit demonstrated no evidence of subdural hematoma but she did have a urinary tract infection. Important to note that the patient prior to admission was on Coumadin for new diagnosis of DVT with pulmonary embolus 2 weeks prior. CT scan was repeated after arrival to the emergency department and this demonstrated a new acute subdural hematoma. She was subsequently admitted to the step down unit for further monitoring and treatment.   Assessment/Plan:  SDH (subdural hematoma) *Secondary to fall in the setting of chronic anticoagulation *Anticoagulation has been reversed and Coumadin has been discontinued indefinitely *Hematoma is very small and followup CT scan of head for 06/20/2012 demonstrates slight increase in size of subdural hematoma from 5 mm to 7 mm *Appreciate neurosurgical assistance-recommends FU CT Head Monday 11/18 -PT recommending SNF, sw to assist with placement Metabolic encephalopathy *Likely related to recent UTI and medications - see below *Family endorsed that prior to the last discharge, patient had been started on new medications -  Zoloft and oxybutynin - since that time they have noted the patient's mental status has not been at her baseline - we will discontinue these medications *Today the patient is slightly confused but not agitated or delirious. She has not received any offending meds but RN reports she had not slept all night- this is likely  the cause of her confusion 11/14. -Clinically improved today 11/15 Pseudomonas UTI (>100k colonies via urine cx 06/16/2012) *Currently hemodynamically stable *Continue Cipro based on urine culture results *s/p foley removal  Hyponatremia - baseline Na+ for this pt 130-135 *HoldING HCTZ, encourage PO liquids and cont IV NS *Repeat labs in a.m. *Na close to baseline - no indication for "overcorrection"   Hypokalemia Due to HCTZ + poor intake - replace and follow  History of pulmonary embolism/Warfarin anticoagulation *Anticoagulation has been reversed and INR 1.24 *Discuss with neurosurgery/Dr. Newell Coral. Recommendation is that patient will need to have Coumadin held at least 2-3 months because of subdural hematoma. She also has a history of recurrent falls and does not appear to be an appropriate candidate for future anticoagulation  *IVC filter placed via interventional radiology 06/20/2012  Benign hypertensive heart disease without heart failure *Blood pressure moderately controlled *Continue hydralazine and metoprolol *continue Hydralazine-50 mg TID  CKD (chronic kidney disease) stage 4, GFR 15-29 ml/min *Creatinine just above baseline of around 1.6-2.1  Fall-recurrent *Was resident of assisted living facility prior to admission *PT OT evaluation - suspect SNF will be more appropriate for d/c   DVT prophylaxis: SCDs-cannot anticoagulate because of acute subdural hematoma Code Status: Full Family Communication: directly with pt at bedside Disposition: Transfer to floor  Consultants: Neurosurgery  Procedures: 11/13 - IVC filter placement  Antibiotics: Cipro 11/11 >>>  HPI/Subjective: Chart reviewed, Patient alert, and answers simple questions appropriately. Denies headaches, N/V, chest pain and no SOB.   Objective: Blood pressure 147/128, pulse 74, temperature 99 F (37.2 C), temperature source Oral, resp. rate 16, height 5\' 5"  (1.651 m), weight 67.4 kg (148  lb 9.4  oz), SpO2 93.00%.  Intake/Output Summary (Last 24 hours) at 06/22/12 0847 Last data filed at 06/21/12 1100  Gross per 24 hour  Intake    225 ml  Output    201 ml  Net     24 ml     Exam: General: No acute respiratory distress HEENT: decreased periorbital ecchymosis and a large left supraorbital contusion with old dried up blood, decreasing left-sided facial ecchymosis Lungs: Clear to auscultation bilaterally without wheezes or crackles, nasal cannula oxygen Cardiovascular: Regular rate and rhythm without murmur gallop or rub normal S1 and S2, or peripheral edema Abdomen: Nontender, nondistended, soft, bowel sounds positive, no rebound, no ascites, no appreciable mass Musculoskeletal: No significant cyanosis, clubbing of bilateral lower extremities Neurological: Awake and oriented to name and place, moves all extremities x4  Data Reviewed: Basic Metabolic Panel:  Lab 06/22/12 4098 06/21/12 0435 06/19/12 1810 06/19/12 1455 06/19/12 0530  NA 130* 129* 129* 129* 130*  K 4.1 4.4 3.0* 2.9* 3.1*  CL 101 101 95* 95* 93*  CO2 17* 19 21 21 22   GLUCOSE 98 95 90 134* 86  BUN 23 28* 33* 31* 32*  CREATININE 1.59* 1.68* 1.76* 1.70* 1.73*  CALCIUM 8.3* 8.2* 8.1* 8.6 8.3*  MG -- -- -- -- --  PHOS -- -- -- -- --   Liver Function Tests:  Lab 06/18/12 1940 06/16/12 0819  AST 35 30  ALT 20 19  ALKPHOS 67 64  BILITOT 0.6 0.4  PROT 6.5 6.4  ALBUMIN 2.7* 2.7*   CBC:  Lab 06/21/12 0435 06/19/12 0530 06/18/12 1940 06/16/12 0819  WBC 9.4 13.5* 18.7* 11.3*  NEUTROABS -- -- 16.8* 9.5*  HGB 9.5* 8.6* 10.4* 10.8*  HCT 28.7* 24.9* 29.9* 32.4*  MCV 91.7 88.3 86.9 89.8  PLT 489* 381 517* 498*   Cardiac Enzymes:  Lab 06/18/12 1940  CKTOTAL --  CKMB --  CKMBINDEX --  TROPONINI <0.30   CBG:  Lab 06/22/12 0710 06/21/12 0736 06/21/12 0650 06/20/12 0714 06/19/12 0851  GLUCAP 88 98 87 97 90    Recent Results (from the past 240 hour(s))  URINE CULTURE     Status: Normal   Collection Time    06/16/12  9:35 AM      Component Value Range Status Comment   Specimen Description URINE, CATHETERIZED   Final    Special Requests NONE   Final    Culture  Setup Time 06/16/2012 13:24   Final    Colony Count >=100,000 COLONIES/ML   Final    Culture     Final    Value: PSEUDOMONAS AERUGINOSA     Note: PTZ TO FOLLOW   Report Status 06/19/2012 FINAL   Final    Organism ID, Bacteria PSEUDOMONAS AERUGINOSA   Final   URINE CULTURE     Status: Normal   Collection Time   06/18/12  6:29 PM      Component Value Range Status Comment   Specimen Description URINE, CATHETERIZED   Final    Special Requests NONE   Final    Culture  Setup Time 06/19/2012 03:11   Final    Colony Count 40,000 COLONIES/ML   Final    Culture PSEUDOMONAS AERUGINOSA   Final    Report Status 06/21/2012 FINAL   Final    Organism ID, Bacteria PSEUDOMONAS AERUGINOSA   Final   MRSA PCR SCREENING     Status: Normal   Collection Time   06/19/12 12:27 AM  Component Value Range Status Comment   MRSA by PCR NEGATIVE  NEGATIVE Final   URINE CULTURE     Status: Normal   Collection Time   06/19/12  2:41 AM      Component Value Range Status Comment   Specimen Description URINE, CATHETERIZED   Final    Special Requests NONE   Final    Culture  Setup Time 06/19/2012 08:50   Final    Colony Count 8,000 COLONIES/ML   Final    Culture YEAST   Final    Report Status 06/20/2012 FINAL   Final      Studies:  I reviewed in detail all recent studies  Scheduled Meds:  I Reviewed in detail all meds   Kela Millin  [facility] Office  [phone] Pager [phone]  On-Call/Text Page:      [email or web address]      password [email or web address]  If 7PM-7AM, please contact night-coverage [email or web address] Password [email or web address] 06/22/2012, 8:47 AM   LOS: 4 days

## 2012-06-23 DIAGNOSIS — I10 Essential (primary) hypertension: Secondary | ICD-10-CM

## 2012-06-23 LAB — BASIC METABOLIC PANEL
CO2: 16 mEq/L — ABNORMAL LOW (ref 19–32)
Chloride: 107 mEq/L (ref 96–112)
Sodium: 134 mEq/L — ABNORMAL LOW (ref 135–145)

## 2012-06-23 LAB — GLUCOSE, CAPILLARY: Glucose-Capillary: 98 mg/dL (ref 70–99)

## 2012-06-23 LAB — CBC
Platelets: 442 10*3/uL — ABNORMAL HIGH (ref 150–400)
RBC: 3.35 MIL/uL — ABNORMAL LOW (ref 3.87–5.11)
WBC: 10.5 10*3/uL (ref 4.0–10.5)

## 2012-06-23 MED ORDER — LABETALOL HCL 5 MG/ML IV SOLN: 10.0000 mg | INTRAVENOUS | Status: DC | PRN

## 2012-06-23 NOTE — Progress Notes (Signed)
Subjective: Patient reports alert, answering questions briskly, no complaints of pain.  Objective: Vital signs in last 24 hours: Temp:  [98.1 F (36.7 C)-98.2 F (36.8 C)] 98.2 F (36.8 C) (11/16 0645) Pulse Rate:  [80-86] 80  (11/16 0645) Resp:  [16-20] 20  (11/16 0645) BP: (142-207)/(76-90) 142/76 mmHg (11/16 0700) SpO2:  [92 %-94 %] 94 % (11/16 0645)  Intake/Output from previous day:   Intake/Output this shift:    Physical Exam: Awake, alert, conversant.  States name, age, remembers details of her care.  MAEW without drift.  Scalp laceration healing.  Lab Results:  [facility] 06/23/12 0700 06/21/12 0435  WBC 10.5 9.4  HGB 10.0* 9.5*  HCT 30.3* 28.7*  PLT 442* 489*   BMET  Basename 06/23/12 0700 06/22/12 0525  NA 134* 130*  K 4.0 4.1  CL 107 101  CO2 16* 17*  GLUCOSE 96 98  BUN 21 23  CREATININE 1.48* 1.59*  CALCIUM 8.3* 8.3*    Studies/Results: No results found.  Assessment/Plan: Continue to observe, encourage po intake and mobilize with PT.  Repeat head CT scheduled for Monday, which Dr. Newell Coral will assess.    LOS: 5 days    Dorian Heckle, MD 06/23/2012, 10:36 AM

## 2012-06-23 NOTE — Progress Notes (Signed)
[facility] Progress Note [place] TEAM 1 - Stepdown/ICU TEAM   SYMPHANI BOWAN ZOX:[id number] DOB: 29-Aug-1923 DOA: 06/18/2012 PCP: [name], MD  Brief narrative: 78 year old female who was sent to the hospital because of altered mental status for 24 hours. This was associated with vomiting. Patient normally resides at an assisted living facility and when the family went to visit her they found her to be unresponsive. 2 days prior she had fallen and was evaluated in the ER and CT of the head done during that visit demonstrated no evidence of subdural hematoma but she did have a urinary tract infection. Important to note that the patient prior to admission was on Coumadin for new diagnosis of DVT with pulmonary embolus 2 weeks prior. CT scan was repeated after arrival to the emergency department and this demonstrated a new acute subdural hematoma. She was subsequently admitted to the step down unit for further monitoring and treatment.   Assessment/Plan:  SDH (subdural hematoma) *Secondary to fall in the setting of chronic anticoagulation *Anticoagulation has been reversed and Coumadin has been discontinued indefinitely *Hematoma is very small and followup CT scan of head for 06/20/2012 demonstrates slight increase in size of subdural hematoma from 5 mm to 7 mm *Appreciate neurosurgical assistance-recommend FU CT Head Monday 11/18 -PT recommending SNF Metabolic encephalopathy *Likely related to recent UTI and medications - see below *Family endorsed that prior to the last discharge, patient had been started on new medications -  Zoloft and oxybutynin - since that time they have noted the patient's mental status has not been at her baseline - we will discontinue these medications *Today the patient is slightly confused but not agitated or delirious. She has not received any offending meds but RN reports she had not slept all night- this is likely the cause of her confusion  11/14. -Clinically improved today 11/15 Pseudomonas UTI (>100k colonies via urine cx 06/16/2012) *Currently hemodynamically stable *Continue Cipro based on urine culture results *s/p foley removal  Hyponatremia - baseline Na+ for this pt 130-135 *HoldING HCTZ, encourage PO liquids and cont IV NS *Repeat labs in a.m. *Na close to baseline - no indication for "overcorrection"   Hypokalemia Due to HCTZ + poor intake - replace and follow  History of pulmonary embolism/Warfarin anticoagulation *Anticoagulation has been reversed and INR 1.24 *Discuss with neurosurgery/Dr. Newell Coral. Recommendation is that patient will need to have Coumadin held at least 2-3 months because of subdural hematoma. She also has a history of recurrent falls and does not appear to be an appropriate candidate for future anticoagulation  *IVC filter placed via interventional radiology 06/20/2012  Benign hypertensive heart disease without heart failure *Blood pressure moderately controlled *Continue hydralazine and metoprolol *continue Hydralazine-50 mg TID  CKD (chronic kidney disease) stage 4, GFR 15-29 ml/min *Creatinine just above baseline of around 1.6-2.1  Fall-recurrent *Was resident of assisted living facility prior to admission *PT OT evaluation - suspect SNF will be more appropriate for d/c, per social worker she came from [place] [facility]/rehabilitation, and is to return there when medically ready.   DVT prophylaxis: SCDs-cannot anticoagulate because of acute subdural hematoma Code Status: Full Family Communication: directly with pt at bedside Disposition: Back to SNF when medically ready for further rehabilitation.  Consultants: Neurosurgery  Procedures: 11/13 - IVC filter placement  Antibiotics: Cipro 11/11 >>>  HPI/Subjective:  denies any new complaints, husband at bedside. Objective: Blood pressure 142/76, pulse 80, temperature 98.2 F (36.8 C), temperature source Oral, resp. rate 20, height  5'  5" (1.651 m), weight 67.4 kg (148 lb 9.4 oz), SpO2 94.00%. No intake or output data in the 24 hours ending 06/23/12 1004   Exam: General: No acute respiratory distress HEENT: decreased periorbital ecchymosis and a large left supraorbital contusion with old dried up blood, decreasing left-sided facial ecchymosis Lungs: Clear to auscultation bilaterally without wheezes or crackles, nasal cannula oxygen Cardiovascular: Regular rate and rhythm without murmur gallop or rub normal S1 and S2, or peripheral edema Abdomen: Nontender, nondistended, soft, bowel sounds positive, no rebound, no ascites, no appreciable mass Musculoskeletal: No significant cyanosis, clubbing of bilateral lower extremities Neurological: Alert and appropriate, moves all extremities x4  Data Reviewed: Basic Metabolic Panel:  Lab 06/23/12 1191 06/22/12 0525 06/21/12 0435 06/19/12 1810 06/19/12 1455  NA 134* 130* 129* 129* 129*  K 4.0 4.1 4.4 3.0* 2.9*  CL 107 101 101 95* 95*  CO2 16* 17* 19 21 21   GLUCOSE 96 98 95 90 134*  BUN 21 23 28* 33* 31*  CREATININE 1.48* 1.59* 1.68* 1.76* 1.70*  CALCIUM 8.3* 8.3* 8.2* 8.1* 8.6  MG -- -- -- -- --  PHOS -- -- -- -- --   Liver Function Tests:  Lab 06/18/12 1940  AST 35  ALT 20  ALKPHOS 67  BILITOT 0.6  PROT 6.5  ALBUMIN 2.7*   CBC:  Lab 06/23/12 0700 06/21/12 0435 06/19/12 0530 06/18/12 1940  WBC 10.5 9.4 13.5* 18.7*  NEUTROABS -- -- -- 16.8*  HGB 10.0* 9.5* 8.6* 10.4*  HCT 30.3* 28.7* 24.9* 29.9*  MCV 90.4 91.7 88.3 86.9  PLT 442* 489* 381 517*   Cardiac Enzymes:  Lab 06/18/12 1940  CKTOTAL --  CKMB --  CKMBINDEX --  TROPONINI <0.30   CBG:  Lab 06/23/12 0654 06/22/12 0710 06/21/12 0736 06/21/12 0650 06/20/12 0714  GLUCAP 98 88 98 87 97    Recent Results (from the past 240 hour(s))  URINE CULTURE     Status: Normal   Collection Time   06/16/12  9:35 AM      Component Value Range Status Comment   Specimen Description URINE, CATHETERIZED   Final      Special Requests NONE   Final    Culture  Setup Time 06/16/2012 13:24   Final    Colony Count >=100,000 COLONIES/ML   Final    Culture     Final    Value: PSEUDOMONAS AERUGINOSA     Note: PTZ TO FOLLOW   Report Status 06/19/2012 FINAL   Final    Organism ID, Bacteria PSEUDOMONAS AERUGINOSA   Final   URINE CULTURE     Status: Normal   Collection Time   06/18/12  6:29 PM      Component Value Range Status Comment   Specimen Description URINE, CATHETERIZED   Final    Special Requests NONE   Final    Culture  Setup Time 06/19/2012 03:11   Final    Colony Count 40,000 COLONIES/ML   Final    Culture PSEUDOMONAS AERUGINOSA   Final    Report Status 06/21/2012 FINAL   Final    Organism ID, Bacteria PSEUDOMONAS AERUGINOSA   Final   MRSA PCR SCREENING     Status: Normal   Collection Time   06/19/12 12:27 AM      Component Value Range Status Comment   MRSA by PCR NEGATIVE  NEGATIVE Final   URINE CULTURE     Status: Normal   Collection Time   06/19/12  2:41 AM  Component Value Range Status Comment   Specimen Description URINE, CATHETERIZED   Final    Special Requests NONE   Final    Culture  Setup Time 06/19/2012 08:50   Final    Colony Count 8,000 COLONIES/ML   Final    Culture YEAST   Final    Report Status 06/20/2012 FINAL   Final      Studies:  I reviewed in detail all recent studies  Scheduled Meds:  I Reviewed in detail all meds   Kela Millin  [facility] Office  [phone] Pager [phone]  On-Call/Text Page:      [email or web address]      password [email or web address]  If 7PM-7AM, please contact night-coverage [email or web address] Password [email or web address] 06/23/2012, 10:04 AM   LOS: 5 days

## 2012-06-24 DIAGNOSIS — I119 Hypertensive heart disease without heart failure: Secondary | ICD-10-CM

## 2012-06-24 LAB — BASIC METABOLIC PANEL
GFR calc Af Amer: 33 mL/min — ABNORMAL LOW (ref >90)
GFR calc non Af Amer: 28 mL/min — ABNORMAL LOW (ref >90)
Potassium: 3.9 mEq/L (ref 3.5–5.1)
Sodium: 137 mEq/L (ref 135–145)

## 2012-06-24 NOTE — Progress Notes (Signed)
[facility] Progress Note [place] TEAM 1 - Stepdown/ICU TEAM   Nicole Harding:[id number] DOB: 06-08-1924 DOA: 06/18/2012 PCP: [name], MD  Brief narrative: 78 year old female who was sent to the hospital because of altered mental status for 24 hours. This was associated with vomiting. Patient normally resides at an assisted living facility and when the family went to visit her they found her to be unresponsive. 2 days prior she had fallen and was evaluated in the ER and CT of the head done during that visit demonstrated no evidence of subdural hematoma but she did have a urinary tract infection. Important to note that the patient prior to admission was on Coumadin for new diagnosis of DVT with pulmonary embolus 2 weeks prior. CT scan was repeated after arrival to the emergency department and this demonstrated a new acute subdural hematoma. She was subsequently admitted to the step down unit for further monitoring and treatment.   Assessment/Plan:  SDH (subdural hematoma) *Secondary to fall in the setting of chronic anticoagulation *Anticoagulation has been reversed and Coumadin has been discontinued indefinitely *Hematoma is very small and followup CT scan of head for 06/20/2012 demonstrates slight increase in size of subdural hematoma from 5 mm to 7 mm *Appreciate neurosurgical assistance- F/U CT Head in am Monday 11/18 -PT recommending SNF Metabolic encephalopathy *Likely related to recent UTI and medications - see below *Family endorsed that prior to the last discharge, patient had been started on new medications -  Zoloft and oxybutynin - since that time they have noted the patient's mental status has not been at her baseline - we will discontinue these medications *Today the patient is slightly confused but not agitated or delirious. She has not received any offending meds but RN reports she had not slept all night- this is likely the cause of her confusion  11/14. -Clinically improved today 11/15 Pseudomonas UTI (>100k colonies via urine cx 06/16/2012) *Currently hemodynamically stable *Continue Cipro based on urine culture results *s/p foley removal  Hyponatremia - baseline Na+ for this pt 130-135 *HoldING HCTZ, encourage PO liquids and cont IV NS *resolved    Hypokalemia Due to HCTZ + poor intake - repleted  History of pulmonary embolism/Warfarin anticoagulation *Anticoagulation has been reversed and INR 1.24 *Discuss with neurosurgery/Dr. Newell Coral. Recommendation is that patient will need to have Coumadin held at least 2-3 months because of subdural hematoma. She also has a history of recurrent falls and does not appear to be an appropriate candidate for future anticoagulation  *IVC filter placed via interventional radiology 06/20/2012  Benign hypertensive heart disease without heart failure *Blood pressure moderately controlled *Continue hydralazine and metoprolol *continue Hydralazine-50 mg TID  CKD (chronic kidney disease) stage 4, GFR 15-29 ml/min *baseline cr of around 1.6-2.1 -improved to about better than baseline today 11/17  Fall-recurrent *Was resident of assisted living facility prior to admission *PT OT evaluation - suspect SNF will be more appropriate for d/c, per social worker she came from [place] [facility]/rehabilitation, and is to return there when medically ready.   DVT prophylaxis: SCDs-cannot anticoagulate because of acute subdural hematoma Code Status: Full Family Communication: directly with pt at bedside Disposition: Back to SNF when medically ready for further rehabilitation.  Consultants: Neurosurgery  Procedures: 11/13 - IVC filter placement  Antibiotics: Cipro 11/11 >>>  HPI/Subjective:  denies any new complaints,tolerating po well. Objective: Blood pressure 173/75, pulse 77, temperature 98 F (36.7 C), temperature source Oral, resp. rate 18, height 5\' 5"  (1.651 m), weight 67.4 kg (148  lb 9.4  oz), SpO2 93.00%.  Intake/Output Summary (Last 24 hours) at 06/24/12 1444 Last data filed at 06/24/12 0536  Gross per 24 hour  Intake    980 ml  Output      0 ml  Net    980 ml     Exam: General: No acute respiratory distress HEENT: decreased periorbital ecchymosis and a large left supraorbital contusion with old dried up blood, decreasing left-sided facial ecchymosis Lungs: Clear to auscultation bilaterally without wheezes or crackles, nasal cannula oxygen Cardiovascular: Regular rate and rhythm without murmur gallop or rub normal S1 and S2, or peripheral edema Abdomen: Nontender, nondistended, soft, bowel sounds positive, no rebound, no ascites, no appreciable mass Musculoskeletal: No significant cyanosis, clubbing of bilateral lower extremities Neurological: Alert and appropriate, moves all extremities x4  Data Reviewed: Basic Metabolic Panel:  Lab 06/24/12 0981 06/23/12 0700 06/22/12 0525 06/21/12 0435 06/19/12 1810  NA 137 134* 130* 129* 129*  K 3.9 4.0 4.1 4.4 3.0*  CL 109 107 101 101 95*  CO2 17* 16* 17* 19 21  GLUCOSE 96 96 98 95 90  BUN 20 21 23  28* 33*  CREATININE 1.58* 1.48* 1.59* 1.68* 1.76*  CALCIUM 7.8* 8.3* 8.3* 8.2* 8.1*  MG -- -- -- -- --  PHOS -- -- -- -- --   Liver Function Tests:  Lab 06/18/12 1940  AST 35  ALT 20  ALKPHOS 67  BILITOT 0.6  PROT 6.5  ALBUMIN 2.7*   CBC:  Lab 06/23/12 0700 06/21/12 0435 06/19/12 0530 06/18/12 1940  WBC 10.5 9.4 13.5* 18.7*  NEUTROABS -- -- -- 16.8*  HGB 10.0* 9.5* 8.6* 10.4*  HCT 30.3* 28.7* 24.9* 29.9*  MCV 90.4 91.7 88.3 86.9  PLT 442* 489* 381 517*   Cardiac Enzymes:  Lab 06/18/12 1940  CKTOTAL --  CKMB --  CKMBINDEX --  TROPONINI <0.30   CBG:  Lab 06/24/12 0643 06/23/12 0654 06/22/12 0710 06/21/12 0736 06/21/12 0650  GLUCAP 85 98 88 98 87    Recent Results (from the past 240 hour(s))  URINE CULTURE     Status: Normal   Collection Time   06/16/12  9:35 AM      Component Value Range Status  Comment   Specimen Description URINE, CATHETERIZED   Final    Special Requests NONE   Final    Culture  Setup Time 06/16/2012 13:24   Final    Colony Count >=100,000 COLONIES/ML   Final    Culture     Final    Value: PSEUDOMONAS AERUGINOSA     Note: PTZ TO FOLLOW   Report Status 06/19/2012 FINAL   Final    Organism ID, Bacteria PSEUDOMONAS AERUGINOSA   Final   URINE CULTURE     Status: Normal   Collection Time   06/18/12  6:29 PM      Component Value Range Status Comment   Specimen Description URINE, CATHETERIZED   Final    Special Requests NONE   Final    Culture  Setup Time 06/19/2012 03:11   Final    Colony Count 40,000 COLONIES/ML   Final    Culture PSEUDOMONAS AERUGINOSA   Final    Report Status 06/21/2012 FINAL   Final    Organism ID, Bacteria PSEUDOMONAS AERUGINOSA   Final   MRSA PCR SCREENING     Status: Normal   Collection Time   06/19/12 12:27 AM      Component Value Range Status Comment   MRSA  by PCR NEGATIVE  NEGATIVE Final   URINE CULTURE     Status: Normal   Collection Time   06/19/12  2:41 AM      Component Value Range Status Comment   Specimen Description URINE, CATHETERIZED   Final    Special Requests NONE   Final    Culture  Setup Time 06/19/2012 08:50   Final    Colony Count 8,000 COLONIES/ML   Final    Culture YEAST   Final    Report Status 06/20/2012 FINAL   Final      Studies:  I reviewed in detail all recent studies  Scheduled Meds:  I Reviewed in detail all meds   Kela Millin  [facility] Office  [phone] Pager [phone]  On-Call/Text Page:      [email or web address]      password [email or web address]  If 7PM-7AM, please contact night-coverage [email or web address] Password [email or web address] 06/24/2012, 2:44 PM   LOS: 6 days

## 2012-06-24 NOTE — Progress Notes (Signed)
Patient ID: Nicole Harding, female   DOB: 06/04/1924, 78 y.o.   MRN: [id number] BP 173/75  Pulse 77  Temp 98 F (36.7 C) (Oral)  Resp 18  Ht 5\' 5"  (1.651 m)  Wt 67.4 kg (148 lb 9.4 oz)  BMI 24.73 kg/m2  SpO2 93% Alert and oriented x 4 No drift perrl Wound healing without signs of infection CT for tomorrow. Doing quite well

## 2012-06-25 ENCOUNTER — Inpatient Hospital Stay (HOSPITAL_COMMUNITY): Payer: Medicare Other

## 2012-06-25 DIAGNOSIS — I10 Essential (primary) hypertension: Secondary | ICD-10-CM

## 2012-06-25 MED ORDER — ISOSORBIDE MONONITRATE ER 30 MG PO TB24
30.0000 mg | ORAL_TABLET | Freq: Once | ORAL | Status: AC
  Administered 2012-06-25: 30 mg via ORAL
  Filled 2012-06-25: qty 1

## 2012-06-25 MED ORDER — HYDRALAZINE HCL 25 MG PO TABS
25.0000 mg | ORAL_TABLET | Freq: Once | ORAL | Status: AC
  Administered 2012-06-25: 25 mg via ORAL
  Filled 2012-06-25: qty 1

## 2012-06-25 MED ORDER — CIPROFLOXACIN HCL 500 MG PO TABS: 500.0000 mg | ORAL_TABLET | Freq: Every day | ORAL | Status: DC

## 2012-06-25 MED ORDER — ISOSORBIDE MONONITRATE ER 30 MG PO TB24: 30.0000 mg | ORAL_TABLET | Freq: Every day | ORAL | Status: DC

## 2012-06-25 MED ORDER — HYDRALAZINE HCL 25 MG PO TABS: 25.0000 mg | ORAL_TABLET | Freq: Four times a day (QID) | ORAL | Status: DC

## 2012-06-25 MED ORDER — BACITRACIN-NEOMYCIN-POLYMYXIN OINTMENT TUBE: 1.0000 "application " | TOPICAL_OINTMENT | Freq: Every day | CUTANEOUS | Status: DC

## 2012-06-25 NOTE — Discharge Summary (Signed)
Physician Discharge Summary  Nicole Harding EAV:[id number] DOB: October 20, 1923 DOA: 06/18/2012  PCP: [name], MD  Admit date: 06/18/2012 Discharge date: 06/25/2012  Time spent: 30 minutes  Recommendations for Outpatient Follow-up:   Discharge Diagnoses:  Principal Problem:  *SDH (subdural hematoma) Active Problems:  Benign hypertensive heart disease without heart failure  CKD (chronic kidney disease) stage 4, GFR 15-29 ml/min  HTN (hypertension)  Fall-recurrent  Metabolic encephalopathy  Sepsis due to Pseudomonas  History of pulmonary embolism  Warfarin anticoagulation  UTI (lower urinary tract infection) due to pseudomonas   Discharge Condition: improved/satble  Diet recommendation: 2 g sodium  Filed Weights   06/18/12 1720 06/19/12 0000  Weight: 65.318 kg (144 lb) 67.4 kg (148 lb 9.4 oz)    History of present illness:  Nicole Harding is a 78 y.o. female who presents with AMS. Onset yesterday associated with some vomiting, family went to visit her at her ALF today and she was unresponsive completely to them. This occurs in the context of a UTI with recent fall 2 days ago where she hit her head, patient is on coumadin for a DVT with PE diagnosed a couple of weeks ago. She has shown some minimal improvement since arrival in the ED per her family.  Although a CT scan of her head was performed after the fall 2 days ago, there was not an evident SDH at that time per the read. A repeat CT head performed today 06/18/12, does demonstrate a new acute SDH that is now evident at 5mm size, believed to have been present previously at just 2mm of size. She was admitted for further evaluation and management.   Hospital Course:  SDH (subdural hematoma)  *Secondary to fall in the setting of chronic anticoagulation  *Anticoagulation was reversed and Coumadin was discontinued indefinitely  *Neurosurgery was consulted and followed patient in the hospital, Dr. Newell Coral saw  patient and spoke with a family indicating that the subdural hematoma would likely resolve on its on but there was a small chance of it evolving into a chronic subdural hematoma that required surgical intervention. She was monitored closely in the ICU and subsequently transferred to the neuro floor. *It was noted initially that the Hematoma was very small and followup CT scan of head for 06/18/2012 demonstrates slight increase in size of subdural hematoma from 5 mm to 7 mm, neurosurgery followed and she had another repeat CT scan done on 11/13 which showed a stable left frontotemporal subdural hematoma measuring and 7 mm in thickness. She continued to improve clinically and neurosurgery followed and she had another repeat CT scan of the head today 11/18 which showed no significant change since 5 days ago, no increase in the subdural hematoma along the left lateral convexity and measuring maximally 6 mm in thickness without significant mass effect. Dr. Newell Coral followed up with patient today and recommends that patient needs to stay off all anticoagulants indefinitely. From his standpoint she is okay to be discharged and is to follow up with him in 3 weeks with a repeat CT scan of head at that time-his office is to are set up a repeat CT. Anti-hypertensives where adjusted in the hospital for better blood pressure control. The HCTZ was discontinued secondary to hyponatremia. His been discharged on hydralazine,imdur and metoprolol. Metabolic encephalopathy  *Likely related to recent UTI and medications - see below  *Family endorsed that prior to the last discharge, patient had been started on new medications - Zoloft and oxybutynin - since that  time they have noted the patient's mental status has not been at her baseline - we will discontinue these medications  *Today the patient is slightly confused but not agitated or delirious. She has not received any offending meds but RN reports she had not slept all night-  this is likely the cause of her confusion 11/14.  -Clinically improved today 11/15  Pseudomonas UTI (>100k colonies via urine cx 06/16/2012)  *Currently hemodynamically stable  *Continue Cipro based on urine culture results for 3 more days as directed. *s/p foley removal  Hyponatremia - baseline Na+ for this pt 130-135  *HoldING HCTZ, encourage PO liquids and cont IV NS  *resolved  Hypokalemia  Due to HCTZ + poor intake - repleted  History of pulmonary embolism/Warfarin anticoagulation  *Anticoagulation has been reversed and INR 1.24  *As discussed above per neurosurgery recommendations patient needs to stay off anticoagulation indefinitely for now, she is to followup with Dr. Newell Coral for further directions as far as anticoagulation. She also has a history of recurrent falls and does not appear to be an appropriate candidate for future anticoagulation  *IVC filter placed via interventional radiology 06/20/2012  Benign hypertensive heart disease without heart failure  *Blood pressure moderately controlled  *Continue hydralazine and metoprolol, M.D. also added as discussed above. *continue Hydralazine-50 mg TID  CKD (chronic kidney disease) stage 4, GFR 15-29 ml/min  *baseline cr of around 1.6-2.1  -improved to about better than baseline today -her creatinine is 1.58 today prior to discharge. She is to follow up with outpatient MDs. Fall-recurrent  *Was resident of SNF prior to admission  *PT OT evaluation - suspect SNF will be more appropriate for d/c, per social worker she came from [facility], and is to return there when medically ready.    Procedures:  IVC filter  Consultations: Neurosurgery   Discharge Exam: Filed Vitals:   06/24/12 0600 06/24/12 1400 06/24/12 2058 06/25/12 0605  BP: 173/75 142/80 191/75 195/81  Pulse: 77 80 72 73  Temp: 98 F (36.7 C) 98.5 F (36.9 C) 98.2 F (36.8 C) 97.4 F (36.3 C)  TempSrc:  Oral Oral Oral  Resp: 18 18 16 16     Height:      Weight:      SpO2: 93% 95% 93% 91%   Exam:  General: No acute respiratory distress  HEENT: decreased periorbital ecchymosis and a large left supraorbital contusion with old dried up blood, decreasing left-sided facial ecchymosis  Lungs: Clear to auscultation bilaterally without wheezes or crackles, nasal cannula oxygen  Cardiovascular: Regular rate and rhythm without murmur gallop or rub normal S1 and S2, or peripheral edema  Abdomen: Nontender, nondistended, soft, bowel sounds positive, no rebound, no ascites, no appreciable mass  Musculoskeletal: No significant cyanosis, clubbing of bilateral lower extremities  Neurological: Alert and appropriate, moves all extremities x4     Discharge Instructions  Discharge Orders    Future Appointments: Provider: Department: Dept Phone: Center:   07/17/2012 10:30 AM Rosalio Macadamia, NP [facility] Main [facility] [place]) [phone] [id number]     Future Orders Please Complete By Expires   Diet - low sodium heart healthy      Increase activity slowly      Discharge instructions      Comments:   Needs head CT on follow up with Dr Nudelman/neurosurgery in 3weeks       Medication List     As of 06/25/2012  2:56 PM    STOP taking these medications  aspirin 81 MG tablet      cephALEXin 750 MG capsule   Commonly known as: KEFLEX      hydrochlorothiazide 25 MG tablet   Commonly known as: HYDRODIURIL      warfarin 1 MG tablet   Commonly known as: COUMADIN      TAKE these medications         ciprofloxacin 500 MG tablet   Commonly known as: CIPRO   Take 1 tablet (500 mg total) by mouth daily.      hydrALAZINE 25 MG tablet   Commonly known as: APRESOLINE   Take 1 tablet (25 mg total) by mouth 4 (four) times daily.      isosorbide mononitrate 30 MG 24 hr tablet   Commonly known as: IMDUR   Take 1 tablet (30 mg total) by mouth daily.      metoprolol succinate 25 MG 24 hr tablet   Commonly known as:  TOPROL-XL   Take 25 mg by mouth 2 (two) times daily.      multivitamin with minerals Tabs   Take 1 tablet by mouth daily.      neomycin-bacitracin-polymyxin Oint   Commonly known as: NEOSPORIN   Apply 1 application topically daily.      oxybutynin 5 MG 24 hr tablet   Commonly known as: DITROPAN-XL   Take 1 tablet (5 mg total) by mouth at bedtime.      sertraline 25 MG tablet   Commonly known as: ZOLOFT   Take 1 tablet (25 mg total) by mouth at bedtime.            Follow-up Information    Follow up with Hewitt Shorts, MD. (in 3weeks, call for appt upon discharge)    Contact information:   [address] [place] [address] [phone]       Please follow up. (SNF MD in 1-2days)           The results of significant diagnostics from this hospitalization (including imaging, microbiology, ancillary and laboratory) are listed below for reference.    Significant Diagnostic Studies: Dg Chest 2 View  05/31/2012  *RADIOLOGY REPORT*  Clinical Data: Chest pain.  CHEST - 2 VIEW  Comparison: 05/31/2012 and 08/15/2005  Findings: Two views of the chest were obtained.  There are scattered calcifications throughout both sides of the chest which are unchanged.  There is stable prominence of a right structure. The right hilar structure is likely vascular in etiology and similar to an exam in 2007.  Heart size is normal.  No evidence for a pneumothorax.  IMPRESSION: Stable chest radiograph findings.  No acute disease.  Evidence for old granulomatous disease.   Original Report Authenticated By: Richarda Overlie, M.D.    Dg Chest 2 View  05/31/2012  *RADIOLOGY REPORT*  Clinical Data: Shortness of breath, sharp pain under the right ribs, no known injury  CHEST - 2 VIEW  Comparison: 12/24/2007  Findings:  Unchanged cardiac silhouette and mediastinal contours with mild tortuosity of the thoracic aorta.  Calcified granulomas are again seen overlying  the bilateral upper lungs.  The lungs appear hyperexpanded with flattening of bilateral hemidiaphragms.  No focal parenchymal opacities.  No definite pleural effusion or pneumothorax.  No definite acute osseous abnormalities.  IMPRESSION: 1.  Hyperexpanded lungs without definite acute cardiopulmonary disease. 2.  Stable sequela of prior granulomatous infection.   Original Report Authenticated By: Waynard Reeds, M.D.    Dg  Ribs Unilateral Right  05/31/2012  *RADIOLOGY REPORT*  Clinical Data: Sharp pain under right ribs, no known injury  RIGHT RIBS - 2 VIEW  Comparison: Chest radiograph - earlier same day; 12/24/2007  Findings:  Unchanged cardiac silhouette and mediastinal contours with atherosclerotic calcifications within a mildly tortuous thoracic aorta.  Calcified granuloma overlies the right upper lung.  No definite displaced right-sided rib fractures with special attention paid to the area demarcated by the radiopaque arrow.  IMPRESSION: No displaced right-sided rib fractures with special attention paid to the area demarcated by the radiopaque arrow.   Original Report Authenticated By: Waynard Reeds, M.D.    Dg Hip Complete Left  05/28/2012  *RADIOLOGY REPORT*  Clinical Data: Fall, hip injury  LEFT HIP - COMPLETE 2+ VIEW  Comparison: None.  Findings: Bones are osteopenic.  Degenerative changes of the lower lumbar spine, pelvis, and both hips.  Pelvis and hips appear intact.  No displaced left hip fracture.  Trabecular patterns appear symmetric and intact.  IMPRESSION: Degenerative changes and osteopenia.  No acute osseous finding by plain radiography   Original Report Authenticated By: Judie Petit. Ruel Favors, M.D.    Dg Ankle Complete Left  06/06/2012  *RADIOLOGY REPORT*  Clinical Data: Lateral ankle swelling  LEFT ANKLE COMPLETE - 3+ VIEW  Comparison: None.  Findings: Bones appear osteopenic.  Peripheral vascular calcifications noted.  Normal alignment without fracture.  No effusion noted.  Preserved  joint spaces.  IMPRESSION: Osteopenia.  No acute osseous finding.   Original Report Authenticated By: Judie Petit. Ruel Favors, M.D.    Ct Head Wo Contrast  06/25/2012  *RADIOLOGY REPORT*  Clinical Data: Follow-up subdural hematoma.  CT HEAD WITHOUT CONTRAST  Technique:  Contiguous axial images were obtained from the base of the skull through the vertex without contrast.  Comparison: 06/20/2012  Findings: Subdural hematoma along the lateral convexity on the left is no larger, with maximal thickness of 6 mm.  No significant mass effect.  No left to right shift.  No new bleeding is evident. Elsewhere, the brain shows atrophy and extensive chronic small vessel disease throughout the deep white matter.  No hydrocephalus. No evidence of acute infarction.  The calvarium is unremarkable. Sinuses are clear.  There is extensive vascular calcification of the major vessels at the base of the brain.  IMPRESSION: No significant change since 5 days ago.  No increase in the subdural hematoma along the left lateral convexity measuring maximally 6 mm in thickness without significant mass effect.   Original Report Authenticated By: Paulina Fusi, M.D.    Ct Head Wo Contrast  06/20/2012  *RADIOLOGY REPORT*  Clinical Data: Follow up SDH  CT HEAD WITHOUT CONTRAST  Technique:  Contiguous axial images were obtained from the base of the skull through the vertex without contrast.  Comparison: 06/18/2012  Findings: Left frontotemporal subdural hematoma measuring 7 mm in thickness (series 2/image 15), grossly unchanged when measured in a similar fashion on the prior study.  No evidence of parenchymal or interventricular hemorrhage.  Mild underlying sulcal effacement.  No midline shift.  No CT evidence of acute infarction.  Subcortical white matter and periventricular small vessel ischemic changes.  Intracranial atherosclerosis.  Partial opacification of the left sphenoid sinus.  The visualized paranasal sinuses and mastoid air cells are  otherwise clear.  No evidence of calvarial fracture.  IMPRESSION: Stable left frontotemporal subdural hematoma measuring 7 mm in thickness.   Original Report Authenticated By: Charline Bills, M.D.    Ct Head Wo Contrast  06/18/2012  *RADIOLOGY REPORT*  Clinical Data: Altered mental status.  Recent falls.  CT HEAD WITHOUT CONTRAST  Technique:  Contiguous axial images were obtained from the base of the skull through the vertex without contrast.  Comparison: 06/16/2012.  Findings: There is an acute left subdural hematoma extending over the left frontotemporal region.  Maximum thickness of 5 mm.  No underlying skull fracture. Slight mass effect on the underlying brain.  Left frontal scalp hematoma persists. This subdural hematoma is more obvious than on the prior study where it was only 2 mm thick.  Advanced atrophy with chronic microvascular ischemic change. Numerous lacunes.  No subarachnoid or intraventricular blood.  No acute sinus or mastoid fluid.  IMPRESSION: Acute left subdural hematoma in the frontotemporal region.  This is increased since the prior study on 11/09.  Critical Value/emergent results were called by telephone at the time of interpretation on 06/18/2012 at 2000 hours to ordering provider, who verbally acknowledged these results.   Original Report Authenticated By: Davonna Belling, M.D.    Ct Head Wo Contrast  06/16/2012  *RADIOLOGY REPORT*  Clinical Data: Pain, fall  CT HEAD WITHOUT CONTRAST  Technique:  Contiguous axial images were obtained from the base of the skull through the vertex without contrast.  Comparison: 05/13/2009  Findings: No skull fracture is noted.  There is scalp swelling and skin irregularity in the left frontal region.  No intracranial hemorrhage, mass effect or midline shift.  No acute infarction.  Stable cerebral atrophy. Slight worsening periventricular and subcortical white matter decreased attenuation consistent with chronic small vessel ischemic changes.  No acute  infarction.  No mass lesion is noted on this unenhanced scan.  IMPRESSION:  1.  No acute intracranial abnormality.  Stable cerebral atrophy. Slight worsening periventricular and subcortical patchy chronic white matter disease. 2.  There is scalp swelling and probable skin injury in the left frontal region.   Original Report Authenticated By: Natasha Mead, M.D.    Mr Cervical Spine Wo Contrast  06/08/2012  *RADIOLOGY REPORT*  Clinical Data:  Elderly patient with multiple falls.  Cervical thoracic myelopathy.  MRI CERVICAL AND THORACIC SPINE WITHOUT CONTRAST  Technique:  Multiplanar and multiecho pulse sequences of the cervical spine, to include the craniocervical junction and cervicothoracic junction, and the thoracic spine, were obtained without intravenous contrast.  Comparison:  Chest radiographs 05/31/2012.  MRI CERVICAL SPINE  Findings:  Study is mildly motion degraded.  The cervical alignment is normal.  There is no evidence of acute fracture or acute paraspinal abnormality.  There is a 6 mm left para esophageal nodule at C6-C7 which is nonspecific.  This may reflect a lymph node.  Atrophy is noted in the posterior fossa.  There is a small lacunar infarct within the pons.  The cervical cord is normal in signal and caliber.  Bilateral vertebral artery flow voids are noted.  Mild pannus formation is noted surrounding the odontoid process. There is no osseous erosion. At C2-C3, there is asymmetric facet hypertrophy on the right with mild resulting right foraminal stenosis.  C3-C4: High signal within the disc on T1 and T2-weighted images is probably due to auto fusion with posterior osteophytes.  The facet joints also appear fused.  Uncinate spurring contributes to moderate right greater than left foraminal stenosis.  C4-C5:  Uncinate spurring and bilateral facet hypertrophy contribute to moderate right and mild left foraminal stenosis.  C5-C6:  There is possible fusion across the right facet joint. There is mild  uncinate spurring.  No  significant spinal stenosis or nerve root encroachment.  C6-C7:  There are prominent anterior osteophytes and bilateral facet hypertrophy.  No significant spinal stenosis or nerve root encroachment.  C7-T1:  Bilateral facet hypertrophy.  No spinal stenosis or nerve root encroachment.  IMPRESSION:  1.  No cervical cord compression or evidence of cervical myelopathy. 2.  Multilevel spondylosis with uncinate spurring and facet disease as described.  There is probable auto fusion at C3-C4.  The right C5-C6 facet joint may be fused as well. There is mild to moderate foraminal stenosis throughout the cervical spine as detailed above. 3.  No acute osseous findings or malalignment.  MRI THORACIC SPINE  Findings: The thoracic alignment is normal.  There is no evidence of acute fracture or paraspinal abnormality.  There are paraspinal osteophytes throughout the thoracic spine.  The thoracic cord is normal in signal and caliber.  The conus medullaris extends to the L1 level.  There are scattered small thoracic disc protrusions.  There is no cord deformity, foraminal compromise or nerve root encroachment. Small bilateral pleural effusions are noted incidentally.  IMPRESSION:  1.  Minimal thoracic spondylosis.  No significant spinal stenosis or nerve root encroachment. 2.  No evidence of thoracic myelopathy. 3.  No acute osseous findings.   Original Report Authenticated By: Carey Bullocks, M.D.    Mr Thoracic Spine Wo Contrast  06/08/2012  *RADIOLOGY REPORT*  Clinical Data:  Elderly patient with multiple falls.  Cervical thoracic myelopathy.  MRI CERVICAL AND THORACIC SPINE WITHOUT CONTRAST  Technique:  Multiplanar and multiecho pulse sequences of the cervical spine, to include the craniocervical junction and cervicothoracic junction, and the thoracic spine, were obtained without intravenous contrast.  Comparison:  Chest radiographs 05/31/2012.  MRI CERVICAL SPINE  Findings:  Study is mildly motion  degraded.  The cervical alignment is normal.  There is no evidence of acute fracture or acute paraspinal abnormality.  There is a 6 mm left para esophageal nodule at C6-C7 which is nonspecific.  This may reflect a lymph node.  Atrophy is noted in the posterior fossa.  There is a small lacunar infarct within the pons.  The cervical cord is normal in signal and caliber.  Bilateral vertebral artery flow voids are noted.  Mild pannus formation is noted surrounding the odontoid process. There is no osseous erosion. At C2-C3, there is asymmetric facet hypertrophy on the right with mild resulting right foraminal stenosis.  C3-C4: High signal within the disc on T1 and T2-weighted images is probably due to auto fusion with posterior osteophytes.  The facet joints also appear fused.  Uncinate spurring contributes to moderate right greater than left foraminal stenosis.  C4-C5:  Uncinate spurring and bilateral facet hypertrophy contribute to moderate right and mild left foraminal stenosis.  C5-C6:  There is possible fusion across the right facet joint. There is mild uncinate spurring.  No significant spinal stenosis or nerve root encroachment.  C6-C7:  There are prominent anterior osteophytes and bilateral facet hypertrophy.  No significant spinal stenosis or nerve root encroachment.  C7-T1:  Bilateral facet hypertrophy.  No spinal stenosis or nerve root encroachment.  IMPRESSION:  1.  No cervical cord compression or evidence of cervical myelopathy. 2.  Multilevel spondylosis with uncinate spurring and facet disease as described.  There is probable auto fusion at C3-C4.  The right C5-C6 facet joint may be fused as well. There is mild to moderate foraminal stenosis throughout the cervical spine as detailed above. 3.  No acute osseous findings or malalignment.  MRI THORACIC SPINE  Findings: The thoracic alignment is normal.  There is no evidence of acute fracture or paraspinal abnormality.  There are paraspinal osteophytes  throughout the thoracic spine.  The thoracic cord is normal in signal and caliber.  The conus medullaris extends to the L1 level.  There are scattered small thoracic disc protrusions.  There is no cord deformity, foraminal compromise or nerve root encroachment. Small bilateral pleural effusions are noted incidentally.  IMPRESSION:  1.  Minimal thoracic spondylosis.  No significant spinal stenosis or nerve root encroachment. 2.  No evidence of thoracic myelopathy. 3.  No acute osseous findings.   Original Report Authenticated By: Carey Bullocks, M.D.    Ir Ivc Filter Plmt / S&i /img Guid/mod Sed  06/20/2012  *RADIOLOGY REPORT*  Indication: History of lower extremity DVT and pulmonary embolism, post fall, now with acute subdural hematoma  ULTRASOUND GUIDANCE FOR VASCULAR ACCESS IVC CATHETERIZATION AND VENOGRAM IVC FILTER INSERTION  Medications: Fentanyl 25 mcg IV; Versed 1 mg IV  Contrast: None, CO2 was utilized for this examination secondary to chronic renal insufficiency.  Sedation time: 15 minutes  Fluoroscopy time: 1.2 minutes  Complications: None immediate  PROCEDURE:  Informed written consent was obtained from the patient's family following explanation of the procedure, risks, benefits and alternatives.  A time out was performed prior to the initiation of the procedure.  Maximal barrier sterile technique utilized including caps, mask, sterile gowns, sterile gloves, large sterile drape, hand hygiene, and Betadine prep.  Under sterile condition and local anesthesia, right common femoral venous access was performed with ultrasound.  An ultrasound image was saved and sent to PACS.  Over a guide wire, the IVC filter delivery sheath and inner dilator were advanced into the IVC just above the IVC bifurcation.  Contrast injection was performed for an IVC venogram.  Through the delivery sheath, a retrievable Denali IVC filter was deployed below the level of the renal veins and above the IVC bifurcation.  Post  deployment CT venogram was performed.  The delivery sheath was removed and hemostasis was obtained with manual compression.  A dressing was placed.  The patient tolerated the procedure well without immediate post procedural complication.  Findings:  The IVC is patent.  No evidence of thrombus, stenosis, or occlusion.  No variant venous anatomy.   Successful placement of the IVC filter below the level of the renal veins.  IMPRESSION:  Successful ultrasound and fluoroscopically guided placement of an infrarenal retrievable IVC filter.   Original Report Authenticated By: Tacey Ruiz, MD    US Renal  06/05/2012  *RADIOLOGY REPORT*  Clinical Data: Acute renal insufficiency  RENAL/URINARY TRACT ULTRASOUND COMPLETE  Comparison:  05/19/2006  Findings:  Right Kidney:  8.2 cm in length.  No hydronephrosis or focal lesion.  Isoechoic or slightly echogenic compared to the adjacent liver.  Left Kidney:  10.2 cm in length.  No focal lesion or hydronephrosis.  Bladder:  Decompressed by Foley catheter  IMPRESSION:  1.  No hydronephrosis. 2.  Echogenic renal parenchyma, a nonspecific indicator of medical renal disease. 3.  Asymmetric kidney sizes, left greater than right, suggesting right renal artery stenosis or previous parenchymal insult.   Original Report Authenticated By: Thora Lance III, M.D.    Nm Pulmonary Perf And Vent  06/01/2012  *RADIOLOGY REPORT*  Clinical Data: Shortness of breath and chest pain on the right. The patient status post fall 1 week ago with right rib trauma.  NM PULMONARY VENTILATION AND PERFUSION SCAN  Radiopharmaceutical: CURIE MAA TECHNETIUM TO 26M ALBUMIN AGGREGATED 38 mCi technetium 61m DTPA for ventilation  Comparison: Plain film chest 05/31/2012.  Findings: Multiple peripheral wedge shaped defects are seen on perfusion imaging.  Most of these are matched although matching appears incomplete in some locations such as the right middle lobe.  IMPRESSION: Intermediate probability  for pulmonary embolus.   Original Report Authenticated By: Bernadene Bell. Maricela Curet, M.D.    Dg Chest Port 1 View  06/18/2012  *RADIOLOGY REPORT*  Clinical Data: Altered mental status and weakness.  PORTABLE CHEST - 1 VIEW  Comparison: 06/05/2012  Findings: Numerous leads and wires project over the chest.  Patient rotated to the right. Normal heart size.  No pleural effusion or pneumothorax.  Clear lungs.  IMPRESSION: No acute cardiopulmonary disease.   Original Report Authenticated By: Jeronimo Greaves, M.D.    Dg Chest Port 1 View  06/05/2012  *RADIOLOGY REPORT*  Clinical Data: Hypotension, rigors,  PORTABLE CHEST - 1 VIEW  Comparison: 05/31/2012.  Findings: Lung volumes are low on this examination.  The lungs are clear.  No consolidation or edema.  There are no effusions or pneumothoraces.  The heart, mediastinal and hilar contours are normal with note made again of prominent central pulmonary arteries.  There are no acute bony abnormalities.  IMPRESSION: No active disease.   Original Report Authenticated By: Mervin Hack, M.D.    Dg Shoulder Left  05/28/2012  *RADIOLOGY REPORT*  Clinical Data: Contusion after fall.  LEFT SHOULDER - 2+ VIEW  Comparison: None.  Findings: No fractures or dislocations.  Mild degenerative changes in the acromioclavicular joint.  The glenohumeral joint is normally preserved.  Small coarse calcification in the rotator interval. Contiguous ribs are normal.  IMPRESSION: No acute skeletal trauma.   Original Report Authenticated By: Mervin Hack, M.D.    Dg Hand Complete Right  06/16/2012  *RADIOLOGY REPORT*  Clinical Data: Fall, laceration  RIGHT HAND - COMPLETE 3+ VIEW  Comparison: None.  Findings: Three views of the right hand submitted.  No acute fracture or subluxation.  Diffuse osteopenia.  Degenerative changes noted interphalangeal joint of the thumb.  Degenerative changes distal interphalangeal joint second third fourth and fifth finger. Degenerative changes  first carpal metacarpal joint.  Mild narrowing of the radiocarpal joint space.  IMPRESSION: No acute fracture or subluxation.  Diffuse osteopenia. Degenerative changes as described above.   Original Report Authenticated By: Natasha Mead, M.D.    Varney Biles Kayleen Memos W/kub  06/05/2012  *RADIOLOGY REPORT*  Clinical Data:  Nausea, vomiting  UPPER GI SERIES WITH KUB  Technique:  Routine upper GI series was performed with thin barium  Fluoroscopy Time: 1.30 minutes  Comparison:  None.  Findings: A preliminary film of the abdomen shows a nonspecific bowel gas pattern.  There are degenerative changes in the lower lumbar spine.  Initially rapid sequence spot films of the cervical esophagus were performed.  The swallowing mechanism is unremarkable.  There does appear to be some prominence of the cricopharyngeus muscle.  Only mild tertiary contractions are present in the mid and of the esophagus.  The stomach is normal in contour and peristalsis.  The duodenal bulb fills and the duodenal loop is in normal position.  There is spontaneous gastroesophageal reflux present.  IMPRESSION:  1.  Spontaneous gastroesophageal reflux.  No stricture. 2.  Mild tertiary contractions in the distal esophagus. 3.  The stomach and duodenum are unremarkable on this single contrast study.   Original Report Authenticated By: Juline Patch,  M.D.     Microbiology: Recent Results (from the past 240 hour(s))  URINE CULTURE     Status: Normal   Collection Time   06/16/12  9:35 AM      Component Value Range Status Comment   Specimen Description URINE, CATHETERIZED   Final    Special Requests NONE   Final    Culture  Setup Time 06/16/2012 13:24   Final    Colony Count >=100,000 COLONIES/ML   Final    Culture     Final    Value: PSEUDOMONAS AERUGINOSA     Note: PTZ TO FOLLOW   Report Status 06/19/2012 FINAL   Final    Organism ID, Bacteria PSEUDOMONAS AERUGINOSA   Final   URINE CULTURE     Status: Normal   Collection Time   06/18/12  6:29 PM       Component Value Range Status Comment   Specimen Description URINE, CATHETERIZED   Final    Special Requests NONE   Final    Culture  Setup Time 06/19/2012 03:11   Final    Colony Count 40,000 COLONIES/ML   Final    Culture PSEUDOMONAS AERUGINOSA   Final    Report Status 06/21/2012 FINAL   Final    Organism ID, Bacteria PSEUDOMONAS AERUGINOSA   Final   MRSA PCR SCREENING     Status: Normal   Collection Time   06/19/12 12:27 AM      Component Value Range Status Comment   MRSA by PCR NEGATIVE  NEGATIVE Final   URINE CULTURE     Status: Normal   Collection Time   06/19/12  2:41 AM      Component Value Range Status Comment   Specimen Description URINE, CATHETERIZED   Final    Special Requests NONE   Final    Culture  Setup Time 06/19/2012 08:50   Final    Colony Count 8,000 COLONIES/ML   Final    Culture YEAST   Final    Report Status 06/20/2012 FINAL   Final      Labs: Basic Metabolic Panel:  Lab 06/24/12 8657 06/23/12 0700 06/22/12 0525 06/21/12 0435 06/19/12 1810  NA 137 134* 130* 129* 129*  K 3.9 4.0 4.1 4.4 3.0*  CL 109 107 101 101 95*  CO2 17* 16* 17* 19 21  GLUCOSE 96 96 98 95 90  BUN 20 21 23  28* 33*  CREATININE 1.58* 1.48* 1.59* 1.68* 1.76*  CALCIUM 7.8* 8.3* 8.3* 8.2* 8.1*  MG -- -- -- -- --  PHOS -- -- -- -- --   Liver Function Tests:  Lab 06/18/12 1940  AST 35  ALT 20  ALKPHOS 67  BILITOT 0.6  PROT 6.5  ALBUMIN 2.7*   No results found for this basename: LIPASE:5,AMYLASE:5 in the last 168 hours No results found for this basename: AMMONIA:5 in the last 168 hours CBC:  Lab 06/23/12 0700 06/21/12 0435 06/19/12 0530 06/18/12 1940  WBC 10.5 9.4 13.5* 18.7*  NEUTROABS -- -- -- 16.8*  HGB 10.0* 9.5* 8.6* 10.4*  HCT 30.3* 28.7* 24.9* 29.9*  MCV 90.4 91.7 88.3 86.9  PLT 442* 489* 381 517*   Cardiac Enzymes:  Lab 06/18/12 1940  CKTOTAL --  CKMB --  CKMBINDEX --  TROPONINI <0.30   BNP: BNP (last 3 results) No results found for this basename: PROBNP:3  in the last 8760 hours CBG:  Lab 06/24/12 0643 06/23/12 0654 06/22/12 0710 06/21/12 0736 06/21/12 0650  GLUCAP 85  98 88 98 87       Signed:  Ulis Kaps C  [facility] 06/25/2012, 2:17 PM

## 2012-06-25 NOTE — Progress Notes (Signed)
Physical Therapy Treatment Patient Details Name: Nicole Harding MRN: [id number] DOB: [birth year] Today's Date: 06/25/2012 Time: 1478-2956 PT Time Calculation (min): 18 min  PT Assessment / Plan / Recommendation Comments on Treatment Session  Pt with h/o falls at home. Pt con't to require 24/7 supervision/assist for safe mobility. Patient c/o "I'm weak." Recommend ST SNF to maximize independence prior to d/c home. Pt open to rehab.     Follow Up Recommendations  SNF     Does the patient have the potential to tolerate intense rehabilitation     Barriers to Discharge        Equipment Recommendations       Recommendations for Other Services    Frequency Min 3X/week   Plan Discharge plan remains appropriate;Frequency remains appropriate    Precautions / Restrictions Precautions Precautions: Fall Restrictions Weight Bearing Restrictions: No   Pertinent Vitals/Pain Pt denies pain    Mobility  Bed Mobility Bed Mobility: Supine to Sit Supine to Sit: 4: Min guard Details for Bed Mobility Assistance: pt with use of hand rail Transfers Transfers: Sit to Stand;Stand to Sit Sit to Stand: 4: Min guard;From bed;With upper extremity assist Stand to Sit: 4: Min guard;With upper extremity assist;To chair/3-in-1 Details for Transfer Assistance: v/c's for safety and hand placement Ambulation/Gait Ambulation/Gait Assistance: 4: Min assist Ambulation Distance (Feet): 75 Feet (x2, 5 min rest break in btw) Assistive device: Rolling walker Gait Pattern: Step-to pattern;Decreased stride length;Shuffle;Trunk flexed Gait velocity: slow Stairs: No    Exercises     PT Diagnosis:    PT Problem List:   PT Treatment Interventions:     PT Goals Acute Rehab PT Goals PT Goal: Supine/Side to Sit - Progress: Progressing toward goal PT Goal: Sit to Supine/Side - Progress: Progressing toward goal PT Goal: Sit to Stand - Progress: Progressing toward goal PT Goal: Stand to Sit - Progress:  Progressing toward goal PT Transfer Goal: Bed to Chair/Chair to Bed - Progress: Progressing toward goal PT Goal: Ambulate - Progress: Progressing toward goal  Visit Information  Last PT Received On: 06/25/12 Assistance Needed: +1    Subjective Data  Subjective: Pt received supine in bed with request to change gown.   Cognition  Overall Cognitive Status: History of cognitive impairments - at baseline Arousal/Alertness: Awake/alert Orientation Level: Oriented X4 / Intact Behavior During Session: [facility] for tasks performed    Balance     End of Session PT - End of Session Equipment Utilized During Treatment: Gait belt Activity Tolerance: Patient limited by fatigue Patient left: in chair;with call bell/phone within reach Nurse Communication: Mobility status   GP     Marcene Brawn 06/25/2012, 12:23 PM  Lewis Shock, PT, DPT Pager #: [phone] Office #: [phone]

## 2012-06-25 NOTE — Progress Notes (Signed)
Spoke to nurse receiving patient and gave report. No further questions or concerns expressed by caretaker or patient. Patient transported to [facility] via ambulance.

## 2012-06-25 NOTE — Progress Notes (Signed)
Occupational Therapy Treatment Patient Details Name: JACALYNN BUZZELL MRN: [id number] DOB: February 21, 1924 Today's Date: 06/25/2012 Time: 0102-7253 OT Time Calculation (min): 37 min  OT Assessment / Plan / Recommendation Comments on Treatment Session Pt doing well. Pt has a solid DC plan-she could benefit from inpatient rehab    Follow Up Recommendations  CIR;SNF;Home health OT;Other (comment) (depending on progress)          Recommendations for Other Services Rehab consult  Frequency Min 2X/week      Precautions / Restrictions Precautions Precautions: Fall       ADL  Toilet Transfer Method: Sit to stand;Other (comment) (pt performed sit to stand 3 times focusing on hand placment) Toileting - Clothing Manipulation and Hygiene: Moderate assistance Where Assessed - Toileting Clothing Manipulation and Hygiene: Standing Transfers/Ambulation Related to ADLs: OT session focused on safety with sit to stand, as well as standing endurance. Pt able to stand for 5 min with walker 3 times with resting in between  Pt has a solid DC plan with husband and family to care for her      OT Goals ADL Goals ADL Goal: Toilet Transfer - Progress: Progressing toward goals Miscellaneous OT Goals OT Goal: Miscellaneous Goal #1 - Progress: Progressing toward goals  Visit Information  Last OT Received On: 06/25/12          Cognition  Overall Cognitive Status: History of cognitive impairments - at baseline Arousal/Alertness: Awake/alert Orientation Level: Oriented X4 / Intact Behavior During Session: [facility] for tasks performed    Mobility  Shoulder Instructions Bed Mobility Bed Mobility: Supine to Sit Supine to Sit: 4: Min assist Sitting - Scoot to Edge of Bed: 4: Min assist Sit to Supine: 4: Min assist Scooting to [facility]: 4: Min assist Transfers Transfers: Sit to Stand;Stand to Sit Sit to Stand: 4: Min assist Stand to Sit: 4: Min assist             End of Session OT - End of  Session Activity Tolerance: Patient limited by fatigue;Other (comment) (good paticipation- although did have fatigue) Patient left: in bed;with call bell/phone within reach;with family/visitor present       Tanaisha Pittman, [place] 06/25/2012, 1:22 PM

## 2012-06-25 NOTE — Progress Notes (Signed)
Subjective: Patient resting comfortably in bed, denies headache. Reports that she's eating well. Continues on Cipro for pseudomonas urosepsis. Continues on KCl 40 mEq daily for potassium replacement. CT brain without this morning shows persistence of the left hemispheric subdural hematoma, but without significant mass effect.  Objective: Vital signs in last 24 hours: Filed Vitals:   06/24/12 0600 06/24/12 1400 06/24/12 2058 06/25/12 0605  BP: 173/75 142/80 191/75 195/81  Pulse: 77 80 72 73  Temp: 98 F (36.7 C) 98.5 F (36.9 C) 98.2 F (36.8 C) 97.4 F (36.3 C)  TempSrc:  Oral Oral Oral  Resp: 18 18 16 16   Height:      Weight:      SpO2: 93% 95% 93% 91%    Intake/Output from previous day:   Intake/Output this shift:    Physical Exam:  Awake and alert, oriented. Following commands. Speech fluent. Pupils are equal round react to light. Extraocular movements intact. Facial movements symmetrical. Moving all 4 extremity is well. No drift of the upper extremities.  CBC  Basename 06/23/12 0700  WBC 10.5  HGB 10.0*  HCT 30.3*  PLT 442*   BMET  Basename 06/24/12 0700 06/23/12 0700  NA 137 134*  K 3.9 4.0  CL 109 107  CO2 17* 16*  GLUCOSE 96 96  BUN 20 21  CREATININE 1.58* 1.48*  CALCIUM 7.8* 8.3*    Studies/Results: Ct Head Wo Contrast  06/25/2012  *RADIOLOGY REPORT*  Clinical Data: Follow-up subdural hematoma.  CT HEAD WITHOUT CONTRAST  Technique:  Contiguous axial images were obtained from the base of the skull through the vertex without contrast.  Comparison: 06/20/2012  Findings: Subdural hematoma along the lateral convexity on the left is no larger, with maximal thickness of 6 mm.  No significant mass effect.  No left to right shift.  No new bleeding is evident. Elsewhere, the brain shows atrophy and extensive chronic small vessel disease throughout the deep white matter.  No hydrocephalus. No evidence of acute infarction.  The calvarium is unremarkable. Sinuses are  clear.  There is extensive vascular calcification of the major vessels at the base of the brain.  IMPRESSION: No significant change since 5 days ago.  No increase in the subdural hematoma along the left lateral convexity measuring maximally 6 mm in thickness without significant mass effect.   Original Report Authenticated By: Paulina Fusi, M.D.     Assessment/Plan: Remains neurologically intact. CT shows persistence of subdural hematoma. I will need to have the patient followup with me in the office in 3 weeks with a CT of the brain without contrast that day.  Clearing of mental status is felt primarily be due to treatment of urosepsis and correction of electrolyte arrangements.Hyponatremia and hypokalemia now improved.  Spoke with patient and her husband about recommendations for followup, has spoken with Dr. Donna Bernard ([facility]) and possible social worker about recommendations for followup with me. Also explained that because of the persistence of the subdural hematoma that anticoagulation cannot be resumed at this time. She does have a IVC filter in place. Their questions were answered for them.    Hewitt Shorts, MD 06/25/2012, 1:27 PM

## 2012-06-25 NOTE — Progress Notes (Signed)
Clinical Child psychotherapist submitted DC Summary to [employer] and alerted [employer]  CSW updated RN.  CSW to phone transport.  CSW to sign off at dc.    Angelia Mould, MSW, [place] [phone]

## 2012-07-04 ENCOUNTER — Other Ambulatory Visit: Payer: Self-pay | Admitting: Neurosurgery

## 2012-07-04 DIAGNOSIS — S065X9A Traumatic subdural hemorrhage with loss of consciousness of unspecified duration, initial encounter: Secondary | ICD-10-CM

## 2012-07-17 ENCOUNTER — Encounter: Payer: Self-pay | Admitting: Nurse Practitioner

## 2012-07-17 ENCOUNTER — Ambulatory Visit (INDEPENDENT_AMBULATORY_CARE_PROVIDER_SITE_OTHER): Payer: Medicare Other | Admitting: Nurse Practitioner

## 2012-07-17 VITALS — BP 130/70 | HR 56 | Ht 67.0 in | Wt 150.8 lb

## 2012-07-17 DIAGNOSIS — I6200 Nontraumatic subdural hemorrhage, unspecified: Secondary | ICD-10-CM

## 2012-07-17 DIAGNOSIS — S065X9A Traumatic subdural hemorrhage with loss of consciousness of unspecified duration, initial encounter: Secondary | ICD-10-CM

## 2012-07-17 NOTE — Progress Notes (Signed)
Nicole Harding Date of Birth: 02-06-1924 Medical Record #[id number]  History of Present Illness: Nicole Harding is seen back today for a 3 month check. She is seen for Dr. Patty Sermons. She has numerous issues which include HTN, IBS and CKD stage 4. Normal nuclear stress test back in 2003 per Dr. Jenne Campus. She has most recently been hospitalized last month with being unresponsive. This occurred in the context of a UTI with an associated fall where she had hit her head. She has been on coumadin for a DVT with PE from just a few weeks prior to this last admission. She was seen by Dr. Newell Coral. She is off of anticoagulation indefinitely, including aspirin. Got a filter placed. UTI was treated. She was discharged back to SNF.   She comes in today. She is here with her husband. She was transported from [place]. She is doing pretty well. For discharge from the facility this Friday. She is getting stronger. Husband is worried about the medicines. No chest pain. Not short of breath. On oxygen. For repeat labs on Thursday at [facility]. Tolerating her medicines and seems to doing well with her rehab therapies. Seeing neurosurgery tomorrow with a repeat CT.   Current Outpatient Prescriptions on File Prior to Visit  Medication Sig Dispense Refill  . hydrALAZINE (APRESOLINE) 25 MG tablet Take 1 tablet (25 mg total) by mouth 4 (four) times daily.      . isosorbide mononitrate (IMDUR) 30 MG 24 hr tablet Take 1 tablet (30 mg total) by mouth daily.      . metoprolol succinate (TOPROL-XL) 25 MG 24 hr tablet Take 25 mg by mouth 2 (two) times daily.      . Multiple Vitamin (MULITIVITAMIN WITH MINERALS) TABS Take 1 tablet by mouth daily.      Marland Kitchen neomycin-bacitracin-polymyxin (NEOSPORIN) OINT Apply 1 application topically daily.      Marland Kitchen oxybutynin (DITROPAN-XL) 5 MG 24 hr tablet Take 1 tablet (5 mg total) by mouth at bedtime.  30 tablet  0  . sertraline (ZOLOFT) 25 MG tablet Take 1 tablet (25 mg total) by mouth at bedtime.   30 tablet      Allergies  Allergen Reactions  . Amlodipine Nausea And Vomiting and Swelling    Swelling --sick and ended up in hospital N&V  . Codeine Nausea And Vomiting  . Lipitor (Atorvastatin Calcium) Other (See Comments)    "cramps my legs"  . Lisinopril Other (See Comments)    unknown  . Red Yeast Rice Other (See Comments)    unknown  . Zetia (Ezetimibe) Other (See Comments)    unknown    Past Medical History  Diagnosis Date  . Hypertension   . History of orthostatic hypotension   . IBS (irritable bowel syndrome)   . Parkinson's disease     ATYPICAL  . Dizziness   . Vertigo   . Fatigue   . Advanced age   . HLD (hyperlipidemia)     intolerant to lipid lowering drugs  . Normal nuclear stress test 2003  . Fall at home 05/2012    mechanical fall  (06/01/2012)  . Pulmonary embolism 05/31/2012    off coumadin due to fall/SDH; has filter in place  . Chronic kidney disease, stage 4 (severe)   . Exertional dyspnea   . Arthritis     "legs, fingers" (06/01/2012)  . Subdural hematoma     Past Surgical History  Procedure Date  . Small intestine surgery   . Hemorrhoid surgery 1970    "  have4 had accidents ever since" (06/01/2012)  . Abdominal hysterectomy 1970's  . Tonsillectomy 1954  . Cholecystectomy 1955?  Marland Kitchen Breast cyst excision 1970's    left  . Cataract extraction w/ intraocular lens  implant, bilateral ? 1990's  . Shoulder open rotator cuff repair 1990?    right    History  Smoking status  . Former Smoker -- 1.0 packs/day for 20 years  . Types: Cigarettes  . Quit date: 02/14/1971  Smokeless tobacco  . Never Used    History  Alcohol Use No    Family History  Problem Relation Age of Onset  . Stroke Father     Review of Systems: The review of systems is per the HPI.  All other systems were reviewed and are negative.  Physical Exam: BP 130/70  Pulse 56  Ht 5\' 7"  (1.702 m)  Wt 150 lb 12.8 oz (68.402 kg)  BMI 23.62 kg/m2 Patient is very  pleasant and in no acute distress. She is in a wheelchair. Skin is warm and dry. Color is normal.  HEENT is unremarkable except she still has a dressing over the left forehead. Normocephalic/atraumatic. PERRL. Sclera are nonicteric. Neck is supple. No masses. No JVD. Lungs are clear. Cardiac exam shows a regular rate and rhythm. Abdomen is soft. Extremities are without edema. Gait and ROM are intact. No gross neurologic deficits noted.  LABORATORY DATA:    Chemistry      Component Value Date/Time   NA 137 06/24/2012 0700   K 3.9 06/24/2012 0700   CL 109 06/24/2012 0700   CO2 17* 06/24/2012 0700   BUN 20 06/24/2012 0700   CREATININE 1.58* 06/24/2012 0700      Component Value Date/Time   CALCIUM 7.8* 06/24/2012 0700   ALKPHOS 67 06/18/2012 1940   AST 35 06/18/2012 1940   ALT 20 06/18/2012 1940   BILITOT 0.6 06/18/2012 1940     Lab Results  Component Value Date   WBC 10.5 06/23/2012   HGB 10.0* 06/23/2012   HCT 30.3* 06/23/2012   MCV 90.4 06/23/2012   PLT 442* 06/23/2012    Lab Results  Component Value Date   CHOL 259* 01/31/2012   HDL 63.70 01/31/2012   LDLCALC 47 09/23/2011   LDLDIRECT 122.7 01/31/2012   TRIG 409.0 Triglyceride is over 400; calculations on Lipids are invalid.* 01/31/2012   CHOLHDL 4 01/31/2012    Assessment / Plan: 1. Recent SDH - managed conservatively. Off of all anticoagulation. Sees neurosurgery tomorrow.   2. Recent PE - now with filter in place. Not a candidate for anticoagulation.   3. HTN - blood pressure looks good.   4. UTI - finished antibiotics.   Overall, she is doing well. Seems to be getting stronger. I would like to see her back in a month. No change in her medicines. A copy of her med list is given to her husband as well.   Patient is agreeable to this plan and will call if any problems develop in the interim.

## 2012-07-17 NOTE — Patient Instructions (Addendum)
I think she is doing well.  Go by this list of medicines  I will see you in a month  Call the [facility] office at [phone] if you have any questions, problems or concerns.

## 2012-07-18 ENCOUNTER — Ambulatory Visit
Admission: RE | Admit: 2012-07-18 | Discharge: 2012-07-18 | Disposition: A | Discharge status: Home / Self Care | Payer: Medicare Other | Source: Ambulatory Visit | Attending: Neurosurgery | Admitting: Neurosurgery

## 2012-07-18 DIAGNOSIS — S065X9A Traumatic subdural hemorrhage with loss of consciousness of unspecified duration, initial encounter: Secondary | ICD-10-CM

## 2012-07-23 ENCOUNTER — Telehealth: Payer: Self-pay | Admitting: Cardiology

## 2012-07-23 NOTE — Telephone Encounter (Signed)
New problem:   Physical therapy is asking for message to be sent to Nicole Harding.  1. Need an order for speech therapy per husband request & home health aid.  2. Need to continue OT for 5 weeks.

## 2012-07-24 NOTE — Telephone Encounter (Signed)
Ok for additional visits, speech therapy, and aid

## 2012-07-24 NOTE — Telephone Encounter (Signed)
Left message to call back  

## 2012-08-14 ENCOUNTER — Ambulatory Visit: Payer: Medicare Other | Admitting: Nurse Practitioner

## 2012-08-17 ENCOUNTER — Telehealth: Payer: Self-pay | Admitting: Cardiology

## 2012-08-17 NOTE — Telephone Encounter (Signed)
Nicole Harding and patient

## 2012-08-17 NOTE — Telephone Encounter (Signed)
Take an extra 25 mg hydralazine if the systolic blood pressure is more than 150

## 2012-08-17 NOTE — Telephone Encounter (Signed)
New problem:   Seen in home today . C/O blood pressure range 168/78,  182/80  For the last visit . In the am by the OT  140/82, 150/78

## 2012-08-17 NOTE — Telephone Encounter (Signed)
Will forward to  Dr. Brackbill for review 

## 2012-08-20 ENCOUNTER — Ambulatory Visit (INDEPENDENT_AMBULATORY_CARE_PROVIDER_SITE_OTHER): Payer: Medicare Other | Admitting: Nurse Practitioner

## 2012-08-20 ENCOUNTER — Encounter: Payer: Self-pay | Admitting: Nurse Practitioner

## 2012-08-20 VITALS — BP 140/70 | HR 68 | Ht 67.0 in | Wt 151.8 lb

## 2012-08-20 DIAGNOSIS — I6200 Nontraumatic subdural hemorrhage, unspecified: Secondary | ICD-10-CM

## 2012-08-20 DIAGNOSIS — S065X9A Traumatic subdural hemorrhage with loss of consciousness of unspecified duration, initial encounter: Secondary | ICD-10-CM

## 2012-08-20 NOTE — Patient Instructions (Addendum)
I think you are doing well.  Stay on your current medicines  See Dr. Patty Sermons in 6 weeks  Call the [facility] office at [phone] if you have any questions, problems or concerns.

## 2012-08-20 NOTE — Progress Notes (Signed)
Nicole Harding Date of Birth: July 12, 1924 Medical Record #[id number]  History of Present Illness: Nicole Harding is seen back today for a one month check. She is seen for Dr. Patty Sermons. She has numerous issues which include HTN, IBS and stage 4 CKD. Normal nuclear stress test back in 2003 per Dr. Jenne Campus.   Hospitalized back in November with being unresponsive. This occurred in the context of a UTI with an associated fall which led to a subdural hematoma. Off of all anticoagulation indefinitely. Does have a filter in place. Had previously been on Coumadin for a DVT with PE just weeks prior to this event.   I saw her last month. She was getting ready to be discharged from [facility]. Was getting stronger.   She comes back today. She is here with her husband. She is doing pretty well. Has been home a little over 2 weeks. Getting stronger. PT will stop coming after this week. She is a "little slow" but not falling. Using a walker. No chest pain. BP has been good and she has parameters to take an extra Hydralazine prn reading of 150. They have not had to take any extra medicines. She has been released from Dr. Earl Gala care.   Current Outpatient Prescriptions on File Prior to Visit  Medication Sig Dispense Refill  . hydrALAZINE (APRESOLINE) 25 MG tablet Take 1 tablet (25 mg total) by mouth 4 (four) times daily.      . isosorbide mononitrate (IMDUR) 30 MG 24 hr tablet Take 1 tablet (30 mg total) by mouth daily.      . metoprolol succinate (TOPROL-XL) 25 MG 24 hr tablet Take 25 mg by mouth 2 (two) times daily. If  bp goes above 150 take an extra pill      . Multiple Vitamin (MULITIVITAMIN WITH MINERALS) TABS Take 1 tablet by mouth daily.      Marland Kitchen neomycin-bacitracin-polymyxin (NEOSPORIN) OINT Apply 1 application topically daily.      Marland Kitchen oxybutynin (DITROPAN-XL) 5 MG 24 hr tablet Take 1 tablet (5 mg total) by mouth at bedtime.  30 tablet  0  . sertraline (ZOLOFT) 25 MG tablet Take 1 tablet (25 mg  total) by mouth at bedtime.  30 tablet      Allergies  Allergen Reactions  . Amlodipine Nausea And Vomiting and Swelling    Swelling --sick and ended up in hospital N&V  . Codeine Nausea And Vomiting  . Lipitor (Atorvastatin Calcium) Other (See Comments)    "cramps my legs"  . Lisinopril Other (See Comments)    unknown  . Red Yeast Rice Other (See Comments)    unknown  . Zetia (Ezetimibe) Other (See Comments)    unknown    Past Medical History  Diagnosis Date  . Hypertension   . History of orthostatic hypotension   . IBS (irritable bowel syndrome)   . Parkinson's disease     ATYPICAL  . Dizziness   . Vertigo   . Fatigue   . Advanced age   . HLD (hyperlipidemia)     intolerant to lipid lowering drugs  . Normal nuclear stress test 2003  . Fall at home 05/2012    mechanical fall  (06/01/2012)  . Pulmonary embolism 05/31/2012    off coumadin due to fall/SDH; has filter in place  . Chronic kidney disease, stage 4 (severe)   . Exertional dyspnea   . Arthritis     "legs, fingers" (06/01/2012)  . Subdural hematoma     Past Surgical History  Procedure Date  . Small intestine surgery   . Hemorrhoid surgery 1970    "have4 had accidents ever since" (06/01/2012)  . Abdominal hysterectomy 1970's  . Tonsillectomy 1954  . Cholecystectomy 1955?  Marland Kitchen Breast cyst excision 1970's    left  . Cataract extraction w/ intraocular lens  implant, bilateral ? 1990's  . Shoulder open rotator cuff repair 1990?    right    History  Smoking status  . Former Smoker -- 1.0 packs/day for 20 years  . Types: Cigarettes  . Quit date: 02/14/1971  Smokeless tobacco  . Never Used    History  Alcohol Use No    Family History  Problem Relation Age of Onset  . Stroke Father     Review of Systems: The review of systems is per the HPI.  All other systems were reviewed and are negative.  Physical Exam: BP 140/70  Pulse 68  Ht 5\' 7"  (1.702 m)  Wt 151 lb 12.8 oz (68.856 kg)  BMI  23.78 kg/m2 Patient is very pleasant and in no acute distress. Using a walker. Seems unsteady. Skin is warm and dry. Color is normal.  HEENT is unremarkable. Normocephalic/atraumatic. PERRL. Sclera are nonicteric. Neck is supple. No masses. No JVD. Lungs are clear. Cardiac exam shows a regular rate and rhythm. Abdomen is soft. Extremities are without edema. Gait and ROM are intact. No gross neurologic deficits noted.  LABORATORY DATA:    Chemistry      Component Value Date/Time   NA 137 06/24/2012 0700   K 3.9 06/24/2012 0700   CL 109 06/24/2012 0700   CO2 17* 06/24/2012 0700   BUN 20 06/24/2012 0700   CREATININE 1.58* 06/24/2012 0700      Component Value Date/Time   CALCIUM 7.8* 06/24/2012 0700   ALKPHOS 67 06/18/2012 1940   AST 35 06/18/2012 1940   ALT 20 06/18/2012 1940   BILITOT 0.6 06/18/2012 1940     Lab Results  Component Value Date   WBC 10.5 06/23/2012   HGB 10.0* 06/23/2012   HCT 30.3* 06/23/2012   MCV 90.4 06/23/2012   PLT 442* 06/23/2012    Assessment / Plan: 1. Recent subdural hematoma - off of all anticoagulation. Doing well and making progress. Has been released from neurosurgery.  2. HTN - blood pressure looks ok.  3. PE - off all anticoagulation. Has a filter in place  Overall, she does look stronger. No change in her current therapy. See Dr. Patty Sermons in about 6 weeks.   Patient is agreeable to this plan and will call if any problems develop in the interim.

## 2012-08-21 ENCOUNTER — Other Ambulatory Visit: Payer: Self-pay | Admitting: Nurse Practitioner

## 2012-08-23 ENCOUNTER — Other Ambulatory Visit: Payer: Self-pay | Admitting: *Deleted

## 2012-08-23 ENCOUNTER — Other Ambulatory Visit: Payer: Self-pay | Admitting: Cardiology

## 2012-08-23 DIAGNOSIS — Z1231 Encounter for screening mammogram for malignant neoplasm of breast: Secondary | ICD-10-CM

## 2012-08-23 MED ORDER — ISOSORBIDE MONONITRATE ER 30 MG PO TB24: 30.0000 mg | ORAL_TABLET | Freq: Every day | ORAL | Status: DC

## 2012-08-23 NOTE — Telephone Encounter (Signed)
Fax Received. Refill Completed. Annleigh Knueppel Chowoe (R.M.A)   

## 2012-08-29 ENCOUNTER — Telehealth: Payer: Self-pay | Admitting: Cardiology

## 2012-08-29 ENCOUNTER — Other Ambulatory Visit: Payer: Self-pay | Admitting: *Deleted

## 2012-08-29 MED ORDER — HYDRALAZINE HCL 50 MG PO TABS: 50.0000 mg | ORAL_TABLET | Freq: Three times a day (TID) | ORAL | Status: DC

## 2012-08-29 NOTE — Telephone Encounter (Signed)
HH nurse reports elevated BP for the last 2 weeks.  Per Norma Fredrickson change Hydralazine to 50mg  TID.  Orders given to Aniceto Boss, Genevieve Norlander [facility] Nurse.

## 2012-08-29 NOTE — Telephone Encounter (Signed)
New problem:   Home health nurse calling.   C/o today  196/86. Last week 186/84. Please advise

## 2012-08-31 ENCOUNTER — Telehealth: Payer: Self-pay

## 2012-09-03 ENCOUNTER — Other Ambulatory Visit: Payer: Self-pay

## 2012-09-03 NOTE — Telephone Encounter (Signed)
Pt rtn your call

## 2012-09-24 ENCOUNTER — Ambulatory Visit
Admission: RE | Admit: 2012-09-24 | Discharge: 2012-09-24 | Disposition: A | Discharge status: Home / Self Care | Payer: Medicare Other | Source: Ambulatory Visit | Attending: Cardiology | Admitting: Cardiology

## 2012-09-24 ENCOUNTER — Other Ambulatory Visit: Payer: Self-pay | Admitting: Cardiology

## 2012-09-24 DIAGNOSIS — R928 Other abnormal and inconclusive findings on diagnostic imaging of breast: Secondary | ICD-10-CM

## 2012-09-24 DIAGNOSIS — Z1231 Encounter for screening mammogram for malignant neoplasm of breast: Secondary | ICD-10-CM

## 2012-09-26 ENCOUNTER — Other Ambulatory Visit: Payer: Self-pay | Admitting: Radiology

## 2012-10-01 ENCOUNTER — Encounter: Payer: Self-pay | Admitting: Cardiology

## 2012-10-01 ENCOUNTER — Ambulatory Visit (INDEPENDENT_AMBULATORY_CARE_PROVIDER_SITE_OTHER): Payer: Medicare Other | Admitting: Cardiology

## 2012-10-01 VITALS — BP 148/76 | HR 60 | Ht 67.0 in | Wt 152.4 lb

## 2012-10-01 DIAGNOSIS — S065X9S Traumatic subdural hemorrhage with loss of consciousness of unspecified duration, sequela: Secondary | ICD-10-CM

## 2012-10-01 DIAGNOSIS — Z7901 Long term (current) use of anticoagulants: Secondary | ICD-10-CM

## 2012-10-01 DIAGNOSIS — R5381 Other malaise: Secondary | ICD-10-CM

## 2012-10-01 DIAGNOSIS — I6200 Nontraumatic subdural hemorrhage, unspecified: Secondary | ICD-10-CM

## 2012-10-01 DIAGNOSIS — S069X9S Unspecified intracranial injury with loss of consciousness of unspecified duration, sequela: Secondary | ICD-10-CM

## 2012-10-01 DIAGNOSIS — R5383 Other fatigue: Secondary | ICD-10-CM

## 2012-10-01 DIAGNOSIS — I119 Hypertensive heart disease without heart failure: Secondary | ICD-10-CM

## 2012-10-01 DIAGNOSIS — E7800 Pure hypercholesterolemia, unspecified: Secondary | ICD-10-CM

## 2012-10-01 DIAGNOSIS — S065X9A Traumatic subdural hemorrhage with loss of consciousness of unspecified duration, initial encounter: Secondary | ICD-10-CM

## 2012-10-01 NOTE — Progress Notes (Signed)
Nigel Bridgeman Date of Birth:  [birth year] [facility] [address] [place], [place]  [address] [phone]         Fax   [phone]  History of Present Illness: This pleasant 79 year old woman is seen for a scheduled followup office visit. She has a past history of high blood pressure associated with lightheadedness and dizziness. She has a history of irritable bowel syndrome and history of chronic renal insufficiency. She does not have any history of ischemic heart disease and she had a normal nuclear stress test in 2003 by Dr. Jenne Campus. She has a history of a remote echocardiogram by Dr. Guadlupe Spanish which was unremarkable. Since her last saw her she was placed on hydralazine for poorly controlled blood pressure. She is on 25 mg twice a day. The patient has a history of postmenopausal state with a bothersome night sweats and hot flashes. She has had a hysterectomy and she gets regular mammograms.  She has a past history of subarachnoid hemorrhage and is making a good recovery.   Current Outpatient Prescriptions  Medication Sig Dispense Refill  . estrogens, conjugated, (PREMARIN) 0.3 MG tablet Take 0.3 mg by mouth daily. Take daily for 21 days then do not take for 7 days.      . hydrALAZINE (APRESOLINE) 25 MG tablet Take 25 mg by mouth 4 (four) times daily.      . isosorbide mononitrate (IMDUR) 30 MG 24 hr tablet Take 1 tablet (30 mg total) by mouth daily.  30 tablet  5  . metoprolol succinate (TOPROL-XL) 25 MG 24 hr tablet Take 25 mg by mouth 2 (two) times daily. If  bp goes above 150 take an extra pill      . Multiple Vitamin (MULITIVITAMIN WITH MINERALS) TABS Take 1 tablet by mouth daily.      . Multiple Vitamins-Minerals (ICAPS) CAPS Take by mouth at bedtime.      Marland Kitchen neomycin-bacitracin-polymyxin (NEOSPORIN) OINT Apply 1 application topically daily.      Marland Kitchen oxybutynin (DITROPAN-XL) 5 MG 24 hr tablet Take 1 tablet (5 mg total) by mouth at bedtime.  30 tablet  0  .  sertraline (ZOLOFT) 25 MG tablet Take 1 tablet (25 mg total) by mouth at bedtime.  30 tablet     No current facility-administered medications for this visit.    Allergies  Allergen Reactions  . Amlodipine Nausea And Vomiting and Swelling    Swelling --sick and ended up in hospital N&V  . Codeine Nausea And Vomiting  . Lipitor (Atorvastatin Calcium) Other (See Comments)    "cramps my legs"  . Lisinopril Other (See Comments)    unknown  . Red Yeast Rice Other (See Comments)    unknown  . Zetia (Ezetimibe) Other (See Comments)    unknown    Patient Active Problem List  Diagnosis  . History of orthostatic hypotension  . Irritable bowel syndrome  . Postmenopausal state  . Vertigo  . Pure hypercholesterolemia  . Benign hypertensive heart disease without heart failure  . Dizziness - light-headed  . Chronic renal insufficiency, stage III (moderate)  . Pulmonary embolism  . UTI (urinary tract infection)  . Pain in left shoulder   . Nausea  . CKD (chronic kidney disease) stage 4, GFR 15-29 ml/min  . HTN (hypertension)  . Accelerated hypertension  . Fall-recurrent  . Rigors  . Cervical myelopathy  . SDH (subdural hematoma)  . Metabolic encephalopathy  . Sepsis due to Pseudomonas  . History of pulmonary  embolism  . Warfarin anticoagulation  . UTI (lower urinary tract infection) due to pseudomonas    History  Smoking status  . Former Smoker -- 1.00 packs/day for 20 years  . Types: Cigarettes  . Quit date: 02/14/1971  Smokeless tobacco  . Never Used    History  Alcohol Use No    Family History  Problem Relation Age of Onset  . Stroke Father     Review of Systems: Constitutional: no fever chills diaphoresis or fatigue or change in weight.  Head and neck: no hearing loss, no epistaxis, no photophobia or visual disturbance. Respiratory: No cough, shortness of breath or wheezing. Cardiovascular: No chest pain peripheral edema, palpitations. Gastrointestinal: No  abdominal distention, no abdominal pain, no change in bowel habits hematochezia or melena. Genitourinary: No dysuria, no frequency, no urgency, no nocturia. Musculoskeletal:No arthralgias, no back pain, no gait disturbance or myalgias. Neurological: No dizziness, no headaches, no numbness, no seizures, no syncope, no weakness, no tremors. Hematologic: No lymphadenopathy, no easy bruising. Psychiatric: No confusion, no hallucinations, no sleep disturbance.    Physical Exam: Filed Vitals:   10/01/12 1104  BP: 148/76  Pulse: 60   the general appearance reveals a well-developed alert woman in no distress.The head and neck exam reveals pupils equal and reactive.  Extraocular movements are full.  There is no scleral icterus.  The mouth and pharynx are normal.  The neck is supple.  The carotids reveal no bruits.  The jugular venous pressure is normal.  The  thyroid is not enlarged.  There is no lymphadenopathy.  The chest is clear to percussion and auscultation.  There are no rales or rhonchi.  Expansion of the chest is symmetrical.  The precordium is quiet.  The first heart sound is normal.  The second heart sound is physiologically split.  There is no murmur gallop rub or click.  There is no abnormal lift or heave.  The abdomen is soft and nontender.  The bowel sounds are normal.  The liver and spleen are not enlarged.  There are no abdominal masses.  There are no abdominal bruits.  Extremities reveal good pedal pulses.  There is no phlebitis or edema.  There is no cyanosis or clubbing.  Strength is normal and symmetrical in all extremities.  There is no lateralizing weakness.  There are no sensory deficits.  The skin is warm and dry.  There is no rash.     Assessment / Plan: Continue on present medication.  She did not come fasting today.  She will return soon for fasting lipid panel hepatic function panel basal metabolic panel and CBC. Recheck in 3 months for followup office visit and  EKG.

## 2012-10-01 NOTE — Assessment & Plan Note (Signed)
The patient is not having any significant headaches now.  She has only occasional dizziness.  No syncope.  She states that she took herself off of sertraline and is sleeping okay without any pharmacologic help

## 2012-10-01 NOTE — Assessment & Plan Note (Signed)
The patient is permanently off of anticoagulation.  This is because of her subdural hematoma.  She does have an inferior vena cava filter in place.  She has a prior history of pulmonary embolism.  She has not had any symptoms to suggest recurrent emboli and she is more physically active now.

## 2012-10-01 NOTE — Patient Instructions (Addendum)
Return soon for fasting labs (lp/bmet/hfp/cbc)  Your physician recommends that you continue on your current medications as directed. Please refer to the Current Medication list given to you today.  Your physician recommends that you schedule a follow-up appointment in: 3 month ov/ekg

## 2012-10-01 NOTE — Assessment & Plan Note (Signed)
Blood pressure was remaining stable on current medication.  She has had only occasional minimal dizziness.  He is not having any symptoms of CHF.  She denies any chest pain.  Her overall strength has been improving over the past several months and she is able to walk further without getting short of breath.  She no longer has home oxygen because she no longer needs it

## 2012-10-04 ENCOUNTER — Ambulatory Visit
Admission: RE | Admit: 2012-10-04 | Discharge: 2012-10-04 | Disposition: A | Discharge status: Home / Self Care | Payer: Medicare Other | Source: Ambulatory Visit | Attending: Cardiology | Admitting: Cardiology

## 2012-10-04 DIAGNOSIS — R928 Other abnormal and inconclusive findings on diagnostic imaging of breast: Secondary | ICD-10-CM

## 2012-10-10 ENCOUNTER — Other Ambulatory Visit: Payer: Self-pay

## 2012-10-10 MED ORDER — HYDRALAZINE HCL 25 MG PO TABS: 25.0000 mg | ORAL_TABLET | Freq: Four times a day (QID) | ORAL | Status: DC

## 2012-10-11 ENCOUNTER — Other Ambulatory Visit (INDEPENDENT_AMBULATORY_CARE_PROVIDER_SITE_OTHER): Payer: Medicare Other

## 2012-10-11 DIAGNOSIS — R5383 Other fatigue: Secondary | ICD-10-CM

## 2012-10-11 DIAGNOSIS — R5381 Other malaise: Secondary | ICD-10-CM

## 2012-10-11 DIAGNOSIS — I119 Hypertensive heart disease without heart failure: Secondary | ICD-10-CM

## 2012-10-11 DIAGNOSIS — E7800 Pure hypercholesterolemia, unspecified: Secondary | ICD-10-CM

## 2012-10-11 LAB — BASIC METABOLIC PANEL
CO2: 17 mEq/L — ABNORMAL LOW (ref 19–32)
GFR: 28.51 mL/min — ABNORMAL LOW (ref >60.00)
Glucose, Bld: 93 mg/dL (ref 70–99)
Potassium: 4.1 mEq/L (ref 3.5–5.1)
Sodium: 139 mEq/L (ref 135–145)

## 2012-10-11 LAB — CBC WITH DIFFERENTIAL/PLATELET
Basophils Absolute: 0.1 10*3/uL (ref 0.0–0.1)
Basophils Relative: 0.8 % (ref 0.0–3.0)
HCT: 38.7 % (ref 36.0–46.0)
Hemoglobin: 12.9 g/dL (ref 12.0–15.0)
Lymphocytes Relative: 22.6 % (ref 12.0–46.0)
Lymphs Abs: 2 10*3/uL (ref 0.7–4.0)
Monocytes Relative: 7 % (ref 3.0–12.0)
Neutro Abs: 6 10*3/uL (ref 1.4–7.7)
RBC: 4.45 Mil/uL (ref 3.87–5.11)
RDW: 14.9 % — ABNORMAL HIGH (ref 11.5–14.6)

## 2012-10-11 LAB — HEPATIC FUNCTION PANEL
ALT: 15 U/L (ref 0–35)
Albumin: 3.6 g/dL (ref 3.5–5.2)
Total Bilirubin: 0.4 mg/dL (ref 0.3–1.2)

## 2012-10-11 LAB — LIPID PANEL
Cholesterol: 232 mg/dL — ABNORMAL HIGH (ref 0–200)
HDL: 47.8 mg/dL (ref >39.00)
Total CHOL/HDL Ratio: 5
Triglycerides: 301 mg/dL — ABNORMAL HIGH (ref 0.0–149.0)

## 2012-10-16 ENCOUNTER — Telehealth: Payer: Self-pay | Admitting: *Deleted

## 2012-10-16 NOTE — Telephone Encounter (Signed)
Advised patient of lab results  

## 2012-10-16 NOTE — Telephone Encounter (Signed)
Message copied by Burnell Blanks on Tue Oct 16, 2012  4:36 PM ------      Message from: [name]      Created: Thu Oct 11, 2012 10:10 PM       Cholesterol better but still high.  Allergic to statins, zetia, etc. Continue careful diet.      LFTs good.      Anemia is no longer present.      Kidneys dry so drink plenty of water. ------

## 2012-11-02 ENCOUNTER — Other Ambulatory Visit: Payer: Self-pay | Admitting: *Deleted

## 2012-11-02 MED ORDER — METOPROLOL SUCCINATE ER 25 MG PO TB24: 25.0000 mg | ORAL_TABLET | Freq: Two times a day (BID) | ORAL | Status: DC

## 2012-11-03 ENCOUNTER — Encounter (HOSPITAL_COMMUNITY): Payer: Self-pay | Admitting: Emergency Medicine

## 2012-11-03 ENCOUNTER — Emergency Department (HOSPITAL_COMMUNITY)
Admission: EM | Admit: 2012-11-03 | Discharge: 2012-11-03 | Disposition: A | Discharge status: Home / Self Care | Payer: Medicare Other | Attending: Emergency Medicine | Admitting: Emergency Medicine

## 2012-11-03 DIAGNOSIS — R531 Weakness: Secondary | ICD-10-CM

## 2012-11-03 DIAGNOSIS — G20 Parkinson's disease: Secondary | ICD-10-CM | POA: Insufficient documentation

## 2012-11-03 DIAGNOSIS — Y939 Activity, unspecified: Secondary | ICD-10-CM | POA: Insufficient documentation

## 2012-11-03 DIAGNOSIS — Z86711 Personal history of pulmonary embolism: Secondary | ICD-10-CM | POA: Insufficient documentation

## 2012-11-03 DIAGNOSIS — R630 Anorexia: Secondary | ICD-10-CM | POA: Insufficient documentation

## 2012-11-03 DIAGNOSIS — I129 Hypertensive chronic kidney disease with stage 1 through stage 4 chronic kidney disease, or unspecified chronic kidney disease: Secondary | ICD-10-CM | POA: Insufficient documentation

## 2012-11-03 DIAGNOSIS — Z8679 Personal history of other diseases of the circulatory system: Secondary | ICD-10-CM | POA: Insufficient documentation

## 2012-11-03 DIAGNOSIS — Z79899 Other long term (current) drug therapy: Secondary | ICD-10-CM | POA: Insufficient documentation

## 2012-11-03 DIAGNOSIS — Y929 Unspecified place or not applicable: Secondary | ICD-10-CM | POA: Insufficient documentation

## 2012-11-03 DIAGNOSIS — Z8639 Personal history of other endocrine, nutritional and metabolic disease: Secondary | ICD-10-CM | POA: Insufficient documentation

## 2012-11-03 DIAGNOSIS — R5381 Other malaise: Secondary | ICD-10-CM | POA: Insufficient documentation

## 2012-11-03 DIAGNOSIS — R197 Diarrhea, unspecified: Secondary | ICD-10-CM | POA: Insufficient documentation

## 2012-11-03 DIAGNOSIS — R296 Repeated falls: Secondary | ICD-10-CM | POA: Insufficient documentation

## 2012-11-03 DIAGNOSIS — G20A1 Parkinson's disease without dyskinesia, without mention of fluctuations: Secondary | ICD-10-CM | POA: Insufficient documentation

## 2012-11-03 DIAGNOSIS — Z8719 Personal history of other diseases of the digestive system: Secondary | ICD-10-CM | POA: Insufficient documentation

## 2012-11-03 DIAGNOSIS — E869 Volume depletion, unspecified: Secondary | ICD-10-CM

## 2012-11-03 DIAGNOSIS — S51009A Unspecified open wound of unspecified elbow, initial encounter: Secondary | ICD-10-CM | POA: Insufficient documentation

## 2012-11-03 DIAGNOSIS — Z87891 Personal history of nicotine dependence: Secondary | ICD-10-CM | POA: Insufficient documentation

## 2012-11-03 DIAGNOSIS — N184 Chronic kidney disease, stage 4 (severe): Secondary | ICD-10-CM | POA: Insufficient documentation

## 2012-11-03 DIAGNOSIS — Z87828 Personal history of other (healed) physical injury and trauma: Secondary | ICD-10-CM | POA: Insufficient documentation

## 2012-11-03 DIAGNOSIS — Z862 Personal history of diseases of the blood and blood-forming organs and certain disorders involving the immune mechanism: Secondary | ICD-10-CM | POA: Insufficient documentation

## 2012-11-03 DIAGNOSIS — Z8739 Personal history of other diseases of the musculoskeletal system and connective tissue: Secondary | ICD-10-CM | POA: Insufficient documentation

## 2012-11-03 LAB — CBC
HCT: 38.1 % (ref 36.0–46.0)
MCHC: 33.3 g/dL (ref 30.0–36.0)
MCV: 85 fL (ref 78.0–100.0)
Platelets: 291 10*3/uL (ref 150–400)
RDW: 15 % (ref 11.5–15.5)
WBC: 11.7 10*3/uL — ABNORMAL HIGH (ref 4.0–10.5)

## 2012-11-03 LAB — BASIC METABOLIC PANEL
BUN: 33 mg/dL — ABNORMAL HIGH (ref 6–23)
CO2: 16 mEq/L — ABNORMAL LOW (ref 19–32)
Calcium: 8.8 mg/dL (ref 8.4–10.5)
Creatinine, Ser: 1.86 mg/dL — ABNORMAL HIGH (ref 0.50–1.10)
Glucose, Bld: 103 mg/dL — ABNORMAL HIGH (ref 70–99)

## 2012-11-03 LAB — GLUCOSE, CAPILLARY: Glucose-Capillary: 96 mg/dL (ref 70–99)

## 2012-11-03 NOTE — ED Provider Notes (Signed)
History     CSN: [id number]  Arrival date & time 11/03/12  1405   First MD Initiated Contact with Patient 11/03/12 1411      Chief Complaint  Patient presents with  . Near Syncope    (Consider location/radiation/quality/duration/timing/severity/associated sxs/prior treatment) HPI Patient reports today she was walking and standing and felt somewhat lightheaded and had a near-syncopal episode.  No syncope.  No preceding chest pain or palpitations.  At this time she has no complaints.  She's had mild decreased oral intake over the past several days.  She denies diarrhea nausea and vomiting.  She reports her appetite has been less.  When she fell her husband grabbed her and she has a small skin tear to her right lateral elbow.  She specifically denies any symptoms at this time.  She has a history of similar episodes before and is being followed closely by her cardiologist with this.  She has a blood pressure medications regularly.  She is on Imdur and metoprolol.  She is no longer on anticoagulants.  She's had some diarrhea and has history of IBS.   Past Medical History  Diagnosis Date  . Hypertension   . History of orthostatic hypotension   . IBS (irritable bowel syndrome)   . Parkinson's disease     ATYPICAL  . Dizziness   . Vertigo   . Fatigue   . Advanced age   . HLD (hyperlipidemia)     intolerant to lipid lowering drugs  . Normal nuclear stress test 2003  . Fall at home 05/2012    mechanical fall  (06/01/2012)  . Pulmonary embolism 05/31/2012    off coumadin due to fall/SDH; has filter in place  . Chronic kidney disease, stage 4 (severe)   . Exertional dyspnea   . Arthritis     "legs, fingers" (06/01/2012)  . Subdural hematoma     Past Surgical History  Procedure Laterality Date  . Small intestine surgery    . Hemorrhoid surgery  1970    "have4 had accidents ever since" (06/01/2012)  . Abdominal hysterectomy  1970's  . Tonsillectomy  1954  . Cholecystectomy  1955?   Marland Kitchen Breast cyst excision  1970's    left  . Cataract extraction w/ intraocular lens  implant, bilateral  ? 1990's  . Shoulder open rotator cuff repair  1990?    right    Family History  Problem Relation Age of Onset  . Stroke Father     History  Substance Use Topics  . Smoking status: Former Smoker -- 1.00 packs/day for 20 years    Types: Cigarettes    Quit date: 02/14/1971  . Smokeless tobacco: Never Used  . Alcohol Use: No    OB History   Grav Para Term Preterm Abortions TAB SAB Ect Mult Living                  Review of Systems  All other systems reviewed and are negative.    Allergies  Amlodipine; Codeine; Lipitor; Lisinopril; Red yeast rice; and Zetia  Home Medications   Current Outpatient Rx  Name  Route  Sig  Dispense  Refill  . estrogens, conjugated, (PREMARIN) 0.3 MG tablet   Oral   Take 0.3 mg by mouth daily. Take daily for 21 days then do not take for 7 days.         . hydrALAZINE (APRESOLINE) 25 MG tablet   Oral   Take 1 tablet (25 mg total) by mouth  4 (four) times daily.   360 tablet   3   . isosorbide mononitrate (IMDUR) 30 MG 24 hr tablet   Oral   Take 1 tablet (30 mg total) by mouth daily.   30 tablet   5   . metoprolol succinate (TOPROL-XL) 25 MG 24 hr tablet   Oral   Take 1 tablet (25 mg total) by mouth 2 (two) times daily. If  bp goes above 150 take an extra pill   60 tablet   5   . Multiple Vitamin (MULITIVITAMIN WITH MINERALS) TABS   Oral   Take 1 tablet by mouth daily.         . Multiple Vitamins-Minerals (ICAPS) CAPS   Oral   Take by mouth at bedtime.         Marland Kitchen neomycin-bacitracin-polymyxin (NEOSPORIN) OINT   Topical   Apply 1 application topically daily.         Marland Kitchen oxybutynin (DITROPAN-XL) 5 MG 24 hr tablet   Oral   Take 1 tablet (5 mg total) by mouth at bedtime.   30 tablet   0   . sertraline (ZOLOFT) 25 MG tablet   Oral   Take 1 tablet (25 mg total) by mouth at bedtime.   30 tablet        BP 137/97   Temp(Src) 97.1 F (36.2 C) (Rectal)  Resp 18  SpO2 95%  Physical Exam  Nursing note and vitals reviewed. Constitutional: She is oriented to person, place, and time. She appears well-developed and well-nourished. No distress.  HENT:  Head: Normocephalic and atraumatic.  Eyes: EOM are normal.  Neck: Normal range of motion.  Cardiovascular: Normal rate, regular rhythm and normal heart sounds.   Pulmonary/Chest: Effort normal and breath sounds normal.  Abdominal: Soft. She exhibits no distension. There is no tenderness.  Musculoskeletal: Normal range of motion.  Small skin tear right lateral L. lower with full range of motion of right elbow.  Normal strength in right hand.  Normal pulses in right radial artery  Neurological: She is alert and oriented to person, place, and time.  Skin: Skin is warm and dry.  Psychiatric: She has a normal mood and affect. Judgment normal.    ED Course  Procedures (including critical care time)   Date: 11/03/2012  Rate: 69  Rhythm: normal sinus rhythm  QRS Axis: normal  Intervals: normal  ST/T Wave abnormalities: normal  Conduction Disutrbances: none  Narrative Interpretation:   Old EKG Reviewed: No significant changes noted     Labs Reviewed  CBC - Abnormal; Notable for the following:    WBC 11.7 (*)    All other components within normal limits  BASIC METABOLIC PANEL - Abnormal; Notable for the following:    Sodium 134 (*)    CO2 16 (*)    Glucose, Bld 103 (*)    BUN 33 (*)    Creatinine, Ser 1.86 (*)    GFR calc non Af Amer 23 (*)    GFR calc Af Amer 27 (*)    All other components within normal limits  GLUCOSE, CAPILLARY  TROPONIN I  URINALYSIS, ROUTINE W REFLEX MICROSCOPIC   No results found.   1. Weakness   2. Volume depletion       MDM  Patient feels much better after IV fluids.  Likely volume depletion.  Discharge home in good condition.  Doubt ACS.  Doubt cardiac arrhythmia.  Doubt neurogenic syncope.  Nonspecific  changes in her bicarbonate level.  Her bicarbonate has been like this for months based on review of prior labs in the computer.  Please see medical record for complete details of these lab values.  The husband reports she feels much better and looks much better after IV fluids.  The patient agrees she feels much better.        Lyanne Co, MD 11/03/12 1900

## 2012-11-03 NOTE — ED Notes (Signed)
Per EMS: pt had near syncopal episode while at breakfast with husband this morning.  Has hx of same. Dr. Patty Sermons is "trying to find out why she is having these episodes." Pt's husband caught her before she fell. Small skin tear on R arm from near fall.

## 2012-11-03 NOTE — ED Notes (Signed)
Pt given discharge paperwork. Pt verbalized understanding of d/c and f/u.  No additional questions by pt.  VSS. Resps e/u.  Nad.  E-signature obtained.  Pt wheeled out of department in wheelchair by RN.

## 2012-11-03 NOTE — ED Notes (Signed)
Campos MD notified of temp

## 2012-11-05 ENCOUNTER — Telehealth: Payer: Self-pay | Admitting: Cardiology

## 2012-11-05 NOTE — Telephone Encounter (Signed)
Spoke with husband and he states she gets shortness of breath just moving around the house but is worse at night. Will fax [facility] info to try to get overnight study per  Dr. Patty Sermons

## 2012-11-05 NOTE — Telephone Encounter (Signed)
We would need to have her come in for office visit and check her oxygen to see if she qualifies for home oxygen.

## 2012-11-05 NOTE — Telephone Encounter (Signed)
New problem   Pt's husband stated pt fainted 11/03/12. Pt went to hospital and was checked out. Pt is having sob now.

## 2012-11-05 NOTE — Telephone Encounter (Signed)
Pt's husband called because he said, pt was out in a reataurant eating Saturday, when she was SOB weak, pt  fainted ; pt was taken to the ER. According to husband, pt was evaluated and every thing check out fine. Oxygen was given while in the ER. Husband think pt needs Oxygen at home, because pt is SOB again and weak. According to pt's husband pt just to have oxygen at home and it was taken away in January 2014.

## 2012-11-07 NOTE — Telephone Encounter (Signed)
New Problem:    Patient's husband called in wanting to know the status of his wife receiving her oxygen.  Please call back.

## 2012-11-07 NOTE — Telephone Encounter (Signed)
Advised husband  

## 2012-11-07 NOTE — Telephone Encounter (Signed)
Spoke with [facility] and refaxed information

## 2012-11-09 ENCOUNTER — Telehealth: Payer: Self-pay | Admitting: Cardiology

## 2012-11-09 NOTE — Telephone Encounter (Signed)
Nicole Harding returns call for night time oxygen order Nicole Harding has written order & was faxed today to [place] at [phone]  Mylo Red RN

## 2012-11-09 NOTE — Telephone Encounter (Signed)
New Problem:    Patient is qualified to begin nighttime oxygen.  She called in wanting to speak with the NP that [facility] left a message for, to send in the order for the patient's oxygen.  Please call back.

## 2012-11-12 NOTE — Telephone Encounter (Signed)
Patient qualified for oxygen and is currently using at night

## 2012-11-26 ENCOUNTER — Encounter: Payer: Self-pay | Admitting: Cardiology

## 2012-12-13 ENCOUNTER — Telehealth: Payer: Self-pay | Admitting: Cardiology

## 2012-12-13 NOTE — Telephone Encounter (Signed)
New Prob      Husband passed yesterday, and pt is needing a wheelchair and portable oxygen for the funeral. Would like to speak to nurse.

## 2012-12-13 NOTE — Telephone Encounter (Signed)
Returned call to patient's daughter Lupita Leash she stated her father just passed away and her mother needs a wheel chair and portable 02 to use at funeral home and funeral.Spoke to [place] at [facility] [phone].Order for wheel chair faxed to [phone].Greggory Stallion stated he would take wheel chair and portable 02 to patient's home this afternoon.

## 2013-01-04 ENCOUNTER — Ambulatory Visit: Payer: Medicare Other | Admitting: Cardiology

## 2013-02-12 ENCOUNTER — Telehealth: Payer: Self-pay | Admitting: Cardiology

## 2013-02-12 NOTE — Telephone Encounter (Signed)
Walk in pt Form " Came in for Nurse Visit to get O2 Checked" sent to [facility]  02/12/13/KM

## 2013-02-15 IMAGING — CR DG SHOULDER 2+V*L*
2 series · 2 of 2 positions shown · non-contrast
Comparison: None.

CLINICAL DATA: Contusion after fall.

LEFT SHOULDER - 2+ VIEW

[ap ext rot]
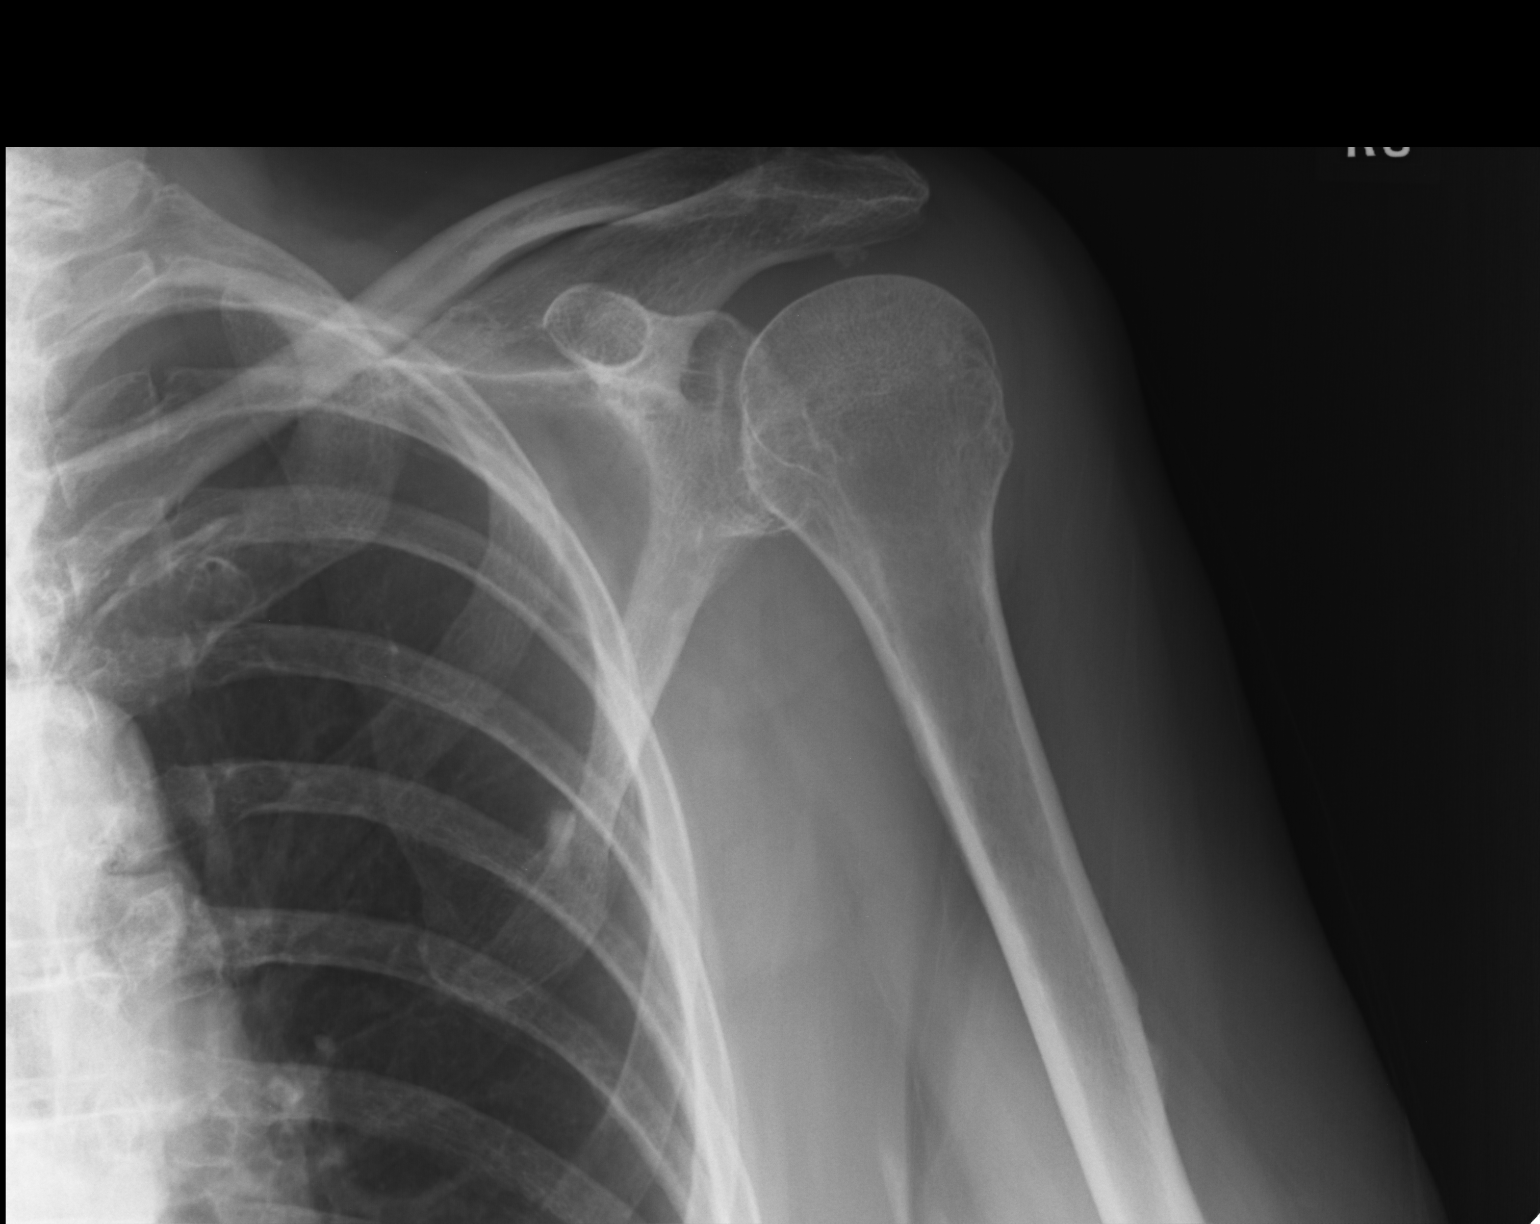

[ap int rot]
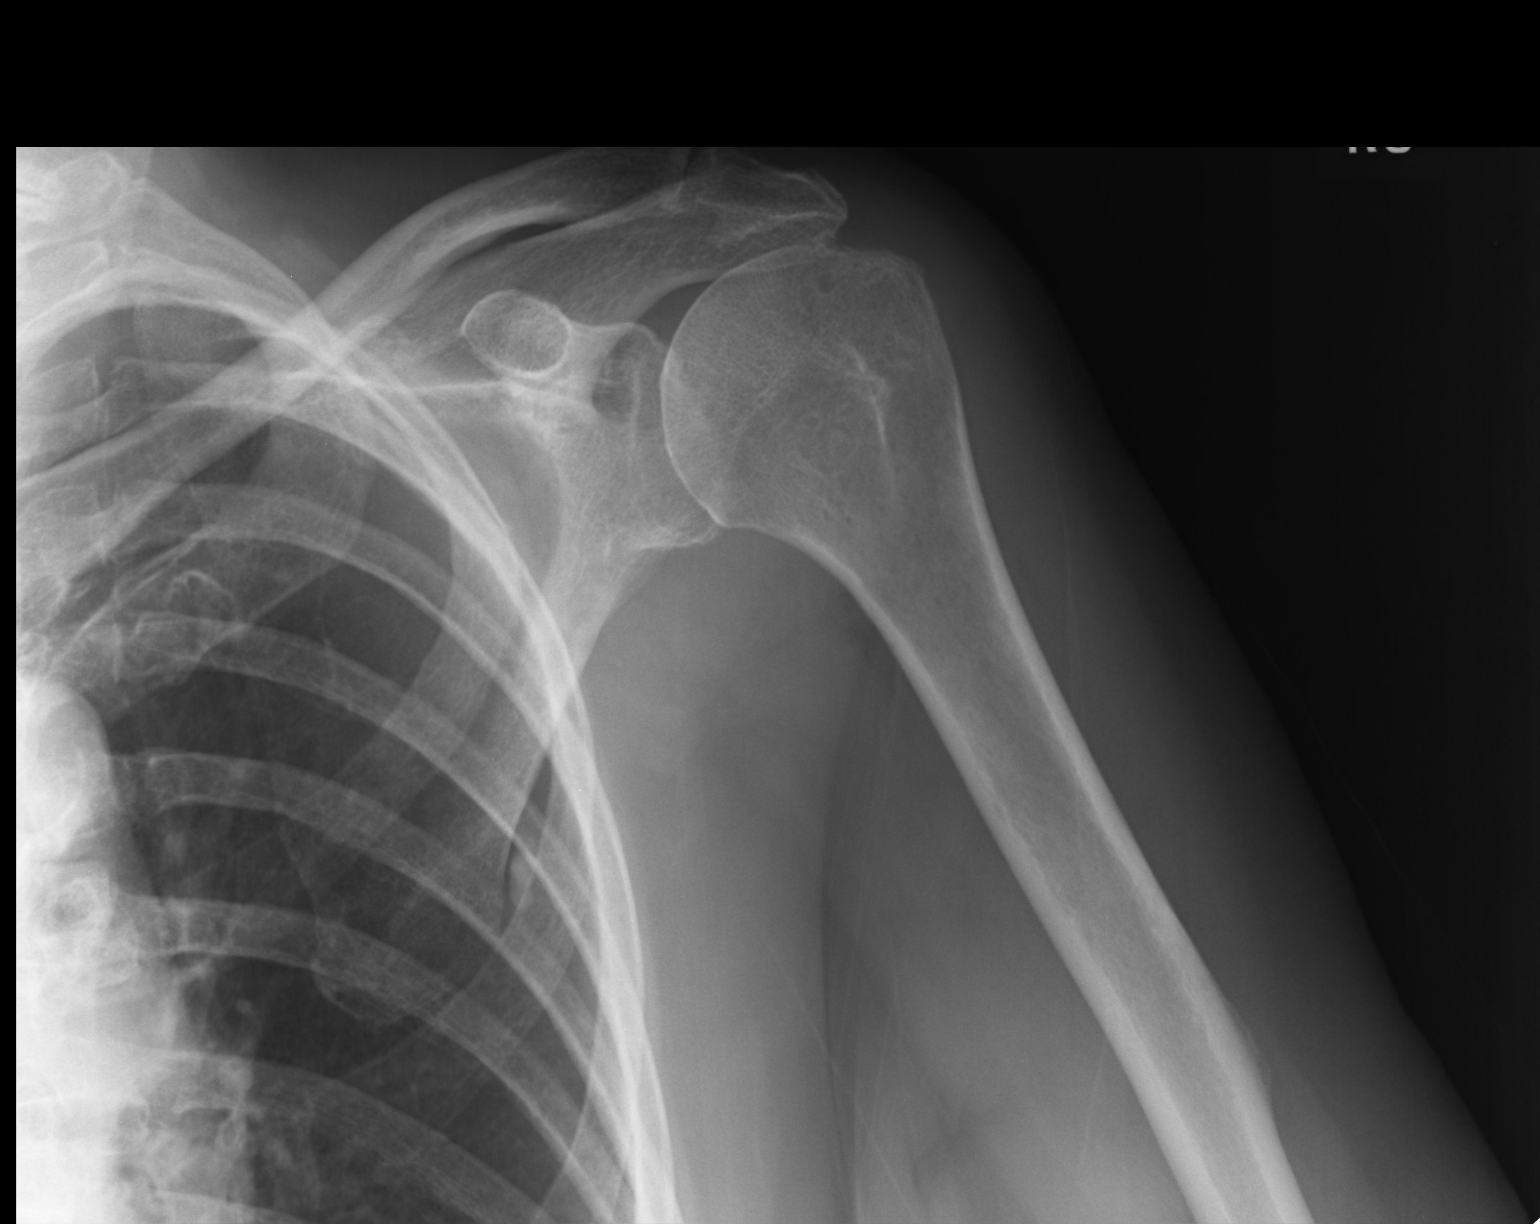

[2 of 2 positions shown; findings below may reference images not displayed]

FINDINGS: No fractures or dislocations.  Mild degenerative changes
in the acromioclavicular joint.  The glenohumeral joint is normally
preserved.  Small coarse calcification in the rotator interval.
Contiguous ribs are normal.
IMPRESSION: No acute skeletal trauma.

## 2013-02-18 ENCOUNTER — Other Ambulatory Visit: Payer: Self-pay | Admitting: Cardiology

## 2013-02-18 IMAGING — CR DG CHEST 2V
2 series · 2 of 2 positions shown · non-contrast
Comparison: 05/31/2012 and 08/15/2005

CLINICAL DATA: Chest pain.

CHEST - 2 VIEW

[w chest pa]
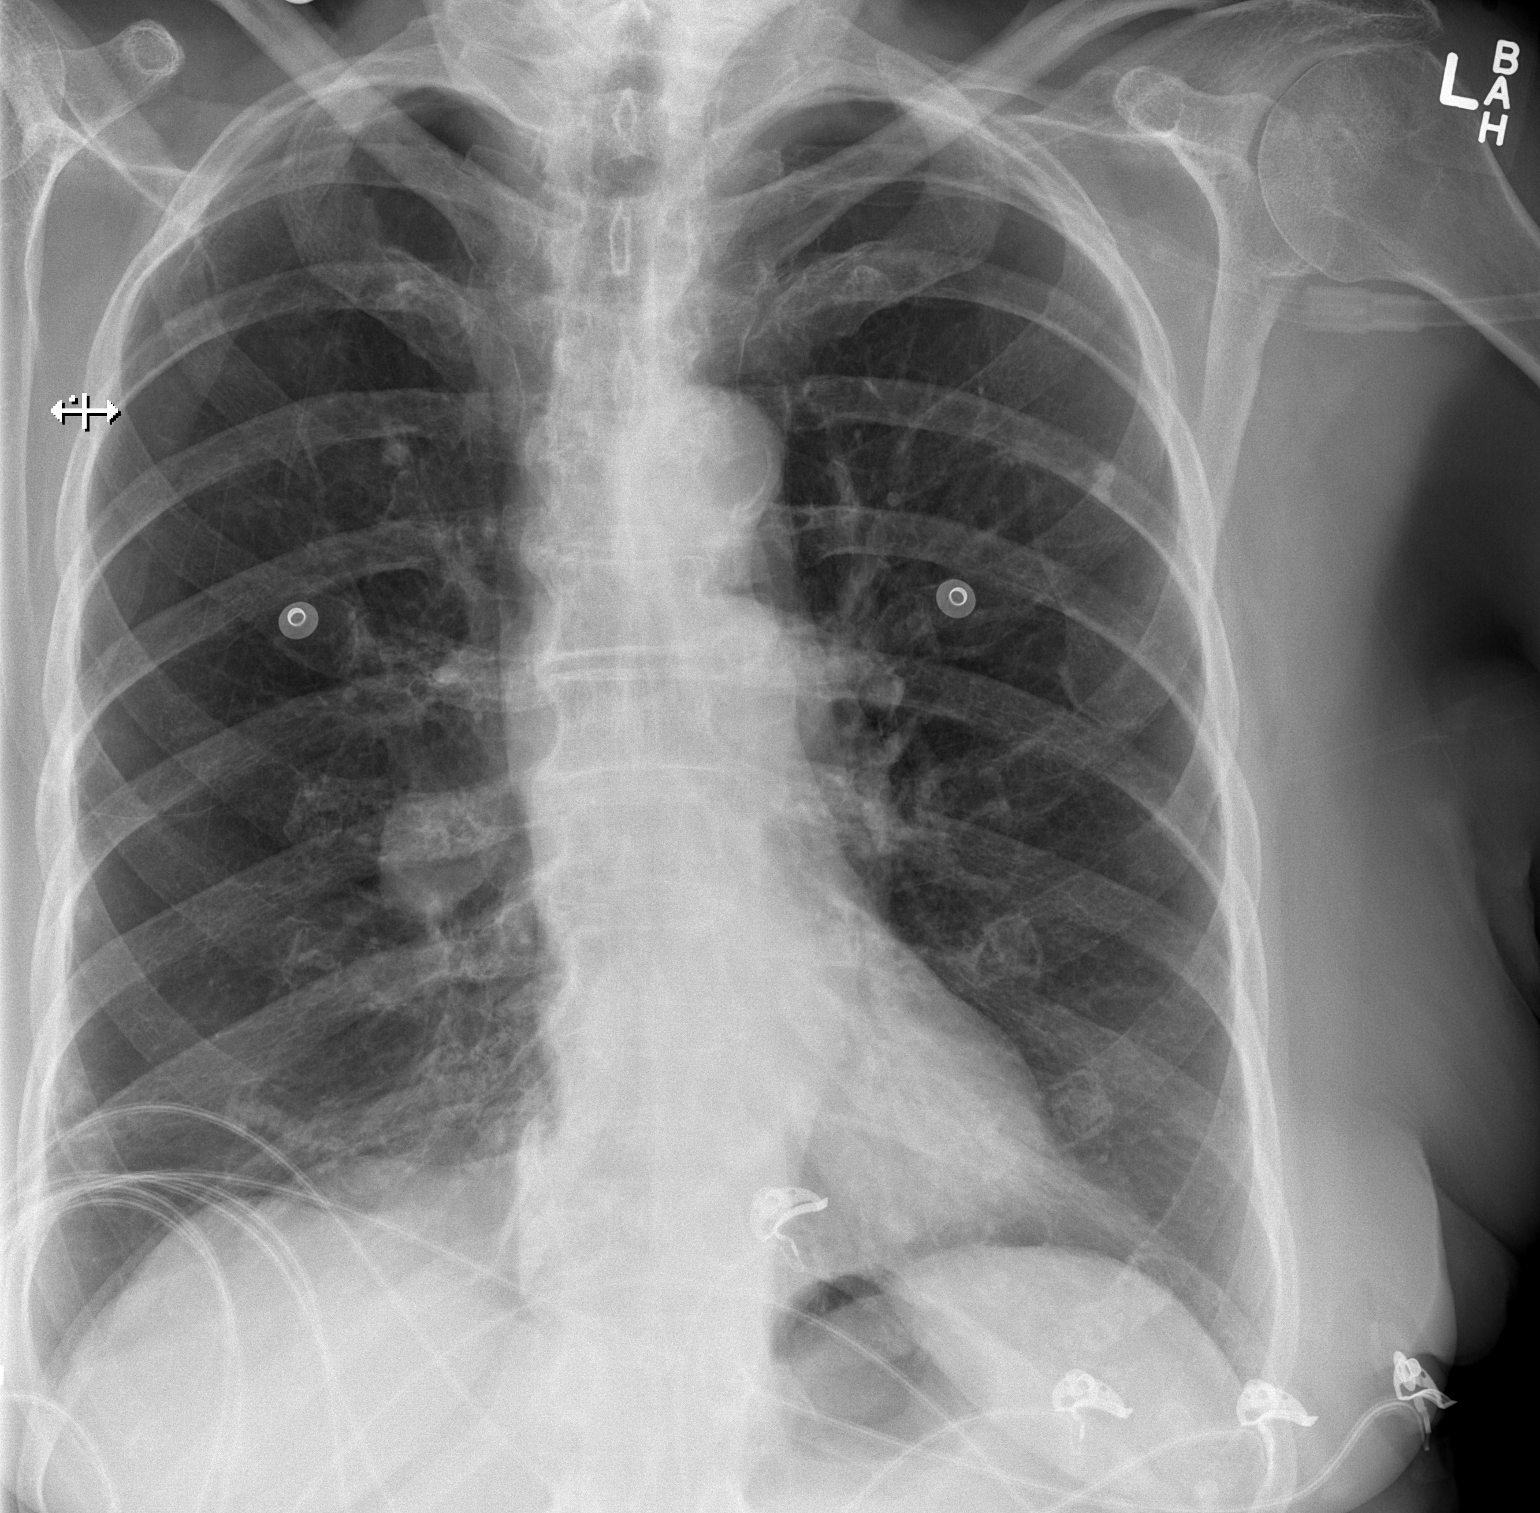

[w chest lat]
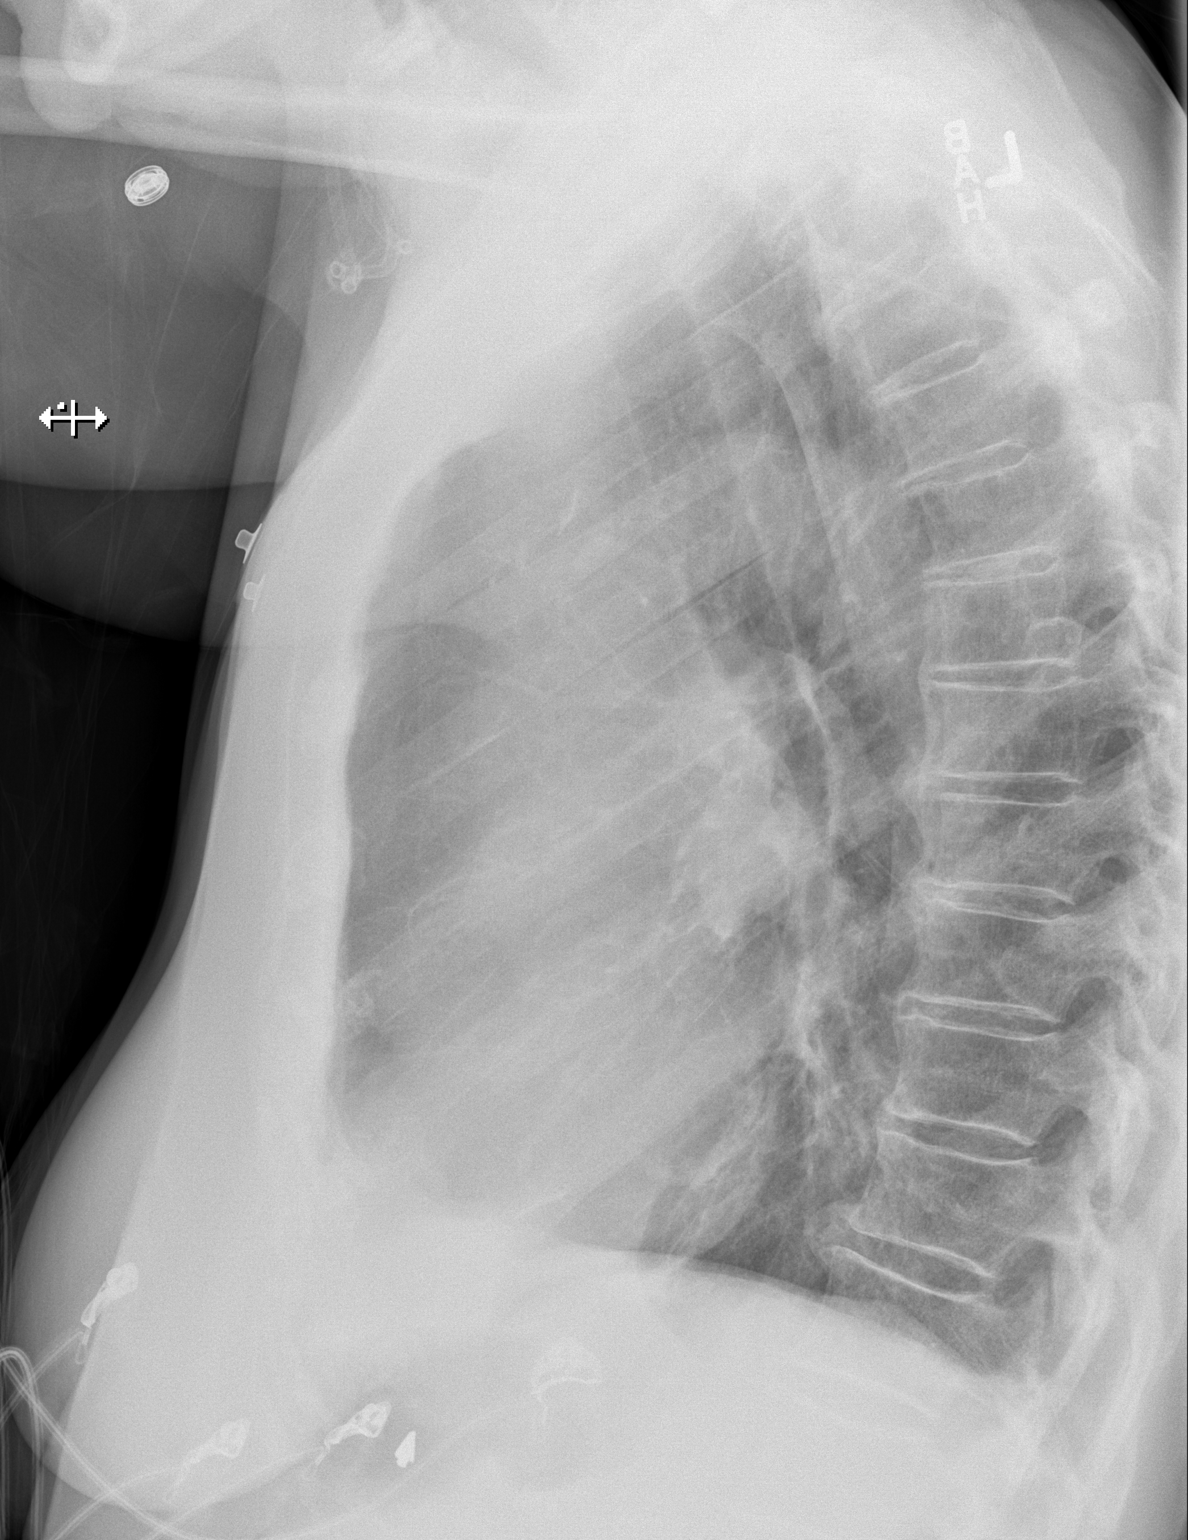

[2 of 2 positions shown; findings below may reference images not displayed]

FINDINGS: Two views of the chest were obtained.  There are
scattered calcifications throughout both sides of the chest which
are unchanged.  There is stable prominence of a right structure.
The right hilar structure is likely vascular in etiology and
similar to an exam in 8220.  Heart size is normal.  No evidence for
a pneumothorax.
IMPRESSION: Stable chest radiograph findings.  No acute disease.

Evidence for old granulomatous disease.

## 2013-03-02 ENCOUNTER — Emergency Department (HOSPITAL_COMMUNITY): Payer: Medicare Other

## 2013-03-02 ENCOUNTER — Encounter (HOSPITAL_COMMUNITY): Payer: Self-pay

## 2013-03-02 ENCOUNTER — Emergency Department (HOSPITAL_COMMUNITY)
Admission: EM | Admit: 2013-03-02 | Discharge: 2013-03-02 | Disposition: A | Discharge status: Home / Self Care | Payer: Medicare Other | Attending: Emergency Medicine | Admitting: Emergency Medicine

## 2013-03-02 DIAGNOSIS — R079 Chest pain, unspecified: Secondary | ICD-10-CM

## 2013-03-02 DIAGNOSIS — I129 Hypertensive chronic kidney disease with stage 1 through stage 4 chronic kidney disease, or unspecified chronic kidney disease: Secondary | ICD-10-CM | POA: Insufficient documentation

## 2013-03-02 DIAGNOSIS — G20A1 Parkinson's disease without dyskinesia, without mention of fluctuations: Secondary | ICD-10-CM | POA: Insufficient documentation

## 2013-03-02 DIAGNOSIS — R0602 Shortness of breath: Secondary | ICD-10-CM | POA: Insufficient documentation

## 2013-03-02 DIAGNOSIS — Z79899 Other long term (current) drug therapy: Secondary | ICD-10-CM | POA: Insufficient documentation

## 2013-03-02 DIAGNOSIS — Z87891 Personal history of nicotine dependence: Secondary | ICD-10-CM | POA: Insufficient documentation

## 2013-03-02 DIAGNOSIS — E785 Hyperlipidemia, unspecified: Secondary | ICD-10-CM | POA: Insufficient documentation

## 2013-03-02 DIAGNOSIS — R1011 Right upper quadrant pain: Secondary | ICD-10-CM | POA: Insufficient documentation

## 2013-03-02 DIAGNOSIS — N184 Chronic kidney disease, stage 4 (severe): Secondary | ICD-10-CM | POA: Insufficient documentation

## 2013-03-02 DIAGNOSIS — Z8679 Personal history of other diseases of the circulatory system: Secondary | ICD-10-CM | POA: Insufficient documentation

## 2013-03-02 DIAGNOSIS — Z86718 Personal history of other venous thrombosis and embolism: Secondary | ICD-10-CM | POA: Insufficient documentation

## 2013-03-02 DIAGNOSIS — Z8739 Personal history of other diseases of the musculoskeletal system and connective tissue: Secondary | ICD-10-CM | POA: Insufficient documentation

## 2013-03-02 DIAGNOSIS — Z8719 Personal history of other diseases of the digestive system: Secondary | ICD-10-CM | POA: Insufficient documentation

## 2013-03-02 DIAGNOSIS — R071 Chest pain on breathing: Secondary | ICD-10-CM | POA: Insufficient documentation

## 2013-03-02 DIAGNOSIS — Z9181 History of falling: Secondary | ICD-10-CM | POA: Insufficient documentation

## 2013-03-02 DIAGNOSIS — G20 Parkinson's disease: Secondary | ICD-10-CM | POA: Insufficient documentation

## 2013-03-02 LAB — COMPREHENSIVE METABOLIC PANEL
AST: 28 U/L (ref 0–37)
CO2: 21 mEq/L (ref 19–32)
Calcium: 9.2 mg/dL (ref 8.4–10.5)
Creatinine, Ser: 1.86 mg/dL — ABNORMAL HIGH (ref 0.50–1.10)
GFR calc Af Amer: 27 mL/min — ABNORMAL LOW (ref >90)
GFR calc non Af Amer: 23 mL/min — ABNORMAL LOW (ref >90)

## 2013-03-02 LAB — CBC WITH DIFFERENTIAL/PLATELET
Basophils Absolute: 0 10*3/uL (ref 0.0–0.1)
Eosinophils Relative: 1 % (ref 0–5)
HCT: 36.8 % (ref 36.0–46.0)
Lymphocytes Relative: 13 % (ref 12–46)
MCH: 28.9 pg (ref 26.0–34.0)
MCHC: 32.9 g/dL (ref 30.0–36.0)
MCV: 87.8 fL (ref 78.0–100.0)
Monocytes Absolute: 0.6 10*3/uL (ref 0.1–1.0)
RDW: 13.5 % (ref 11.5–15.5)

## 2013-03-02 LAB — POCT I-STAT TROPONIN I: Troponin i, poc: 0.01 ng/mL (ref 0.00–0.08)

## 2013-03-02 LAB — TROPONIN I: Troponin I: <0.3 ng/mL (ref <0.30)

## 2013-03-02 MED ORDER — FENTANYL CITRATE 0.05 MG/ML IJ SOLN
50.0000 ug | Freq: Once | INTRAMUSCULAR | Status: AC
  Administered 2013-03-02: 100 ug via INTRAVENOUS
  Filled 2013-03-02: qty 2

## 2013-03-02 MED ORDER — FENTANYL CITRATE 0.05 MG/ML IJ SOLN
50.0000 ug | Freq: Once | INTRAMUSCULAR | Status: AC
  Administered 2013-03-02: 50 ug via INTRAVENOUS
  Filled 2013-03-02: qty 2

## 2013-03-02 MED ORDER — TECHNETIUM TO 99M ALBUMIN AGGREGATED
6.0000 | Freq: Once | INTRAVENOUS | Status: AC | PRN
  Administered 2013-03-02: 6 via INTRAVENOUS

## 2013-03-02 MED ORDER — HYDROCODONE-ACETAMINOPHEN 5-325 MG PO TABS
1.0000 | ORAL_TABLET | Freq: Once | ORAL | Status: AC
  Administered 2013-03-02: 1 via ORAL
  Filled 2013-03-02: qty 1

## 2013-03-02 MED ORDER — TECHNETIUM TC 99M DIETHYLENETRIAME-PENTAACETIC ACID: 40.0000 | Freq: Once | INTRAVENOUS | Status: DC | PRN

## 2013-03-02 MED ORDER — HYDROCODONE-ACETAMINOPHEN 5-325 MG PO TABS: 1.0000 | ORAL_TABLET | Freq: Four times a day (QID) | ORAL | Status: DC | PRN

## 2013-03-02 NOTE — ED Provider Notes (Signed)
CSN: [id number]     Arrival date & time 03/02/13  1239 History     First MD Initiated Contact with Patient 03/02/13 1240     Chief Complaint  Patient presents with  . Chest Pain   (Consider location/radiation/quality/duration/timing/severity/associated sxs/prior Treatment) HPI Comments: Patient is an 79 year old female with a past medical history of hypertension, previous PE, Parkinson's disease, and chronic kidney disease with a 1 day history of right side chest pain. Symptoms started gradually and progressively worsened since the onset. The pain is aching and severe without radiation. The pain is pleuritic. Patient denies any injury. No alleviating factors. Patient reports being SOB at baseline but states she has noticed increased SOB since the onset of chest pain. Patient has a history of previous blood clots.    Past Medical History  Diagnosis Date  . Hypertension   . History of orthostatic hypotension   . IBS (irritable bowel syndrome)   . Parkinson's disease     ATYPICAL  . Dizziness   . Vertigo   . Fatigue   . Advanced age   . HLD (hyperlipidemia)     intolerant to lipid lowering drugs  . Normal nuclear stress test 2003  . Fall at home 05/2012    mechanical fall  (06/01/2012)  . Pulmonary embolism 05/31/2012    off coumadin due to fall/SDH; has filter in place  . Chronic kidney disease, stage 4 (severe)   . Exertional dyspnea   . Arthritis     "legs, fingers" (06/01/2012)  . Subdural hematoma    Past Surgical History  Procedure Laterality Date  . Small intestine surgery    . Hemorrhoid surgery  1970    "have4 had accidents ever since" (06/01/2012)  . Abdominal hysterectomy  1970's  . Tonsillectomy  1954  . Cholecystectomy  1955?  Marland Kitchen Breast cyst excision  1970's    left  . Cataract extraction w/ intraocular lens  implant, bilateral  ? 1990's  . Shoulder open rotator cuff repair  1990?    right   Family History  Problem Relation Age of Onset  . Stroke Father     History  Substance Use Topics  . Smoking status: Former Smoker -- 1.00 packs/day for 20 years    Types: Cigarettes    Quit date: 02/14/1971  . Smokeless tobacco: Never Used  . Alcohol Use: No   OB History   Grav Para Term Preterm Abortions TAB SAB Ect Mult Living                 Review of Systems  Respiratory: Positive for shortness of breath.   Cardiovascular: Positive for chest pain.  All other systems reviewed and are negative.    Allergies  Amlodipine; Codeine; Lipitor; Lisinopril; Red yeast rice; and Zetia  Home Medications   Current Outpatient Rx  Name  Route  Sig  Dispense  Refill  . hydrALAZINE (APRESOLINE) 25 MG tablet   Oral   Take 25 mg by mouth 4 (four) times daily.         . isosorbide mononitrate (IMDUR) 30 MG 24 hr tablet   Oral   Take 30 mg by mouth daily.         . metoprolol succinate (TOPROL-XL) 25 MG 24 hr tablet   Oral   Take 25 mg by mouth 2 (two) times daily. *takes 2nd tablet if blood pressure over 150         . Multiple Vitamin (MULITIVITAMIN WITH MINERALS) TABS  Oral   Take 1 tablet by mouth daily.          BP 196/79  Pulse 70  Temp(Src) 98 F (36.7 C) (Oral)  Resp 23  SpO2 93% Physical Exam  Nursing note and vitals reviewed. Constitutional: She is oriented to person, place, and time. She appears well-developed and well-nourished. No distress.  HENT:  Head: Normocephalic and atraumatic.  Eyes: Conjunctivae are normal.  Neck: Normal range of motion.  Cardiovascular: Normal rate and regular rhythm.  Exam reveals no gallop and no friction rub.   No murmur heard. Pulmonary/Chest: Effort normal and breath sounds normal. She has no wheezes. She has no rales. She exhibits no tenderness.  Abdominal: Soft. She exhibits no distension. There is tenderness. There is no rebound and no guarding.  Mild RUQ tenderness to palpation. No peritoneal signs or focal tenderness to palpation.   Musculoskeletal: Normal range of motion.   Neurological: She is alert and oriented to person, place, and time. Coordination normal.  Speech is goal-oriented. Moves limbs without ataxia.   Skin: Skin is warm and dry.  Psychiatric: She has a normal mood and affect. Her behavior is normal.    ED Course   Procedures (including critical care time)   Date: 03/02/2013  Rate: 71  Rhythm: normal sinus rhythm  QRS Axis: normal  Intervals: PR prolonged  ST/T Wave abnormalities: normal  Conduction Disutrbances: first degree AV block  Narrative Interpretation: NSR with first degree AV block unchanged from previous  Old EKG Reviewed: unchanged    Labs Reviewed  CBC WITH DIFFERENTIAL - Abnormal; Notable for the following:    Neutrophils Relative % 79 (*)    Neutro Abs 7.8 (*)    All other components within normal limits  COMPREHENSIVE METABOLIC PANEL - Abnormal; Notable for the following:    BUN 37 (*)    Creatinine, Ser 1.86 (*)    GFR calc non Af Amer 23 (*)    GFR calc Af Amer 27 (*)    All other components within normal limits  LIPASE, BLOOD  TROPONIN I  POCT I-STAT TROPONIN I   Nm Pulmonary Perf And Vent  03/02/2013   *RADIOLOGY REPORT*  Clinical Data:  Shortness of breath, history of pulmonary embolism, now with right-sided chest pain, evaluate for pulmonary embolism  NUCLEAR MEDICINE VENTILATION - PERFUSION LUNG SCAN  Technique: The ventilatory images were obtained with the inhalation of aerosolized technetium 33m DTPA.  Perfusion images were obtained in multiple projections after intravenous injection of Tc-31m MAA.  Radiopharmaceuticals:  40 mCi aerosolized technetium 4m DTPA and 6 mCi Tc-55m MAA.  Comparison:  Chest radiograph - earlier same day; VQ scan - 06/01/2012; IVC filter placement - 06/20/2012  Findings:  Chest radiograph:  Review of chest radiograph performed earlier same day demonstrates long hyperexpansion with minimal bibasilar opacities, left greater than right, likely atelectasis.  No definite pleural  effusion or pneumothorax.  Ventilatory images:  There is mottled aeration of te bilateral lungs, right greater than left, with decreased aeration of the peripheral aspect of the majority of the right lung as well as decreased aeration of the left lung apex and peripheral aspect the left mid lung.  There is clumping of inhaled radiotracer about the bilateral pulmonary hila, left greater than right.  There is a minimal amount of ingested radiotracer within the hypopharynx, superior aspect of the esophagus and stomach.  Perfusion images: Mottled perfusion of the bilateral lungs without discrete mismatched segmental or subsegmental perfusion defect to suggest  pulmonary embolism.  IMPRESSION: 1.  Pulmonary embolism absent (low probability for pulmonary embolism) 2.  Mottled aeration of the bilateral lungs, right greater than left, suggestive of air trapping/airways disease.   Original Report Authenticated By: Tacey Ruiz, MD   1. Chest pain     MDM  2:40 PM Labs unremarkable. Patient will have VQ scan to rule out PE. Chest xray unremarkable for acute changes.   3:16 PM Patient signed out to Dr. Rhunette Croft.    Emilia Beck, PA-C 03/04/13 1623

## 2013-03-02 NOTE — ED Provider Notes (Signed)
  Physical Exam  BP 134/95  Pulse 66  Temp(Src) 98 F (36.7 C) (Oral)  Resp 16  SpO2 97%  Physical Exam  ED Course  Procedures  MDM Assuming care for the patient. Plan according the primary team to get trops x 2 and v/q scan - which if negative, pt to be discharged. V/q scan is already neg for PE. Trop x 2 pending at this time.  Derwood Kaplan, MD 03/02/13 2011

## 2013-03-02 NOTE — ED Provider Notes (Signed)
Medical screening examination/treatment/procedure(s) were conducted as a shared visit with non-physician practitioner(s) and myself.  I personally evaluated the patient during the encounter`  79 yo female with right sided pleuritic chest pain and shortness of breath.  No distress on my exam with clear lung sounds.  Chest wall tender to palpation.  Likely musculoskeletal chest pain, but she states history of PE.  Plan V/Q scan as she has renal insufficiency.  Have very low suspicion for ACS based on history and exam, but given age, plan to check delta troponin.   Candyce Churn, MD 03/03/13 [phone]

## 2013-03-02 NOTE — ED Notes (Signed)
Rt. Side chest pain lateral side. Pain increases with movement and deep breath.  No dizziness, nausea Skin is p/w/d. Pt. Is on Home O2 PRN 2 Liters. Alert and oriented X4.  Hx of PEs

## 2013-03-04 ENCOUNTER — Emergency Department (HOSPITAL_COMMUNITY): Payer: Medicare Other

## 2013-03-04 ENCOUNTER — Encounter (HOSPITAL_COMMUNITY): Payer: Self-pay

## 2013-03-04 ENCOUNTER — Telehealth: Payer: Self-pay | Admitting: Cardiology

## 2013-03-04 ENCOUNTER — Emergency Department (HOSPITAL_COMMUNITY)
Admission: EM | Admit: 2013-03-04 | Discharge: 2013-03-05 | Disposition: A | Discharge status: Home / Self Care | Payer: Medicare Other | Attending: Emergency Medicine | Admitting: Emergency Medicine

## 2013-03-04 DIAGNOSIS — I129 Hypertensive chronic kidney disease with stage 1 through stage 4 chronic kidney disease, or unspecified chronic kidney disease: Secondary | ICD-10-CM | POA: Insufficient documentation

## 2013-03-04 DIAGNOSIS — Z8719 Personal history of other diseases of the digestive system: Secondary | ICD-10-CM | POA: Insufficient documentation

## 2013-03-04 DIAGNOSIS — R5381 Other malaise: Secondary | ICD-10-CM | POA: Insufficient documentation

## 2013-03-04 DIAGNOSIS — G20A1 Parkinson's disease without dyskinesia, without mention of fluctuations: Secondary | ICD-10-CM | POA: Insufficient documentation

## 2013-03-04 DIAGNOSIS — Z862 Personal history of diseases of the blood and blood-forming organs and certain disorders involving the immune mechanism: Secondary | ICD-10-CM | POA: Insufficient documentation

## 2013-03-04 DIAGNOSIS — Z8739 Personal history of other diseases of the musculoskeletal system and connective tissue: Secondary | ICD-10-CM | POA: Insufficient documentation

## 2013-03-04 DIAGNOSIS — F4321 Adjustment disorder with depressed mood: Secondary | ICD-10-CM | POA: Insufficient documentation

## 2013-03-04 DIAGNOSIS — Z87891 Personal history of nicotine dependence: Secondary | ICD-10-CM | POA: Insufficient documentation

## 2013-03-04 DIAGNOSIS — Z79899 Other long term (current) drug therapy: Secondary | ICD-10-CM | POA: Insufficient documentation

## 2013-03-04 DIAGNOSIS — G20 Parkinson's disease: Secondary | ICD-10-CM | POA: Insufficient documentation

## 2013-03-04 DIAGNOSIS — R531 Weakness: Secondary | ICD-10-CM

## 2013-03-04 DIAGNOSIS — Z86711 Personal history of pulmonary embolism: Secondary | ICD-10-CM | POA: Insufficient documentation

## 2013-03-04 DIAGNOSIS — R079 Chest pain, unspecified: Secondary | ICD-10-CM | POA: Insufficient documentation

## 2013-03-04 DIAGNOSIS — N184 Chronic kidney disease, stage 4 (severe): Secondary | ICD-10-CM | POA: Insufficient documentation

## 2013-03-04 DIAGNOSIS — Z8639 Personal history of other endocrine, nutritional and metabolic disease: Secondary | ICD-10-CM | POA: Insufficient documentation

## 2013-03-04 LAB — CBC WITH DIFFERENTIAL/PLATELET
Basophils Relative: 0 % (ref 0–1)
Eosinophils Absolute: 0.2 10*3/uL (ref 0.0–0.7)
HCT: 36.6 % (ref 36.0–46.0)
Hemoglobin: 12 g/dL (ref 12.0–15.0)
Lymphs Abs: 1.7 10*3/uL (ref 0.7–4.0)
MCH: 29 pg (ref 26.0–34.0)
MCHC: 32.8 g/dL (ref 30.0–36.0)
MCV: 88.4 fL (ref 78.0–100.0)
Monocytes Absolute: 0.7 10*3/uL (ref 0.1–1.0)
Monocytes Relative: 9 % (ref 3–12)
RBC: 4.14 MIL/uL (ref 3.87–5.11)

## 2013-03-04 LAB — URINALYSIS W MICROSCOPIC + REFLEX CULTURE
Bilirubin Urine: NEGATIVE
Hgb urine dipstick: NEGATIVE
Ketones, ur: NEGATIVE mg/dL
Nitrite: NEGATIVE
Protein, ur: NEGATIVE mg/dL
Specific Gravity, Urine: 1.014 (ref 1.005–1.030)
Urobilinogen, UA: 0.2 mg/dL (ref 0.0–1.0)

## 2013-03-04 LAB — COMPREHENSIVE METABOLIC PANEL
Albumin: 3.3 g/dL — ABNORMAL LOW (ref 3.5–5.2)
Alkaline Phosphatase: 66 U/L (ref 39–117)
BUN: 37 mg/dL — ABNORMAL HIGH (ref 6–23)
Creatinine, Ser: 1.98 mg/dL — ABNORMAL HIGH (ref 0.50–1.10)
GFR calc Af Amer: 25 mL/min — ABNORMAL LOW (ref >90)
Glucose, Bld: 104 mg/dL — ABNORMAL HIGH (ref 70–99)
Potassium: 4.2 mEq/L (ref 3.5–5.1)
Total Bilirubin: 0.2 mg/dL — ABNORMAL LOW (ref 0.3–1.2)
Total Protein: 6.5 g/dL (ref 6.0–8.3)

## 2013-03-04 LAB — LACTIC ACID, PLASMA: Lactic Acid, Venous: 0.8 mmol/L (ref 0.5–2.2)

## 2013-03-04 LAB — LIPASE, BLOOD: Lipase: 24 U/L (ref 11–59)

## 2013-03-04 MED ORDER — SODIUM CHLORIDE 0.9 % IV SOLN
INTRAVENOUS | Status: DC
  Administered 2013-03-04: 21:00:00 via INTRAVENOUS

## 2013-03-04 NOTE — Telephone Encounter (Signed)
Patient rescheduled for 7/30 @ 0845 per Dr. Patty Sermons.  Patient's grandson, Kipp Brood is aware and thanked me for my help.

## 2013-03-04 NOTE — ED Notes (Signed)
Upon standing pt to ambulate with two assist, pt was unsteady and unable to walk. Pulse ox was 95% while sitting and upon standing pulse ox went down to 90%.

## 2013-03-04 NOTE — ED Notes (Signed)
Tele-psych paperwork faxed

## 2013-03-04 NOTE — ED Notes (Signed)
Pt presents via EMS with c/o weakness and depression. Pt was seen at [facility] on Saturday for chest wall pain and has not been feeling good since then. Pt's husband died 60 days and per EMS pt is severely depressed and having a hard time dealing with the loss. Per EMS, pt also feels febrile.

## 2013-03-04 NOTE — ED Notes (Signed)
Telepsych specialist on call contact and consult initiated

## 2013-03-04 NOTE — Telephone Encounter (Signed)
Spoke with patient's grandson who states patient was seen in the ED on Saturday and was told to f/u with Dr. Patty Sermons in 3 days.  Patient was scheduled for Monday 8/4 but family is concerned that this is too far out.  I advised that I would talk with Dr. Patty Sermons as his schedule for this week is full.  Patient's grandson, Kipp Brood verbalized understanding.

## 2013-03-04 NOTE — ED Notes (Signed)
Pt's husband died in May and per pt she "wants to go home to her husband". Pt is very tearful during triage.

## 2013-03-04 NOTE — ED Notes (Signed)
[id number]<AV> Expected date:03/04/13<BR> Expected time: 7:44 PM<BR> Means of arrival:Ambulance<BR> Comments:<BR> Htn, depression, fever

## 2013-03-04 NOTE — ED Provider Notes (Signed)
CSN: [id number]     Arrival date & time 03/04/13  1956 History     First MD Initiated Contact with Patient 03/04/13 2016     Chief Complaint  Patient presents with  . Weakness  . Depression    HPI Pt was seen at 2020.  Per pt and her family, c/o gradual onset and worsening of persistent generalized weakness/fatigue for the past 2 months. Pt states her symptoms began after her husband died suddenly 2 months ago. Pt states she has been tearful, and not sleeping or eating well. States she "just feels weak."  Pt also c/o gradual onset and persistence of constant right sided chest wall "pain" that began 2 days ago. Pt was evaluated in the ED for same and discharged 2 days ago. Pt states the discomfort continues unchanged. Denies palpitations, SOB/cough, no abd pain, no N/V/D, no back pain, no fevers, no rash, no SI.     Past Medical History  Diagnosis Date  . Hypertension   . History of orthostatic hypotension   . IBS (irritable bowel syndrome)   . Parkinson's disease     ATYPICAL  . Dizziness   . Vertigo   . Fatigue   . Advanced age   . HLD (hyperlipidemia)     intolerant to lipid lowering drugs  . Normal nuclear stress test 2003  . Fall at home 05/2012    mechanical fall  (06/01/2012)  . Pulmonary embolism 05/31/2012    off coumadin due to fall/SDH; has filter in place  . Chronic kidney disease, stage 4 (severe)   . Exertional dyspnea   . Arthritis     "legs, fingers" (06/01/2012)  . Subdural hematoma    Past Surgical History  Procedure Laterality Date  . Small intestine surgery    . Hemorrhoid surgery  1970    "have4 had accidents ever since" (06/01/2012)  . Abdominal hysterectomy  1970's  . Tonsillectomy  1954  . Cholecystectomy  1955?  Marland Kitchen Breast cyst excision  1970's    left  . Cataract extraction w/ intraocular lens  implant, bilateral  ? 1990's  . Shoulder open rotator cuff repair  1990?    right   Family History  Problem Relation Age of Onset  . Stroke Father     History  Substance Use Topics  . Smoking status: Former Smoker -- 1.00 packs/day for 20 years    Types: Cigarettes    Quit date: 02/14/1971  . Smokeless tobacco: Never Used  . Alcohol Use: No    Review of Systems ROS: Statement: All systems negative except as marked or noted in the HPI; Constitutional: Negative for fever and chills. +generalized weakness/fatigue, decreased appetite, decreased sleeping. ; ; Eyes: Negative for eye pain, redness and discharge. ; ; ENMT: Negative for ear pain, hoarseness, nasal congestion, sinus pressure and sore throat. ; ; Cardiovascular: Negative for chest pain, palpitations, diaphoresis, dyspnea and peripheral edema. ; ; Respiratory: Negative for cough, wheezing and stridor. ; ; Gastrointestinal: Negative for nausea, vomiting, diarrhea, abdominal pain, blood in stool, hematemesis, jaundice and rectal bleeding. . ; ; Genitourinary: Negative for dysuria, flank pain and hematuria. ; ; Musculoskeletal: Negative for back pain and neck pain. Negative for swelling and trauma.; ; Skin: Negative for pruritus, rash, abrasions, blisters, bruising and skin lesion.; ; Neuro: Negative for headache, lightheadedness and neck stiffness. Negative for altered level of consciousness , altered mental status, extremity weakness, paresthesias, involuntary movement, seizure and syncope.; Psych:  +depression. No SI, no SA, no  HI, no hallucinations.       Allergies  Amlodipine; Codeine; Lipitor; Lisinopril; Red yeast rice; and Zetia  Home Medications   Current Outpatient Rx  Name  Route  Sig  Dispense  Refill  . hydrALAZINE (APRESOLINE) 25 MG tablet   Oral   Take 25 mg by mouth 3 (three) times daily.          Marland Kitchen HYDROcodone-acetaminophen (NORCO/VICODIN) 5-325 MG per tablet   Oral   Take 1 tablet by mouth every 6 (six) hours as needed for pain.   10 tablet   0   . isosorbide mononitrate (IMDUR) 30 MG 24 hr tablet   Oral   Take 30 mg by mouth daily.         .  metoprolol succinate (TOPROL-XL) 25 MG 24 hr tablet   Oral   Take 25 mg by mouth 2 (two) times daily. *takes 2nd tablet if blood pressure over 150         . Multiple Vitamin (MULITIVITAMIN WITH MINERALS) TABS   Oral   Take 1 tablet by mouth daily.          BP 160/78  Pulse 86  Temp(Src) 98.1 F (36.7 C) (Oral)  Resp 24  SpO2 99% Physical Exam 2005: Physical examination:  Nursing notes reviewed; Vital signs and O2 SAT reviewed;  Constitutional: Well developed, Well nourished, Well hydrated, +tearful; Head:  Normocephalic, atraumatic; Eyes: EOMI, PERRL, No scleral icterus; ENMT: Mouth and pharynx normal, Mucous membranes moist; Neck: Supple, Full range of motion, No lymphadenopathy; Cardiovascular: Regular rate and rhythm, No gallop; Respiratory: Breath sounds clear & equal bilaterally, No rales, rhonchi, wheezes.  Speaking full sentences with ease, Normal respiratory effort/excursion; Chest: Nontender, Movement normal; Abdomen: Soft, Nontender, Nondistended, Normal bowel sounds; Genitourinary: No CVA tenderness; Extremities: Pulses normal, No tenderness, No edema, No calf edema or asymmetry.; Neuro: AA&Ox3, Major CN grossly intact.  Speech clear. No gross focal motor or sensory deficits in extremities.; Skin: Color normal, Warm, Dry.; Psych:  +tearful.    ED Course   Procedures     MDM  MDM Reviewed: previous chart, nursing note and vitals Reviewed previous: labs, ECG and x-ray (V/Q scan) Interpretation: labs, ECG and x-ray    Date: 03/04/2013  Rate: 64  Rhythm: normal sinus rhythm  QRS Axis: normal  Intervals: PR prolonged  ST/T Wave abnormalities: normal  Conduction Disutrbances:first-degree A-V block   Narrative Interpretation:   Old EKG Reviewed: unchanged; no significant changes from previous EKG dated 11/03/2012.   Results for orders placed during the hospital encounter of 03/04/13  URINALYSIS W MICROSCOPIC + REFLEX CULTURE      Result Value Range   Color,  Urine YELLOW  YELLOW   APPearance CLEAR  CLEAR   Specific Gravity, Urine 1.014  1.005 - 1.030   pH 5.5  5.0 - 8.0   Glucose, UA NEGATIVE  NEGATIVE mg/dL   Hgb urine dipstick NEGATIVE  NEGATIVE   Bilirubin Urine NEGATIVE  NEGATIVE   Ketones, ur NEGATIVE  NEGATIVE mg/dL   Protein, ur NEGATIVE  NEGATIVE mg/dL   Urobilinogen, UA 0.2  0.0 - 1.0 mg/dL   Nitrite NEGATIVE  NEGATIVE   Leukocytes, UA MODERATE (*) NEGATIVE   WBC, UA 3-6  <3 WBC/hpf   Bacteria, UA RARE  RARE   Squamous Epithelial / LPF RARE  RARE  CBC WITH DIFFERENTIAL      Result Value Range   WBC 8.0  4.0 - 10.5 K/uL   RBC 4.14  3.87 - 5.11 MIL/uL   Hemoglobin 12.0  12.0 - 15.0 g/dL   HCT 16.1  09.6 - 04.5 %   MCV 88.4  78.0 - 100.0 fL   MCH 29.0  26.0 - 34.0 pg   MCHC 32.8  30.0 - 36.0 g/dL   RDW 40.9  81.1 - 91.4 %   Platelets 332  150 - 400 K/uL   Neutrophils Relative % 66  43 - 77 %   Neutro Abs 5.3  1.7 - 7.7 K/uL   Lymphocytes Relative 22  12 - 46 %   Lymphs Abs 1.7  0.7 - 4.0 K/uL   Monocytes Relative 9  3 - 12 %   Monocytes Absolute 0.7  0.1 - 1.0 K/uL   Eosinophils Relative 3  0 - 5 %   Eosinophils Absolute 0.2  0.0 - 0.7 K/uL   Basophils Relative 0  0 - 1 %   Basophils Absolute 0.0  0.0 - 0.1 K/uL  COMPREHENSIVE METABOLIC PANEL      Result Value Range   Sodium 137  135 - 145 mEq/L   Potassium 4.2  3.5 - 5.1 mEq/L   Chloride 106  96 - 112 mEq/L   CO2 18 (*) 19 - 32 mEq/L   Glucose, Bld 104 (*) 70 - 99 mg/dL   BUN 37 (*) 6 - 23 mg/dL   Creatinine, Ser 7.82 (*) 0.50 - 1.10 mg/dL   Calcium 8.9  8.4 - 95.6 mg/dL   Total Protein 6.5  6.0 - 8.3 g/dL   Albumin 3.3 (*) 3.5 - 5.2 g/dL   AST 24  0 - 37 U/L   ALT 14  0 - 35 U/L   Alkaline Phosphatase 66  39 - 117 U/L   Total Bilirubin 0.2 (*) 0.3 - 1.2 mg/dL   GFR calc non Af Amer 22 (*) >90 mL/min   GFR calc Af Amer 25 (*) >90 mL/min  LIPASE, BLOOD      Result Value Range   Lipase 24  11 - 59 U/L  LACTIC ACID, PLASMA      Result Value Range   Lactic  Acid, Venous 0.8  0.5 - 2.2 mmol/L  TROPONIN I      Result Value Range   Troponin I <0.30  <0.30 ng/mL   Dg Chest 2 View 03/04/2013   *RADIOLOGY REPORT*  Clinical Data: Weakness and shortness of breath  CHEST - 2 VIEW  Comparison: March 02, 2013  Findings: There is underlying emphysematous change.  There is no edema or consolidation.  The heart size is normal.  Pulmonary vascularity reflects underlying emphysema.  No adenopathy.  IMPRESSION: Underlying emphysematous change.  No edema or consolidation.   Original Report Authenticated By: Bretta Bang, M.D.   Nm Pulmonary Perf And Vent 03/02/2013   *RADIOLOGY REPORT*  Clinical Data:  Shortness of breath, history of pulmonary embolism, now with right-sided chest pain, evaluate for pulmonary embolism  NUCLEAR MEDICINE VENTILATION - PERFUSION LUNG SCAN  Technique: The ventilatory images were obtained with the inhalation of aerosolized technetium 33m DTPA.  Perfusion images were obtained in multiple projections after intravenous injection of Tc-27m MAA.  Radiopharmaceuticals:  40 mCi aerosolized technetium 81m DTPA and 6 mCi Tc-35m MAA.  Comparison:  Chest radiograph - earlier same day; VQ scan - 06/01/2012; IVC filter placement - 06/20/2012  Findings:  Chest radiograph:  Review of chest radiograph performed earlier same day demonstrates long hyperexpansion with minimal bibasilar opacities, left greater than right, likely atelectasis.  No definite pleural effusion or pneumothorax.  Ventilatory images:  There is mottled aeration of te bilateral lungs, right greater than left, with decreased aeration of the peripheral aspect of the majority of the right lung as well as decreased aeration of the left lung apex and peripheral aspect the left mid lung.  There is clumping of inhaled radiotracer about the bilateral pulmonary hila, left greater than right.  There is a minimal amount of ingested radiotracer within the hypopharynx, superior aspect of the esophagus and  stomach.  Perfusion images: Mottled perfusion of the bilateral lungs without discrete mismatched segmental or subsegmental perfusion defect to suggest pulmonary embolism.  IMPRESSION: 1.  Pulmonary embolism absent (low probability for pulmonary embolism) 2.  Mottled aeration of the bilateral lungs, right greater than left, suggestive of air trapping/airways disease.   Original Report Authenticated By: Tacey Ruiz, MD   Results for ALMADELIA, LOOMAN (MRN [id number]) as of 03/05/2013 00:09  Ref. Range 11/03/2012 15:09 03/02/2013 13:33 03/04/2013 20:55  BUN Latest Range: 6-23 mg/dL 33 (H) 37 (H) 37 (H)  Creatinine Latest Range: 0.50-1.10 mg/dL 4.09 (H) 8.11 (H) 9.14 (H)    2345: Pt not orthostatic. Continues tearful, stating she is "just too weak" to stand, walk or eat.  Family concerned regarding pt's continued strong grief reaction s/p her husband's death approx 2 months ago. ED RN offered pt food, which she accepted with family's encouragement. Will have Telepsych MD eval.   0010:  Telepsych MD Penalver has evaluated pt: states she is not a danger to herself or others and can be d/c to home with outpt/community resources; dx adjustment disorder with depressed mood. Pt has eaten a meal, had 1L NS IVF. Pt has now ambulated with ED staff (pt usually walks with a walker) with steady gait, easy resps, and per her baseline per multiple family members observing her. Pt wants to go home now. Dx and testing d/w pt and family.  Questions answered.  Verb understanding, agreeable to d/c home with outpt f/u.   Laray Anger, DO 03/07/13 1552

## 2013-03-04 NOTE — Telephone Encounter (Signed)
New Prob     Pts grandson would like to speak to nurse regarding getting pt in sooner. Please call.

## 2013-03-05 NOTE — ED Notes (Signed)
Pt able to ambulate with 2 person assist. Steady gait

## 2013-03-06 ENCOUNTER — Ambulatory Visit (INDEPENDENT_AMBULATORY_CARE_PROVIDER_SITE_OTHER): Payer: Medicare Other | Admitting: Cardiology

## 2013-03-06 ENCOUNTER — Encounter: Payer: Self-pay | Admitting: Cardiology

## 2013-03-06 VITALS — BP 144/78 | HR 64 | Ht 67.0 in | Wt 148.8 lb

## 2013-03-06 DIAGNOSIS — I2699 Other pulmonary embolism without acute cor pulmonale: Secondary | ICD-10-CM

## 2013-03-06 DIAGNOSIS — F329 Major depressive disorder, single episode, unspecified: Secondary | ICD-10-CM | POA: Insufficient documentation

## 2013-03-06 DIAGNOSIS — I119 Hypertensive heart disease without heart failure: Secondary | ICD-10-CM

## 2013-03-06 MED ORDER — SERTRALINE HCL 25 MG PO TABS: 25.0000 mg | ORAL_TABLET | Freq: Every day | ORAL | Status: DC

## 2013-03-06 NOTE — Assessment & Plan Note (Signed)
The patient takes hydralazine 25 mg 4 times a day.  Her blood pressure was remaining good on this dose.

## 2013-03-06 NOTE — ED Provider Notes (Signed)
Medical screening examination/treatment/procedure(s) were conducted as a shared visit with non-physician practitioner(s) and myself.  I personally evaluated the patient during the encounter.   Please see my separate note.    Candyce Churn, MD 03/06/13 1036

## 2013-03-06 NOTE — Assessment & Plan Note (Signed)
The patient has been having symptoms of depression since her husband died.  I think she is also concerned because her grandson who is very attentive to her is moving to [place] in 2 weeks.  We will give her a trial of generic Zoloft 25 mg one daily for depression

## 2013-03-06 NOTE — Assessment & Plan Note (Signed)
The patient has a remote history of pulmonary embolism but no recent history and her most recent CT angiogram was normal.  The patient is no longer on warfarin.  She had a bad fall in November 2013 while in rehabilitation.

## 2013-03-06 NOTE — Progress Notes (Signed)
Nicole Harding Date of Birth:  1924/02/26 [facility] [address] [place], [place]  [address] [phone]         Fax   [phone]  History of Present Illness:  This pleasant 79 year old woman is seen for a post emergency room followup office visit. She has a past history of high blood pressure associated with lightheadedness and dizziness. She has a history of irritable bowel syndrome and history of chronic renal insufficiency. She does not have any history of ischemic heart disease and she had a normal nuclear stress test in 2003 by Dr. Jenne Campus. She has a history of a remote echocardiogram by Dr. Guadlupe Spanish which was unremarkable. Since her last saw her she was placed on hydralazine for poorly controlled blood pressure. She is on 25 mg 4 times a day. The patient has a history of postmenopausal state with a bothersome night sweats and hot flashes. She has had a hysterectomy and she gets regular mammograms. She has a past history of subarachnoid hemorrhage and is making a good recovery.  Over the past several months her husband died and she has been having symptoms of depression.  She had emergency room visits to [facility] on 03/02/13 and 2 days later on 03/04/13 she went to [facility] because she just wasn't feeling well.  On the first emergency room visit she did have a CT angiogram of the chest which did not show any pulmonary emboli.  No evidence of a urinary tract infection was found.  She was not placed on any medication and she was told to followup with [place].   Current Outpatient Prescriptions  Medication Sig Dispense Refill  . hydrALAZINE (APRESOLINE) 25 MG tablet Take 25 mg by mouth 4 (four) times daily.       Marland Kitchen HYDROcodone-acetaminophen (NORCO/VICODIN) 5-325 MG per tablet Take 1 tablet by mouth every 6 (six) hours as needed for pain.  10 tablet  0  . isosorbide mononitrate (IMDUR) 30 MG 24 hr tablet Take 30 mg by mouth daily.      . metoprolol  succinate (TOPROL-XL) 25 MG 24 hr tablet Take 25 mg by mouth 2 (two) times daily. *takes 2nd tablet if blood pressure over 150      . Multiple Vitamin (MULITIVITAMIN WITH MINERALS) TABS Take 1 tablet by mouth daily.      . sertraline (ZOLOFT) 25 MG tablet Take 1 tablet (25 mg total) by mouth daily.  30 tablet  5   No current facility-administered medications for this visit.    Allergies  Allergen Reactions  . Amlodipine Nausea And Vomiting and Swelling    Swelling --sick and ended up in hospital N&V  . Codeine Nausea And Vomiting  . Lipitor (Atorvastatin Calcium) Other (See Comments)    "cramps my legs"  . Lisinopril Other (See Comments)    unknown  . Red Yeast Rice Other (See Comments)    unknown  . Zetia (Ezetimibe) Other (See Comments)    unknown    Patient Active Problem List   Diagnosis Date Noted  . Depression 03/06/2013  . History of pulmonary embolism 06/19/2012  . Warfarin anticoagulation 06/19/2012  . UTI (lower urinary tract infection) due to pseudomonas 06/19/2012  . SDH (subdural hematoma) 06/18/2012  . Metabolic encephalopathy 06/18/2012  . Sepsis due to Pseudomonas 06/18/2012  . Cervical myelopathy 06/07/2012  . Rigors 06/05/2012  . Pulmonary embolism 06/01/2012  . UTI (urinary tract infection) 06/01/2012  . Pain in left shoulder  06/01/2012  .  Nausea 06/01/2012  . CKD (chronic kidney disease) stage 4, GFR 15-29 ml/min 06/01/2012  . HTN (hypertension) 06/01/2012  . Accelerated hypertension 06/01/2012  . Fall-recurrent 06/01/2012  . Benign hypertensive heart disease without heart failure 12/13/2011  . Dizziness - light-headed 12/13/2011  . Chronic renal insufficiency, stage III (moderate) 12/13/2011  . Pure hypercholesterolemia 09/26/2011  . Vertigo   . History of orthostatic hypotension 02/21/2011  . Irritable bowel syndrome 02/21/2011  . Postmenopausal state 02/21/2011    History  Smoking status  . Former Smoker -- 1.00 packs/day for 20 years  .  Types: Cigarettes  . Quit date: 02/14/1971  Smokeless tobacco  . Never Used    History  Alcohol Use No    Family History  Problem Relation Age of Onset  . Stroke Father     Review of Systems: Constitutional: no fever chills diaphoresis or fatigue or change in weight.  Head and neck: no hearing loss, no epistaxis, no photophobia or visual disturbance. Respiratory: No cough, shortness of breath or wheezing. Cardiovascular: No chest pain peripheral edema, palpitations. Gastrointestinal: No abdominal distention, no abdominal pain, no change in bowel habits hematochezia or melena. Genitourinary: No dysuria, no frequency, no urgency, no nocturia. Musculoskeletal:No arthralgias, no back pain, no gait disturbance or myalgias. Neurological: No dizziness, no headaches, no numbness, no seizures, no syncope, no weakness, no tremors. Hematologic: No lymphadenopathy, no easy bruising. Psychiatric: No confusion, no hallucinations, no sleep disturbance.    Physical Exam: Filed Vitals:   03/06/13 0843  BP: 144/78  Pulse: 64   general appearance reveals a well-developed well-nourished elderly woman who appears to be moderately depressed.The head and neck exam reveals pupils equal and reactive.  Extraocular movements are full.  There is no scleral icterus.  The mouth and pharynx are normal.  The neck is supple.  The carotids reveal no bruits.  The jugular venous pressure is normal.  The  thyroid is not enlarged.  There is no lymphadenopathy.  The chest is clear to percussion and auscultation.  There are no rales or rhonchi.  Expansion of the chest is symmetrical.  The precordium is quiet.  The first heart sound is normal.  The second heart sound is physiologically split.  There is no murmur gallop rub or click.  There is no abnormal lift or heave.  The abdomen is soft and nontender.  The bowel sounds are normal.  The liver and spleen are not enlarged.  There are no abdominal masses.  There are no  abdominal bruits.  Extremities reveal good pedal pulses.  There is no phlebitis or edema.  There is no cyanosis or clubbing.  Strength is normal and symmetrical in all extremities.  There is no lateralizing weakness.  There are no sensory deficits.  The skin is warm and dry.  There is no rash.     Assessment / Plan: I think a lot of her recent symptoms of not feeling well are related to depression.  Her recent lab work was unremarkable.  Trial of Zoloft.  Recheck in 2 months for office visit

## 2013-03-06 NOTE — Patient Instructions (Addendum)
START ZOLOFT (SERTRALINE) 25 MG DAILY, RX SENT TO [facility]  Your physician recommends that you schedule a follow-up appointment in: 2 months

## 2013-03-11 ENCOUNTER — Ambulatory Visit: Payer: Medicare Other | Admitting: Cardiology

## 2013-03-20 ENCOUNTER — Ambulatory Visit: Payer: Medicare Other | Admitting: Cardiology

## 2013-04-01 NOTE — Telephone Encounter (Signed)
New problem   Pt want to talk to nurse about home therapy. Please call pt.

## 2013-04-01 NOTE — Telephone Encounter (Signed)
Scheduled appointment for patient to see  Dr. Brackbill next week 

## 2013-04-02 ENCOUNTER — Other Ambulatory Visit: Payer: Self-pay | Admitting: Cardiology

## 2013-04-09 ENCOUNTER — Telehealth: Payer: Self-pay | Admitting: Cardiology

## 2013-04-09 NOTE — Telephone Encounter (Signed)
We can give her cipro 250 mg BID for 3 days #6

## 2013-04-09 NOTE — Telephone Encounter (Signed)
Burns when urinates, right sided lower back pain, and urine dark. Patient was wanting meds to be called in. Does have ov Friday. Will forward to  Dr. Patty Sermons for review

## 2013-04-09 NOTE — Telephone Encounter (Signed)
New Prob  Pt believes she has UTI and wants the doctor to call something in for her. She said she is going to get some OTC medicine for now.

## 2013-04-10 MED ORDER — CIPROFLOXACIN HCL 250 MG PO TABS: 250.0000 mg | ORAL_TABLET | Freq: Two times a day (BID) | ORAL | Status: DC

## 2013-04-10 NOTE — Telephone Encounter (Signed)
Advised patient and will send to [facility]

## 2013-04-12 ENCOUNTER — Encounter: Payer: Self-pay | Admitting: Cardiology

## 2013-04-12 ENCOUNTER — Ambulatory Visit (INDEPENDENT_AMBULATORY_CARE_PROVIDER_SITE_OTHER): Payer: Medicare Other | Admitting: Cardiology

## 2013-04-12 VITALS — BP 122/62 | HR 62 | Wt 142.5 lb

## 2013-04-12 DIAGNOSIS — N390 Urinary tract infection, site not specified: Secondary | ICD-10-CM

## 2013-04-12 DIAGNOSIS — I10 Essential (primary) hypertension: Secondary | ICD-10-CM

## 2013-04-12 DIAGNOSIS — R270 Ataxia, unspecified: Secondary | ICD-10-CM

## 2013-04-12 DIAGNOSIS — R5381 Other malaise: Secondary | ICD-10-CM

## 2013-04-12 DIAGNOSIS — R279 Unspecified lack of coordination: Secondary | ICD-10-CM

## 2013-04-12 DIAGNOSIS — E7800 Pure hypercholesterolemia, unspecified: Secondary | ICD-10-CM

## 2013-04-12 LAB — URINALYSIS, ROUTINE W REFLEX MICROSCOPIC
Total Protein, Urine: 100
Urine Glucose: 100
Urobilinogen, UA: 4 (ref 0.0–1.0)

## 2013-04-12 LAB — BASIC METABOLIC PANEL
BUN: 44 mg/dL — ABNORMAL HIGH (ref 6–23)
Calcium: 9 mg/dL (ref 8.4–10.5)
GFR: 21.01 mL/min — ABNORMAL LOW (ref >60.00)
Glucose, Bld: 93 mg/dL (ref 70–99)
Potassium: 4.1 mEq/L (ref 3.5–5.1)
Sodium: 135 mEq/L (ref 135–145)

## 2013-04-12 NOTE — Patient Instructions (Addendum)
Will obtain labs today and call you with the results (bmet, urinalysis with c/s)  Will arrange for Home Health to come out  Your physician recommends that you schedule a follow-up appointment in: 2 month ov/ekg

## 2013-04-12 NOTE — Progress Notes (Signed)
Nicole Harding Date of Birth:  11-29-1923 [facility] [address] [place], [place]  [address] [phone]         Fax   [phone]  History of Present Illness: This pleasant 79 year old woman is seen for a scheduled followup office visit. She has a past history of high blood pressure associated with lightheadedness and dizziness. She has a history of irritable bowel syndrome and history of chronic renal insufficiency. She does not have any history of ischemic heart disease and she had a normal nuclear stress test in 2003 by Dr. Jenne Campus. She has a history of a remote echocardiogram by Dr. Guadlupe Spanish which was unremarkable. Since we last saw her she was placed on hydralazine for poorly controlled blood pressure. She is on 25 mg 4 times a day. The patient has a history of postmenopausal state with a bothersome night sweats and hot flashes. She has had a hysterectomy and she gets regular mammograms. She has a past history of subarachnoid hemorrhage and is making a good recovery. Over the past several months her husband died and she has been having symptoms of depression. She had emergency room visits to [facility] on 03/02/13 and 2 days later on 03/04/13 she went to [facility] because she just wasn't feeling well. On the first emergency room visit she did have a CT angiogram of the chest which did not show any pulmonary emboli. No evidence of a urinary tract infection was found at that time.  However several days ago she began to have symptoms of another urinary tract infection and on 04/10/13 we called her in a three-day course of Cipro 250 mg twice a day.  She feels somewhat better but is still having some mild dysuria and we are checking a urinalysis and culture today. The patient lives by herself.  Her daughter-in-law brought her today.  The patient has problems with weakness and balance.  Her weight is down 6 pounds since last visit.  The family is anxious for  the patient to have some physical therapy to try to strengthen her muscles and improve her balance so that she does not fall.  The patient is homebound and does not drive a car.  We will request home health RN and physical therapy for the patient.   Current Outpatient Prescriptions  Medication Sig Dispense Refill  . ciprofloxacin (CIPRO) 250 MG tablet Take 1 tablet (250 mg total) by mouth 2 (two) times daily.  6 tablet  0  . hydrALAZINE (APRESOLINE) 25 MG tablet Take 25 mg by mouth 4 (four) times daily.       Marland Kitchen HYDROcodone-acetaminophen (NORCO/VICODIN) 5-325 MG per tablet Take 1 tablet by mouth every 6 (six) hours as needed for pain.  10 tablet  0  . isosorbide mononitrate (IMDUR) 30 MG 24 hr tablet TAKE ONE TABLET EACH DAY  30 tablet  3  . metoprolol succinate (TOPROL-XL) 25 MG 24 hr tablet Take 25 mg by mouth 2 (two) times daily. *takes 2nd tablet if blood pressure over 150      . Multiple Vitamin (MULITIVITAMIN WITH MINERALS) TABS Take 1 tablet by mouth daily.      . sertraline (ZOLOFT) 25 MG tablet Take 1 tablet (25 mg total) by mouth daily.  30 tablet  5   No current facility-administered medications for this visit.    Allergies  Allergen Reactions  . Amlodipine Nausea And Vomiting and Swelling    Swelling --sick and ended up in hospital  N&V  . Codeine Nausea And Vomiting  . Lipitor [Atorvastatin Calcium] Other (See Comments)    "cramps my legs"  . Lisinopril Other (See Comments)    unknown  . Red Yeast Rice Other (See Comments)    unknown  . Zetia [Ezetimibe] Other (See Comments)    unknown    Patient Active Problem List   Diagnosis Date Noted  . Depression 03/06/2013  . History of pulmonary embolism 06/19/2012  . Warfarin anticoagulation 06/19/2012  . UTI (lower urinary tract infection) due to pseudomonas 06/19/2012  . SDH (subdural hematoma) 06/18/2012  . Metabolic encephalopathy 06/18/2012  . Sepsis due to Pseudomonas 06/18/2012  . Cervical myelopathy 06/07/2012  .  Rigors 06/05/2012  . Pulmonary embolism 06/01/2012  . UTI (urinary tract infection) 06/01/2012  . Pain in left shoulder  06/01/2012  . Nausea 06/01/2012  . CKD (chronic kidney disease) stage 4, GFR 15-29 ml/min 06/01/2012  . HTN (hypertension) 06/01/2012  . Accelerated hypertension 06/01/2012  . Fall-recurrent 06/01/2012  . Benign hypertensive heart disease without heart failure 12/13/2011  . Dizziness - light-headed 12/13/2011  . Chronic renal insufficiency, stage III (moderate) 12/13/2011  . Pure hypercholesterolemia 09/26/2011  . Vertigo   . History of orthostatic hypotension 02/21/2011  . Irritable bowel syndrome 02/21/2011  . Postmenopausal state 02/21/2011    History  Smoking status  . Former Smoker -- 1.00 packs/day for 20 years  . Types: Cigarettes  . Quit date: 02/14/1971  Smokeless tobacco  . Never Used    History  Alcohol Use No    Family History  Problem Relation Age of Onset  . Stroke Father     Review of Systems: Constitutional: no fever chills diaphoresis or fatigue or change in weight.  Head and neck: no hearing loss, no epistaxis, no photophobia or visual disturbance. Respiratory: No cough, shortness of breath or wheezing. Cardiovascular: No chest pain peripheral edema, palpitations. Gastrointestinal: No abdominal distention, no abdominal pain, no change in bowel habits hematochezia or melena. Genitourinary: No dysuria, no frequency, no urgency, no nocturia. Musculoskeletal:No arthralgias, no back pain, no gait disturbance or myalgias. Neurological: No dizziness, no headaches, no numbness, no seizures, no syncope, no weakness, no tremors. Hematologic: No lymphadenopathy, no easy bruising. Psychiatric: No confusion, no hallucinations, no sleep disturbance.    Physical Exam: Filed Vitals:   04/12/13 1136  BP: 122/62  Pulse: 62   the general appearance reveals a thin elderly woman in no acute distress.  Her weight is down 6 pounds since last  visit.The head and neck exam reveals pupils equal and reactive.  Extraocular movements are full.  There is no scleral icterus.  The mouth and pharynx are normal.  The neck is supple.  The carotids reveal no bruits.  The jugular venous pressure is normal.  The  thyroid is not enlarged.  There is no lymphadenopathy.  The chest is clear to percussion and auscultation.  There are no rales or rhonchi.  Expansion of the chest is symmetrical.  The precordium is quiet.  The first heart sound is normal.  The second heart sound is physiologically split.  There is no murmur gallop rub or click.  There is no abnormal lift or heave.  The abdomen is soft and nontender.  The bowel sounds are normal.  The liver and spleen are not enlarged.  There are no abdominal masses.  There are no abdominal bruits.  Extremities reveal good pedal pulses.  There is no phlebitis or edema.  There is no cyanosis or  clubbing.  Strength reveals that she requires assistance in getting up to examining table.  There is no lateralizing weakness.  There are no sensory deficits.  The skin is warm and dry.  There is no rash.     Assessment / Plan: Continue same medication.  We're checking a basal metabolic panel today to followup on her prior evidence of renal insufficiency.  We are also getting a urinalysis to followup on recent urinary tract symptoms.  Recheck in 2 months for followup office visit and EKG.

## 2013-04-12 NOTE — Assessment & Plan Note (Addendum)
The patient has a past history of hypercholesterolemia.  She is not presently on statin therapy.  With her progressive weight loss she does not need any additional medication at this time.  We are encouraging her to eat more protein

## 2013-04-12 NOTE — Assessment & Plan Note (Signed)
Her blood pressure was remaining stable on current medications including hydralazine

## 2013-04-12 NOTE — Assessment & Plan Note (Signed)
Patient is unsteady on her feet.  Family is concerned that she may fall.  We will arrange for home health physical therapy

## 2013-04-15 ENCOUNTER — Telehealth: Payer: Self-pay | Admitting: Cardiology

## 2013-04-15 LAB — URINE CULTURE: Colony Count: >=100000

## 2013-04-15 NOTE — Telephone Encounter (Signed)
At visit last week was giving meds for UTI. Need to know if the results are in for the test done on Friday and also still having problems.

## 2013-04-15 NOTE — Telephone Encounter (Signed)
Advised patient of lab results   Notes Recorded by [name], MD on 04/15/2013 at 1:53 PM Please report. The BUN and creatinine are slightly higher so she needs to drink plenty of water. Notes Recorded by [name], MD on 04/15/2013 at 1:52 PM Please report. The urine culture did not show any pathogenic organisms. No further antibiotics needed at this point. If symptoms recur we will want her to see a urologist.

## 2013-04-26 ENCOUNTER — Telehealth: Payer: Self-pay | Admitting: Cardiology

## 2013-04-26 DIAGNOSIS — R399 Unspecified symptoms and signs involving the genitourinary system: Secondary | ICD-10-CM

## 2013-04-26 MED ORDER — CIPROFLOXACIN HCL 250 MG PO TABS: 250.0000 mg | ORAL_TABLET | Freq: Two times a day (BID) | ORAL | Status: DC

## 2013-04-26 NOTE — Telephone Encounter (Signed)
Okay to refill Cipro 250 mg for 5 days.  I also want to have her see a urologist because of these recurrent UTI symptoms.

## 2013-04-26 NOTE — Telephone Encounter (Signed)
Will forward to  Dr. Brackbill for review 

## 2013-04-26 NOTE — Telephone Encounter (Signed)
Advise patient and will call urology for appointment

## 2013-04-26 NOTE — Telephone Encounter (Signed)
Patient is still experiencing pain with urination.  She wanted to know if we can prescribe another medication--and call to [facility] pharmacy.

## 2013-04-29 NOTE — Telephone Encounter (Signed)
May 13, 2013 at 1:30 Dr MaCDiarmid, advised patient and sent information to MD

## 2013-05-06 ENCOUNTER — Ambulatory Visit: Payer: Medicare Other | Admitting: Cardiology

## 2013-06-11 ENCOUNTER — Encounter: Payer: Self-pay | Admitting: Cardiology

## 2013-06-11 ENCOUNTER — Ambulatory Visit (INDEPENDENT_AMBULATORY_CARE_PROVIDER_SITE_OTHER): Payer: Medicare Other | Admitting: Cardiology

## 2013-06-11 ENCOUNTER — Other Ambulatory Visit: Payer: Self-pay | Admitting: Cardiology

## 2013-06-11 VITALS — BP 160/78 | HR 65 | Ht 67.0 in | Wt 143.0 lb

## 2013-06-11 DIAGNOSIS — I119 Hypertensive heart disease without heart failure: Secondary | ICD-10-CM

## 2013-06-11 DIAGNOSIS — J438 Other emphysema: Secondary | ICD-10-CM

## 2013-06-11 DIAGNOSIS — I10 Essential (primary) hypertension: Secondary | ICD-10-CM

## 2013-06-11 DIAGNOSIS — R5381 Other malaise: Secondary | ICD-10-CM

## 2013-06-11 DIAGNOSIS — R6889 Other general symptoms and signs: Secondary | ICD-10-CM

## 2013-06-11 DIAGNOSIS — R0609 Other forms of dyspnea: Secondary | ICD-10-CM

## 2013-06-11 DIAGNOSIS — J439 Emphysema, unspecified: Secondary | ICD-10-CM

## 2013-06-11 LAB — BASIC METABOLIC PANEL
Chloride: 114 mEq/L — ABNORMAL HIGH (ref 96–112)
GFR: 26.57 mL/min — ABNORMAL LOW (ref >60.00)
Potassium: 4.7 mEq/L (ref 3.5–5.1)
Sodium: 139 mEq/L (ref 135–145)

## 2013-06-11 LAB — CBC WITH DIFFERENTIAL/PLATELET
Basophils Relative: 0.7 % (ref 0.0–3.0)
Eosinophils Relative: 1.6 % (ref 0.0–5.0)
HCT: 36.5 % (ref 36.0–46.0)
Hemoglobin: 12.1 g/dL (ref 12.0–15.0)
Lymphs Abs: 1.1 10*3/uL (ref 0.7–4.0)
Monocytes Relative: 6.7 % (ref 3.0–12.0)
Platelets: 314 10*3/uL (ref 150.0–400.0)
RBC: 4.15 Mil/uL (ref 3.87–5.11)
WBC: 8.6 10*3/uL (ref 4.5–10.5)

## 2013-06-11 LAB — TSH: TSH: 2.04 u[IU]/mL (ref 0.35–5.50)

## 2013-06-11 NOTE — Patient Instructions (Signed)
Will obtain labs today and call you with the results (cbc/tsh/bmet)  Will arrange for you to see Dr Jetty Duhamel  Your physician recommends that you continue on your current medications as directed. Please refer to the Current Medication list given to you today.  Your physician recommends that you schedule a follow-up appointment in: 3 month ov

## 2013-06-11 NOTE — Assessment & Plan Note (Signed)
The patient has a radiologic diagnosis of emphysema based on chest x-ray.  The patient smoked many decades ago.  She does not have any productive cough.  No chills or fever.  We will refer her to Dr. Maple Hudson to evaluate her question of emphysema further.  At the present time she is not on any inhalers etc.

## 2013-06-11 NOTE — Progress Notes (Signed)
Nicole Harding Date of Birth:  April 20, 1924 [address] [place], [place]  [address] [phone]         Fax   [phone]  History of Present Illness: This pleasant 79 year old woman is seen for a scheduled followup office visit. She has a past history of high blood pressure associated with lightheadedness and dizziness. She has a history of irritable bowel syndrome and history of chronic renal insufficiency. She does not have any history of ischemic heart disease and she had a normal nuclear stress test in 2003 by Dr. Jenne Campus. She has a history of a remote echocardiogram by Dr. Guadlupe Spanish which was unremarkable. Since we last saw her she was placed on hydralazine for poorly controlled blood pressure. She is on 25 mg 4 times a day. The patient has a history of postmenopausal state with a bothersome night sweats and hot flashes. She has had a hysterectomy and she gets regular mammograms. She has a past history of subarachnoid hemorrhage and is making a good recovery. Over the past several months her husband died and she has been having symptoms of depression. She had emergency room visits to [facility] on 03/02/13 and 2 days later on 03/04/13 she went to [facility] because she just wasn't feeling well. On the first emergency room visit she did have a CT angiogram of the chest which did not show any pulmonary emboli. No evidence of a urinary tract infection was found at that time.  However several days ago she began to have symptoms of another urinary tract infection and on 04/10/13 we called her in a three-day course of Cipro 250 mg twice a day.  She feels somewhat better but is still having some mild dysuria and we are checking a urinalysis and culture today. The patient lives by herself.  Since last visit she has had a course of physical therapy and now the family has arranged for her to work out with a Systems analyst 3 times a week to build up her strength.  The  family plans to take her on a family trip to [place] from December 18 until January 5.  The patient is still having problems with shortness of breath.  Her last echocardiogram 06/01/12 showed normal systolic function with an EF of 55-60%.  Diastolic function was not commented on.  Chest x-rays from July 2014 showed emphysema but no CHF.  Current Outpatient Prescriptions  Medication Sig Dispense Refill  . hydrALAZINE (APRESOLINE) 25 MG tablet Take 25 mg by mouth 4 (four) times daily.       . isosorbide mononitrate (IMDUR) 30 MG 24 hr tablet TAKE ONE TABLET EACH DAY  30 tablet  3  . metoprolol succinate (TOPROL-XL) 25 MG 24 hr tablet Take 25 mg by mouth 2 (two) times daily. *takes 2nd tablet if blood pressure over 150      . Multiple Vitamin (MULITIVITAMIN WITH MINERALS) TABS Take 1 tablet by mouth daily.       No current facility-administered medications for this visit.    Allergies  Allergen Reactions  . Amlodipine Nausea And Vomiting and Swelling    Swelling --sick and ended up in hospital N&V  . Codeine Nausea And Vomiting  . Lipitor [Atorvastatin Calcium] Other (See Comments)    "cramps my legs"  . Lisinopril Other (See Comments)    unknown  . Red Yeast Rice Other (See Comments)    unknown  . Zetia [Ezetimibe] Other (See Comments)  unknown    Patient Active Problem List   Diagnosis Date Noted  . Ataxia 04/12/2013  . Depression 03/06/2013  . History of pulmonary embolism 06/19/2012  . Warfarin anticoagulation 06/19/2012  . UTI (lower urinary tract infection) due to pseudomonas 06/19/2012  . SDH (subdural hematoma) 06/18/2012  . Metabolic encephalopathy 06/18/2012  . Sepsis due to Pseudomonas 06/18/2012  . Cervical myelopathy 06/07/2012  . Rigors 06/05/2012  . Pulmonary embolism 06/01/2012  . UTI (urinary tract infection) 06/01/2012  . Pain in left shoulder  06/01/2012  . Nausea 06/01/2012  . CKD (chronic kidney disease) stage 4, GFR 15-29 ml/min 06/01/2012  . HTN  (hypertension) 06/01/2012  . Accelerated hypertension 06/01/2012  . Fall-recurrent 06/01/2012  . Benign hypertensive heart disease without heart failure 12/13/2011  . Dizziness - light-headed 12/13/2011  . Chronic renal insufficiency, stage III (moderate) 12/13/2011  . Pure hypercholesterolemia 09/26/2011  . Vertigo   . History of orthostatic hypotension 02/21/2011  . Irritable bowel syndrome 02/21/2011  . Postmenopausal state 02/21/2011    History  Smoking status  . Former Smoker -- 1.00 packs/day for 20 years  . Types: Cigarettes  . Quit date: 02/14/1971  Smokeless tobacco  . Never Used    History  Alcohol Use No    Family History  Problem Relation Age of Onset  . Stroke Father     Review of Systems: Constitutional: no fever chills diaphoresis or fatigue or change in weight.  Head and neck: no hearing loss, no epistaxis, no photophobia or visual disturbance. Respiratory: No cough, shortness of breath or wheezing. Cardiovascular: No chest pain peripheral edema, palpitations. Gastrointestinal: No abdominal distention, no abdominal pain, no change in bowel habits hematochezia or melena. Genitourinary: No dysuria, no frequency, no urgency, no nocturia. Musculoskeletal:No arthralgias, no back pain, no gait disturbance or myalgias. Neurological: No dizziness, no headaches, no numbness, no seizures, no syncope, no weakness, no tremors. Hematologic: No lymphadenopathy, no easy bruising. Psychiatric: No confusion, no hallucinations, no sleep disturbance.    Physical Exam: Filed Vitals:   06/11/13 1157  BP: 160/78  Pulse:    the general appearance reveals a thin elderly woman in no acute distress.  Her weight is down 6 pounds since last visit.The head and neck exam reveals pupils equal and reactive.  Extraocular movements are full.  There is no scleral icterus.  The mouth and pharynx are normal.  The neck is supple.  The carotids reveal no bruits.  The jugular venous  pressure is normal.  The  thyroid is not enlarged.  There is no lymphadenopathy.  The chest is clear to percussion and auscultation.  There are no rales or rhonchi.  Expansion of the chest is symmetrical.  The precordium is quiet.  The first heart sound is normal.  The second heart sound is physiologically split.  There is no murmur gallop rub or click.  There is no abnormal lift or heave.  The abdomen is soft and nontender.  The bowel sounds are normal.  The liver and spleen are not enlarged.  There are no abdominal masses.  There are no abdominal bruits.  Extremities reveal good pedal pulses.  There is no phlebitis or edema.  There is no cyanosis or clubbing.  Strength reveals that she requires assistance in getting up to examining table.  There is no lateralizing weakness.  There are no sensory deficits.  The skin is warm and dry.  There is no rash.  Normal sinus rhythm with first degree AV block.  Assessment /  Plan: The patient will continue same medication.  We will refer her to pulmonary regarding question of emphysema and exertional dyspnea.  Recheck here in 3 months for followup office visit.  She hopes to be strong enough to join her family traveling to [place] 07/25/13 until 08/12/13.

## 2013-06-11 NOTE — Assessment & Plan Note (Signed)
Patient complains of being intolerant of cold.  She is wondering whether her thyroid is underactive.  She has also had some hair loss.  Will check TSH today.  We are also checking a CBC and a basal metabolic panel

## 2013-06-11 NOTE — Progress Notes (Signed)
Quick Note:  Please report to patient. The recent labs are stable. Continue same medication and careful diet. Thyroid normal. Hgb stable. WBC normal. Kidneys are still dry with increased BUN although creatinine is slightly improved. Needs to drink more water. Her sensation of feeling cold may be from not drinking enough water and fluids.. ______

## 2013-06-11 NOTE — Assessment & Plan Note (Signed)
Blood pressure was high on arrival.  We rechecked it and it is coming down to 160/78.  At home her blood pressure has been measuring normal levels

## 2013-06-12 ENCOUNTER — Telehealth: Payer: Self-pay | Admitting: *Deleted

## 2013-06-12 NOTE — Telephone Encounter (Signed)
Advised patient of lab results  

## 2013-06-12 NOTE — Telephone Encounter (Signed)
Message copied by Burnell Blanks on Wed Jun 12, 2013  5:46 PM ------      Message from: [name]      Created: Tue Jun 11, 2013  5:05 PM       Please report to patient.  The recent labs are stable. Continue same medication and careful diet. Thyroid normal. Hgb stable. WBC normal. Kidneys are still dry with increased BUN although creatinine is slightly improved. Needs to drink more water. Her sensation of feeling cold may be from not drinking enough water and fluids.Marland Kitchen ------

## 2013-06-13 ENCOUNTER — Other Ambulatory Visit: Payer: Self-pay

## 2013-06-21 ENCOUNTER — Ambulatory Visit (INDEPENDENT_AMBULATORY_CARE_PROVIDER_SITE_OTHER): Payer: Medicare Other | Admitting: Internal Medicine

## 2013-06-21 ENCOUNTER — Encounter: Payer: Self-pay | Admitting: Internal Medicine

## 2013-06-21 VITALS — BP 156/66 | HR 65 | Temp 97.6°F | Ht 64.75 in | Wt 144.0 lb

## 2013-06-21 DIAGNOSIS — J439 Emphysema, unspecified: Secondary | ICD-10-CM

## 2013-06-21 DIAGNOSIS — K219 Gastro-esophageal reflux disease without esophagitis: Secondary | ICD-10-CM

## 2013-06-21 DIAGNOSIS — J9610 Chronic respiratory failure, unspecified whether with hypoxia or hypercapnia: Secondary | ICD-10-CM

## 2013-06-21 DIAGNOSIS — J438 Other emphysema: Secondary | ICD-10-CM

## 2013-06-21 MED ORDER — FAMOTIDINE 20 MG PO TABS: ORAL_TABLET | ORAL | Status: DC

## 2013-06-21 MED ORDER — PANTOPRAZOLE SODIUM 40 MG PO TBEC: 40.0000 mg | DELAYED_RELEASE_TABLET | Freq: Every day | ORAL | Status: DC

## 2013-06-21 NOTE — Patient Instructions (Signed)
Pantoprazole (protonix) 40 mg   Take 30-60 min before first meal of the day and Pepcid 20 mg one bedtime until return to office - this is the best way to tell whether stomach acid is contributing to your problem.    GERD (REFLUX)  is an extremely common cause of respiratory symptoms, many times with no significant heartburn at all.    It can be treated with medication, but also with lifestyle changes including avoidance of late meals, excessive alcohol, smoking cessation, and avoid fatty foods, chocolate, peppermint, colas, red wine, and acidic juices such as orange juice.  NO MINT OR MENTHOL PRODUCTS SO NO COUGH DROPS  USE SUGARLESS CANDY INSTEAD (jolley ranchers or Stover's)  NO OIL BASED VITAMINS - use powdered substitutes.    Please schedule a follow up office visit in 4 weeks, sooner if needed  

## 2013-06-21 NOTE — Progress Notes (Signed)
  Subjective:    Patient ID: Nicole Harding, female    DOB: [birth year]   MRN: [id number]  HPI  89 yowf quit smoking 1950 with no resp problems until around 2012 PE Oct 2013 incidentally discovered p fell and "breathing never right since" referred to pulmonary 06/21/13 by Dr Patty Sermons for copd eval.     06/21/2013 1st [facility] office visit/ Joenathan Sakuma cc doe x 2 years x across the room Uses 02 at home @ 2lpm but can't do portable "too heavy" Does treadmill for up to half a mile per daughter s 38! Does not recall using inhalers to date.  No obvious day to day or daytime variabilty or assoc chronic cough or cp or chest tightness, subjective wheeze overt sinus or hb symptoms. No unusual exp hx or h/o childhood pna/ asthma or knowledge of premature birth.  Sleeping ok without nocturnal  or early am exacerbation  of respiratory  c/o's or need for noct saba. Also denies any obvious fluctuation of symptoms with weather or environmental changes or other aggravating or alleviating factors except as outlined above   Current Medications, Allergies, Complete Past Medical History, Past Surgical History, Family History, and Social History were reviewed in [employer] record.          Review of Systems  Constitutional: Positive for appetite change. Negative for fever, chills and unexpected weight change.  HENT: Negative for congestion, dental problem, ear pain, nosebleeds, postnasal drip, rhinorrhea, sinus pressure, sneezing, sore throat, trouble swallowing and voice change.   Eyes: Negative for visual disturbance.  Respiratory: Positive for shortness of breath. Negative for cough and choking.   Cardiovascular: Negative for chest pain and leg swelling.  Gastrointestinal: Negative for vomiting, abdominal pain and diarrhea.  Genitourinary: Negative for difficulty urinating.  Musculoskeletal: Positive for arthralgias.  Skin: Negative for rash.  Neurological: Negative for  tremors, syncope and headaches.  Hematological: Does not bruise/bleed easily.  Psychiatric/Behavioral: Positive for dysphoric mood. The patient is nervous/anxious.        Objective:   Physical Exam  amb wf needs one person assist to get on exam table  Wt Readings from Last 3 Encounters:  06/21/13 144 lb (65.318 kg)  06/11/13 143 lb (64.864 kg)  04/12/13 142 lb 8 oz (64.638 kg)       HEENT mild turbinate edema.  Oropharynx no thrush or excess pnd or cobblestoning.  No JVD or cervical adenopathy. Mild accessory muscle hypertrophy. Trachea midline, nl thryroid. Chest was hyperinflated by percussion with diminished breath sounds and moderate increased exp time without wheeze. Hoover sign positive at mid inspiration. Regular rate and rhythm without murmur gallop or rub or increase P2 or edema.  Abd: no hsm, nl excursion. Ext warm without cyanosis or clubbing.       V/q 03/02/13  1. Pulmonary embolism absent (low probability for pulmonary  embolism)  2. Mottled aeration of the bilateral lungs, right greater than  left, suggestive of air trapping/airways disease.   cxr 03/03/13  Underlying emphysematous change. No edema or consolidation.   UGI 06/05/12  IMPRESSION:  1. Spontaneous gastroesophageal reflux. No stricture.  2. Mild tertiary contractions in the distal esophagus      Assessment & Plan:

## 2013-06-23 DIAGNOSIS — J9610 Chronic respiratory failure, unspecified whether with hypoxia or hypercapnia: Secondary | ICD-10-CM | POA: Insufficient documentation

## 2013-06-23 DIAGNOSIS — K219 Gastro-esophageal reflux disease without esophagitis: Secondary | ICD-10-CM | POA: Insufficient documentation

## 2013-06-23 MED ORDER — CITRIC ACID-SODIUM CITRATE 334-500 MG/5ML PO SOLN: ORAL | Status: DC

## 2013-06-23 NOTE — Assessment & Plan Note (Signed)
?   Acid (or non-acid) GERD > always difficult to exclude as up to 75% of pts in some series report no assoc GI/ Heartburn symptoms> rec max (24h)  acid suppression and diet restrictions/ reviewed and instructions given in writting

## 2013-06-23 NOTE — Assessment & Plan Note (Signed)
Add oracit for HC03 17

## 2013-06-23 NOTE — Assessment & Plan Note (Signed)
06/21/2013   Walked RA x one lap @ 185 stopped due to  desat to 84%   She is already on home 02 and qualifies for 02 but reluctant to use it , advised to monitor sats with ex and keep above 90% ideally

## 2013-06-23 NOTE — Assessment & Plan Note (Signed)
  When respiratory symptoms begin or become refractory well after a patient reports complete smoking cessation,  Especially when this wasn't the case while they were smoking, a red flag is raised based on the work of Dr Primitivo Gauze which states:  if you quit smoking when your best day FEV1 is still well preserved it is highly unlikely you will progress to severe disease.  That is to say, once the smoking stops,  the symptoms should not suddenly erupt or markedly worsen.  If so, the differential diagnosis should include  obesity/deconditioning,  LPR/Reflux/Aspiration syndromes,  occult CHF, or  especially side effect of medications commonly used in this population.    Since she has documented GERD it would make sense to treat this first (See GERD a/p)  Also note she has CRI with hc03 17 so would probably benefit from oracit rx - will start this and have her return asap for pfts and consider rx for LAMA first since really has no sign AB component.

## 2013-06-24 ENCOUNTER — Telehealth: Payer: Self-pay | Admitting: *Deleted

## 2013-06-24 NOTE — Telephone Encounter (Signed)
Spoke with pt and notified of recs per Dr. Wert. Pt verbalized understanding and denied any questions.  

## 2013-06-24 NOTE — Telephone Encounter (Signed)
Message copied by Christen Butter on Mon Jun 24, 2013  1:13 PM ------      Message from: Sandrea Hughs B      Created: Sun Jun 23, 2013  7:14 AM       I called in oracit one tbsp per day - this should help her be less short of breath ------

## 2013-07-08 ENCOUNTER — Telehealth: Payer: Self-pay | Admitting: *Deleted

## 2013-07-08 DIAGNOSIS — J439 Emphysema, unspecified: Secondary | ICD-10-CM

## 2013-07-08 DIAGNOSIS — J9610 Chronic respiratory failure, unspecified whether with hypoxia or hypercapnia: Secondary | ICD-10-CM

## 2013-07-08 NOTE — Telephone Encounter (Signed)
Please see questionnaire below. Pt still experiencing increased symptoms.       Questionnaire: Questionnaire      Question: How are you feeling after your recent visit?   Answer: still have shortness of breath      Question: Does the recommended course of treatment seem to be helping your symptoms?   Answer: maybe a little      Question: Are you experiencing any side effects from your recommended treatment?   Answer: possible weakness worsened      Question: is there anything else you would like to ask your physician?   Answer: would an inhaler help? and portable oxygen for air travel to [place]?

## 2013-07-08 NOTE — Telephone Encounter (Signed)
ATC pt. Line rings busy x 4 wcb

## 2013-07-09 NOTE — Telephone Encounter (Signed)
Orders placed.Finley Dinkel, CMA  

## 2013-07-09 NOTE — Telephone Encounter (Signed)
ok 

## 2013-07-09 NOTE — Telephone Encounter (Signed)
I spoke with the pt and she states she is now interested in getting portable oxygen. Qualification test was done on 06-21-13 at last OV. Pt uses [facility] for her nocturnal oxygen. She also states she is going on a trip to [place] on 07-23-13 and will need oxygen arranged for this trip as well. I advised we can put this in the order that we send to [facility]. i also advised her that she will need to contact her airline that she is traveling with and get their requirements as well, usually a form we need to complete. Pt states understanding. Please advise if ok to send order for portable oxygen. Carron Curie, CMA

## 2013-07-18 ENCOUNTER — Ambulatory Visit: Payer: Medicare Other | Admitting: Internal Medicine

## 2013-07-18 ENCOUNTER — Telehealth: Payer: Self-pay | Admitting: Internal Medicine

## 2013-07-18 ENCOUNTER — Ambulatory Visit (INDEPENDENT_AMBULATORY_CARE_PROVIDER_SITE_OTHER): Payer: Medicare Other | Admitting: Internal Medicine

## 2013-07-18 ENCOUNTER — Encounter: Payer: Self-pay | Admitting: Internal Medicine

## 2013-07-18 ENCOUNTER — Other Ambulatory Visit (INDEPENDENT_AMBULATORY_CARE_PROVIDER_SITE_OTHER): Payer: Medicare Other

## 2013-07-18 VITALS — BP 130/80 | HR 68 | Temp 97.7°F | Ht 67.0 in | Wt 140.0 lb

## 2013-07-18 DIAGNOSIS — R0600 Dyspnea, unspecified: Secondary | ICD-10-CM | POA: Insufficient documentation

## 2013-07-18 DIAGNOSIS — J9610 Chronic respiratory failure, unspecified whether with hypoxia or hypercapnia: Secondary | ICD-10-CM

## 2013-07-18 DIAGNOSIS — R0989 Other specified symptoms and signs involving the circulatory and respiratory systems: Secondary | ICD-10-CM

## 2013-07-18 DIAGNOSIS — K219 Gastro-esophageal reflux disease without esophagitis: Secondary | ICD-10-CM

## 2013-07-18 DIAGNOSIS — R0609 Other forms of dyspnea: Secondary | ICD-10-CM

## 2013-07-18 DIAGNOSIS — N184 Chronic kidney disease, stage 4 (severe): Secondary | ICD-10-CM

## 2013-07-18 LAB — BASIC METABOLIC PANEL
CO2: 19 mEq/L (ref 19–32)
Chloride: 106 mEq/L (ref 96–112)
Creatinine, Ser: 2.5 mg/dL — ABNORMAL HIGH (ref 0.4–1.2)
Glucose, Bld: 98 mg/dL (ref 70–99)

## 2013-07-18 NOTE — Assessment & Plan Note (Signed)
-   V/q 03/02/13 low prob  - spirometry wnl 07/18/2013  - hc03 17 on 06/21/13 > rx oracit daily > 07/18/2013 = 19 so increase  to bid   Not clear why she desats but does not meet criteria for copd or for that matter any form of airflow obstruction

## 2013-07-18 NOTE — Progress Notes (Signed)
Quick Note:  Spoke with pt and notified of results She states prefers that I talk with Raynelle Fanning (family member) I called Raynelle Fanning and LMTCB ______

## 2013-07-18 NOTE — Progress Notes (Signed)
Quick Note:  See PN 07/18/13 ______

## 2013-07-18 NOTE — Patient Instructions (Addendum)
Continue Pantoprazole (protonix) 40 mg   Take 30-60 min before first meal of the day and Pepcid 20 mg one bedtime until return to office - this is the best way to tell whether stomach acid is contributing to your problem.   Please see patient coordinator before you leave today  to schedule overnight 02 sats Room Air   Please remember to go to the lab   department downstairs for your tests - we will call you with the results when they are available. Add : increase oracit to bid

## 2013-07-18 NOTE — Telephone Encounter (Signed)
Notes Recorded by Nyoka Cowden, MD on 07/18/2013 at 1:34 PM Call patient : Studies show sltly worse renal function but better bicarb  rec Increase oracit to bid, avoid nsaids (Dr Patty Sermons notified and will take it from there)      Spoke with Raynelle Fanning and notified of recs per MW She verbalized understanding and states nothing further needed Will inform the pt

## 2013-07-18 NOTE — Assessment & Plan Note (Signed)
06/21/2013   Walked RA x one lap @ 185 stopped due to  desat to 84%  - 07/18/2013 Walked RA xone Lap @ 185 ft each stopped due to  87% - ono RA ordered 07/18/2013   Offered to set her up for amb 02 if interested and in meantime should check ono RA to be sure safe to stop it

## 2013-07-18 NOTE — Progress Notes (Signed)
Subjective:    Patient ID: Nicole Harding, female    DOB: [birth year]   MRN: [id number]    Brief patient profile:  79 yowf quit smoking 1950 with no resp problems until around  PE Oct 2013 incidentally discovered p fell and "breathing never right since" referred to pulmonary 06/21/13 by Dr Patty Sermons for copd eval.    History of Present Illness  06/21/2013 1st [facility] office visit/ Nicole Harding cc doe x 2 years x across the room Uses 02 at home @ 2lpm but can't do portable "too heavy" Does treadmill for up to half a mile per daughter s 24! Does not recall using inhalers to date. rec Pantoprazole (protonix) 40 mg   Take 30-60 min before first meal of the day and Pepcid 20 mg one bedtime until return     GERD Add oracit one tbsp ad    07/18/2013 f/u ov/Nicole Harding re: uneplained sob assoc with CRI Chief Complaint  Patient presents with  . Follow-up    Pt states her breathing is slightly better since her last appt. No new co's today.   not using port 02 with activity  Stopped using 02 at hs  X 3-4 days > can't tell any difference Up to 3/4 mile now on tredmill in 30 min s 02    No obvious day to day or daytime variabilty or assoc chronic cough or cp or chest tightness, subjective wheeze overt sinus or hb symptoms. No unusual exp hx or h/o childhood pna/ asthma or knowledge of premature birth.  Sleeping ok without nocturnal  or early am exacerbation  of respiratory  c/o's or need for noct saba. Also denies any obvious fluctuation of symptoms with weather or environmental changes or other aggravating or alleviating factors except as outlined above   Current Medications, Allergies, Complete Past Medical History, Past Surgical History, Family History, and Social History were reviewed in [employer] record.  ROS  The following are not active complaints unless bolded sore throat, dysphagia, dental problems, itching, sneezing,  nasal congestion or excess/ purulent  secretions, ear ache,   fever, chills, sweats, unintended wt loss, pleuritic or exertional cp, hemoptysis,  orthopnea pnd or leg swelling, presyncope, palpitations, heartburn, abdominal pain, anorexia, nausea, vomiting, diarrhea  or change in bowel or urinary habits, change in stools or urine, dysuria,hematuria,  rash, arthralgias, visual complaints, headache, numbness weakness or ataxia or problems with walking or coordination,  change in mood/affect or memory.                     Objective:   Physical Exam  amb wf no longer needs assistance to climb on table  07/18/2013      140  Wt Readings from Last 3 Encounters:  06/21/13 144 lb (65.318 kg)  06/11/13 143 lb (64.864 kg)  04/12/13 142 lb 8 oz (64.638 kg)       HEENT: nl dentition, turbinates, and orophanx. Nl external ear canals without cough reflex   NECK :  without JVD/Nodes/TM/ nl carotid upstrokes bilaterally   LUNGS: no acc muscle use, clear to A and P bilaterally without cough on insp or exp maneuvers   CV:  RRR  no s3 or murmur or increase in P2, no edema   ABD:  soft and nontender with nl excursion in the supine position. No bruits or organomegaly, bowel sounds nl  MS:  warm without deformities, calf tenderness, cyanosis or clubbing  SKIN: warm and dry without lesions  NEURO:  alert, approp, no deficits        V/q 03/02/13  1. Pulmonary embolism absent (low probability for pulmonary  embolism)  2. Mottled aeration of the bilateral lungs, right greater than  left, suggestive of air trapping/airways disease.   cxr 03/03/13  Underlying emphysematous change. No edema or consolidation.   UGI 06/05/12  IMPRESSION:  1. Spontaneous gastroesophageal reflux. No stricture.  2. Mild tertiary contractions in the distal esophagus      Assessment & Plan:

## 2013-07-18 NOTE — Assessment & Plan Note (Signed)
UGI 06/05/12  IMPRESSION:  1. Spontaneous gastroesophageal reflux. No stricture.  2. Mild tertiary contractions in the distal esophagus  Improving on rx > reviewed diet/ elevation of head

## 2013-07-18 NOTE — Assessment & Plan Note (Signed)
Lab Results  Component Value Date   CREATININE 2.5* 07/18/2013   CREATININE 1.9* 06/11/2013   CREATININE 2.3* 04/12/2013   Increase oracit to bid, avoid nsaids

## 2013-07-20 ENCOUNTER — Other Ambulatory Visit: Payer: Self-pay | Admitting: Cardiology

## 2013-07-23 ENCOUNTER — Other Ambulatory Visit: Payer: Self-pay | Admitting: Internal Medicine

## 2013-07-23 MED ORDER — CYTRA-2 500-334 MG/5ML PO SOLN: ORAL | Status: DC

## 2013-08-16 ENCOUNTER — Telehealth: Payer: Self-pay | Admitting: Cardiology

## 2013-08-16 NOTE — Telephone Encounter (Signed)
Walk In pt Form " Independent [facility] Supportive living physicians Report" dropped Off will hold till  [place]

## 2013-08-19 ENCOUNTER — Institutional Professional Consult (permissible substitution): Payer: Medicare Other | Admitting: Internal Medicine

## 2013-08-21 ENCOUNTER — Telehealth: Payer: Self-pay | Admitting: Cardiology

## 2013-08-21 NOTE — Telephone Encounter (Signed)
Form not received at this time, will check Friday when back in the office

## 2013-08-21 NOTE — Telephone Encounter (Signed)
New Message  Nicole Harding [facility] [employer] [facility] [facility] requesting that we fax back the independant living form. She will fax a duplicate. Please call to discuss if needed.

## 2013-09-11 ENCOUNTER — Ambulatory Visit (INDEPENDENT_AMBULATORY_CARE_PROVIDER_SITE_OTHER): Payer: Medicare Other | Admitting: Cardiology

## 2013-09-11 ENCOUNTER — Encounter: Payer: Self-pay | Admitting: Cardiology

## 2013-09-11 VITALS — BP 146/72 | HR 58 | Ht 67.0 in | Wt 139.0 lb

## 2013-09-11 DIAGNOSIS — I119 Hypertensive heart disease without heart failure: Secondary | ICD-10-CM

## 2013-09-11 DIAGNOSIS — N183 Chronic kidney disease, stage 3 unspecified: Secondary | ICD-10-CM

## 2013-09-11 DIAGNOSIS — R0609 Other forms of dyspnea: Secondary | ICD-10-CM

## 2013-09-11 DIAGNOSIS — K589 Irritable bowel syndrome without diarrhea: Secondary | ICD-10-CM

## 2013-09-11 DIAGNOSIS — R0989 Other specified symptoms and signs involving the circulatory and respiratory systems: Secondary | ICD-10-CM

## 2013-09-11 DIAGNOSIS — J9610 Chronic respiratory failure, unspecified whether with hypoxia or hypercapnia: Secondary | ICD-10-CM

## 2013-09-11 NOTE — Assessment & Plan Note (Signed)
The patient caught a cold during the last portion of her stay in [place].  She is just about over that now.  Not having any sputum production fever or chills.  She is avoiding crowds.

## 2013-09-11 NOTE — Patient Instructions (Addendum)
ADD MIRALAX 17 G DAILY AS NEEDED FOR CONSTIPATION  Your physician wants you to follow-up in: 3 MONTH OV/EKG  You will receive a reminder letter in the mail two months in advance. If you don't receive a letter, please call our office to schedule the follow-up appointment.

## 2013-09-11 NOTE — Assessment & Plan Note (Signed)
Blood pressure was remaining stable on current therapy.  No dizziness or syncope.  She walks with a cane to help with her balance.

## 2013-09-11 NOTE — Assessment & Plan Note (Signed)
The patient has a history of irritable bowel syndrome and she has been having a tendency toward constipation.  She is having to strain at stool periodically.  I suggested that she try some over-the-counter MiraLax as necessary to keep her bowels moving

## 2013-09-11 NOTE — Progress Notes (Signed)
Nicole Harding Date of Birth:  04-Feb-1924 [address] [place], [place]  [address] [phone]         Fax   [phone]  History of Present Illness: This pleasant 80 year old woman is seen for a scheduled followup office visit. She has a past history of high blood pressure associated with lightheadedness and dizziness. She has a history of irritable bowel syndrome and history of chronic renal insufficiency. She does not have any history of ischemic heart disease and she had a normal nuclear stress test in 2003 by Dr. Jenne Campus. She has a history of a remote echocardiogram by Dr. Guadlupe Spanish which was unremarkable. Since we last saw her she was placed on hydralazine for poorly controlled blood pressure. She is on 25 mg 4 times a day. The patient has a history of postmenopausal state with a bothersome night sweats and hot flashes. She has had a hysterectomy and she gets regular mammograms. She has a past history of subarachnoid hemorrhage and is making a good recovery. Over the past several months her husband died and she has been having symptoms of depression. She had emergency room visits to [facility] on 03/02/13 and 2 days later on 03/04/13 she went to [facility] because she just wasn't feeling well. On the first emergency room visit she did have a CT angiogram of the chest which did not show any pulmonary emboli. No evidence of a urinary tract infection was found at that time.  However several days ago she began to have symptoms of another urinary tract infection and on 04/10/13 we called her in a three-day course of Cipro 250 mg twice a day.  She feels somewhat better but is still having some mild dysuria and we are checking a urinalysis and culture today.  Her last echocardiogram 06/01/12 showed normal systolic function with an EF of 55-60%.  Diastolic function was not commented on.  Chest x-rays from July 2014 showed emphysema but no CHF.  Since we last saw the  patient she had a nice trip to [place] to visit her grandson.  Also the patient is now living in [place] and this appears to be working out very well.  Current Outpatient Prescriptions  Medication Sig Dispense Refill  . citric acid-sodium citrate (ORACIT) 334-500 MG/5ML solution One tbsp daily  473 mL  11  . famotidine (PEPCID) 20 MG tablet One at bedtime  30 tablet  2  . hydrALAZINE (APRESOLINE) 25 MG tablet Take 25 mg by mouth 4 (four) times daily.       . isosorbide mononitrate (IMDUR) 30 MG 24 hr tablet TAKE ONE TABLET EVERY DAY  30 tablet  3  . metoprolol succinate (TOPROL-XL) 25 MG 24 hr tablet TAKE ONE TABLET TWICE DAILY -IF BLOOD PRESSURE GOES ABOVE 150 TAKE ANEXTRA PILL  60 tablet  2  . Multiple Vitamin (MULITIVITAMIN WITH MINERALS) TABS Take 1 tablet by mouth daily.      . pantoprazole (PROTONIX) 40 MG tablet Take 1 tablet (40 mg total) by mouth daily. Take 30-60 min before first meal of the day  30 tablet  2  . polyethylene glycol powder (MIRALAX) powder Take 1 Container by mouth as directed. 17 G DAILY AS NEEDED      . Sod Citrate-Citric Acid (CYTRA-2) 500-334 MG/5ML SOLN 1 tbsp twice daily  946 mL  0   No current facility-administered medications for this visit.    Allergies  Allergen Reactions  . Amlodipine Nausea And  Vomiting and Swelling    Swelling --sick and ended up in hospital N&V  . Codeine Nausea And Vomiting  . Lipitor [Atorvastatin Calcium] Other (See Comments)    "cramps my legs"  . Lisinopril Other (See Comments)    unknown  . Prozac [Fluoxetine Hcl] Nausea And Vomiting    syncope  . Red Yeast Rice Other (See Comments)    unknown  . Zetia [Ezetimibe] Other (See Comments)    unknown  . Zoloft [Sertraline Hcl]     Extreme weakness    Patient Active Problem List   Diagnosis Date Noted  . Dyspnea 07/18/2013  . GERD (gastroesophageal reflux disease) 06/23/2013  . Chronic respiratory failure 06/23/2013  . Emphysema 06/11/2013  . Cold intolerance  06/11/2013  . Ataxia 04/12/2013  . Depression 03/06/2013  . History of pulmonary embolism 06/19/2012  . Warfarin anticoagulation 06/19/2012  . UTI (lower urinary tract infection) due to pseudomonas 06/19/2012  . SDH (subdural hematoma) 06/18/2012  . Metabolic encephalopathy 06/18/2012  . Sepsis due to Pseudomonas 06/18/2012  . Cervical myelopathy 06/07/2012  . Rigors 06/05/2012  . Pulmonary embolism 06/01/2012  . UTI (urinary tract infection) 06/01/2012  . Pain in left shoulder  06/01/2012  . Nausea 06/01/2012  . CKD (chronic kidney disease) stage 4, GFR 15-29 ml/min 06/01/2012  . HTN (hypertension) 06/01/2012  . Accelerated hypertension 06/01/2012  . Fall-recurrent 06/01/2012  . Benign hypertensive heart disease without heart failure 12/13/2011  . Dizziness - light-headed 12/13/2011  . Chronic renal insufficiency, stage III (moderate) 12/13/2011  . Pure hypercholesterolemia 09/26/2011  . Vertigo   . History of orthostatic hypotension 02/21/2011  . Irritable bowel syndrome 02/21/2011  . Postmenopausal state 02/21/2011    History  Smoking status  . Former Smoker -- 1.00 packs/day for 20 years  . Types: Cigarettes  . Quit date: 02/14/1971  Smokeless tobacco  . Never Used    History  Alcohol Use No    Family History  Problem Relation Age of Onset  . Stroke Father   . Emphysema Brother     smoked  . Heart disease      siblings    Review of Systems: Constitutional: no fever chills diaphoresis or fatigue or change in weight.  Head and neck: no hearing loss, no epistaxis, no photophobia or visual disturbance. Respiratory: No cough, shortness of breath or wheezing. Cardiovascular: No chest pain peripheral edema, palpitations. Gastrointestinal: No abdominal distention, no abdominal pain, no change in bowel habits hematochezia or melena. Genitourinary: No dysuria, no frequency, no urgency, no nocturia. Musculoskeletal:No arthralgias, no back pain, no gait disturbance  or myalgias. Neurological: No dizziness, no headaches, no numbness, no seizures, no syncope, no weakness, no tremors. Hematologic: No lymphadenopathy, no easy bruising. Psychiatric: No confusion, no hallucinations, no sleep disturbance.    Physical Exam: Filed Vitals:   09/11/13 1055  BP: 146/72  Pulse: 58   the general appearance reveals a thin elderly woman in no acute distress.  Her weight is down 6 pounds since last visit.The head and neck exam reveals pupils equal and reactive.  Extraocular movements are full.  There is no scleral icterus.  The mouth and pharynx are normal.  The neck is supple.  The carotids reveal no bruits.  The jugular venous pressure is normal.  The  thyroid is not enlarged.  There is no lymphadenopathy.  The chest is clear to percussion and auscultation.  There are no rales or rhonchi.  Expansion of the chest is symmetrical.  The precordium is  quiet.  The first heart sound is normal.  The second heart sound is physiologically split.  There is no murmur gallop rub or click.  There is no abnormal lift or heave.  The abdomen is soft and nontender.  The bowel sounds are normal.  The liver and spleen are not enlarged.  There are no abdominal masses.  There are no abdominal bruits.  Extremities reveal good pedal pulses.  There is no phlebitis or edema.  There is no cyanosis or clubbing.  Strength reveals that she requires assistance in getting up to examining table.  There is no lateralizing weakness.  There are no sensory deficits.  The skin is warm and dry.  There is no rash.    Assessment / Plan: Continue same medication.  Trial of MiraLax for constipation.  Recheck here in 3 months for office visit and EKG.

## 2013-09-19 ENCOUNTER — Other Ambulatory Visit: Payer: Self-pay | Admitting: Cardiology

## 2013-09-19 ENCOUNTER — Other Ambulatory Visit: Payer: Self-pay | Admitting: Internal Medicine

## 2013-11-16 ENCOUNTER — Other Ambulatory Visit: Payer: Self-pay | Admitting: Cardiology

## 2013-11-16 ENCOUNTER — Other Ambulatory Visit: Payer: Self-pay | Admitting: Internal Medicine

## 2013-11-20 IMAGING — CR DG CHEST 2V
1 series · 1 of 1 positions shown · non-contrast
Comparison: 06/18/2012

CLINICAL DATA: Chest pain

CHEST - 2 VIEW

[view not recorded]
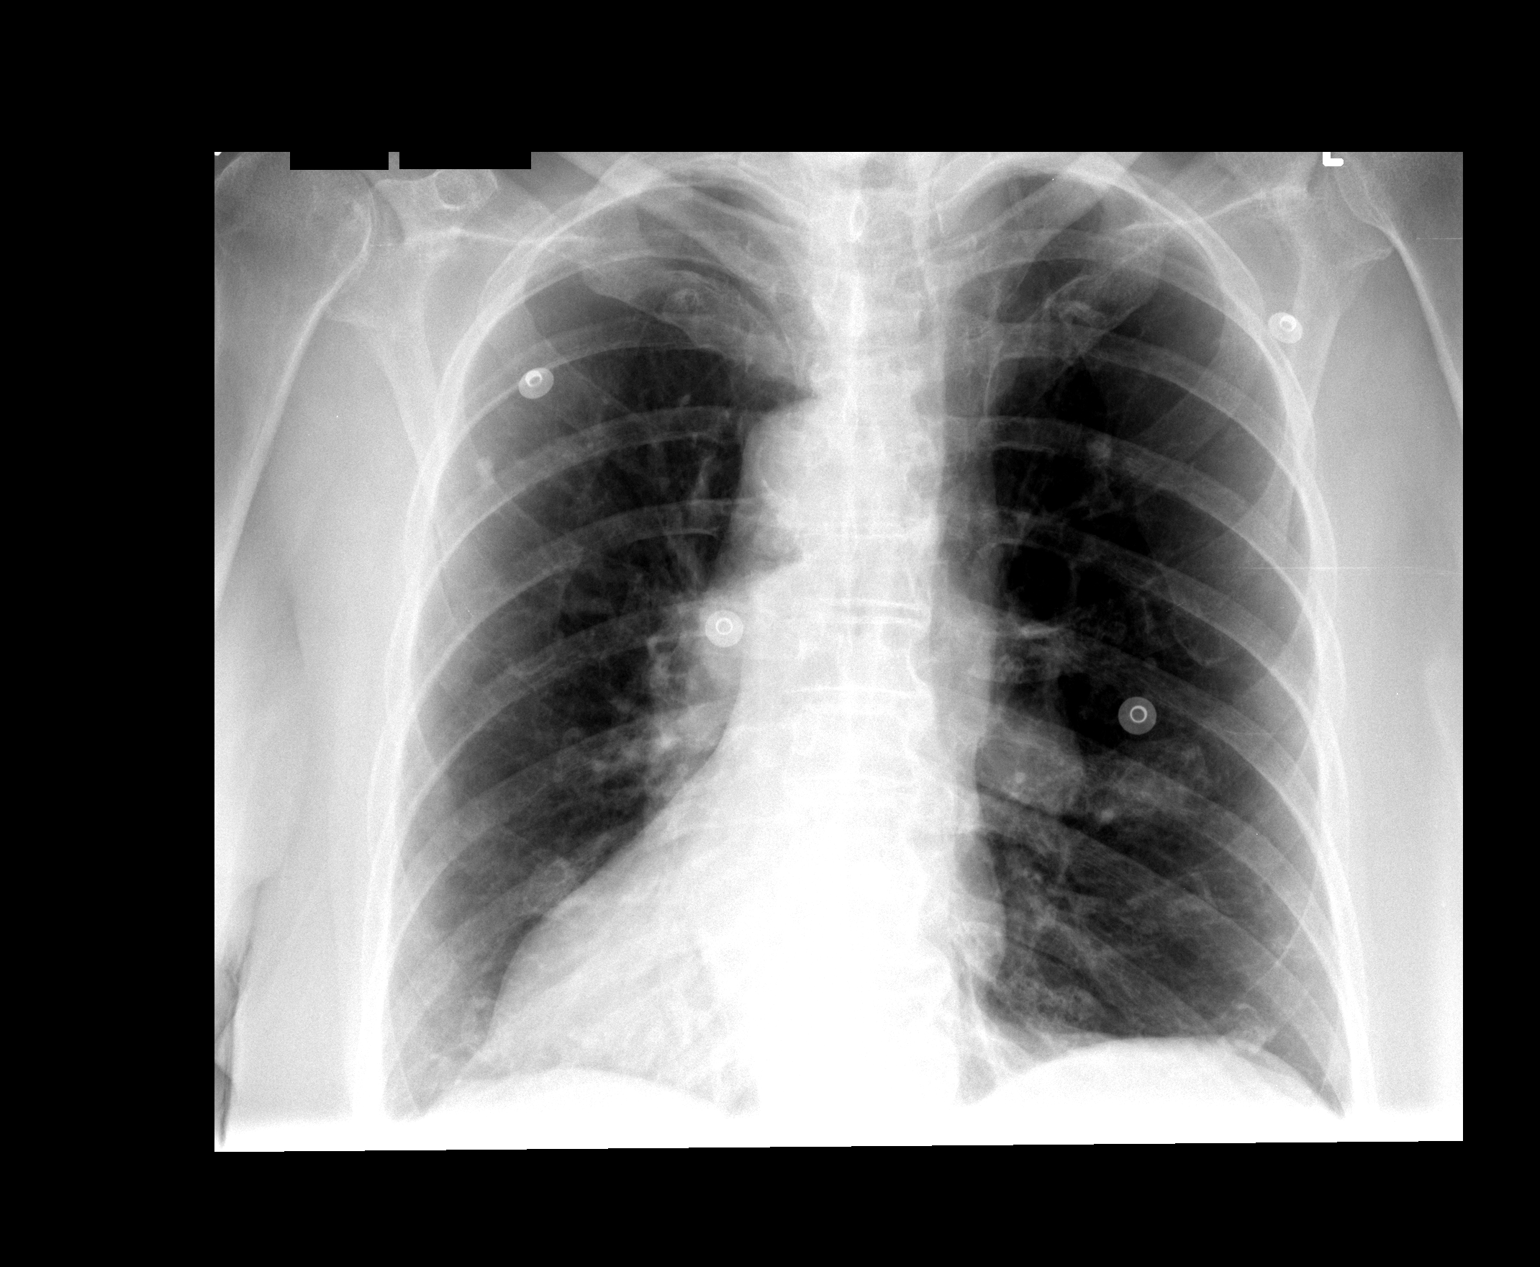

[1 of 1 positions shown; findings below may reference images not displayed]

FINDINGS: Upper normal heart size.  Lungs are hyperaerated.
Calcified granulomata in the right and left upper lobes. No new
consolidation or mass.  No pneumothorax or pleural effusion.
IMPRESSION: No active cardiopulmonary disease.

## 2013-11-21 ENCOUNTER — Ambulatory Visit (INDEPENDENT_AMBULATORY_CARE_PROVIDER_SITE_OTHER): Payer: 59 | Admitting: Family Medicine

## 2013-11-21 ENCOUNTER — Telehealth: Payer: Self-pay | Admitting: Cardiology

## 2013-11-21 ENCOUNTER — Ambulatory Visit: Payer: 59

## 2013-11-21 VITALS — BP 160/82 | HR 69 | Temp 97.2°F | Resp 20 | Ht 67.0 in | Wt 147.0 lb

## 2013-11-21 DIAGNOSIS — R0602 Shortness of breath: Secondary | ICD-10-CM

## 2013-11-21 DIAGNOSIS — J209 Acute bronchitis, unspecified: Secondary | ICD-10-CM

## 2013-11-21 LAB — POCT CBC
Granulocyte percent: 79.6 %G (ref 37–80)
HCT, POC: 41.1 % (ref 37.7–47.9)
Hemoglobin: 13.1 g/dL (ref 12.2–16.2)
Lymph, poc: 1.8 (ref 0.6–3.4)
MCH: 28.4 pg (ref 27–31.2)
MCHC: 31.9 g/dL (ref 31.8–35.4)
MCV: 88.9 fL (ref 80–97)
MID (CBC): 0.7 (ref 0–0.9)
MPV: 8 fL (ref 0–99.8)
PLATELET COUNT, POC: 411 10*3/uL (ref 142–424)
POC Granulocyte: 9.6 — AB (ref 2–6.9)
POC LYMPH %: 14.5 % (ref 10–50)
POC MID %: 5.9 %M (ref 0–12)
RBC: 4.62 M/uL (ref 4.04–5.48)
RDW, POC: 16.1 %
WBC: 12.1 10*3/uL — AB (ref 4.6–10.2)

## 2013-11-21 MED ORDER — AMOXICILLIN 875 MG PO TABS: 875.0000 mg | ORAL_TABLET | Freq: Two times a day (BID) | ORAL | Status: DC

## 2013-11-21 MED ORDER — ALBUTEROL SULFATE (2.5 MG/3ML) 0.083% IN NEBU
2.5000 mg | INHALATION_SOLUTION | Freq: Once | RESPIRATORY_TRACT | Status: AC
  Administered 2013-11-21: 2.5 mg via RESPIRATORY_TRACT

## 2013-11-21 NOTE — Progress Notes (Signed)
Subjective: Patient has been sick for a few days with a bad cold. She's had head congestion and coughing. She is always somewhat short of breath, but she's been short of breath. She has home oxygen which she wears every night, but she is wanted some in the day time. She has not been feverish. She was blowing stuff out of her head, and now is just coughing it up.  Objective: Coughing greenish yellow phlegm. Her chest is clear to auscultation except for minimal end expiratory wheeze. She was given a nebulizer treatment but did not change much. Her O2 saturation was 92% before and afterwards. She smoked many many years ago. Her TMs are not visualized as she is wearing bilateral hearing aids. Nose is drippy, clear. Throat clear. Neck supple without nodes. Heart regular without murmurs.  Assessment: Shortness of breath Acute bronchitis     Results for orders placed in visit on 11/21/13  POCT CBC      Result Value Ref Range   WBC 12.1 (*) 4.6 - 10.2 K/uL   Lymph, poc 1.8  0.6 - 3.4   POC LYMPH PERCENT 14.5  10 - 50 %L   MID (cbc) 0.7  0 - 0.9   POC MID % 5.9  0 - 12 %M   POC Granulocyte 9.6 (*) 2 - 6.9   Granulocyte percent 79.6  37 - 80 %G   RBC 4.62  4.04 - 5.48 M/uL   Hemoglobin 13.1  12.2 - 16.2 g/dL   HCT, POC 16.141.1  09.637.7 - 47.9 %   MCV 88.9  80 - 97 fL   MCH, POC 28.4  27 - 31.2 pg   MCHC 31.9  31.8 - 35.4 g/dL   RDW, POC 04.516.1     Platelet Count, POC 411  142 - 424 K/uL   MPV 8.0  0 - 99.8 fL   [facility] reading (PRIMARY) by  Dr. Alwyn RenHopper Normal CXR.  Plan: Amoxicillin 875 twice daily Cultured her yellow green mucus that she coughed into the bowl. Cautioned her to go to ER if in all worse and she would need to go into the hospital probably. Wear her home oxygen at nighttime. He

## 2013-11-21 NOTE — Telephone Encounter (Signed)
New problem   Pt's friend called to say pt said she is feeling very bad all over plus Laryngitis..  Pt would like to be seen today.

## 2013-11-21 NOTE — Telephone Encounter (Signed)
Spoke with patient and she was aware  Dr. Patty SermonsBrackbill out of the office, went to urgent care and received antibiotics

## 2013-11-21 NOTE — Patient Instructions (Signed)
Abruptly worse go to the emergency room or call 911 if needed.  Take the amoxicillin one twice daily  Drink plenty of fluids. Better hydrated UR the thinner the secretions will be.  Wear your oxygen

## 2013-11-24 LAB — WOUND CULTURE

## 2013-12-02 ENCOUNTER — Encounter (HOSPITAL_COMMUNITY): Payer: Self-pay | Admitting: Emergency Medicine

## 2013-12-02 ENCOUNTER — Emergency Department (HOSPITAL_COMMUNITY): Payer: Medicare Other

## 2013-12-02 ENCOUNTER — Emergency Department (HOSPITAL_COMMUNITY)
Admission: EM | Admit: 2013-12-02 | Discharge: 2013-12-02 | Disposition: A | Discharge status: Home / Self Care | Payer: Medicare Other | Attending: Emergency Medicine | Admitting: Emergency Medicine

## 2013-12-02 DIAGNOSIS — G20A1 Parkinson's disease without dyskinesia, without mention of fluctuations: Secondary | ICD-10-CM | POA: Insufficient documentation

## 2013-12-02 DIAGNOSIS — R55 Syncope and collapse: Secondary | ICD-10-CM | POA: Insufficient documentation

## 2013-12-02 DIAGNOSIS — Z862 Personal history of diseases of the blood and blood-forming organs and certain disorders involving the immune mechanism: Secondary | ICD-10-CM | POA: Insufficient documentation

## 2013-12-02 DIAGNOSIS — Z8639 Personal history of other endocrine, nutritional and metabolic disease: Secondary | ICD-10-CM | POA: Insufficient documentation

## 2013-12-02 DIAGNOSIS — Z7901 Long term (current) use of anticoagulants: Secondary | ICD-10-CM | POA: Insufficient documentation

## 2013-12-02 DIAGNOSIS — N184 Chronic kidney disease, stage 4 (severe): Secondary | ICD-10-CM | POA: Insufficient documentation

## 2013-12-02 DIAGNOSIS — Z79899 Other long term (current) drug therapy: Secondary | ICD-10-CM | POA: Insufficient documentation

## 2013-12-02 DIAGNOSIS — I129 Hypertensive chronic kidney disease with stage 1 through stage 4 chronic kidney disease, or unspecified chronic kidney disease: Secondary | ICD-10-CM | POA: Insufficient documentation

## 2013-12-02 DIAGNOSIS — Z792 Long term (current) use of antibiotics: Secondary | ICD-10-CM | POA: Insufficient documentation

## 2013-12-02 DIAGNOSIS — Z8719 Personal history of other diseases of the digestive system: Secondary | ICD-10-CM | POA: Insufficient documentation

## 2013-12-02 DIAGNOSIS — Z87828 Personal history of other (healed) physical injury and trauma: Secondary | ICD-10-CM | POA: Insufficient documentation

## 2013-12-02 DIAGNOSIS — G20 Parkinson's disease: Secondary | ICD-10-CM | POA: Insufficient documentation

## 2013-12-02 DIAGNOSIS — M542 Cervicalgia: Secondary | ICD-10-CM | POA: Insufficient documentation

## 2013-12-02 DIAGNOSIS — M129 Arthropathy, unspecified: Secondary | ICD-10-CM | POA: Insufficient documentation

## 2013-12-02 DIAGNOSIS — Z86711 Personal history of pulmonary embolism: Secondary | ICD-10-CM | POA: Insufficient documentation

## 2013-12-02 DIAGNOSIS — J439 Emphysema, unspecified: Secondary | ICD-10-CM

## 2013-12-02 DIAGNOSIS — Z9849 Cataract extraction status, unspecified eye: Secondary | ICD-10-CM | POA: Insufficient documentation

## 2013-12-02 DIAGNOSIS — J438 Other emphysema: Secondary | ICD-10-CM | POA: Insufficient documentation

## 2013-12-02 DIAGNOSIS — Z87891 Personal history of nicotine dependence: Secondary | ICD-10-CM | POA: Insufficient documentation

## 2013-12-02 LAB — COMPREHENSIVE METABOLIC PANEL
ALK PHOS: 80 U/L (ref 39–117)
ALT: 19 U/L (ref 0–35)
AST: 31 U/L (ref 0–37)
Albumin: 3.8 g/dL (ref 3.5–5.2)
BUN: 36 mg/dL — AB (ref 6–23)
CALCIUM: 9.5 mg/dL (ref 8.4–10.5)
CO2: 19 mEq/L (ref 19–32)
Chloride: 105 mEq/L (ref 96–112)
Creatinine, Ser: 1.99 mg/dL — ABNORMAL HIGH (ref 0.50–1.10)
GFR, EST AFRICAN AMERICAN: 25 mL/min — AB (ref >90)
GFR, EST NON AFRICAN AMERICAN: 21 mL/min — AB (ref >90)
GLUCOSE: 106 mg/dL — AB (ref 70–99)
Potassium: 4.3 mEq/L (ref 3.7–5.3)
Sodium: 142 mEq/L (ref 137–147)
Total Bilirubin: 0.4 mg/dL (ref 0.3–1.2)
Total Protein: 7.3 g/dL (ref 6.0–8.3)

## 2013-12-02 LAB — URINALYSIS, ROUTINE W REFLEX MICROSCOPIC
Bilirubin Urine: NEGATIVE
Glucose, UA: NEGATIVE mg/dL
HGB URINE DIPSTICK: NEGATIVE
KETONES UR: NEGATIVE mg/dL
Leukocytes, UA: NEGATIVE
Nitrite: NEGATIVE
PROTEIN: NEGATIVE mg/dL
Specific Gravity, Urine: 1.017 (ref 1.005–1.030)
UROBILINOGEN UA: 0.2 mg/dL (ref 0.0–1.0)
pH: 5.5 (ref 5.0–8.0)

## 2013-12-02 LAB — CBC WITH DIFFERENTIAL/PLATELET
BASOS ABS: 0 10*3/uL (ref 0.0–0.1)
BASOS PCT: 0 % (ref 0–1)
Eosinophils Absolute: 0.1 10*3/uL (ref 0.0–0.7)
Eosinophils Relative: 1 % (ref 0–5)
HCT: 40.7 % (ref 36.0–46.0)
Hemoglobin: 13.5 g/dL (ref 12.0–15.0)
Lymphocytes Relative: 7 % — ABNORMAL LOW (ref 12–46)
Lymphs Abs: 0.9 10*3/uL (ref 0.7–4.0)
MCH: 28.7 pg (ref 26.0–34.0)
MCHC: 33.2 g/dL (ref 30.0–36.0)
MCV: 86.4 fL (ref 78.0–100.0)
MONO ABS: 0.6 10*3/uL (ref 0.1–1.0)
Monocytes Relative: 5 % (ref 3–12)
NEUTROS PCT: 87 % — AB (ref 43–77)
Neutro Abs: 11 10*3/uL — ABNORMAL HIGH (ref 1.7–7.7)
Platelets: 286 10*3/uL (ref 150–400)
RBC: 4.71 MIL/uL (ref 3.87–5.11)
RDW: 15 % (ref 11.5–15.5)
WBC: 12.7 10*3/uL — ABNORMAL HIGH (ref 4.0–10.5)

## 2013-12-02 LAB — I-STAT TROPONIN, ED: TROPONIN I, POC: 0.02 ng/mL (ref 0.00–0.08)

## 2013-12-02 MED ORDER — SODIUM CHLORIDE 0.9 % IV BOLUS (SEPSIS): 500.0000 mL | Freq: Once | INTRAVENOUS | Status: DC

## 2013-12-02 MED ORDER — HYDRALAZINE HCL 25 MG PO TABS
25.0000 mg | ORAL_TABLET | Freq: Once | ORAL | Status: AC
  Administered 2013-12-02: 25 mg via ORAL
  Filled 2013-12-02: qty 1

## 2013-12-02 NOTE — Discharge Instructions (Signed)
Stay hydrated.   Recommend physical therapy to improve conditioning.   Recommend ensure supplement.   Should be assisted as needed for activities of daily living.   Follow up with your doctor.   Return to ER if you pass out, have chest pain, shortness of breath.

## 2013-12-02 NOTE — ED Notes (Addendum)
Pt attempted to void at bedside commode. Pt was unable to produce any urine but had a large bowel movement.

## 2013-12-02 NOTE — ED Notes (Signed)
Pt from [place], c/o fall w/ possible syncopal episode. Pt states she was making breakfastfelt dizzy, laid on ground. Denies pain. Pt alert at this time

## 2013-12-02 NOTE — ED Notes (Signed)
Placed call to [facility] for transportation back home 

## 2013-12-02 NOTE — ED Provider Notes (Signed)
CSN: [id number]     Arrival date & time 12/02/13  1022 History   First MD Initiated Contact with Patient 12/02/13 1022     Chief Complaint  Patient presents with  . Altered Mental Status     (Consider location/radiation/quality/duration/timing/severity/associated sxs/prior Treatment) The history is provided by the patient.  Nicole Harding is a 80 y.o. female hx of HTN, orthostatic hypotension, parkinson's here with syncope. She was making breakfast and felt light headed and dizzy and passed out. She notes urinating on herself as well. She laid on the ground and then crawled to the door and called for help. Possible head injury. Has some neck pain as well. She is almost done with amoxicillin for recent pneumonia. Not coughing.    Past Medical History  Diagnosis Date  . Hypertension   . History of orthostatic hypotension   . IBS (irritable bowel syndrome)   . Parkinson's disease     ATYPICAL  . Dizziness   . Vertigo   . Fatigue   . Advanced age   . HLD (hyperlipidemia)     intolerant to lipid lowering drugs  . Normal nuclear stress test 2003  . Fall at home 05/2012    mechanical fall  (06/01/2012)  . Pulmonary embolism 05/31/2012    off coumadin due to fall/SDH; has filter in place  . Chronic kidney disease, stage 4 (severe)   . Exertional dyspnea   . Arthritis     "legs, fingers" (06/01/2012)  . Subdural hematoma    Past Surgical History  Procedure Laterality Date  . Small intestine surgery    . Hemorrhoid surgery  1970    "have4 had accidents ever since" (06/01/2012)  . Abdominal hysterectomy  1970's  . Tonsillectomy  1954  . Cholecystectomy  1955?  Marland Kitchen Breast cyst excision  1970's    left  . Cataract extraction w/ intraocular lens  implant, bilateral  ? 1990's  . Shoulder open rotator cuff repair  1990?    right  . Appendectomy    . Joint replacement    . Eye surgery     Family History  Problem Relation Age of Onset  . Stroke Father   . Emphysema Brother      smoked  . Heart disease      siblings   History  Substance Use Topics  . Smoking status: Former Smoker -- 1.00 packs/day for 20 years    Types: Cigarettes    Quit date: 02/14/1971  . Smokeless tobacco: Never Used  . Alcohol Use: No   OB History   Grav Para Term Preterm Abortions TAB SAB Ect Mult Living                 Review of Systems  Unable to perform ROS: Mental status change  Neurological: Positive for syncope.  All other systems reviewed and are negative.     Allergies  Amlodipine; Codeine; Lipitor; Lisinopril; Prozac; Red yeast rice; Zetia; and Zoloft  Home Medications   Prior to Admission medications   Medication Sig Start Date End Date Taking? Authorizing Provider  amoxicillin (AMOXIL) 875 MG tablet Take 1 tablet (875 mg total) by mouth 2 (two) times daily. 11/21/13   Peyton Najjar, MD  citric acid-sodium citrate [facility]) 334-500 MG/5ML solution One tbsp daily 06/23/13   Nyoka Cowden, MD  famotidine (PEPCID) 20 MG tablet TAKE ONE TABLET AT BEDTIME    Nyoka Cowden, MD  hydrALAZINE (APRESOLINE) 25 MG tablet TAKE ONE TABLET FOUR  TIMES DAILY    [name], MD  isosorbide mononitrate (IMDUR) 30 MG 24 hr tablet TAKE ONE TABLET EVERY DAY 07/20/13   [name], MD  metoprolol succinate (TOPROL-XL) 25 MG 24 hr tablet TAKE ONE TABLET TWICE DAILY IF BLOOD PRESSURE GOES ABOVE 150 TAKE AN EXTRA PILL    [name], MD  Multiple Vitamins-Minerals (CENTRUM SILVER PO) Take 1 tablet by mouth daily.    Historical Provider, MD  Multiple Vitamins-Minerals (HAIR/SKIN/NAILS PO) Take 1 tablet by mouth daily.    Historical Provider, MD  pantoprazole (PROTONIX) 40 MG tablet Take 1 tablet (40 mg total) by mouth daily. Take 30-60 min before first meal of the day 06/21/13   Nyoka Cowden, MD  polyethylene glycol powder [facility]) powder Take 1 Container by mouth as directed. 17 G DAILY AS NEEDED    Historical Provider, MD   BP 197/77  Pulse 54  Temp(Src) 97.6 F  (36.4 C) (Oral)  Resp 16  SpO2 99% Physical Exam  Nursing note and vitals reviewed. Constitutional: She is oriented to person, place, and time.  Chronically ill, NAD   HENT:  Head: Normocephalic.  Mouth/Throat: Oropharynx is clear and moist.  No obvious scalp hematoma   Eyes: Conjunctivae and EOM are normal. Pupils are equal, round, and reactive to light.  Neck: Normal range of motion. Neck supple.  Cardiovascular: Normal rate, regular rhythm and normal heart sounds.   Pulmonary/Chest: Effort normal and breath sounds normal. No respiratory distress. She has no wheezes. She has no rales.  Abdominal: Soft. Bowel sounds are normal. She exhibits no distension. There is no tenderness. There is no rebound and no guarding.  Musculoskeletal: Normal range of motion. She exhibits no edema and no tenderness.  No obvious extremity trauma   Neurological: She is alert and oriented to person, place, and time. No cranial nerve deficit. Coordination normal.  Skin: Skin is warm and dry.  Psychiatric: She has a normal mood and affect. Her behavior is normal. Judgment and thought content normal.    ED Course  Procedures (including critical care time) Labs Review Labs Reviewed  CBC WITH DIFFERENTIAL - Abnormal; Notable for the following:    WBC 12.7 (*)    Neutrophils Relative % 87 (*)    Neutro Abs 11.0 (*)    Lymphocytes Relative 7 (*)    All other components within normal limits  COMPREHENSIVE METABOLIC PANEL - Abnormal; Notable for the following:    Glucose, Bld 106 (*)    BUN 36 (*)    Creatinine, Ser 1.99 (*)    GFR calc non Af Amer 21 (*)    GFR calc Af Amer 25 (*)    All other components within normal limits  URINALYSIS, ROUTINE W REFLEX MICROSCOPIC  I-STAT TROPOININ, ED    Imaging Review Dg Chest 2 View  12/02/2013   CLINICAL DATA:  Altered mental status. Occasional shortness of breath.  EXAM: CHEST  2 VIEW  COMPARISON:  11/21/2013  FINDINGS: Bilateral upper lobe calcified  granulomas. There is hyperinflation of the lungs compatible with COPD. Heart and mediastinal contours are within normal limits. No focal opacities or effusions. No acute bony abnormality. Degenerative changes in the thoracic spine.  IMPRESSION: COPD/chronic changes.  No acute findings.   Electronically Signed   By: Charlett Nose M.D.   On: 12/02/2013 12:38   Ct Head Wo Contrast  12/02/2013   CLINICAL DATA:  Syncope while making breakfast, fall, dizziness, neck pain, history hypertension, Parkinson's  EXAM: CT  HEAD WITHOUT CONTRAST  CT CERVICAL SPINE WITHOUT CONTRAST  TECHNIQUE: Multidetector CT imaging of the head and cervical spine was performed following the standard protocol without intravenous contrast. Multiplanar CT image reconstructions of the cervical spine were also generated.  COMPARISON:  CT head 07/18/2012  FINDINGS: CT HEAD FINDINGS  Generalized atrophy.  Normal ventricular morphology.  No midline shift or mass effect.  Small vessel chronic ischemic changes of deep cerebral white matter.  No intracranial hemorrhage, mass lesion, or acute infarction.  Visualized paranasal sinuses and mastoid air cells clear.  Bones unremarkable.  Atherosclerotic calcifications of the internal carotid arteries bilaterally at the skullbase.  CT CERVICAL SPINE FINDINGS  Osseous demineralization.  Visualized skullbase intact.  Multilevel facet degenerative changes throughout cervical spine.  Scattered disc space narrowing and endplate spur formation greatest at C3-C4 and C6-C7.  Minimal anterolisthesis at C4-C5 and C7-T1 likely degenerative.  Prevertebral soft tissues normal thickness.  Vertebral body heights maintained without fracture or bone destruction.  Lung apices clear.  Scattered atherosclerotic calcifications.  IMPRESSION: Atrophy with small vessel chronic ischemic changes of deep cerebral white matter.  No acute intracranial abnormalities.  Multilevel degenerative disc and facet disease changes of the cervical  spine.  No acute cervical spine abnormalities.   Electronically Signed   By: Ulyses SouthwardMark  Boles M.D.   On: 12/02/2013 12:17   Ct Cervical Spine Wo Contrast  12/02/2013   CLINICAL DATA:  Syncope while making breakfast, fall, dizziness, neck pain, history hypertension, Parkinson's  EXAM: CT HEAD WITHOUT CONTRAST  CT CERVICAL SPINE WITHOUT CONTRAST  TECHNIQUE: Multidetector CT imaging of the head and cervical spine was performed following the standard protocol without intravenous contrast. Multiplanar CT image reconstructions of the cervical spine were also generated.  COMPARISON:  CT head 07/18/2012  FINDINGS: CT HEAD FINDINGS  Generalized atrophy.  Normal ventricular morphology.  No midline shift or mass effect.  Small vessel chronic ischemic changes of deep cerebral white matter.  No intracranial hemorrhage, mass lesion, or acute infarction.  Visualized paranasal sinuses and mastoid air cells clear.  Bones unremarkable.  Atherosclerotic calcifications of the internal carotid arteries bilaterally at the skullbase.  CT CERVICAL SPINE FINDINGS  Osseous demineralization.  Visualized skullbase intact.  Multilevel facet degenerative changes throughout cervical spine.  Scattered disc space narrowing and endplate spur formation greatest at C3-C4 and C6-C7.  Minimal anterolisthesis at C4-C5 and C7-T1 likely degenerative.  Prevertebral soft tissues normal thickness.  Vertebral body heights maintained without fracture or bone destruction.  Lung apices clear.  Scattered atherosclerotic calcifications.  IMPRESSION: Atrophy with small vessel chronic ischemic changes of deep cerebral white matter.  No acute intracranial abnormalities.  Multilevel degenerative disc and facet disease changes of the cervical spine.  No acute cervical spine abnormalities.   Electronically Signed   By: Ulyses SouthwardMark  Boles M.D.   On: 12/02/2013 12:17     EKG Interpretation   Date/Time:  Monday December 02 2013 10:41:36 EDT Ventricular Rate:  61 PR Interval:   228 QRS Duration: 87 QT Interval:  444 QTC Calculation: 447 R Axis:   -21 Text Interpretation:  Sinus rhythm Ventricular premature complex Prolonged  PR interval RSR' in V1 or V2, probably normal variant Consider left  ventricular hypertrophy Anterior Q waves, possibly due to LVH No  significant change since last tracing Confirmed by YAO  MD, DAVID ([id number])  on 12/02/2013 10:53:34 AM      MDM   Final diagnoses:  None    Nigel BridgemanDorothy M Harding is a 80 y.o.  female here with possible syncope. Will get ct head/neck. Will get cxr given recent pneumonia. Will do orthostatics and get EKG. Will reassess.   2:44 PM Not orthostatic. CT head and neck unremarkable. CXR unremarkable. Able to ambulate. Likely mild dehydration. Hydrated in the ED. BP elevated but didn't take AM meds so I gave her PO meds and BP improved. Stable to d/c.   Richardean Canalavid H Yao, MD 12/02/13 [phone]

## 2013-12-16 ENCOUNTER — Other Ambulatory Visit: Payer: Self-pay | Admitting: Cardiology

## 2014-01-05 ENCOUNTER — Ambulatory Visit (INDEPENDENT_AMBULATORY_CARE_PROVIDER_SITE_OTHER): Payer: Medicare Other | Admitting: Emergency Medicine

## 2014-01-05 ENCOUNTER — Other Ambulatory Visit: Payer: Self-pay | Admitting: *Deleted

## 2014-01-05 VITALS — BP 140/70 | HR 79 | Temp 97.7°F | Resp 16 | Ht 65.5 in | Wt 140.2 lb

## 2014-01-05 DIAGNOSIS — J209 Acute bronchitis, unspecified: Secondary | ICD-10-CM

## 2014-01-05 MED ORDER — AZITHROMYCIN 250 MG PO TABS: ORAL_TABLET | ORAL | Status: DC

## 2014-01-05 NOTE — Progress Notes (Signed)
[facility] [address] [place] [address] [id number]  Date:  01/05/2014   Name:  Nicole Harding   DOB:  1923-10-29   MRN:  [id number]  PCP:  [name], MD    Chief Complaint: Cough   History of Present Illness:  Nicole Harding is a 80 y.o. very pleasant female patient who presents with the following:  Ill for the past week with a "cold" now has cough productive thick, purulent sputum.  Has shortness of breath at rest and wheezing.  No fever or chills.  No nausea or vomiting.  Weak and tired.  Appetite less.  No improvement with over the counter medications or other home remedies. Denies other complaint or health concern today.   Patient Active Problem List   Diagnosis Date Noted  . Dyspnea 07/18/2013  . GERD (gastroesophageal reflux disease) 06/23/2013  . Chronic respiratory failure 06/23/2013  . Emphysema 06/11/2013  . Cold intolerance 06/11/2013  . Ataxia 04/12/2013  . Depression 03/06/2013  . History of pulmonary embolism 06/19/2012  . Warfarin anticoagulation 06/19/2012  . UTI (lower urinary tract infection) due to pseudomonas 06/19/2012  . SDH (subdural hematoma) 06/18/2012  . Metabolic encephalopathy 06/18/2012  . Sepsis due to Pseudomonas 06/18/2012  . Cervical myelopathy 06/07/2012  . Rigors 06/05/2012  . Pulmonary embolism 06/01/2012  . UTI (urinary tract infection) 06/01/2012  . Pain in left shoulder  06/01/2012  . Nausea 06/01/2012  . CKD (chronic kidney disease) stage 4, GFR 15-29 ml/min 06/01/2012  . HTN (hypertension) 06/01/2012  . Accelerated hypertension 06/01/2012  . Fall-recurrent 06/01/2012  . Benign hypertensive heart disease without heart failure 12/13/2011  . Dizziness - light-headed 12/13/2011  . Chronic renal insufficiency, stage III (moderate) 12/13/2011  . Pure hypercholesterolemia 09/26/2011  . Vertigo   . History of orthostatic hypotension 02/21/2011  . Irritable bowel syndrome 02/21/2011  .  Postmenopausal state 02/21/2011    Past Medical History  Diagnosis Date  . Hypertension   . History of orthostatic hypotension   . IBS (irritable bowel syndrome)   . Parkinson's disease     ATYPICAL  . Dizziness   . Vertigo   . Fatigue   . Advanced age   . HLD (hyperlipidemia)     intolerant to lipid lowering drugs  . Normal nuclear stress test 2003  . Fall at home 05/2012    mechanical fall  (06/01/2012)  . Pulmonary embolism 05/31/2012    off coumadin due to fall/SDH; has filter in place  . Chronic kidney disease, stage 4 (severe)   . Exertional dyspnea   . Arthritis     "legs, fingers" (06/01/2012)  . Subdural hematoma     Past Surgical History  Procedure Laterality Date  . Small intestine surgery    . Hemorrhoid surgery  1970    "have4 had accidents ever since" (06/01/2012)  . Abdominal hysterectomy  1970's  . Tonsillectomy  1954  . Cholecystectomy  1955?  Marland Kitchen Breast cyst excision  1970's    left  . Cataract extraction w/ intraocular lens  implant, bilateral  ? 1990's  . Shoulder open rotator cuff repair  1990?    right  . Appendectomy    . Joint replacement    . Eye surgery      History  Substance Use Topics  . Smoking status: Former Smoker -- 1.00 packs/day for 20 years    Types: Cigarettes    Quit date: 02/14/1971  . Smokeless tobacco: Never Used  .  Alcohol Use: No    Family History  Problem Relation Age of Onset  . Stroke Father   . Emphysema Brother     smoked  . Heart disease      siblings    Allergies  Allergen Reactions  . Amlodipine Nausea And Vomiting and Swelling    Swelling --sick and ended up in hospital N&V  . Codeine Nausea And Vomiting  . Lipitor [Atorvastatin Calcium] Other (See Comments)    "cramps my legs"  . Lisinopril Other (See Comments)    unknown  . Prozac [Fluoxetine Hcl] Nausea And Vomiting    syncope  . Red Yeast Rice Other (See Comments)    unknown  . Zetia [Ezetimibe] Other (See Comments)    unknown  .  Zoloft [Sertraline Hcl]     Extreme weakness    Medication list has been reviewed and updated.  Current Outpatient Prescriptions on File Prior to Visit  Medication Sig Dispense Refill  . citric acid-sodium citrate (ORACIT) 334-500 MG/5ML solution Take 15 mLs by mouth daily.      . famotidine (PEPCID) 20 MG tablet Take 20 mg by mouth daily.      . hydrALAZINE (APRESOLINE) 25 MG tablet Take 25 mg by mouth 4 (four) times daily.      . isosorbide mononitrate (IMDUR) 30 MG 24 hr tablet TAKE ONE TABLET EACH DAY  30 tablet  0  . metoprolol succinate (TOPROL-XL) 25 MG 24 hr tablet Take 25 mg by mouth See admin instructions. Take 1 tablet by mouth twice daily, as scheduled. If blood pressure exceeds 150, take additional tablet      . Multiple Vitamins-Minerals (CENTRUM SILVER PO) Take 1 tablet by mouth daily.      . Multiple Vitamins-Minerals (HAIR/SKIN/NAILS PO) Take 1 tablet by mouth daily.      . pantoprazole (PROTONIX) 40 MG tablet Take 1 tablet (40 mg total) by mouth daily. Take 30-60 min before first meal of the day  30 tablet  2  . polyethylene glycol powder (MIRALAX) powder Take 17 g by mouth daily as needed for mild constipation.        No current facility-administered medications on file prior to visit.    Review of Systems:  As per HPI, otherwise negative.    Physical Examination: Filed Vitals:   01/05/14 1401  BP: 140/70  Pulse: 79  Temp: 97.7 F (36.5 C)  Resp: 16   Filed Vitals:   01/05/14 1401  Height: 5' 5.5" (1.664 m)  Weight: 140 lb 3.2 oz (63.594 kg)   Body mass index is 22.97 kg/(m^2). Ideal Body Weight: Weight in (lb) to have BMI = 25: 152.2  GEN: WDWN, moderate distress, Non-toxic, A & O x 3 marked persistent cough HEENT: Atraumatic, Normocephalic. Neck supple. No masses, No LAD. Ears and Nose: No external deformity. CV: RRR, No M/G/R. No JVD. No thrill. No extra heart sounds. PULM: CTA B, no wheezes, crackles, rhonchi. No retractions. No resp. distress. No  accessory muscle use. ABD: S, NT, ND, +BS. No rebound. No HSM. EXTR: No c/c/e NEURO Normal gait.  PSYCH: Normally interactive. Conversant. Not depressed or anxious appearing.  Calm demeanor.    Assessment and Plan: Tolerating well, SaO2, pulse, bp.  Not breathless Bronchitis Sinusitis zpak  Robitussin DM   Signed,  Phillips OdorJeffery Lauraine Crespo, MD

## 2014-01-14 ENCOUNTER — Other Ambulatory Visit: Payer: Self-pay | Admitting: Internal Medicine

## 2014-01-14 ENCOUNTER — Other Ambulatory Visit: Payer: Self-pay | Admitting: Cardiology

## 2014-02-12 ENCOUNTER — Ambulatory Visit (INDEPENDENT_AMBULATORY_CARE_PROVIDER_SITE_OTHER): Payer: Medicare Other | Admitting: Family Medicine

## 2014-02-12 VITALS — BP 178/82 | HR 69 | Temp 97.9°F | Resp 16 | Ht 66.0 in | Wt 144.0 lb

## 2014-02-12 DIAGNOSIS — N390 Urinary tract infection, site not specified: Secondary | ICD-10-CM

## 2014-02-12 DIAGNOSIS — N189 Chronic kidney disease, unspecified: Secondary | ICD-10-CM

## 2014-02-12 DIAGNOSIS — R350 Frequency of micturition: Secondary | ICD-10-CM

## 2014-02-12 DIAGNOSIS — J9610 Chronic respiratory failure, unspecified whether with hypoxia or hypercapnia: Secondary | ICD-10-CM

## 2014-02-12 DIAGNOSIS — I10 Essential (primary) hypertension: Secondary | ICD-10-CM

## 2014-02-12 DIAGNOSIS — J9611 Chronic respiratory failure with hypoxia: Secondary | ICD-10-CM

## 2014-02-12 DIAGNOSIS — R0902 Hypoxemia: Secondary | ICD-10-CM

## 2014-02-12 LAB — POCT UA - MICROSCOPIC ONLY
Casts, Ur, LPF, POC: NEGATIVE
Crystals, Ur, HPF, POC: NEGATIVE
Mucus, UA: NEGATIVE
RBC, urine, microscopic: NEGATIVE
Yeast, UA: NEGATIVE

## 2014-02-12 LAB — POCT URINALYSIS DIPSTICK
Bilirubin, UA: NEGATIVE
Blood, UA: NEGATIVE
Glucose, UA: NEGATIVE
Ketones, UA: NEGATIVE
Nitrite, UA: NEGATIVE
Protein, UA: 30
Spec Grav, UA: 1.01
Urobilinogen, UA: 0.2
pH, UA: 5

## 2014-02-12 MED ORDER — CEPHALEXIN 500 MG PO CAPS: 500.0000 mg | ORAL_CAPSULE | Freq: Two times a day (BID) | ORAL | Status: DC

## 2014-02-12 NOTE — Progress Notes (Signed)
Subjective:    Patient ID: Nicole Harding, female    DOB: 12/10/1923, 80 y.o.   MRN: [id number]  HPI Nicole Harding is a 80 y.o. female [id number] Brackbill, MD  Hx of multiple medical problems as below, including chronic respiratory failure.  Seen by Dr. Minna Merritts in 07/2013. Unknown cause of dyspnea: V/q 03/02/13 low prob, spirometry wnl 12/11/20144 = 19 so increase to bid - Not clear why she desats but did not meet criteria for copd or for that matter any form of airflow obstruction.   - COPD, on oxygen at home at night usually, but sometimes during the day with activity. Short of breath with activity.  Arranging primary care provider in August. repeat o2 sat in room 92-94% on recheck.  No new chest pains, or shortness of breath- same as usual.   HTN - no  Known missed doses of meds.  No chest pains, dyspnea, HA or other new sx's. May have missed dose, but not sure.   Hx of CKD - most recent eGFR 21.    Here today d/t concern over possible bladder infection. Urine has been more cloudy. Past 2-3 weeks. Minimal burning at times and frequency. No fever.  Sore in muscles of back with exercise machine, also some soreness along muscles in front of chest wall after using machine. No lower abd/pelvic pain. Feels like urinary symptoms have improved, but still a little cloudy.  Tx: azo x2 Monday.  S/p Zpak on 01/05/14.    Patient Active Problem List   Diagnosis Date Noted  . Dyspnea 07/18/2013  . GERD (gastroesophageal reflux disease) 06/23/2013  . Chronic respiratory failure 06/23/2013  . Emphysema 06/11/2013  . Cold intolerance 06/11/2013  . Ataxia 04/12/2013  . Depression 03/06/2013  . History of pulmonary embolism 06/19/2012  . Warfarin anticoagulation 06/19/2012  . UTI (lower urinary tract infection) due to pseudomonas 06/19/2012  . SDH (subdural hematoma) 06/18/2012  . Metabolic encephalopathy 72/53/6644  . Sepsis due to Pseudomonas 06/18/2012  . Cervical myelopathy  06/07/2012  . Rigors 06/05/2012  . Pulmonary embolism 06/01/2012  . UTI (urinary tract infection) 06/01/2012  . Pain in left shoulder  06/01/2012  . Nausea 06/01/2012  . CKD (chronic kidney disease) stage 4, GFR 15-29 ml/min 06/01/2012  . HTN (hypertension) 06/01/2012  . Accelerated hypertension 06/01/2012  . Fall-recurrent 06/01/2012  . Benign hypertensive heart disease without heart failure 12/13/2011  . Dizziness - light-headed 12/13/2011  . Chronic renal insufficiency, stage III (moderate) 12/13/2011  . Pure hypercholesterolemia 09/26/2011  . Vertigo   . History of orthostatic hypotension 02/21/2011  . Irritable bowel syndrome 02/21/2011  . Postmenopausal state 02/21/2011   Past Medical History  Diagnosis Date  . Hypertension   . History of orthostatic hypotension   . IBS (irritable bowel syndrome)   . Parkinson's disease     ATYPICAL  . Dizziness   . Vertigo   . Fatigue   . Advanced age   . HLD (hyperlipidemia)     intolerant to lipid lowering drugs  . Normal nuclear stress test 2003  . Fall at home 05/2012    mechanical fall  (06/01/2012)  . Pulmonary embolism 05/31/2012    off coumadin due to fall/SDH; has filter in place  . Chronic kidney disease, stage 4 (severe)   . Exertional dyspnea   . Arthritis     "legs, fingers" (06/01/2012)  . Subdural hematoma    Past Surgical History  Procedure Laterality Date  . Small intestine surgery    .  Hemorrhoid surgery  1970    "have4 had accidents ever since" (06/01/2012)  . Abdominal hysterectomy  1970's  . Tonsillectomy  1954  . Cholecystectomy  1955?  Marland Kitchen Breast cyst excision  1970's    left  . Cataract extraction w/ intraocular lens  implant, bilateral  ? 1990's  . Shoulder open rotator cuff repair  1990?    right  . Appendectomy    . Joint replacement    . Eye surgery     Allergies  Allergen Reactions  . Amlodipine Nausea And Vomiting and Swelling    Swelling --sick and ended up in hospital N&V  . Codeine  Nausea And Vomiting  . Lipitor [Atorvastatin Calcium] Other (See Comments)    "cramps my legs"  . Lisinopril Other (See Comments)    unknown  . Prozac [Fluoxetine Hcl] Nausea And Vomiting    syncope  . Red Yeast Rice Other (See Comments)    unknown  . Zetia [Ezetimibe] Other (See Comments)    unknown  . Zoloft [Sertraline Hcl]     Extreme weakness   Prior to Admission medications   Medication Sig Start Date End Date Taking? Authorizing Provider  azithromycin (ZITHROMAX) 250 MG tablet Take 2 tabs PO x 1 dose, then 1 tab PO QD x 4 days 01/05/14  Yes Ellison Carwin, MD  citric acid-sodium citrate (ORACIT) 334-500 MG/5ML solution Take 15 mLs by mouth daily.   Yes Historical Provider, MD  famotidine (PEPCID) 20 MG tablet Take 20 mg by mouth daily.   Yes Historical Provider, MD  famotidine (PEPCID) 20 MG tablet TAKE ONE TABLET AT BEDTIME 01/14/14  Yes Tanda Rockers, MD  hydrALAZINE (APRESOLINE) 25 MG tablet Take 25 mg by mouth 4 (four) times daily.   Yes Historical Provider, MD  isosorbide mononitrate (IMDUR) 30 MG 24 hr tablet TAKE ONE TABLET EACH DAY   Yes Darlin Coco, MD  metoprolol succinate (TOPROL-XL) 25 MG 24 hr tablet Take 25 mg by mouth See admin instructions. Take 1 tablet by mouth twice daily, as scheduled. If blood pressure exceeds 150, take additional tablet   Yes Historical Provider, MD  metoprolol succinate (TOPROL-XL) 25 MG 24 hr tablet TAKE ONE TABLET TWICE DAILY -IF BLOOD PRESSURE GOES ABOVE 150-TAKE ANEXTRA PILL   Yes Darlin Coco, MD  Multiple Vitamins-Minerals (CENTRUM SILVER PO) Take 1 tablet by mouth daily.   Yes Historical Provider, MD  Multiple Vitamins-Minerals (HAIR/SKIN/NAILS PO) Take 1 tablet by mouth daily.   Yes Historical Provider, MD  pantoprazole (PROTONIX) 40 MG tablet Take 1 tablet (40 mg total) by mouth daily. Take 30-60 min before first meal of the day 06/21/13  Yes Tanda Rockers, MD  polyethylene glycol powder (MIRALAX) powder Take 17 g by mouth  daily as needed for mild constipation.    Yes Historical Provider, MD   History   Social History  . Marital Status: Widowed    Spouse Name: N/A    Number of Children: N/A  . Years of Education: N/A   Occupational History  . Retired    Social History Main Topics  . Smoking status: Former Smoker -- 1.00 packs/day for 20 years    Types: Cigarettes    Quit date: 02/14/1971  . Smokeless tobacco: Never Used  . Alcohol Use: No  . Drug Use: No  . Sexual Activity: No   Other Topics Concern  . Not on file   Social History Narrative  . No narrative on file  Review of Systems  Respiratory: Negative for shortness of breath (no change from baseline. ).   Cardiovascular: Negative for chest pain.  Gastrointestinal: Negative for abdominal pain (upper R side along chst wall/muscles).  Genitourinary: Positive for dysuria. Negative for decreased urine volume, difficulty urinating and pelvic pain.  Psychiatric/Behavioral: Negative for confusion and agitation.   As above in HPI.     Objective:   Physical Exam  Vitals reviewed. Constitutional: She is oriented to person, place, and time. She appears well-developed and well-nourished.  HENT:  Head: Normocephalic and atraumatic.  Cardiovascular: Normal rate, regular rhythm, normal heart sounds and intact distal pulses.   Pulmonary/Chest: Effort normal and breath sounds normal. No respiratory distress. She has no wheezes. She has no rales.  Abdominal: Soft. Normal appearance. She exhibits no distension. There is no tenderness (R lower anterolateral chest wall and lat muscle area. ). There is no rebound, no guarding and no CVA tenderness.  Neurological: She is alert and oriented to person, place, and time.  Skin: Skin is warm and dry.  Psychiatric: She has a normal mood and affect. Her behavior is normal.     Filed Vitals:   02/12/14 1222 02/12/14 1351  BP: 200/98 178/82  Pulse: 69   Temp: 97.9 F (36.6 C)   TempSrc: Oral     Resp: 16   Height: '5\' 6"'  (1.676 m)   Weight: 144 lb (65.318 kg)   SpO2: 84% 94%     Results for orders placed in visit on 02/12/14  POCT URINALYSIS DIPSTICK      Result Value Ref Range   Color, UA yellow     Clarity, UA cloudy     Glucose, UA neg     Bilirubin, UA neg     Ketones, UA neg     Spec Grav, UA 1.010     Blood, UA neg     pH, UA 5.0     Protein, UA 30     Urobilinogen, UA 0.2     Nitrite, UA neg     Leukocytes, UA large (3+)    POCT UA - MICROSCOPIC ONLY      Result Value Ref Range   WBC, Ur, HPF, POC tntc     RBC, urine, microscopic neg     Bacteria, U Microscopic 4+     Mucus, UA neg     Epithelial cells, urine per micros 0-3     Crystals, Ur, HPF, POC neg     Casts, Ur, LPF, POC neg     Yeast, UA neg         Assessment & Plan:   Nicole Harding is a 80 y.o. female Urine frequency - Plan: POCT urinalysis dipstick, POCT UA - Microscopic Only, UTI (lower urinary tract infection) - Plan: cephALEXin (KEFLEX) 500 MG capsule, Urine culture  -start keflex (renal dosing is Q8-12 hours for her most recent eGFR).  573m BID. Urine cx. Rtc/er precautions.   Essential hypertension - possible missed dose as prior readings ok. Check home BPs. Rtc/er precautions.   CKD (chronic kidney disease), unspecified stage -as above for renal dosing of ABX.   Chronic respiratory failure with hypoxia - suspect initial false reading with nail polish as when read proximal to polish - level ok. Has home O2 if needed.    Meds ordered this encounter  Medications  . cephALEXin (KEFLEX) 500 MG capsule    Sig: Take 1 capsule (500 mg total) by mouth 2 (two) times daily.  Dispense:  20 capsule    Refill:  0   Patient Instructions  Start antibiotic, You should receive a call or letter about your lab results within the next week to 10 days.  Keep a record of your blood pressures outside of the office and remain elevated - return for recheck.  If soreness on front of chest wall  not improving in next 4-5 days, or any increased shortness of breath or chest pain sooner - return here or go to the emergency room.  Urinary Tract Infection Urinary tract infections (UTIs) can develop anywhere along your urinary tract. Your urinary tract is your body's drainage system for removing wastes and extra water. Your urinary tract includes two kidneys, two ureters, a bladder, and a urethra. Your kidneys are a pair of bean-shaped organs. Each kidney is about the size of your fist. They are located below your ribs, one on each side of your spine. CAUSES Infections are caused by microbes, which are microscopic organisms, including fungi, viruses, and bacteria. These organisms are so small that they can only be seen through a microscope. Bacteria are the microbes that most commonly cause UTIs. SYMPTOMS  Symptoms of UTIs may vary by age and gender of the patient and by the location of the infection. Symptoms in young women typically include a frequent and intense urge to urinate and a painful, burning feeling in the bladder or urethra during urination. Older women and men are more likely to be tired, shaky, and weak and have muscle aches and abdominal pain. A fever may mean the infection is in your kidneys. Other symptoms of a kidney infection include pain in your back or sides below the ribs, nausea, and vomiting. DIAGNOSIS To diagnose a UTI, your caregiver will ask you about your symptoms. Your caregiver also will ask to provide a urine sample. The urine sample will be tested for bacteria and white blood cells. White blood cells are made by your body to help fight infection. TREATMENT  Typically, UTIs can be treated with medication. Because most UTIs are caused by a bacterial infection, they usually can be treated with the use of antibiotics. The choice of antibiotic and length of treatment depend on your symptoms and the type of bacteria causing your infection. HOME CARE INSTRUCTIONS  If you  were prescribed antibiotics, take them exactly as your caregiver instructs you. Finish the medication even if you feel better after you have only taken some of the medication.  Drink enough water and fluids to keep your urine clear or pale yellow.  Avoid caffeine, tea, and carbonated beverages. They tend to irritate your bladder.  Empty your bladder often. Avoid holding urine for long periods of time.  Empty your bladder before and after sexual intercourse.  After a bowel movement, women should cleanse from front to back. Use each tissue only once. SEEK MEDICAL CARE IF:   You have back pain.  You develop a fever.  Your symptoms do not begin to resolve within 3 days. SEEK IMMEDIATE MEDICAL CARE IF:   You have severe back pain or lower abdominal pain.  You develop chills.  You have nausea or vomiting.  You have continued burning or discomfort with urination. MAKE SURE YOU:   Understand these instructions.  Will watch your condition.  Will get help right away if you are not doing well or get worse. Document Released: 05/04/2005 Document Revised: 01/24/2012 Document Reviewed: 09/02/2011 [facility] Patient Information 2015 [place], [place]. This information is not intended to replace  advice given to you by your health care provider. Make sure you discuss any questions you have with your health care provider.

## 2014-02-12 NOTE — Patient Instructions (Signed)
Start antibiotic, You should receive a call or letter about your lab results within the next week to 10 days.  Keep a record of your blood pressures outside of the office and remain elevated - return for recheck.  If soreness on front of chest wall not improving in next 4-5 days, or any increased shortness of breath or chest pain sooner - return here or go to the emergency room.  Urinary Tract Infection Urinary tract infections (UTIs) can develop anywhere along your urinary tract. Your urinary tract is your body's drainage system for removing wastes and extra water. Your urinary tract includes two kidneys, two ureters, a bladder, and a urethra. Your kidneys are a pair of bean-shaped organs. Each kidney is about the size of your fist. They are located below your ribs, one on each side of your spine. CAUSES Infections are caused by microbes, which are microscopic organisms, including fungi, viruses, and bacteria. These organisms are so small that they can only be seen through a microscope. Bacteria are the microbes that most commonly cause UTIs. SYMPTOMS  Symptoms of UTIs may vary by age and gender of the patient and by the location of the infection. Symptoms in young women typically include a frequent and intense urge to urinate and a painful, burning feeling in the bladder or urethra during urination. Older women and men are more likely to be tired, shaky, and weak and have muscle aches and abdominal pain. A fever may mean the infection is in your kidneys. Other symptoms of a kidney infection include pain in your back or sides below the ribs, nausea, and vomiting. DIAGNOSIS To diagnose a UTI, your caregiver will ask you about your symptoms. Your caregiver also will ask to provide a urine sample. The urine sample will be tested for bacteria and white blood cells. White blood cells are made by your body to help fight infection. TREATMENT  Typically, UTIs can be treated with medication. Because most UTIs  are caused by a bacterial infection, they usually can be treated with the use of antibiotics. The choice of antibiotic and length of treatment depend on your symptoms and the type of bacteria causing your infection. HOME CARE INSTRUCTIONS  If you were prescribed antibiotics, take them exactly as your caregiver instructs you. Finish the medication even if you feel better after you have only taken some of the medication.  Drink enough water and fluids to keep your urine clear or pale yellow.  Avoid caffeine, tea, and carbonated beverages. They tend to irritate your bladder.  Empty your bladder often. Avoid holding urine for long periods of time.  Empty your bladder before and after sexual intercourse.  After a bowel movement, women should cleanse from front to back. Use each tissue only once. SEEK MEDICAL CARE IF:   You have back pain.  You develop a fever.  Your symptoms do not begin to resolve within 3 days. SEEK IMMEDIATE MEDICAL CARE IF:   You have severe back pain or lower abdominal pain.  You develop chills.  You have nausea or vomiting.  You have continued burning or discomfort with urination. MAKE SURE YOU:   Understand these instructions.  Will watch your condition.  Will get help right away if you are not doing well or get worse. Document Released: 05/04/2005 Document Revised: 01/24/2012 Document Reviewed: 09/02/2011 [facility] Patient Information 2015 [place]. This information is not intended to replace advice given to you by your health care provider. Make sure you discuss any questions you  have with your health care provider.  

## 2014-02-16 LAB — URINE CULTURE: Colony Count: >=100000

## 2014-02-18 ENCOUNTER — Other Ambulatory Visit: Payer: Self-pay | Admitting: Cardiology

## 2014-02-20 NOTE — Telephone Encounter (Signed)
Close Encounter 

## 2014-03-28 ENCOUNTER — Telehealth: Payer: Self-pay | Admitting: Cardiology

## 2014-03-28 NOTE — Telephone Encounter (Signed)
New problem   FYI:  Pt stated she is now seeing another physician and don't need to see Dr Patty SermonsBrackbill anymore because she isn't having any heart issues. She wanted to thank Dr Patty SermonsBrackbill for all he has done, but please take her name off the list.

## 2014-03-31 NOTE — Telephone Encounter (Signed)
Will forward to  Dr. Brackbill for review 

## 2014-03-31 NOTE — Telephone Encounter (Signed)
Thanks

## 2014-04-01 ENCOUNTER — Ambulatory Visit: Payer: Medicare Other | Admitting: Cardiology

## 2014-05-20 ENCOUNTER — Other Ambulatory Visit: Payer: Self-pay | Admitting: Cardiology

## 2014-06-20 ENCOUNTER — Inpatient Hospital Stay (HOSPITAL_COMMUNITY)
Admission: EM | Admit: 2014-06-20 | Discharge: 2014-06-25 | DRG: 683 | Disposition: A | Discharge status: Skilled Nursing Facility | Payer: Medicare Other | Attending: Internal Medicine | Admitting: Internal Medicine

## 2014-06-20 ENCOUNTER — Encounter (HOSPITAL_COMMUNITY): Payer: Self-pay

## 2014-06-20 ENCOUNTER — Emergency Department (HOSPITAL_COMMUNITY): Payer: Medicare Other

## 2014-06-20 DIAGNOSIS — R55 Syncope and collapse: Secondary | ICD-10-CM | POA: Diagnosis present

## 2014-06-20 DIAGNOSIS — N184 Chronic kidney disease, stage 4 (severe): Secondary | ICD-10-CM | POA: Diagnosis present

## 2014-06-20 DIAGNOSIS — M25512 Pain in left shoulder: Secondary | ICD-10-CM | POA: Diagnosis present

## 2014-06-20 DIAGNOSIS — I272 Other secondary pulmonary hypertension: Secondary | ICD-10-CM | POA: Diagnosis present

## 2014-06-20 DIAGNOSIS — J449 Chronic obstructive pulmonary disease, unspecified: Secondary | ICD-10-CM | POA: Diagnosis present

## 2014-06-20 DIAGNOSIS — K580 Irritable bowel syndrome with diarrhea: Secondary | ICD-10-CM | POA: Diagnosis present

## 2014-06-20 DIAGNOSIS — E785 Hyperlipidemia, unspecified: Secondary | ICD-10-CM | POA: Diagnosis present

## 2014-06-20 DIAGNOSIS — J9611 Chronic respiratory failure with hypoxia: Secondary | ICD-10-CM | POA: Diagnosis present

## 2014-06-20 DIAGNOSIS — Z9981 Dependence on supplemental oxygen: Secondary | ICD-10-CM

## 2014-06-20 DIAGNOSIS — I129 Hypertensive chronic kidney disease with stage 1 through stage 4 chronic kidney disease, or unspecified chronic kidney disease: Secondary | ICD-10-CM | POA: Diagnosis not present

## 2014-06-20 DIAGNOSIS — R918 Other nonspecific abnormal finding of lung field: Secondary | ICD-10-CM

## 2014-06-20 DIAGNOSIS — I5030 Unspecified diastolic (congestive) heart failure: Secondary | ICD-10-CM | POA: Diagnosis present

## 2014-06-20 DIAGNOSIS — G20 Parkinson's disease: Secondary | ICD-10-CM | POA: Diagnosis present

## 2014-06-20 DIAGNOSIS — Z86711 Personal history of pulmonary embolism: Secondary | ICD-10-CM

## 2014-06-20 DIAGNOSIS — Z66 Do not resuscitate: Secondary | ICD-10-CM | POA: Diagnosis present

## 2014-06-20 DIAGNOSIS — J9610 Chronic respiratory failure, unspecified whether with hypoxia or hypercapnia: Secondary | ICD-10-CM | POA: Diagnosis present

## 2014-06-20 DIAGNOSIS — I10 Essential (primary) hypertension: Secondary | ICD-10-CM | POA: Diagnosis present

## 2014-06-20 DIAGNOSIS — I2721 Secondary pulmonary arterial hypertension: Secondary | ICD-10-CM

## 2014-06-20 DIAGNOSIS — R0902 Hypoxemia: Secondary | ICD-10-CM

## 2014-06-20 DIAGNOSIS — Z87891 Personal history of nicotine dependence: Secondary | ICD-10-CM | POA: Diagnosis not present

## 2014-06-20 DIAGNOSIS — E46 Unspecified protein-calorie malnutrition: Secondary | ICD-10-CM | POA: Diagnosis present

## 2014-06-20 LAB — URINALYSIS, ROUTINE W REFLEX MICROSCOPIC
BILIRUBIN URINE: NEGATIVE
GLUCOSE, UA: NEGATIVE mg/dL
HGB URINE DIPSTICK: NEGATIVE
Ketones, ur: NEGATIVE mg/dL
Leukocytes, UA: NEGATIVE
Nitrite: NEGATIVE
PROTEIN: NEGATIVE mg/dL
Specific Gravity, Urine: 1.01 (ref 1.005–1.030)
UROBILINOGEN UA: 0.2 mg/dL (ref 0.0–1.0)
pH: 6 (ref 5.0–8.0)

## 2014-06-20 LAB — CBC WITH DIFFERENTIAL/PLATELET
BASOS ABS: 0 10*3/uL (ref 0.0–0.1)
BASOS PCT: 0 % (ref 0–1)
EOS ABS: 0.1 10*3/uL (ref 0.0–0.7)
EOS PCT: 1 % (ref 0–5)
HEMATOCRIT: 42.6 % (ref 36.0–46.0)
Hemoglobin: 13.4 g/dL (ref 12.0–15.0)
Lymphocytes Relative: 13 % (ref 12–46)
Lymphs Abs: 1.4 10*3/uL (ref 0.7–4.0)
MCH: 28 pg (ref 26.0–34.0)
MCHC: 31.5 g/dL (ref 30.0–36.0)
MCV: 88.9 fL (ref 78.0–100.0)
MONO ABS: 0.5 10*3/uL (ref 0.1–1.0)
MONOS PCT: 5 % (ref 3–12)
Neutro Abs: 8.6 10*3/uL — ABNORMAL HIGH (ref 1.7–7.7)
Neutrophils Relative %: 81 % — ABNORMAL HIGH (ref 43–77)
Platelets: 328 10*3/uL (ref 150–400)
RBC: 4.79 MIL/uL (ref 3.87–5.11)
RDW: 14.7 % (ref 11.5–15.5)
WBC: 10.7 10*3/uL — ABNORMAL HIGH (ref 4.0–10.5)

## 2014-06-20 LAB — I-STAT CHEM 8, ED
BUN: 46 mg/dL — ABNORMAL HIGH (ref 6–23)
Calcium, Ion: 1.11 mmol/L — ABNORMAL LOW (ref 1.13–1.30)
Chloride: 107 mEq/L (ref 96–112)
Creatinine, Ser: 2.1 mg/dL — ABNORMAL HIGH (ref 0.50–1.10)
GLUCOSE: 108 mg/dL — AB (ref 70–99)
HEMATOCRIT: 47 % — AB (ref 36.0–46.0)
Hemoglobin: 16 g/dL — ABNORMAL HIGH (ref 12.0–15.0)
Potassium: 4.1 mEq/L (ref 3.7–5.3)
Sodium: 138 mEq/L (ref 137–147)
TCO2: 22 mmol/L (ref 0–100)

## 2014-06-20 LAB — I-STAT TROPONIN, ED: TROPONIN I, POC: 0.01 ng/mL (ref 0.00–0.08)

## 2014-06-20 MED ORDER — FAMOTIDINE 20 MG PO TABS: 20.0000 mg | ORAL_TABLET | Freq: Every day | ORAL | Status: DC

## 2014-06-20 MED ORDER — ENOXAPARIN SODIUM 30 MG/0.3ML ~~LOC~~ SOLN
30.0000 mg | SUBCUTANEOUS | Status: DC
  Administered 2014-06-20 – 2014-06-24 (×5): 30 mg via SUBCUTANEOUS
  Filled 2014-06-20 (×5): qty 0.3

## 2014-06-20 MED ORDER — HYDRALAZINE HCL 25 MG PO TABS
25.0000 mg | ORAL_TABLET | Freq: Once | ORAL | Status: AC
  Administered 2014-06-20: 25 mg via ORAL
  Filled 2014-06-20: qty 1

## 2014-06-20 MED ORDER — PANTOPRAZOLE SODIUM 40 MG PO TBEC
40.0000 mg | DELAYED_RELEASE_TABLET | Freq: Every day | ORAL | Status: DC
  Administered 2014-06-21 – 2014-06-25 (×5): 40 mg via ORAL
  Filled 2014-06-20 (×5): qty 1

## 2014-06-20 MED ORDER — POLYETHYLENE GLYCOL 3350 17 GM/SCOOP PO POWD
17.0000 g | Freq: Every day | ORAL | Status: DC | PRN
  Filled 2014-06-20: qty 255

## 2014-06-20 MED ORDER — ISOSORBIDE MONONITRATE ER 30 MG PO TB24
30.0000 mg | ORAL_TABLET | Freq: Every day | ORAL | Status: DC
  Administered 2014-06-21 – 2014-06-22 (×2): 30 mg via ORAL
  Filled 2014-06-20 (×2): qty 1

## 2014-06-20 MED ORDER — CENTRUM SILVER PO TABS: 1.0000 | ORAL_TABLET | Freq: Every day | ORAL | Status: DC

## 2014-06-20 MED ORDER — SODIUM CHLORIDE 0.9 % IJ SOLN
3.0000 mL | Freq: Two times a day (BID) | INTRAMUSCULAR | Status: DC
  Administered 2014-06-20 – 2014-06-25 (×3): 3 mL via INTRAVENOUS

## 2014-06-20 MED ORDER — ACETAMINOPHEN 650 MG RE SUPP: 650.0000 mg | Freq: Four times a day (QID) | RECTAL | Status: DC | PRN

## 2014-06-20 MED ORDER — HYDRALAZINE HCL 20 MG/ML IJ SOLN
10.0000 mg | Freq: Four times a day (QID) | INTRAMUSCULAR | Status: DC | PRN
  Administered 2014-06-20 – 2014-06-22 (×3): 10 mg via INTRAVENOUS
  Filled 2014-06-20 (×5): qty 1

## 2014-06-20 MED ORDER — ADULT MULTIVITAMIN W/MINERALS CH
1.0000 | ORAL_TABLET | Freq: Every day | ORAL | Status: DC
  Administered 2014-06-21 – 2014-06-25 (×5): 1 via ORAL
  Filled 2014-06-20 (×5): qty 1

## 2014-06-20 MED ORDER — SODIUM CHLORIDE 0.9 % IV SOLN
INTRAVENOUS | Status: DC
  Administered 2014-06-20: 23:00:00 via INTRAVENOUS

## 2014-06-20 MED ORDER — CITRIC ACID-SODIUM CITRATE 334-500 MG/5ML PO SOLN
15.0000 mL | Freq: Every day | ORAL | Status: DC
  Administered 2014-06-21 – 2014-06-25 (×4): 15 mL via ORAL
  Filled 2014-06-20 (×5): qty 15

## 2014-06-20 MED ORDER — ONDANSETRON HCL 4 MG/2ML IJ SOLN: 4.0000 mg | Freq: Four times a day (QID) | INTRAMUSCULAR | Status: DC | PRN

## 2014-06-20 MED ORDER — BISACODYL 5 MG PO TBEC: 5.0000 mg | DELAYED_RELEASE_TABLET | Freq: Every day | ORAL | Status: DC | PRN

## 2014-06-20 MED ORDER — ACETAMINOPHEN 325 MG PO TABS
650.0000 mg | ORAL_TABLET | Freq: Four times a day (QID) | ORAL | Status: DC | PRN
  Administered 2014-06-21 – 2014-06-24 (×4): 650 mg via ORAL
  Filled 2014-06-20 (×4): qty 2

## 2014-06-20 MED ORDER — METOPROLOL SUCCINATE ER 25 MG PO TB24: 25.0000 mg | ORAL_TABLET | ORAL | Status: DC

## 2014-06-20 MED ORDER — PNEUMOCOCCAL VAC POLYVALENT 25 MCG/0.5ML IJ INJ
0.5000 mL | INJECTION | INTRAMUSCULAR | Status: DC
  Filled 2014-06-20: qty 0.5

## 2014-06-20 MED ORDER — HYDRALAZINE HCL 25 MG PO TABS: 25.0000 mg | ORAL_TABLET | Freq: Four times a day (QID) | ORAL | Status: DC

## 2014-06-20 MED ORDER — SODIUM CHLORIDE 0.9 % IV BOLUS (SEPSIS)
500.0000 mL | Freq: Once | INTRAVENOUS | Status: AC
  Administered 2014-06-20: 500 mL via INTRAVENOUS

## 2014-06-20 MED ORDER — ONDANSETRON HCL 4 MG PO TABS: 4.0000 mg | ORAL_TABLET | Freq: Four times a day (QID) | ORAL | Status: DC | PRN

## 2014-06-20 NOTE — ED Notes (Signed)
Pt reports she has been "feeling bad for several days."  Pt reports she went to get her hair done today at around 1100 and she began to feel light headed and like she was going to pass out.  Pt called EMS.  EMS reports pt was hypertensive upon arrival and has had 3 doses of her Metoprolol today with no change.

## 2014-06-20 NOTE — ED Notes (Signed)
Pt had a lot of difficulty standing and was very unsteady during the standing measurement.

## 2014-06-20 NOTE — H&P (Signed)
PCP:   [name], MD   Chief Complaint:  Felt faint  HPI: Nicole Harding is an 80 year old white female with a history of hypertension, chronic renal insufficiency, and Parkinson's who presented to the emergency department with the complaint of feeling faint. Patient states that she's been feeling poorly for the past several days.  Despite this, went to the beauty parlor this morning and when she returned she felt like she might pass out. She denies any vertigo symptoms but felt lightheaded. No nausea or vomiting.She called EMS who checked her blood pressure and it was running above 200.  They gave her an extra dose of her metoprolol with minimal improvement and then brought her to the emergency department where her blood pressure was 208/73.  She has a history of chronic lung disease on home oxygen at night.  She denies any significant increase in shortness of breath recently but does describe chronic dyspnea on exertion. She has had significant left shoulder and chest wall pain recently. The pain is worse with a deep breath. She had an cortisone injection into her left shoulder 2 days ago with minimal improvement.   Review of Systems:  Review of Systems - she has had some recent diarrhea. Past Medical History: Past Medical History  Diagnosis Date  . Hypertension   . History of orthostatic hypotension   . IBS (irritable bowel syndrome)   . Parkinson's disease     ATYPICAL  . Dizziness   . Vertigo   . Fatigue   . Advanced age   . HLD (hyperlipidemia)     intolerant to lipid lowering drugs  . Normal nuclear stress test 2003  . Fall at home 05/2012    mechanical fall  (06/01/2012)  . Pulmonary embolism 05/31/2012    off coumadin due to fall/SDH; has filter in place  . Chronic kidney disease, stage 4 (severe)   . Exertional dyspnea   . Arthritis     "legs, fingers" (06/01/2012)  . Subdural hematoma    Past Surgical History  Procedure Laterality Date  . Small intestine  surgery    . Hemorrhoid surgery  1970    "have4 had accidents ever since" (06/01/2012)  . Abdominal hysterectomy  1970's  . Tonsillectomy  1954  . Cholecystectomy  1955?  Marland Kitchen Breast cyst excision  1970's    left  . Cataract extraction w/ intraocular lens  implant, bilateral  ? 1990's  . Shoulder open rotator cuff repair  1990?    right  . Appendectomy    . Joint replacement    . Eye surgery      Medications: Prior to Admission medications   Medication Sig Start Date End Date Taking? Authorizing Provider  citric acid-sodium citrate (ORACIT) 334-500 MG/5ML solution Take 15 mLs by mouth daily.   Yes Historical Provider, MD  famotidine (PEPCID) 20 MG tablet Take 20 mg by mouth at bedtime.    Yes Historical Provider, MD  hydrALAZINE (APRESOLINE) 25 MG tablet Take 25 mg by mouth 4 (four) times daily.   Yes Historical Provider, MD  isosorbide mononitrate (IMDUR) 30 MG 24 hr tablet TAKE ONE TABLET EACH DAY   Yes [name], MD  metoprolol succinate (TOPROL-XL) 25 MG 24 hr tablet Take 25 mg by mouth See admin instructions. Take 1 tablet by mouth twice daily, as scheduled. If blood pressure exceeds 150, take additional tablet   Yes Historical Provider, MD  Multiple Vitamins-Minerals (CENTRUM SILVER PO) Take 1 tablet by mouth daily.  Yes Historical Provider, MD  Multiple Vitamins-Minerals (HAIR/SKIN/NAILS PO) Take 1 tablet by mouth daily.   Yes Historical Provider, MD  pantoprazole (PROTONIX) 40 MG tablet Take 1 tablet (40 mg total) by mouth daily. Take 30-60 min before first meal of the day 06/21/13  Yes Nyoka CowdenMichael B Wert, MD  polyethylene glycol powder (MIRALAX) powder Take 17 g by mouth daily as needed for mild constipation.    Yes Historical Provider, MD  azithromycin (ZITHROMAX) 250 MG tablet Take 2 tabs PO x 1 dose, then 1 tab PO QD x 4 days 01/05/14   Carmelina DaneJeffery S Anderson, MD  cephALEXin (KEFLEX) 500 MG capsule Take 1 capsule (500 mg total) by mouth 2 (two) times daily. 02/12/14   Shade FloodJeffrey R  Greene, MD  famotidine (PEPCID) 20 MG tablet TAKE ONE TABLET AT BEDTIME Patient not taking: Reported on 06/20/2014 01/14/14   Nyoka CowdenMichael B Wert, MD  hydrALAZINE (APRESOLINE) 25 MG tablet TAKE ONE TABLET FOUR TIMES DAILY Patient not taking: Reported on 06/20/2014    Cassell Clementhomas Brackbill, MD  metoprolol succinate (TOPROL-XL) 25 MG 24 hr tablet TAKE ONE TABLET TWICE DAILY -IF BLOOD PRESSURE GOES ABOVE 150-TAKE ANEXTRA PILL Patient not taking: Reported on 06/20/2014    Cassell Clementhomas Brackbill, MD    Allergies:   Allergies  Allergen Reactions  . Amlodipine Nausea And Vomiting and Swelling    Swelling --sick and ended up in hospital N&V  . Codeine Nausea And Vomiting  . Lipitor [Atorvastatin Calcium] Other (See Comments)    "cramps my legs"  . Lisinopril Other (See Comments)    unknown  . Prozac [Fluoxetine Hcl] Nausea And Vomiting    syncope  . Red Yeast Rice Other (See Comments)    unknown  . Zetia [Ezetimibe] Other (See Comments)    unknown  . Zoloft [Sertraline Hcl]     Extreme weakness    Social History:  reports that she quit smoking about 43 years ago. Her smoking use included Cigarettes. She has a 20 pack-year smoking history. She has never used smokeless tobacco. She reports that she does not drink alcohol or use illicit drugs.  [place]  Family History: Family History  Problem Relation Age of Onset  . Stroke Father   . Emphysema Brother     smoked  . Heart disease      siblings    Physical Exam: Filed Vitals:   06/20/14 1635 06/20/14 1645 06/20/14 1906 06/20/14 2007  BP: 172/87 187/85 208/75 194/75  Pulse: 58 56  56  Temp: 97.7 F (36.5 C)     TempSrc: Oral     Resp: 17 18  20   Height:      Weight:      SpO2: 98% 99%  98%   General appearance: alert and chronically ill Head: Normocephalic, without obvious abnormality, atraumatic Eyes: conjunctivae/corneas clear. PERRL, EOM's intact.  Nose: Nares normal. Septum midline. Mucosa normal. No drainage or sinus  tenderness. Throat: lips, mucosa, and tongue normal; teeth and gums normal Neck: no adenopathy, no carotid bruit, no JVD and thyroid not enlarged, symmetric, no tenderness/mass/nodules Resp: clear to auscultation bilaterally Cardio: regular rate and rhythm GI: soft, non-tender; bowel sounds normal; no masses,  no organomegaly Extremities: extremities normal, atraumatic, no cyanosis or edema Pulses: 2+ and symmetric Lymph nodes:  no cervical lymphadenopathy Neurologic: Alert and oriented X 3, normal strength and tone. Normal symmetric reflexes.     Labs on Admission:   Recent Labs  06/20/14 1611  NA 138  K 4.1  CL 107  GLUCOSE 108*  BUN 46*  CREATININE 2.10*     Recent Labs  06/20/14 1544 06/20/14 1611  WBC 10.7*  --   NEUTROABS 8.6*  --   HGB 13.4 16.0*  HCT 42.6 47.0*  MCV 88.9  --   PLT 328  --    No results for input(s): CKTOTAL, CKMB, CKMBINDEX, TROPONINI in the last 72 hours. Lab Results  Component Value Date   INR 1.24 06/20/2012   INR 1.21 06/19/2012   INR 1.48 06/19/2012   No results for input(s): TSH, T4TOTAL, T3FREE, THYROIDAB in the last 72 hours.  Invalid input(s): FREET3 No results for input(s): VITAMINB12, FOLATE, FERRITIN, TIBC, IRON, RETICCTPCT in the last 72 hours.  Radiological Exams on Admission: Dg Chest 2 View  06/20/2014   CLINICAL DATA:  Chest pain  EXAM: CHEST  2 VIEW  COMPARISON:  Chest radiograph 12/02/2013  FINDINGS: 3.4 x 3.0 cm ovoid opacity projects over the right hilum, likely hilar vasculature given stability since a prior study dating back to at least 05/31/2012. However, compared to less recent prior exams may indicate enlargement and the question of a pulmonary arterial aneurysm is raised. Heart size is normal. Bilateral upper lobe granuloma are stable. No pleural effusion. No acute osseous finding.  IMPRESSION: Ovoid right hilar masslike density which could represent hilar vasculature but a pulmonary arterial aneurysm could  appear similar. Chest CT with IV contrast is recommended for further evaluation.   Electronically Signed   By: Christiana PellantGretchen  Green M.D.   On: 06/20/2014 17:44   Orders placed or performed during the hospital encounter of 06/20/14  . ED EKG- sinus rhythm with first-degree AV block. No acute ST changes.    Assessment/Plan Principal Problem: 1. Near syncope- lightheadedness and near syncope may be related to her markedly elevated blood pressure. Will obtain orthostatics and monitor for recurrent symptoms with ambulation. Obtain echocardiogram to evaluate for valvular disease or pulmonary hypertension given chest x-ray appearance of possible pulmonary artery aneurysm.  Rule out MI though left-sided chest pain and shoulder pain are more likely musculoskeletal than cardiac.  Obtain carotid Dopplers.  Recent diarrhea may also be contributing.   Active Problems: 2. Malignant hypertension-blood pressure has improved slightly in the emergency department. She is not having any current headache, visual changes, or cardiac chest pain so we will try to manage her blood pressure with her home regimen and hydralazine as needed.  Recent cortisone injection may be contributing to elevated blood pressure. 3. CKD (chronic kidney disease) stage 4, GFR 15-29 ml/min- creatinine is slightly above baseline. Monitor with gentle hydration. 4.  Chronic respiratory failure/Oxygen dependent- she has chronic hypoxia of unclear etiology. Obtain echocardiogram with contrast to rule out shunt or pulmonary hypertension. 5. Disposition- PT/OT evaluations.  Anticipate discharge to home in 2-3 days after evaluation if stable.  Martha ClanShaw, Kellyjo Edgren 06/20/2014, 8:21 PM

## 2014-06-20 NOTE — ED Provider Notes (Signed)
CSN: [id number]     Arrival date & time 06/20/14  1509 History   First MD Initiated Contact with Patient 06/20/14 1523     Chief Complaint  Patient presents with  . Near Syncope     (Consider location/radiation/quality/duration/timing/severity/associated sxs/prior Treatment) HPI Comments: Patient with a history of hypertension, Parkinson's, PE status post IVC filter, off Coumadin secondary to subdural hematoma presents with near syncopal episode. She states she's been feeling weak and dizzy for the last 2-3 days. She states she felt bad this morning and that she was feeling weak all over and dizzy feeling. She went to the beauty parlor where she felt like she might pass out. [place] she [place] and felt worse like she might pass out. She was having some nausea but no vomiting. She called EMS and was transported here. She describes the dizziness as feeling lightheaded. She's had some nausea but no vomiting. She felt weak all over without unilateral deficits. She denies any speech deficits or vision changes. She denies any chestdiscomfort. She has shortness of breath which she says is at baseline. She uses oxygen at night and periodically through the day for symptomatic relief. She states that she hasn't been using any more than she normally does have the last few days. She denies any cough or chest congestion. She denies any urinary symptoms. She denies abdominal pain. She does have pain in her left shoulder. She states her shoulder has been hurting her for about the last month and she's followed by an orthopedist for this. She got a cortisone injection 2 days ago for the symptoms. She states the pain is worse with movement of her shoulder. She can't raise her armdue to the discomfort.  She does say that she has diarrhea which is at baseline for her. She has a history of irritable bowel syndrome.  Patient is a 80 y.o. female presenting with near-syncope.  Near  Syncope Pertinent negatives include no chest pain, no abdominal pain, no headaches and no shortness of breath.    Past Medical History  Diagnosis Date  . Hypertension   . History of orthostatic hypotension   . IBS (irritable bowel syndrome)   . Parkinson's disease     ATYPICAL  . Dizziness   . Vertigo   . Fatigue   . Advanced age   . HLD (hyperlipidemia)     intolerant to lipid lowering drugs  . Normal nuclear stress test 2003  . Fall at home 05/2012    mechanical fall  (06/01/2012)  . Pulmonary embolism 05/31/2012    off coumadin due to fall/SDH; has filter in place  . Chronic kidney disease, stage 4 (severe)   . Exertional dyspnea   . Arthritis     "legs, fingers" (06/01/2012)  . Subdural hematoma    Past Surgical History  Procedure Laterality Date  . Small intestine surgery    . Hemorrhoid surgery  1970    "have4 had accidents ever since" (06/01/2012)  . Abdominal hysterectomy  1970's  . Tonsillectomy  1954  . Cholecystectomy  1955?  Marland Kitchen. Breast cyst excision  1970's    left  . Cataract extraction w/ intraocular lens  implant, bilateral  ? 1990's  . Shoulder open rotator cuff repair  1990?    right  . Appendectomy    . Joint replacement    . Eye surgery     Family History  Problem Relation Age of Onset  . Stroke Father   .  Emphysema Brother     smoked  . Heart disease      siblings   History  Substance Use Topics  . Smoking status: Former Smoker -- 1.00 packs/day for 20 years    Types: Cigarettes    Quit date: 02/14/1971  . Smokeless tobacco: Never Used  . Alcohol Use: No   OB History    No data available     Review of Systems  Constitutional: Positive for fatigue. Negative for fever, chills and diaphoresis.  HENT: Negative for congestion, rhinorrhea and sneezing.   Eyes: Negative.   Respiratory: Negative for cough, chest tightness and shortness of breath.   Cardiovascular: Positive for near-syncope. Negative for chest pain and leg swelling.   Gastrointestinal: Positive for nausea. Negative for vomiting, abdominal pain, diarrhea and blood in stool.  Genitourinary: Negative for frequency, hematuria, flank pain and difficulty urinating.  Musculoskeletal: Positive for arthralgias. Negative for back pain.  Skin: Negative for rash.  Neurological: Positive for light-headedness. Negative for dizziness, speech difficulty, weakness, numbness and headaches.      Allergies  Amlodipine; Codeine; Lipitor; Lisinopril; Prozac; Red yeast rice; Zetia; and Zoloft  Home Medications   Prior to Admission medications   Medication Sig Start Date End Date Taking? Authorizing Provider  citric acid-sodium citrate (ORACIT) 334-500 MG/5ML solution Take 15 mLs by mouth daily.   Yes Historical Provider, MD  famotidine (PEPCID) 20 MG tablet Take 20 mg by mouth at bedtime.    Yes Historical Provider, MD  hydrALAZINE (APRESOLINE) 25 MG tablet Take 25 mg by mouth 4 (four) times daily.   Yes Historical Provider, MD  isosorbide mononitrate (IMDUR) 30 MG 24 hr tablet TAKE ONE TABLET EACH DAY   Yes Cassell Clementhomas Brackbill, MD  metoprolol succinate (TOPROL-XL) 25 MG 24 hr tablet Take 25 mg by mouth See admin instructions. Take 1 tablet by mouth twice daily, as scheduled. If blood pressure exceeds 150, take additional tablet   Yes Historical Provider, MD  Multiple Vitamins-Minerals (CENTRUM SILVER PO) Take 1 tablet by mouth daily.   Yes Historical Provider, MD  Multiple Vitamins-Minerals (HAIR/SKIN/NAILS PO) Take 1 tablet by mouth daily.   Yes Historical Provider, MD  pantoprazole (PROTONIX) 40 MG tablet Take 1 tablet (40 mg total) by mouth daily. Take 30-60 min before first meal of the day 06/21/13  Yes Nyoka CowdenMichael B Wert, MD  polyethylene glycol powder (MIRALAX) powder Take 17 g by mouth daily as needed for mild constipation.    Yes Historical Provider, MD  azithromycin (ZITHROMAX) 250 MG tablet Take 2 tabs PO x 1 dose, then 1 tab PO QD x 4 days 01/05/14   Carmelina DaneJeffery S Anderson,  MD  cephALEXin (KEFLEX) 500 MG capsule Take 1 capsule (500 mg total) by mouth 2 (two) times daily. 02/12/14   Shade FloodJeffrey R Greene, MD  famotidine (PEPCID) 20 MG tablet TAKE ONE TABLET AT BEDTIME Patient not taking: Reported on 06/20/2014 01/14/14   Nyoka CowdenMichael B Wert, MD  hydrALAZINE (APRESOLINE) 25 MG tablet TAKE ONE TABLET FOUR TIMES DAILY Patient not taking: Reported on 06/20/2014    Cassell Clementhomas Brackbill, MD  metoprolol succinate (TOPROL-XL) 25 MG 24 hr tablet TAKE ONE TABLET TWICE DAILY -IF BLOOD PRESSURE GOES ABOVE 150-TAKE ANEXTRA PILL Patient not taking: Reported on 06/20/2014    Cassell Clementhomas Brackbill, MD   BP 208/75 mmHg  Pulse 56  Temp(Src) 97.7 F (36.5 C) (Oral)  Resp 18  Ht 5\' 6"  (1.676 m)  Wt 134 lb (60.782 kg)  BMI 21.64 kg/m2  SpO2 99%  Physical Exam  Constitutional: She is oriented to person, place, and time. She appears well-developed and well-nourished.  HENT:  Head: Normocephalic and atraumatic.  Eyes: Pupils are equal, round, and reactive to light.  Neck: Normal range of motion. Neck supple.  Cardiovascular: Normal rate, regular rhythm and normal heart sounds.   Pulmonary/Chest: Effort normal and breath sounds normal. No respiratory distress. She has no wheezes. She has no rales. She exhibits no tenderness.  Abdominal: Soft. Bowel sounds are normal. There is no tenderness. There is no rebound and no guarding.  Musculoskeletal: Normal range of motion. She exhibits no edema.  Lymphadenopathy:    She has no cervical adenopathy.  Neurological: She is alert and oriented to person, place, and time. She has normal strength. No cranial nerve deficit or sensory deficit.  Left arm movement diminished due to shoulder pain  Skin: Skin is warm and dry. No rash noted.  Psychiatric: She has a normal mood and affect.    ED Course  Procedures (including critical care time) Labs Review Labs Reviewed  CBC WITH DIFFERENTIAL - Abnormal; Notable for the following:    WBC 10.7 (*)    Neutrophils  Relative % 81 (*)    Neutro Abs 8.6 (*)    All other components within normal limits  I-STAT CHEM 8, ED - Abnormal; Notable for the following:    BUN 46 (*)    Creatinine, Ser 2.10 (*)    Glucose, Bld 108 (*)    Calcium, Ion 1.11 (*)    Hemoglobin 16.0 (*)    HCT 47.0 (*)    All other components within normal limits  URINALYSIS, ROUTINE W REFLEX MICROSCOPIC  I-STAT TROPOININ, ED    Imaging Review Dg Chest 2 View  06/20/2014   CLINICAL DATA:  Chest pain  EXAM: CHEST  2 VIEW  COMPARISON:  Chest radiograph 12/02/2013  FINDINGS: 3.4 x 3.0 cm ovoid opacity projects over the right hilum, likely hilar vasculature given stability since a prior study dating back to at least 05/31/2012. However, compared to less recent prior exams may indicate enlargement and the question of a pulmonary arterial aneurysm is raised. Heart size is normal. Bilateral upper lobe granuloma are stable. No pleural effusion. No acute osseous finding.  IMPRESSION: Ovoid right hilar masslike density which could represent hilar vasculature but a pulmonary arterial aneurysm could appear similar. Chest CT with IV contrast is recommended for further evaluation.   Electronically Signed   By: Christiana Pellant M.D.   On: 06/20/2014 17:44     EKG Interpretation   Date/Time:  Friday June 20 2014 16:33:37 EST Ventricular Rate:  54 PR Interval:  221 QRS Duration: 81 QT Interval:  448 QTC Calculation: 425 R Axis:   -2 Text Interpretation:  Age not entered, assumed to be  80 years old for  purpose of ECG interpretation Sinus rhythm Prolonged PR interval since  last tracing no significant change Confirmed by Reily Ilic  MD, Cyntia Staley ([id number])  on 06/20/2014 6:37:41 PM      MDM   Final diagnoses:  Near syncope    Pt presents after a near syncopal episode.  No headache/CP.  No ischemic changes on EKG.  Labs unremarkable, creatinine at baseline.  Urine neg.  Will consult GMA for admission.  Pt sees Dr. Link Snuffer.  Spoke with Dr.  Clelia Croft who will see pt.  Pt had abnormal appearing CXR that could not exclude a pulmonary artery aneurysm.  We are unable to get chest CT given elevated creatinine.  Rolan Bucco, MD 06/20/14 [phone]

## 2014-06-21 DIAGNOSIS — I639 Cerebral infarction, unspecified: Secondary | ICD-10-CM

## 2014-06-21 LAB — BASIC METABOLIC PANEL
Anion gap: 15 (ref 5–15)
BUN: 40 mg/dL — AB (ref 6–23)
CHLORIDE: 107 meq/L (ref 96–112)
CO2: 18 meq/L — AB (ref 19–32)
Calcium: 8.5 mg/dL (ref 8.4–10.5)
Creatinine, Ser: 2.1 mg/dL — ABNORMAL HIGH (ref 0.50–1.10)
GFR calc Af Amer: 23 mL/min — ABNORMAL LOW (ref >90)
GFR calc non Af Amer: 20 mL/min — ABNORMAL LOW (ref >90)
Glucose, Bld: 91 mg/dL (ref 70–99)
Potassium: 4.9 mEq/L (ref 3.7–5.3)
Sodium: 140 mEq/L (ref 137–147)

## 2014-06-21 LAB — TROPONIN I
Troponin I: <0.3 ng/mL (ref <0.30)
Troponin I: <0.3 ng/mL (ref <0.30)
Troponin I: <0.3 ng/mL (ref <0.30)

## 2014-06-21 LAB — CBC
HEMATOCRIT: 38.7 % (ref 36.0–46.0)
Hemoglobin: 12.6 g/dL (ref 12.0–15.0)
MCH: 29.5 pg (ref 26.0–34.0)
MCHC: 32.6 g/dL (ref 30.0–36.0)
MCV: 90.6 fL (ref 78.0–100.0)
Platelets: 293 10*3/uL (ref 150–400)
RBC: 4.27 MIL/uL (ref 3.87–5.11)
RDW: 14.5 % (ref 11.5–15.5)
WBC: 9.5 10*3/uL (ref 4.0–10.5)

## 2014-06-21 LAB — TSH: TSH: 2.22 u[IU]/mL (ref 0.350–4.500)

## 2014-06-21 MED ORDER — METOPROLOL SUCCINATE ER 25 MG PO TB24
25.0000 mg | ORAL_TABLET | Freq: Every day | ORAL | Status: DC
  Administered 2014-06-21 – 2014-06-25 (×5): 25 mg via ORAL
  Filled 2014-06-21 (×5): qty 1

## 2014-06-21 MED ORDER — HYDRALAZINE HCL 25 MG PO TABS
25.0000 mg | ORAL_TABLET | Freq: Four times a day (QID) | ORAL | Status: DC
  Administered 2014-06-21 – 2014-06-25 (×16): 25 mg via ORAL
  Filled 2014-06-21 (×16): qty 1

## 2014-06-21 NOTE — Progress Notes (Signed)
Subjective: She feels a little better.  Unsteady when ambulating with PT.  Breathing at baseline with dyspnea on exertion.    Objective: Vital signs in last 24 hours: Temp:  [97.3 F (36.3 C)-97.8 F (36.6 C)] 97.7 F (36.5 C) (11/14 1308) Pulse Rate:  [56-73] 73 (11/14 1308) Resp:  [17-28] 20 (11/14 1308) BP: (172-219)/(62-87) 173/62 mmHg (11/14 1308) SpO2:  [92 %-99 %] 98 % (11/14 1308) Weight:  [60.782 kg (134 lb)-63.594 kg (140 lb 3.2 oz)] 62.596 kg (138 lb) (11/14 0500) Weight change:  Last BM Date: 06/20/14  CBG (last 3)  No results for input(s): GLUCAP in the last 72 hours.  Intake/Output from previous day:   Intake/Output this shift: Total I/O In: 120 [P.O.:120] Out: 200 [Urine:200]  General appearance: mild tachypnea Eyes: no scleral icterus Throat: oropharynx moist without erythema Resp: clear to auscultation bilaterally Cardio: regular rate and rhythm GI: soft, non-tender; bowel sounds normal; no masses,  no organomegaly Extremities: no clubbing, cyanosis or edema   Lab Results:  Recent Labs  06/20/14 1611 06/21/14 0322  NA 138 140  K 4.1 4.9  CL 107 107  CO2  --  18*  GLUCOSE 108* 91  BUN 46* 40*  CREATININE 2.10* 2.10*  CALCIUM  --  8.5   No results for input(s): AST, ALT, ALKPHOS, BILITOT, PROT, ALBUMIN in the last 72 hours.  Recent Labs  06/20/14 1544 06/20/14 1611 06/21/14 0322  WBC 10.7*  --  9.5  NEUTROABS 8.6*  --   --   HGB 13.4 16.0* 12.6  HCT 42.6 47.0* 38.7  MCV 88.9  --  90.6  PLT 328  --  293   Lab Results  Component Value Date   INR 1.24 06/20/2012   INR 1.21 06/19/2012   INR 1.48 06/19/2012    Recent Labs  06/20/14 2316 06/21/14 0322 06/21/14 0855  TROPONINI <0.30 <0.30 <0.30    Recent Labs  06/20/14 2316  TSH 2.220   No results for input(s): VITAMINB12, FOLATE, FERRITIN, TIBC, IRON, RETICCTPCT in the last 72 hours.  Studies/Results: Dg Chest 2 View  06/20/2014   CLINICAL DATA:  Chest pain  EXAM:  CHEST  2 VIEW  COMPARISON:  Chest radiograph 12/02/2013  FINDINGS: 3.4 x 3.0 cm ovoid opacity projects over the right hilum, likely hilar vasculature given stability since a prior study dating back to at least 05/31/2012. However, compared to less recent prior exams may indicate enlargement and the question of a pulmonary arterial aneurysm is raised. Heart size is normal. Bilateral upper lobe granuloma are stable. No pleural effusion. No acute osseous finding.  IMPRESSION: Ovoid right hilar masslike density which could represent hilar vasculature but a pulmonary arterial aneurysm could appear similar. Chest CT with IV contrast is recommended for further evaluation.   Electronically Signed   By: Christiana PellantGretchen  Green M.D.   On: 06/20/2014 17:44     Medications: Scheduled: . citric acid-sodium citrate  15 mL Oral Daily  . enoxaparin (LOVENOX) injection  30 mg Subcutaneous Q24H  . isosorbide mononitrate  30 mg Oral Daily  . multivitamin with minerals  1 tablet Oral Daily  . pantoprazole  40 mg Oral Daily  . pneumococcal 23 valent vaccine  0.5 mL Intramuscular Tomorrow-1000  . sodium chloride  3 mL Intravenous Q12H   Continuous: . sodium chloride 50 mL/hr at 06/20/14 2234    Assessment/Plan: Principal Problem: 1. Near syncope- Echocardiogram, carotid dopplers and orthostatics are pending.  Pulmonary disease may be contributing. Monitor with  gentle hydration. Active Problems: 2. Malignant hypertension-blood pressure slightly improved- continue current treatment and monitor.  Hydralazine and Toprol have been discontinued for some reason- will resume.   3. CKD (chronic kidney disease) stage 4, GFR 15-29 ml/min- creatinine is slightly above baseline. Monitor with gentle hydration. 4. Chronic respiratory failure/Oxygen dependent- she has chronic hypoxia of unclear etiology. Obtain echocardiogram with contrast to rule out shunt or pulmonary hypertension. 5. Disposition- continue PT/OT for generalized  weakness.  OOB to chair.  CIR consult.   LOS: 1 day   Martha ClanShaw, Jamin Panther 06/21/2014, 2:20 PM

## 2014-06-21 NOTE — Evaluation (Signed)
Physical Therapy Evaluation Patient Details Name: Nigel BridgemanDorothy M Washinton MRN: [id number] DOB: [birth year] Today's Date: 06/21/2014   History of Present Illness  [name] is an 80 year old white female with a history of hypertension, chronic renal insufficiency, and Parkinson's who presented to the emergency department with the complaint of feeling faint. Patient states that she's been feeling poorly for the past several days.  Despite this, went to the beauty parlor this morning and when she returned she felt like she might pass out. She denies any vertigo symptoms but felt lightheaded. No nausea or vomiting.She called EMS who checked her blood pressure and it was running above 200.  They gave her an extra dose of her metoprolol with minimal improvement and then brought her to the emergency department where her blood pressure was 208/73.  She has a history of chronic lung disease on home oxygen at night.  She denies any significant increase in shortness of breath recently but does describe chronic dyspnea on exertion. She has had significant left shoulder and chest wall pain recently. The pain is worse with a deep breath. She had an cortisone injection into her left shoulder 2 days ago with minimal improvement  Clinical Impression  Pt admitted with the above. Pt currently with functional limitations due to the deficits listed below (see PT Problem List). Limited assessment due to BP (see orthostatic vitals at 1125 on 06/21/2014). Pt with ballistic-like truncal ataxia upon standing today. Very easily corrected with UE support on a rolling walker but requires physical assist to stand without an assistive device. Pt was independent without an assistive device prior to admission. Feel she would greatly benefit from CIR to help her return to prior level of function before returning to her independent living facility.  Pt will benefit from skilled PT to increase their independence and safety with mobility. Will  continue to follow acutely and progress pts mobility as medically able.      Follow Up Recommendations CIR;Supervision for mobility/OOB    Equipment Recommendations   (TBD based on progression)    Recommendations for Other Services OT consult     Precautions / Restrictions Precautions Precautions: Fall Restrictions Weight Bearing Restrictions: No      Mobility  Bed Mobility Overal bed mobility: Needs Assistance Bed Mobility: Supine to Sit;Sit to Supine     Supine to sit: Min guard Sit to supine: Min guard   General bed mobility comments: Close guard assist for safety. Use of bed rail to rise. Able to complete without physical assist. VC for sequencing throughout task. Good ability to scoot self towards head of bed.  Transfers Overall transfer level: Needs assistance Equipment used: None;Rolling walker (2 wheeled) Transfers: Sit to/from Stand Sit to Stand: Min assist         General transfer comment: Pt with adequate LE strength to rise however truncal ataxia, almost ballistic type movements once standing. Easily corrected with support through hands on a rolling walker, however when attempted without assistive device pt requires physical assist to maintain standing. She was able to stand for 2 minutes with a rolling walker for support but required to sit due to BP as indicated in orthostatic vitals section. Pt without significant complaint of dizziness.  Ambulation/Gait                Stairs            Wheelchair Mobility    Modified Rankin (Stroke Patients Only)       Balance Overall  balance assessment: Needs assistance Sitting-balance support: No upper extremity supported;Feet supported Sitting balance-Leahy Scale: Fair     Standing balance support: Bilateral upper extremity supported Standing balance-Leahy Scale: Poor                               Pertinent Vitals/Pain Pain Assessment: 0-10 Pain Score:  ("okay right now but it  hurts if I try to lift it") Pain Location: Lt shoulder ("recent cortisone shot" -radiates down arm and left flank) Pain Descriptors / Indicators: Aching Pain Intervention(s): Monitored during session;Repositioned  SpO2 98% on 3L supplemental O2 at start of therapy 85% on room air after standing 96% on 2L supplemental O2 at end of therapy    Home Living Family/patient expects to be discharged to:: Unsure (Lives at [facility]) Living Arrangements: Alone Available Help at Discharge: Other (Comment) (Independent living facility) Type of Home: Independent living facility Home Access: Level entry     Home Layout: One level Home Equipment: Walker - 4 wheels;Cane - single point;Shower seat      Prior Function Level of Independence: Needs assistance   Gait / Transfers Assistance Needed: does not use rollator  ADL's / Homemaking Assistance Needed: bath/dress herself - Food provided by living facility        Hand Dominance   Dominant Hand: Left    Extremity/Trunk Assessment   Upper Extremity Assessment: Defer to OT evaluation           Lower Extremity Assessment: Generalized weakness         Communication   Communication: HOH  Cognition Arousal/Alertness: Awake/alert Behavior During Therapy: WFL for tasks assessed/performed Overall Cognitive Status: Within Functional Limits for tasks assessed                      General Comments General comments (skin integrity, edema, etc.): Time spent discussing home enviornment and level of independence pt will need to achieve in order to return home. Pts POA was present and discussed d/c options with both pt and POA. Discribed role of PT and educated on progression of functional independence as pt progressess medically. Pt complains of signficant Rt shoulder pain that is being followed by her orthopedic surgeon.    Exercises        Assessment/Plan    PT Assessment Patient needs continued PT  services  PT Diagnosis Difficulty walking;Acute pain;Generalized weakness   PT Problem List Decreased strength;Decreased range of motion;Decreased activity tolerance;Decreased balance;Decreased mobility;Decreased coordination;Decreased knowledge of use of DME;Decreased knowledge of precautions;Pain  PT Treatment Interventions DME instruction;Gait training;Functional mobility training;Therapeutic activities;Balance training;Therapeutic exercise;Neuromuscular re-education;Cognitive remediation;Patient/family education;Modalities   PT Goals (Current goals can be found in the Care Plan section) Acute Rehab PT Goals Patient Stated Goal: Go home PT Goal Formulation: With patient Time For Goal Achievement: 07/05/14 Potential to Achieve Goals: Good    Frequency Min 3X/week   Barriers to discharge Decreased caregiver support Pt lives alone at independent living facility    Co-evaluation               End of Session Equipment Utilized During Treatment: Gait belt;Oxygen Activity Tolerance: Treatment limited secondary to medical complications (Comment) (BP) Patient left: in bed;with call bell/phone within reach;with family/visitor present Nurse Communication: Mobility status;Other (comment) (BP)         Time: 4540-9811 PT Time Calculation (min) (ACUTE ONLY): 55 min   Charges:   PT Evaluation $Initial PT Evaluation  Tier I: 1 Procedure PT Treatments $Therapeutic Activity: 23-37 mins $Self Care/Home Management: 8-22   PT G Codes:          [place], Tatayana Beshears S 06/21/2014, 1:30 PM  Sunday SpillersLogan Sec[place], [place]PT [phone]

## 2014-06-21 NOTE — Plan of Care (Signed)
Problem: Phase I Progression Outcomes Goal: Pain controlled with appropriate interventions Outcome: Completed/Met Date Met:  06/21/14 Goal: Voiding-avoid urinary catheter unless indicated Outcome: Completed/Met Date Met:  06/21/14 Goal: Hemodynamically stable Outcome: Completed/Met Date Met:  06/21/14 Goal: Other Phase I Outcomes/Goals Outcome: Completed/Met Date Met:  06/21/14

## 2014-06-21 NOTE — Progress Notes (Signed)
VASCULAR LAB PRELIMINARY  PRELIMINARY  PRELIMINARY  PRELIMINARY  Carotid Dopplers completed.    Preliminary report:  1-39% ICA stenosis.  Vertebral artery flow is antegrade.   Bobie Kistler, RVT 06/21/2014, 3:00 PM

## 2014-06-22 DIAGNOSIS — I059 Rheumatic mitral valve disease, unspecified: Secondary | ICD-10-CM

## 2014-06-22 LAB — CBC
HCT: 38 % (ref 36.0–46.0)
Hemoglobin: 12.2 g/dL (ref 12.0–15.0)
MCH: 29.3 pg (ref 26.0–34.0)
MCHC: 32.1 g/dL (ref 30.0–36.0)
MCV: 91.1 fL (ref 78.0–100.0)
Platelets: 262 10*3/uL (ref 150–400)
RBC: 4.17 MIL/uL (ref 3.87–5.11)
RDW: 14.5 % (ref 11.5–15.5)
WBC: 7.1 10*3/uL (ref 4.0–10.5)

## 2014-06-22 LAB — BASIC METABOLIC PANEL
Anion gap: 15 (ref 5–15)
BUN: 39 mg/dL — ABNORMAL HIGH (ref 6–23)
CO2: 18 mEq/L — ABNORMAL LOW (ref 19–32)
CREATININE: 2.07 mg/dL — AB (ref 0.50–1.10)
Calcium: 8.5 mg/dL (ref 8.4–10.5)
Chloride: 104 mEq/L (ref 96–112)
GFR calc Af Amer: 23 mL/min — ABNORMAL LOW (ref >90)
GFR calc non Af Amer: 20 mL/min — ABNORMAL LOW (ref >90)
Glucose, Bld: 93 mg/dL (ref 70–99)
Potassium: 4.3 mEq/L (ref 3.7–5.3)
Sodium: 137 mEq/L (ref 137–147)

## 2014-06-22 MED ORDER — TRAMADOL HCL 50 MG PO TABS
50.0000 mg | ORAL_TABLET | Freq: Four times a day (QID) | ORAL | Status: DC | PRN
  Administered 2014-06-22: 50 mg via ORAL
  Filled 2014-06-22: qty 1

## 2014-06-22 MED ORDER — MORPHINE SULFATE 2 MG/ML IJ SOLN: 2.0000 mg | INTRAMUSCULAR | Status: DC | PRN

## 2014-06-22 MED ORDER — HYDROCODONE-ACETAMINOPHEN 5-325 MG PO TABS: 1.0000 | ORAL_TABLET | Freq: Four times a day (QID) | ORAL | Status: DC | PRN

## 2014-06-22 NOTE — Progress Notes (Signed)
  Echocardiogram 2D Echocardiogram has been performed.  Nicole Harding, Nicole Harding 06/22/2014, 3:56 PM

## 2014-06-22 NOTE — Progress Notes (Signed)
Subjective: Had increased shoulder pain last evening- improved with tramadol.  Continues to feel weak but a little better.  Objective: Vital signs in last 24 hours: Temp:  [97.7 F (36.5 C)-97.9 F (36.6 C)] 97.9 F (36.6 C) (11/15 1009) Pulse Rate:  [63-75] 69 (11/15 0604) Resp:  [17-20] 17 (11/15 1009) BP: (155-196)/(62-82) 155/64 mmHg (11/15 0652) SpO2:  [97 %-99 %] 99 % (11/15 1009) Weight:  [65.772 kg (145 lb)] 65.772 kg (145 lb) (11/15 0604) Weight change: 4.99 kg (11 lb) Last BM Date: 06/20/14  Orthostatics: 180/74 62 lying-->183/92 70 sitting-->165/77 81 standing  CBG (last 3)  No results for input(s): GLUCAP in the last 72 hours.  Intake/Output from previous day: 11/14 0701 - 11/15 0700 In: 1020 [P.O.:120; I.V.:900] Out: 600 [Urine:600] Intake/Output this shift: Total I/O In: -  Out: 450 [Urine:450]  General appearance: frail, chronically ill Eyes: no scleral icterus Throat: oropharynx moist without erythema Resp: clear to auscultation bilaterally Cardio: regular rate and rhythm GI: soft, non-tender; bowel sounds normal; no masses,  no organomegaly Extremities: no clubbing, cyanosis or edema   Lab Results:  Recent Labs  06/21/14 0322 06/22/14 0315  NA 140 137  K 4.9 4.3  CL 107 104  CO2 18* 18*  GLUCOSE 91 93  BUN 40* 39*  CREATININE 2.10* 2.07*  CALCIUM 8.5 8.5   No results for input(s): AST, ALT, ALKPHOS, BILITOT, PROT, ALBUMIN in the last 72 hours.  Recent Labs  06/20/14 1544  06/21/14 0322 06/22/14 0315  WBC 10.7*  --  9.5 7.1  NEUTROABS 8.6*  --   --   --   HGB 13.4  < > 12.6 12.2  HCT 42.6  < > 38.7 38.0  MCV 88.9  --  90.6 91.1  PLT 328  --  293 262  < > = values in this interval not displayed. Lab Results  Component Value Date   INR 1.24 06/20/2012   INR 1.21 06/19/2012   INR 1.48 06/19/2012    Recent Labs  06/20/14 2316 06/21/14 0322 06/21/14 0855  TROPONINI <0.30 <0.30 <0.30    Recent Labs  06/20/14 2316  TSH  2.220   No results for input(s): VITAMINB12, FOLATE, FERRITIN, TIBC, IRON, RETICCTPCT in the last 72 hours.  Studies/Results: Dg Chest 2 View  06/20/2014   CLINICAL DATA:  Chest pain  EXAM: CHEST  2 VIEW  COMPARISON:  Chest radiograph 12/02/2013  FINDINGS: 3.4 x 3.0 cm ovoid opacity projects over the right hilum, likely hilar vasculature given stability since a prior study dating back to at least 05/31/2012. However, compared to less recent prior exams may indicate enlargement and the question of a pulmonary arterial aneurysm is raised. Heart size is normal. Bilateral upper lobe granuloma are stable. No pleural effusion. No acute osseous finding.  IMPRESSION: Ovoid right hilar masslike density which could represent hilar vasculature but a pulmonary arterial aneurysm could appear similar. Chest CT with IV contrast is recommended for further evaluation.   Electronically Signed   By: Christiana PellantGretchen  Green M.D.   On: 06/20/2014 17:44   Carotid Dopplers: Preliminary report: 1-39% ICA stenosis. Vertebral artery flow is antegrade.   Echo- not done yet  Medications: Scheduled: . citric acid-sodium citrate  15 mL Oral Daily  . enoxaparin (LOVENOX) injection  30 mg Subcutaneous Q24H  . hydrALAZINE  25 mg Oral 4 times per day  . isosorbide mononitrate  30 mg Oral Daily  . metoprolol succinate  25 mg Oral Daily  . multivitamin with  minerals  1 tablet Oral Daily  . pantoprazole  40 mg Oral Daily  . pneumococcal 23 valent vaccine  0.5 mL Intramuscular Tomorrow-1000  . sodium chloride  3 mL Intravenous Q12H   Continuous: . sodium chloride 50 mL/hr at 06/20/14 2234    Assessment/Plan: Principal Problem: 1. Near syncope- She is mildly orthostatic despite elevated BP.  Will increase IV fluids slightly and continue to monitor.  Carotid dopplers relatively normal. Pulmonary disease may be contributing. Echocardiogram not done yet.  Active Problems: 2. Malignant hypertension-blood pressure slightly  improved- will continue current treatment and monitor- orthostasis makes aggressive treatment difficult.  3. CKD (chronic kidney disease) stage 4, GFR 15-29 ml/min- creatinine is slightly above baseline. Monitor with gentle hydration. 4. Chronic respiratory failure/Oxygen dependent- she has chronic hypoxia of unclear etiology. Obtain echocardiogram with contrast to rule out shunt or pulmonary hypertension. 5. Disposition- continue PT/OT for generalized weakness. OOB to chair. CIR consult pending.  Possible discharge in 1-2 days pending echo results.   LOS: 2 days   Martha ClanShaw, Jyra Lagares 06/22/2014, 10:31 AM

## 2014-06-22 NOTE — Plan of Care (Signed)
Problem: Phase I Progression Outcomes Goal: OOB as tolerated unless otherwise ordered Outcome: Completed/Met Date Met:  06/22/14 Goal: Initial discharge plan identified Outcome: Completed/Met Date Met:  06/22/14  Problem: Phase II Progression Outcomes Goal: Progress activity as tolerated unless otherwise ordered Outcome: Completed/Met Date Met:  06/22/14

## 2014-06-23 ENCOUNTER — Inpatient Hospital Stay (HOSPITAL_COMMUNITY): Payer: Medicare Other

## 2014-06-23 DIAGNOSIS — Z9981 Dependence on supplemental oxygen: Secondary | ICD-10-CM

## 2014-06-23 DIAGNOSIS — J9611 Chronic respiratory failure with hypoxia: Secondary | ICD-10-CM

## 2014-06-23 DIAGNOSIS — I272 Other secondary pulmonary hypertension: Secondary | ICD-10-CM

## 2014-06-23 DIAGNOSIS — R55 Syncope and collapse: Secondary | ICD-10-CM

## 2014-06-23 DIAGNOSIS — R918 Other nonspecific abnormal finding of lung field: Secondary | ICD-10-CM

## 2014-06-23 DIAGNOSIS — I2721 Secondary pulmonary arterial hypertension: Secondary | ICD-10-CM

## 2014-06-23 DIAGNOSIS — R269 Unspecified abnormalities of gait and mobility: Secondary | ICD-10-CM

## 2014-06-23 LAB — BASIC METABOLIC PANEL
Anion gap: 14 (ref 5–15)
BUN: 33 mg/dL — AB (ref 6–23)
CHLORIDE: 106 meq/L (ref 96–112)
CO2: 19 mEq/L (ref 19–32)
Calcium: 8.4 mg/dL (ref 8.4–10.5)
Creatinine, Ser: 1.8 mg/dL — ABNORMAL HIGH (ref 0.50–1.10)
GFR calc Af Amer: 28 mL/min — ABNORMAL LOW (ref >90)
GFR, EST NON AFRICAN AMERICAN: 24 mL/min — AB (ref >90)
Glucose, Bld: 95 mg/dL (ref 70–99)
Potassium: 4.4 mEq/L (ref 3.7–5.3)
Sodium: 139 mEq/L (ref 137–147)

## 2014-06-23 LAB — CBC
HEMATOCRIT: 36.4 % (ref 36.0–46.0)
HEMOGLOBIN: 11.6 g/dL — AB (ref 12.0–15.0)
MCH: 29 pg (ref 26.0–34.0)
MCHC: 31.9 g/dL (ref 30.0–36.0)
MCV: 91 fL (ref 78.0–100.0)
Platelets: 263 10*3/uL (ref 150–400)
RBC: 4 MIL/uL (ref 3.87–5.11)
RDW: 14.4 % (ref 11.5–15.5)
WBC: 6.6 10*3/uL (ref 4.0–10.5)

## 2014-06-23 LAB — SEDIMENTATION RATE: Sed Rate: 21 mm/hr (ref 0–22)

## 2014-06-23 LAB — MRSA PCR SCREENING: MRSA BY PCR: NEGATIVE

## 2014-06-23 MED ORDER — CHLORHEXIDINE GLUCONATE CLOTH 2 % EX PADS
6.0000 | MEDICATED_PAD | Freq: Once | CUTANEOUS | Status: AC
  Administered 2014-06-23: 6 via TOPICAL

## 2014-06-23 MED ORDER — ENSURE COMPLETE PO LIQD
237.0000 mL | Freq: Two times a day (BID) | ORAL | Status: DC
Start: 2014-06-23 — End: 2014-06-25
  Administered 2014-06-23 – 2014-06-25 (×4): 237 mL via ORAL

## 2014-06-23 MED ORDER — ISOSORBIDE MONONITRATE ER 60 MG PO TB24
60.0000 mg | ORAL_TABLET | Freq: Every day | ORAL | Status: DC
  Administered 2014-06-23 – 2014-06-25 (×3): 60 mg via ORAL
  Filled 2014-06-23 (×3): qty 1

## 2014-06-23 NOTE — Consult Note (Signed)
Physical Medicine and Rehabilitation Consult Reason for Consult:Syncope Referring Physician: Dr. Clelia CroftShaw  HPI: Nicole BridgemanDorothy M Harding is a 80 y.o.right handed female admitted with history of hypertension,atypical Parkinson's disease,pulmonary emboli with IVC filter, chronic renal insufficiency with baseline creatinine 2.10,COPD with oxygen at night. Patient independent prior to admission without assistive device and resides at independent living [facility]. Presented 06/20/2014 feelings of being lightheaded. No nausea or vomiting. Noted elevated systolic blood pressure of 200.Carotid Dopplers with no ICA stenosis.Echocardiogram with ejection fraction of 65% grade 1 diastolic dysfunction.Cardiac enzymes were negative. Follow-up close management of hypertension with noted bouts of hypotension.subcutaneous Lovenox for DVT prophylaxis.physical therapy evaluation completed 06/21/2014 with recommendations of physical medicine rehabilitation consult.   Review of Systems  Constitutional: Positive for malaise/fatigue.  Respiratory: Positive for shortness of breath.   Musculoskeletal: Positive for myalgias, back pain and falls.  Neurological: Positive for dizziness and weakness.  All other systems reviewed and are negative.  Past Medical History  Diagnosis Date  . Hypertension   . History of orthostatic hypotension   . IBS (irritable bowel syndrome)   . Parkinson's disease     ATYPICAL  . Dizziness   . Vertigo   . Fatigue   . Advanced age   . HLD (hyperlipidemia)     intolerant to lipid lowering drugs  . Normal nuclear stress test 2003  . Fall at home 05/2012    mechanical fall  (06/01/2012)  . Pulmonary embolism 05/31/2012    off coumadin due to fall/SDH; has filter in place  . Chronic kidney disease, stage 4 (severe)   . Exertional dyspnea   . Arthritis     "legs, fingers" (06/01/2012)  . Subdural hematoma    Past Surgical History  Procedure Laterality Date  . Small intestine  surgery    . Hemorrhoid surgery  1970    "have4 had accidents ever since" (06/01/2012)  . Abdominal hysterectomy  1970's  . Tonsillectomy  1954  . Cholecystectomy  1955?  Marland Kitchen. Breast cyst excision  1970's    left  . Cataract extraction w/ intraocular lens  implant, bilateral  ? 1990's  . Shoulder open rotator cuff repair  1990?    right  . Appendectomy    . Joint replacement    . Eye surgery     Family History  Problem Relation Age of Onset  . Stroke Father   . Emphysema Brother     smoked  . Heart disease      siblings   Social History:  reports that Nicole Harding quit smoking about 43 years ago. Her smoking use included Cigarettes. Nicole Harding has a 20 pack-year smoking history. Nicole Harding has never used smokeless tobacco. Nicole Harding reports that Nicole Harding does not drink alcohol or use illicit drugs. Allergies:  Allergies  Allergen Reactions  . Amlodipine Nausea And Vomiting and Swelling    Swelling --sick and ended up in hospital N&V  . Codeine Nausea And Vomiting  . Lipitor [Atorvastatin Calcium] Other (See Comments)    "cramps my legs"  . Lisinopril Other (See Comments)    unknown  . Prozac [Fluoxetine Hcl] Nausea And Vomiting    syncope  . Red Yeast Rice Other (See Comments)    unknown  . Zetia [Ezetimibe] Other (See Comments)    unknown  . Zoloft [Sertraline Hcl]     Extreme weakness   Medications Prior to Admission  Medication Sig Dispense Refill  . citric acid-sodium citrate (ORACIT) 334-500 MG/5ML solution Take 15 mLs by mouth  daily.    . famotidine (PEPCID) 20 MG tablet Take 20 mg by mouth at bedtime.     . hydrALAZINE (APRESOLINE) 25 MG tablet Take 25 mg by mouth 4 (four) times daily.    . isosorbide mononitrate (IMDUR) 30 MG 24 hr tablet TAKE ONE TABLET EACH DAY 30 tablet 6  . metoprolol succinate (TOPROL-XL) 25 MG 24 hr tablet Take 25 mg by mouth See admin instructions. Take 1 tablet by mouth twice daily, as scheduled. If blood pressure exceeds 150, take additional tablet    . Multiple  Vitamins-Minerals (CENTRUM SILVER PO) Take 1 tablet by mouth daily.    . Multiple Vitamins-Minerals (HAIR/SKIN/NAILS PO) Take 1 tablet by mouth daily.    . pantoprazole (PROTONIX) 40 MG tablet Take 1 tablet (40 mg total) by mouth daily. Take 30-60 min before first meal of the day 30 tablet 2  . polyethylene glycol powder (MIRALAX) powder Take 17 g by mouth daily as needed for mild constipation.     Marland Kitchen azithromycin (ZITHROMAX) 250 MG tablet Take 2 tabs PO x 1 dose, then 1 tab PO QD x 4 days 6 tablet 0  . cephALEXin (KEFLEX) 500 MG capsule Take 1 capsule (500 mg total) by mouth 2 (two) times daily. 20 capsule 0  . famotidine (PEPCID) 20 MG tablet TAKE ONE TABLET AT BEDTIME (Patient not taking: Reported on 06/20/2014) 30 tablet 5  . hydrALAZINE (APRESOLINE) 25 MG tablet TAKE ONE TABLET FOUR TIMES DAILY (Patient not taking: Reported on 06/20/2014) 360 tablet 0  . metoprolol succinate (TOPROL-XL) 25 MG 24 hr tablet TAKE ONE TABLET TWICE DAILY -IF BLOOD PRESSURE GOES ABOVE 150-TAKE ANEXTRA PILL (Patient not taking: Reported on 06/20/2014) 60 tablet 3    Home: Home Living Family/patient expects to be discharged to:: Unsure (Lives at [facility]) Living Arrangements: Alone Available Help at Discharge: Other (Comment) (Independent living facility) Type of Home: Independent living facility Home Access: Level entry Home Layout: One level Home Equipment: Environmental consultant - 4 wheels, Cane - single point, Banker History: Prior Function Level of Independence: Needs assistance Gait / Transfers Assistance Needed: does not use rollator ADL's / Homemaking Assistance Needed: bath/dress herself - Food provided by living facility Functional Status:  Mobility: Bed Mobility Overal bed mobility: Needs Assistance Bed Mobility: Supine to Sit, Sit to Supine Supine to sit: Min guard Sit to supine: Min guard General bed mobility comments: Close guard assist for safety. Use of bed rail to  rise. Able to complete without physical assist. VC for sequencing throughout task. Good ability to scoot self towards head of bed. Transfers Overall transfer level: Needs assistance Equipment used: None, Rolling walker (2 wheeled) Transfers: Sit to/from Stand Sit to Stand: Min assist General transfer comment: Pt with adequate LE strength to rise however truncal ataxia, almost ballistic type movements once standing. Easily corrected with support through hands on a rolling walker, however when attempted without assistive device pt requires physical assist to maintain standing. Nicole Harding was able to stand for 2 minutes with a rolling walker for support but required to sit due to BP as indicated in orthostatic vitals section. Pt without significant complaint of dizziness.      ADL:    Cognition: Cognition Overall Cognitive Status: Within Functional Limits for tasks assessed Orientation Level: Oriented X4 Cognition Arousal/Alertness: Awake/alert Behavior During Therapy: WFL for tasks assessed/performed Overall Cognitive Status: Within Functional Limits for tasks assessed  Blood pressure 185/78, pulse 69, temperature 98.2 F (36.8 C), temperature  source Oral, resp. rate 18, height 5\' 6"  (1.676 m), weight 65.8 kg (145 lb 1 oz), SpO2 97 %. Physical Exam  Constitutional: Nicole Harding is oriented to person, place, and time.  HENT:  Head: Normocephalic.  Eyes: EOM are normal.  Neck: Normal range of motion. Neck supple. No thyromegaly present.  Cardiovascular: Normal rate and regular rhythm.   Respiratory: Effort normal and breath sounds normal. No respiratory distress.  GI: Soft. Bowel sounds are normal. Nicole Harding exhibits no distension.  Neurological: Nicole Harding is alert and oriented to person, place, and time.  Limited use of LUE due to pain. Decreased fmc. No tremor. Generalized weakness. No sensory loss.  Skin: Skin is warm and dry.  Psychiatric:  Frequently tearful when remembering husband    Results for  orders placed or performed during the hospital encounter of 06/20/14 (from the past 24 hour(s))  Basic metabolic panel     Status: Abnormal   Collection Time: 06/23/14  4:18 AM  Result Value Ref Range   Sodium 139 137 - 147 mEq/L   Potassium 4.4 3.7 - 5.3 mEq/L   Chloride 106 96 - 112 mEq/L   CO2 19 19 - 32 mEq/L   Glucose, Bld 95 70 - 99 mg/dL   BUN 33 (H) 6 - 23 mg/dL   Creatinine, Ser 1.61 (H) 0.50 - 1.10 mg/dL   Calcium 8.4 8.4 - 09.6 mg/dL   GFR calc non Af Amer 24 (L) >90 mL/min   GFR calc Af Amer 28 (L) >90 mL/min   Anion gap 14 5 - 15  CBC     Status: Abnormal   Collection Time: 06/23/14  4:18 AM  Result Value Ref Range   WBC 6.6 4.0 - 10.5 K/uL   RBC 4.00 3.87 - 5.11 MIL/uL   Hemoglobin 11.6 (L) 12.0 - 15.0 g/dL   HCT 04.5 40.9 - 81.1 %   MCV 91.0 78.0 - 100.0 fL   MCH 29.0 26.0 - 34.0 pg   MCHC 31.9 30.0 - 36.0 g/dL   RDW 91.4 78.2 - 95.6 %   Platelets 263 150 - 400 K/uL   No results found.  Assessment/Plan: Diagnosis: chronic gait disorder, admitted with recent pre-syncopal episode 1. Does the need for close, 24 hr/day medical supervision in concert with the patient's rehab needs make it unreasonable for this patient to be served in a less intensive setting? No 2. Co-Morbidities requiring supervision/potential complications: ckd, recurrent falls 3. Due to bladder management, skin/wound care, disease management and medication administration, does the patient require 24 hr/day rehab nursing? No 4. Does the patient require coordinated care of a physician, rehab nurse, PT (1-2 hrs/day, 5 days/week) and OT (1-2 hrs/day, 5 days/week) to address physical and functional deficits in the context of the above medical diagnosis(es)? No Addressing deficits in the following areas: balance, endurance, locomotion and strength 5. Can the patient actively participate in an intensive therapy program of at least 3 hrs of therapy per day at least 5 days per week? No 6. The potential for  patient to make measurable gains while on inpatient rehab is fair 7. Anticipated functional outcomes upon discharge from inpatient rehab are n/a  with PT, n/a with OT, n/a with SLP. 8. Estimated rehab length of stay to reach the above functional goals is: n/a 9. Does the patient have adequate social supports and living environment to accommodate these discharge functional goals? N/A 10. Anticipated D/C setting: Other 11. Anticipated post D/C treatments: HH therapy 12. Overall Rehab/Functional Prognosis: good  and fair  RECOMMENDATIONS: This patient's condition is appropriate for continued rehabilitative care in the following setting: SNF or ALF  Patient has agreed to participate in recommended program. Potentially Note that insurance prior authorization may be required for reimbursement for recommended care.  Comment: Really nothing medically to justify her stay here. Nicole Harding has chronic rotator cuff problems in addition to her hx of falls which further complicate things.   Ranelle OysterZachary T. Takasha Vetere, MD, [facility]FAAPMR [facility] Physical Medicine & Rehabilitation 06/23/2014     06/23/2014

## 2014-06-23 NOTE — Progress Notes (Signed)
CSW (Clinical Child psychotherapistocial Worker) spoke with pt and son-in-law Amada JupiterDale [phone]) at bedside. CSW updated Amada JupiterDale on previous conversation between CSW and pt. After speaking further, pt and Amada JupiterDale decided their [facility] would [facility]. Should [facility] not offer a bed, [facility] second choice but they would want to be provided with bed offers to confirm.  CSW also received call from pt daughter Lupita LeashDonna. CSW updated Lupita LeashDonna on current plan. She is also in agreement that pt will need rehab at dc.  Tekesha Almgren, LCSWA [phone]

## 2014-06-23 NOTE — Progress Notes (Signed)
Physical Therapy Treatment Patient Details Name: Nicole Harding MRN: [id number] DOB: November 19, 1923 Today's Date: 06/23/2014    History of Present Illness Nicole Harding is an 80 year old white female with a history of hypertension, chronic renal insufficiency, and Parkinson's who presented to the emergency department with the complaint of feeling faint. Patient states that she's been feeling poorly for the past several days.  Despite this, went to the beauty parlor this morning and when she returned she felt like she might pass out. She denies any vertigo symptoms but felt lightheaded. No nausea or vomiting.She called EMS who checked her blood pressure and it was running above 200.  They gave her an extra dose of her metoprolol with minimal improvement and then brought her to the emergency department where her blood pressure was 208/73.  She has a history of chronic lung disease on home oxygen at night.  She denies any significant increase in shortness of breath recently but does describe chronic dyspnea on exertion. She has had significant left shoulder and chest wall pain recently. The pain is worse with a deep breath. She had an cortisone injection into her left shoulder 2 days ago with minimal improvement    PT Comments    Patient is progressing and able to ambulate in hallway this session. Patient limited by overall fatigue. Unsure if patient will be accepted to CIR and if not, she would benefit from continued therapy at SNF. Will continue with current POC  Follow Up Recommendations  CIR;Supervision for mobility/OOB     Equipment Recommendations       Recommendations for Other Services       Precautions / Restrictions Precautions Precautions: Fall Precaution Comments: spot check O2 Restrictions Weight Bearing Restrictions: No    Mobility  Bed Mobility Overal bed mobility: Needs Assistance Bed Mobility: Sit to Supine       Sit to supine: Supervision   General bed mobility  comments: Cues for positioning prior to sittnig back onto bed  Transfers Overall transfer level: Needs assistance Equipment used: Rolling walker (2 wheeled) Transfers: Sit to/from UGI CorporationStand;Stand Pivot Transfers Sit to Stand: Min assist Stand pivot transfers: Min assist       General transfer comment: A to power up into standing. Cues for hand placement with use of RW  Ambulation/Gait Ambulation/Gait assistance: Min guard Ambulation Distance (Feet): 70 Feet Assistive device: Rolling walker (2 wheeled) Gait Pattern/deviations: Step-through pattern;Decreased stride length;Trunk flexed   Gait velocity interpretation: Below normal speed for age/gender General Gait Details: Cues for posture and safety with RW. Limited by fatigue   Stairs            Wheelchair Mobility    Modified Rankin (Stroke Patients Only)       Balance                                    Cognition Arousal/Alertness: Awake/alert Behavior During Therapy: WFL for tasks assessed/performed Overall Cognitive Status: Within Functional Limits for tasks assessed                      Exercises      General Comments        Pertinent Vitals/Pain Pain Assessment: No/denies pain    Home Living Family/patient expects to be discharged to:: Inpatient rehab Living Arrangements: Alone Available Help at Discharge: Other (Comment) (Independent living facility) Type of Home: Independent living facility Home  Access: Level entry   Home Layout: One level Home Equipment: Walker - 4 wheels;Cane - single point Additional Comments: pt has walk in shower    Prior Function Level of Independence: Needs assistance  Gait / Transfers Assistance Needed: does not use rollator ADL's / Homemaking Assistance Needed: food provided by facility      PT Goals (current goals can now be found in the care plan section) Acute Rehab PT Goals Patient Stated Goal: not stated Progress towards PT goals:  Progressing toward goals    Frequency  Min 3X/week    PT Plan Current plan remains appropriate    Co-evaluation             End of Session Equipment Utilized During Treatment: Gait belt Activity Tolerance: Patient tolerated treatment well Patient left: in bed;with call bell/phone within reach     Time: 1415-1441 PT Time Calculation (min) (ACUTE ONLY): 26 min  Charges:  $Gait Training: 8-22 mins $Therapeutic Activity: 8-22 mins                    G Codes:      Fredrich BirksRobinette, Less Woolsey Elizabeth 06/23/2014, 2:49 PM 06/23/2014 Fredrich Birksobinette, Ngan Qualls Elizabeth PTA [phone] pager [phone] office

## 2014-06-23 NOTE — Care Management Note (Signed)
    Page 1 of 1   06/23/2014     3:43:32 PM CARE MANAGEMENT NOTE 06/23/2014  Patient:  Nicole Harding,Nicole Harding   Account Number:  [id number]  Date Initiated:  06/23/2014  Documentation initiated by:  GRAVES-BIGELOW,Aayliah Rotenberry  Subjective/Objective Assessment:   Pt admitted from [facility] for increased bp.     Action/Plan:   CM to continue to monitor for disposition needs. CSW to assist with SNF placement.   Anticipated DC Date:  06/25/2014   Anticipated DC Plan:  SKILLED NURSING FACILITY  In-house referral  Clinical Social Worker      DC Planning Services  CM consult      Choice offered to / List presented to:             Status of service:  Completed, signed off Medicare Important Message given?  YES (If response is "NO", the following Medicare IM given date fields will be blank) Date Medicare IM given:  06/23/2014 Medicare IM given by:  GRAVES-BIGELOW,Katye Valek Date Additional Medicare IM given:   Additional Medicare IM given by:    Discharge Disposition:  SKILLED NURSING FACILITY  Per UR Regulation:  Reviewed for med. necessity/level of care/duration of stay  If discussed at Long Length of Stay Meetings, dates discussed:    Comments:

## 2014-06-23 NOTE — Evaluation (Signed)
Occupational Therapy Evaluation Patient Details Name: Nigel BridgemanDorothy M Bertram MRN: [id number] DOB: [birth year] Today's Date: 06/23/2014    History of Present Illness [name] is an 80 year old white female with a history of hypertension, chronic renal insufficiency, and Parkinson's who presented to the emergency department with the complaint of feeling faint. Patient states that she's been feeling poorly for the past several days.  Despite this, went to the beauty parlor this morning and when she returned she felt like she might pass out. She denies any vertigo symptoms but felt lightheaded. No nausea or vomiting.She called EMS who checked her blood pressure and it was running above 200.  They gave her an extra dose of her metoprolol with minimal improvement and then brought her to the emergency department where her blood pressure was 208/73.  She has a history of chronic lung disease on home oxygen at night.  She denies any significant increase in shortness of breath recently but does describe chronic dyspnea on exertion. She has had significant left shoulder and chest wall pain recently. The pain is worse with a deep breath. She had an cortisone injection into her left shoulder 2 days ago with minimal improvement   Clinical Impression   Pt admitted with above. Pt independent with ADLs, PTA. Feel pt will benefit from acute OT to increase independence with BADLs, PTA. Pt with fluctuating BP in session. Recommending CIR for rehab-pt motivated to get better.    Follow Up Recommendations  CIR;Supervision/Assistance - 24 hour    Equipment Recommendations  3 in 1 bedside comode    Recommendations for Other Services       Precautions / Restrictions Precautions Precautions: Fall Restrictions Weight Bearing Restrictions: No      Mobility Bed Mobility Overal bed mobility: Needs Assistance Bed Mobility: Sit to Supine       Sit to supine: Min guard   General bed mobility comments: cues  and assist to scoot HOB.   Transfers Overall transfer level: Needs assistance Equipment used: Rolling walker (2 wheeled) Transfers: Sit to/from [employer]GI CorporationStand;Stand Pivot Transfers Sit to Stand: Mod assist;Min assist;Min guard (Min guard for stand to sit on bed) Stand pivot transfers: Min assist       General transfer comment: Cues for technique.     Balance                                            ADL Overall ADL's : Needs assistance/impaired     Grooming: Oral care;Min guard;Standing (wiped off mouth in sitting position)           Upper Body Dressing : Minimal assistance;Sitting   Lower Body Dressing: Minimal assistance;Sit to/from stand   Toilet Transfer: Minimal assistance;RW;BSC;Stand-pivot   Toileting- Clothing Manipulation and Hygiene: Minimal assistance;Sit to/from stand       Functional mobility during ADLs: Minimal assistance;Moderate assistance;Rolling walker General ADL Comments: Explained CIR to pt for rehab option and if not accepted next option would be SNF. Educated on UB dressing technique. Pt had BM on BSC. Nurse came in at end of session and told her pt's BPs. Pt stood for a few minutes for BP to be taken at beginning of session.      Vision                     Perception     Praxis  Pertinent Vitals/Pain Pain Assessment: No/denies pain (at end of session); BP 174/75 sitting at beginning of session; 155/101 sitting on BSC ([facility] 181/77 supine in bed at end of session. *BP taken after standing at beginning of session but was deleted off machine so could not retrieve (BP did drop from sitting to standing).     Hand Dominance Left   Extremity/Trunk Assessment Upper Extremity Assessment Upper Extremity Assessment: LUE deficits/detail LUE Deficits / Details: pt able to perform PROM left shoulder flexion but limited AROM of left shoulder   Lower Extremity Assessment Lower Extremity Assessment: Defer to PT  evaluation       Communication Communication Communication: HOH   Cognition Arousal/Alertness: Awake/alert Behavior During Therapy: WFL for tasks assessed/performed Overall Cognitive Status: Within Functional Limits for tasks assessed                     General Comments       Exercises       Shoulder Instructions      Home Living Family/patient expects to be discharged to:: Inpatient rehab Living Arrangements: Alone Available Help at Discharge: Other (Comment) (Independent living facility) Type of Home: Independent living facility Home Access: Level entry     Home Layout: One level               Home Equipment: Walker - 4 wheels;Cane - single point   Additional Comments: pt has walk in shower      Prior Functioning/Environment Level of Independence: Needs assistance  Gait / Transfers Assistance Needed: does not use rollator ADL's / Homemaking Assistance Needed: food provided by facility         OT Diagnosis: Acute pain;Generalized weakness   OT Problem List: Decreased strength;Decreased knowledge of use of DME or AE;Decreased knowledge of precautions;Pain;Impaired UE functional use;Decreased activity tolerance;Impaired balance (sitting and/or standing)   OT Treatment/Interventions: Self-care/ADL training;DME and/or AE instruction;Therapeutic activities;Patient/family education;Balance training    OT Goals(Current goals can be found in the care plan section) Acute Rehab OT Goals Patient Stated Goal: not stated OT Goal Formulation: With patient Time For Goal Achievement: 06/30/14 Potential to Achieve Goals: Good ADL Goals Pt Will Perform Lower Body Bathing: with supervision;sit to/from stand Pt Will Perform Upper Body Dressing: sitting;with set-up Pt Will Perform Lower Body Dressing: with supervision;sit to/from stand Pt Will Transfer to Toilet: ambulating;with supervision Pt Will Perform Toileting - Clothing Manipulation and hygiene: with  modified independence;sit to/from stand  OT Frequency: Min 2X/week   Barriers to D/C:            Co-evaluation              End of Session Equipment Utilized During Treatment: Gait belt;Rolling walker Nurse Communication: Other (comment);Mobility status (BP )  Activity Tolerance: Patient tolerated treatment well;Other (comment) (lightheaded) Patient left: in bed;with call bell/phone within reach   Time: [id number] OT Time Calculation (min): 38 min Charges:  OT General Charges $OT Visit: 1 Procedure OT Evaluation $Initial OT Evaluation Tier I: 1 Procedure OT Treatments $Self Care/Home Management : 8-22 mins G-CodesEarlie Raveling:    Cache Decoursey L OTR/L [phone] 06/23/2014, 1:42 PM

## 2014-06-23 NOTE — Clinical Social Work Placement (Addendum)
    Clinical Social Work Department CLINICAL SOCIAL WORK PLACEMENT NOTE 06/23/2014  Patient:  Nicole Harding,Nicole M  Account Number:  [id number] Admit date:  06/20/2014  Clinical Social Worker:  Harless NakayamaPOONUM [name], LCSWA  Date/time:  06/23/2014 02:43 PM  Clinical Social Work is seeking post-discharge placement for this patient at the following level of care:   SKILLED NURSING   (*CSW will update this form in Epic as items are completed)   06/23/2014  Patient/family provided with [facility] of Clinical Social Work's list of facilities offering this level of care within the geographic area requested by the patient (or if unable, by the patient's family).  06/23/2014  Patient/family informed of their freedom to choose among providers that offer the needed level of care, that participate in Medicare, Medicaid or managed care program needed by the patient, have an available bed and are willing to accept the patient.  06/23/2014  Patient/family informed of [facility]' ownership interest in [facility], as well as of the fact that they are under no obligation to receive care at this facility.  PASARR submitted to EDS on existing PASARR number received on   FL2 transmitted to all facilities in geographic area requested by pt/family on  06/23/2014 FL2 transmitted to all facilities within larger geographic area on   Patient informed that his/her managed care company has contracts with or will negotiate with  certain facilities, including the following:     Patient/family informed of bed offers received:  06/23/2014 [facility] recommends and patient chooses bed [facility]    [facility]  06/25/2014 Patient to be transferred to facility by [facility] Patient and family notified of transfer on 06/25/2014 Name of family member notified:  [name] (son-in-law)  The following physician request were entered in Epic: Physician  Request  Please sign FL2.    Additional CommentsSharol Harness:   [name], LCSWA [phone]

## 2014-06-23 NOTE — Progress Notes (Signed)
Rehab admissions - Evaluated for possible admission.  Please see rehab MD consult done by Dr. Riley KillSwartz today recommending SNF versus ALF.  Patient lacks the medical necessity to justify an inpatient rehab admission.  Call me for questions.  #[phone]

## 2014-06-23 NOTE — Progress Notes (Signed)
Subjective: No events overnight  Objective: Vital signs in last 24 hours: Temp:  [97.5 F (36.4 C)-98.2 F (36.8 C)] 98.2 F (36.8 C) (11/16 0516) Pulse Rate:  [69-93] 69 (11/16 0516) Resp:  [17-18] 18 (11/16 0516) BP: (181-198)/(64-94) 185/78 mmHg (11/16 0516) SpO2:  [96 %-99 %] 97 % (11/16 0516) Weight:  [145 lb 1 oz (65.8 kg)] 145 lb 1 oz (65.8 kg) (11/16 0516) Weight change: 1 oz (0.028 kg) Last BM Date: 06/20/14  Orthostatics: 180/74 62 lying-->183/92 70 sitting-->165/77 81 standing  CBG (last 3)  No results for input(s): GLUCAP in the last 72 hours.  Intake/Output from previous day: 11/15 0701 - 11/16 0700 In: 600 [P.O.:600] Out: 1500 [Urine:1500] Intake/Output this shift:    General appearance: frail, chronically ill Eyes: no scleral icterus Throat: oropharynx moist without erythema Resp: clear to auscultation bilaterally Cardio: regular rate and rhythm GI: soft, non-tender; bowel sounds normal; no masses,  no organomegaly Extremities: no clubbing, cyanosis or edema   Lab Results:  Recent Labs  06/22/14 0315 06/23/14 0418  NA 137 139  K 4.3 4.4  CL 104 106  CO2 18* 19  GLUCOSE 93 95  BUN 39* 33*  CREATININE 2.07* 1.80*  CALCIUM 8.5 8.4   No results for input(s): AST, ALT, ALKPHOS, BILITOT, PROT, ALBUMIN in the last 72 hours.  Recent Labs  06/20/14 1544  06/22/14 0315 06/23/14 0418  WBC 10.7*  < > 7.1 6.6  NEUTROABS 8.6*  --   --   --   HGB 13.4  < > 12.2 11.6*  HCT 42.6  < > 38.0 36.4  MCV 88.9  < > 91.1 91.0  PLT 328  < > 262 263  < > = values in this interval not displayed. Lab Results  Component Value Date   INR 1.24 06/20/2012   INR 1.21 06/19/2012   INR 1.48 06/19/2012    Recent Labs  06/20/14 2316 06/21/14 0322 06/21/14 0855  TROPONINI <0.30 <0.30 <0.30    Recent Labs  06/20/14 2316  TSH 2.220   No results for input(s): VITAMINB12, FOLATE, FERRITIN, TIBC, IRON, RETICCTPCT in the last 72 hours.  Studies/Results: No  results found. Carotid Dopplers: Preliminary report: 1-39% ICA stenosis. Vertebral artery flow is antegrade.   Echo- not done yet  Medications: Scheduled: . citric acid-sodium citrate  15 mL Oral Daily  . enoxaparin (LOVENOX) injection  30 mg Subcutaneous Q24H  . hydrALAZINE  25 mg Oral 4 times per day  . isosorbide mononitrate  60 mg Oral Daily  . metoprolol succinate  25 mg Oral Daily  . multivitamin with minerals  1 tablet Oral Daily  . pantoprazole  40 mg Oral Daily  . pneumococcal 23 valent vaccine  0.5 mL Intramuscular Tomorrow-1000  . sodium chloride  3 mL Intravenous Q12H   Continuous: . sodium chloride 75 mL/hr (06/22/14 1045)    Assessment/Plan: Principal Problem: 1. Near syncope- orthostasis being treated w/ gentle IVF.  Carotid dopplers relatively normal.moderate pulmonary HTN shown on TTE may be contributing, pulmonary consulted.   Active Problems: 2. Malignant hypertension-remains uncontrolled. Increasing her imdur today from 30mg  qd to 60mg  qd for afterload reduction  3. CKD (chronic kidney disease) stage 4, GFR 15-29 ml/min- gentle hydration. improving 4. Chronic respiratory failure/Oxygen dependent/pulmonary HTN- chronic hypoxia of unclear etiology, worked up as outpatient by Dr Sherene SiresWert. TTE now showing newly diagnosed pulmonary HTN. Consulting pulmonary today for input. Prior V/Q and cardiac stress tests negative in past. PFTs normal in past. Her  chronic condition remains w/o clear explanation.  5. Disposition- continue PT/OT for generalized weakness. OOB to chair. CIR consult pending.  Possible discharge in 1-2 days    LOS: 3 days   Malachi Kinzler 06/23/2014, 7:17 AM

## 2014-06-23 NOTE — Consult Note (Signed)
Name: Nicole Harding MRN: [id number] DOB: [birth year]    ADMISSION DATE:  06/20/2014 CONSULTATION DATE: 11/16  REFERRING MD :  Clelia Croft  CHIEF COMPLAINT: Dizzy  BRIEF PATIENT DESCRIPTION: 80 yo female in NAD  SIGNIFICANT EVENTS    STUDIES:     HISTORY OF PRESENT ILLNESS:   80 yo WF , remote smoker who quit in 1952, seen by Dr. Sherene Sires 11/14 for unexplained hypoxia(no reason found) who presented to [facility] ED 11/13 after a near syncopal episode and found to have SBP > 200 and was admitted for further evaluation and treatment. CxR was remarkable for right hilar ovoid density not appreciated on previous c x r. She had a 2 D echo on 11/15 which revealed moderate Pulmonary HTN and PCCM asked to evaluate. She has not had any new respiratory issues, her major complaints are left shoulder pain and had recent cortisone injection. Her hypertension is poorly controlled as noted.  PAST MEDICAL HISTORY :   has a past medical history of Hypertension; History of orthostatic hypotension; IBS (irritable bowel syndrome); Parkinson's disease; Dizziness; Vertigo; Fatigue; Advanced age; HLD (hyperlipidemia); Normal nuclear stress test (2003); Fall at home (05/2012); Pulmonary embolism (05/31/2012); Chronic kidney disease, stage 4 (severe); Exertional dyspnea; Arthritis; and Subdural hematoma.  has past surgical history that includes Small intestine surgery; Hemorrhoid surgery (1970); Abdominal hysterectomy (1970's); Tonsillectomy (1954); Cholecystectomy (1955?); Breast cyst excision (1970's); Cataract extraction w/ intraocular lens  implant, bilateral (? 1990's); Shoulder open rotator cuff repair (1990?); Appendectomy; Joint replacement; and Eye surgery. Prior to Admission medications   Medication Sig Start Date End Date Taking? Authorizing Provider  citric acid-sodium citrate (ORACIT) 334-500 MG/5ML solution Take 15 mLs by mouth daily.   Yes Historical Provider, MD  famotidine (PEPCID) 20 MG tablet Take 20 mg  by mouth at bedtime.    Yes Historical Provider, MD  hydrALAZINE (APRESOLINE) 25 MG tablet Take 25 mg by mouth 4 (four) times daily.   Yes Historical Provider, MD  isosorbide mononitrate (IMDUR) 30 MG 24 hr tablet TAKE ONE TABLET EACH DAY   Yes [name], MD  metoprolol succinate (TOPROL-XL) 25 MG 24 hr tablet Take 25 mg by mouth See admin instructions. Take 1 tablet by mouth twice daily, as scheduled. If blood pressure exceeds 150, take additional tablet   Yes Historical Provider, MD  Multiple Vitamins-Minerals (CENTRUM SILVER PO) Take 1 tablet by mouth daily.   Yes Historical Provider, MD  Multiple Vitamins-Minerals (HAIR/SKIN/NAILS PO) Take 1 tablet by mouth daily.   Yes Historical Provider, MD  pantoprazole (PROTONIX) 40 MG tablet Take 1 tablet (40 mg total) by mouth daily. Take 30-60 min before first meal of the day 06/21/13  Yes Nyoka Cowden, MD  polyethylene glycol powder (MIRALAX) powder Take 17 g by mouth daily as needed for mild constipation.    Yes Historical Provider, MD  azithromycin (ZITHROMAX) 250 MG tablet Take 2 tabs PO x 1 dose, then 1 tab PO QD x 4 days 01/05/14   Carmelina Dane, MD  cephALEXin (KEFLEX) 500 MG capsule Take 1 capsule (500 mg total) by mouth 2 (two) times daily. 02/12/14   Shade Flood, MD  famotidine (PEPCID) 20 MG tablet TAKE ONE TABLET AT BEDTIME Patient not taking: Reported on 06/20/2014 01/14/14   Nyoka Cowden, MD  hydrALAZINE (APRESOLINE) 25 MG tablet TAKE ONE TABLET FOUR TIMES DAILY Patient not taking: Reported on 06/20/2014    [name], MD  metoprolol succinate (TOPROL-XL) 25 MG 24 hr tablet TAKE  ONE TABLET TWICE DAILY -IF BLOOD PRESSURE GOES ABOVE 150-TAKE ANEXTRA PILL Patient not taking: Reported on 06/20/2014    Cassell Clementhomas Brackbill, MD   Allergies  Allergen Reactions  . Amlodipine Nausea And Vomiting and Swelling    Swelling --sick and ended up in hospital N&V  . Codeine Nausea And Vomiting  . Lipitor [Atorvastatin Calcium] Other  (See Comments)    "cramps my legs"  . Lisinopril Other (See Comments)    unknown  . Prozac [Fluoxetine Hcl] Nausea And Vomiting    syncope  . Red Yeast Rice Other (See Comments)    unknown  . Zetia [Ezetimibe] Other (See Comments)    unknown  . Zoloft [Sertraline Hcl]     Extreme weakness    FAMILY HISTORY:  family history includes Emphysema in her brother; Heart disease in an other family member; Stroke in her father. SOCIAL HISTORY:  reports that she quit smoking about 43 years ago. Her smoking use included Cigarettes. She has a 20 pack-year smoking history. She has never used smokeless tobacco. She reports that she does not drink alcohol or use illicit drugs.  REVIEW OF SYSTEMS:   .smsros  SUBJECTIVE:  NAD at rest VITAL SIGNS: Temp:  [97.5 F (36.4 C)-98.2 F (36.8 C)] 98.2 F (36.8 C) (11/16 0516) Pulse Rate:  [69-93] 69 (11/16 0516) Resp:  [18] 18 (11/16 0516) BP: (181-198)/(64-94) 185/78 mmHg (11/16 0516) SpO2:  [96 %-97 %] 97 % (11/16 0516) Weight:  [145 lb 1 oz (65.8 kg)] 145 lb 1 oz (65.8 kg) (11/16 0516)  PHYSICAL EXAMINATION: General:  [name] , sitting in chair without obvious distress. Neuro: Appears intact, mae x 4 to command HEENT: No JVD/LAN Cardiovascular: HSR RRR Lungs:  Decreased breath sounds through out.  Abdomen:  Soft thin + BS Musculoskeletal:  Intact Skin:  Warm and dry   Recent Labs Lab 06/21/14 0322 06/22/14 0315 06/23/14 0418  NA 140 137 139  K 4.9 4.3 4.4  CL 107 104 106  CO2 18* 18* 19  BUN 40* 39* 33*  CREATININE 2.10* 2.07* 1.80*  GLUCOSE 91 93 95    Recent Labs Lab 06/21/14 0322 06/22/14 0315 06/23/14 0418  HGB 12.6 12.2 11.6*  HCT 38.7 38.0 36.4  WBC 9.5 7.1 6.6  PLT 293 262 263   No results found. Principal Problem:   Near syncope Active Problems:   CKD (chronic kidney disease) stage 4, GFR 15-29 ml/min   Chronic respiratory failure   Malignant hypertension   Oxygen dependent   ASSESSMENT / PLAN: [name] , remote smoker who quit in 1952, seen by Dr. Sherene SiresWert 11/14 for unexplained hypoxia(no reason found) who [facility] after a near syncopal episode and found to have SBP > 200 and was admitted for further evaluation and treatment. CxR was remarkable for right hilar ovoid density not appreciated on previous c x r. She had a 2 D echo on 11/15 which revealed moderate Pulmonary HTN and PCCM asked to evaluate. She has not had any new respiratory issues, her major complaints are left shoulder pain and had recent cortisone injection. Her hypertension is poorly controlled as noted.   Rt hilar ovoid density Pulmonary HTN Hypoxia, o2 dependent at times Near syncope episode Malignant HTN CKD base creatine 2.0  Consider non contrasted CT for further evaluation of hilar density. Lower ext doppler study ro dvt for possible PE Auto immune panels Questionable rt heart cath in future. She was followed by Brackbill in  the past. Improve control of hypertension O2 as needed No  medication for Pul HTN should be started at this time but revaluation in future when SBP better controlled.  Brett CanalesSteve Minor ACNP [facility] PCCM Pager [phone] till 3 pm If no answer page [phone] 06/23/2014, 10:39 AM  Pulmonary HTN on echo in an 80 year old female with extensive cardiac and renal disease.  I highly doubt that she developed auto-immune disorders spontaneously at this time.  Chronic PE can be a consideration but CTA would be unadvised given renal function.  V/Q scan would be less than helpful as if the PEs are chronic (as one would suspect here given the elevated PAP which takes time to rise to this level as it is a low pressure system) would only show matched defects.  But what is more important to answer here is whether the patient is a candidate for anti-coagulation at her age which I would venture not.   Depending on how aggressive the family wishes to be may need a right heart cath with evaluation for  reactivity via adenosine or a dihydropyridine of some sort as that may address systemic and pulmonary HTN.  With regards to the hypoxemia, it is more than likely multi-factorial with pulmonary HTN, pulmonary edema and likely some emphysema but need CT to prove that.  Recommend decreasing IVF rate, patient is hypertensive with history of heart disease.  On CXR it was noted that the patient has a perihilar mass whether it is a pulmonary vessel or a true mass is impossible to determine.  Would recommend a non-contrast CT (contrast would be better but not with renal function).  PCCM will follow with you.  Patient seen and examined, agree with above note.  I dictated the care and orders written for this patient under my direction.  Alyson ReedyWesam G Dalisa Forrer, MD [phone]

## 2014-06-23 NOTE — Progress Notes (Signed)
I note that IP Rehab consult has been ordered.  Will await input from Rehab physician, then follow up as indicated.  Thank you,  Weldon PickingSusan Simone Tuckey PT Inpatient Rehab Admissions Coordinator Cell [phone] Office [phone]

## 2014-06-23 NOTE — Progress Notes (Signed)
UR Completed Tora Prunty Graves-Bigelow, RN,BSN [phone]  

## 2014-06-23 NOTE — Clinical Social Work Psychosocial (Signed)
     Clinical Social Work Department BRIEF PSYCHOSOCIAL ASSESSMENT 06/23/2014  Patient:  Nicole Harding,Nicole Harding     Account Number:  [id number]     Admit date:  06/20/2014  Clinical Social Worker:  Harless NakayamaAMBELAL,Adeyemi Hamad, LCSWA  Date/Time:  06/23/2014 02:17 PM  Referred by:  Physician  Date Referred:  06/23/2014 Referred for  SNF Placement   Other Referral:   Interview type:  Patient Other interview type:    PSYCHOSOCIAL DATA Living Status:  FACILITY Admitted from facility:  [facility] Level of care:  Independent Living Primary support name:  Amada JupiterDale & Priscella MannJulia Pilson Primary support relationship to patient:  CHILD, ADULT Degree of support available:   Pt has good family support    CURRENT CONCERNS Current Concerns  Post-Acute Placement   Other Concerns:    SOCIAL WORK ASSESSMENT / PLAN CSW notified [facility] but will need SNF at dc. CSW visited pt room and spoke with pt about dc plan. Pt informed CSW she thought she would be going upstairs for rehab. CSW explained that insurance would likely not approve this so new recommendation is for SNF. Pt understanding of this and informed CSW [facility]. Pt unsure if [facility] or go to a different facility. Pt asked for CSW to speak with [name] or Priscella MannJulia Pilson about this. Pt concerned about the long term plan. CSW explained that curren plan is for ST rehab and then pt can hopefully return back to [facility]. Pt expressed further concern about payment for SNF. CSW explained that facility would get insurance authorization once pt is admitted and would also notify her of any copayments required. Pt thankful for this information. CSW attempted to call Priscella MannJulia Pilson but unable to reach. CSW left voicemail.   Assessment/plan status:  Psychosocial Support/Ongoing Assessment of Needs Other assessment/ plan:   Information/referral to community resources:   SNF list to be  provided with bed offers    PATIENTS/FAMILYS RESPONSE TO PLAN OF CARE: Pt seated in reclinder during assessment. Pt pleasant and coopeartive. Pt is agreeable to ST rehab at SNF.      Peng Thorstenson, LCSWA  [phone]

## 2014-06-24 LAB — BASIC METABOLIC PANEL
Anion gap: 11 (ref 5–15)
BUN: 29 mg/dL — ABNORMAL HIGH (ref 6–23)
CALCIUM: 8.6 mg/dL (ref 8.4–10.5)
CO2: 22 mEq/L (ref 19–32)
Chloride: 107 mEq/L (ref 96–112)
Creatinine, Ser: 1.89 mg/dL — ABNORMAL HIGH (ref 0.50–1.10)
GFR calc non Af Amer: 23 mL/min — ABNORMAL LOW (ref >90)
GFR, EST AFRICAN AMERICAN: 26 mL/min — AB (ref >90)
Glucose, Bld: 92 mg/dL (ref 70–99)
Potassium: 4.7 mEq/L (ref 3.7–5.3)
SODIUM: 140 meq/L (ref 137–147)

## 2014-06-24 LAB — CBC
HCT: 36 % (ref 36.0–46.0)
Hemoglobin: 11.6 g/dL — ABNORMAL LOW (ref 12.0–15.0)
MCH: 28.7 pg (ref 26.0–34.0)
MCHC: 32.2 g/dL (ref 30.0–36.0)
MCV: 89.1 fL (ref 78.0–100.0)
Platelets: 264 10*3/uL (ref 150–400)
RBC: 4.04 MIL/uL (ref 3.87–5.11)
RDW: 14.3 % (ref 11.5–15.5)
WBC: 6.6 10*3/uL (ref 4.0–10.5)

## 2014-06-24 LAB — IGG, IGA, IGM
IGA: 133 mg/dL (ref 69–380)
IGG (IMMUNOGLOBIN G), SERUM: 503 mg/dL — AB (ref 690–1700)
IGM, SERUM: 37 mg/dL — AB (ref 52–322)

## 2014-06-24 MED ORDER — CETYLPYRIDINIUM CHLORIDE 0.05 % MT LIQD
7.0000 mL | Freq: Two times a day (BID) | OROMUCOSAL | Status: DC
  Administered 2014-06-24 – 2014-06-25 (×2): 7 mL via OROMUCOSAL

## 2014-06-24 NOTE — Progress Notes (Addendum)
Subjective: No events overnight  Objective: Vital signs in last 24 hours: Temp:  [97.7 F (36.5 C)-98.1 F (36.7 C)] 98.1 F (36.7 C) (11/17 0537) Pulse Rate:  [62-64] 64 (11/17 0537) Resp:  [18] 18 (11/17 0537) BP: (168-187)/(72) 187/72 mmHg (11/17 0537) SpO2:  [97 %] 97 % (11/17 0537) Weight:  [147 lb 8 oz (66.906 kg)] 147 lb 8 oz (66.906 kg) (11/17 0537) Weight change: 2 lb 7 oz (1.106 kg) Last BM Date: 06/23/14  Orthostatics: 180/74 62 lying-->183/92 70 sitting-->165/77 81 standing  CBG (last 3)  No results for input(s): GLUCAP in the last 72 hours.  Intake/Output from previous day: 11/16 0701 - 11/17 0700 In: 120 [P.O.:120] Out: 1300 [Urine:1300] Intake/Output this shift: Total I/O In: 120 [P.O.:120] Out: 350 [Urine:350]  General appearance: frail, chronically ill Eyes: no scleral icterus Throat: oropharynx moist without erythema Resp: clear to auscultation bilaterally Cardio: regular rate and rhythm GI: soft, non-tender; bowel sounds normal; no masses,  no organomegaly Extremities: no clubbing, cyanosis or edema   Lab Results:  Recent Labs  06/23/14 0418 06/24/14 0426  NA 139 140  K 4.4 4.7  CL 106 107  CO2 19 22  GLUCOSE 95 92  BUN 33* 29*  CREATININE 1.80* 1.89*  CALCIUM 8.4 8.6   No results for input(s): AST, ALT, ALKPHOS, BILITOT, PROT, ALBUMIN in the last 72 hours.  Recent Labs  06/23/14 0418 06/24/14 0426  WBC 6.6 6.6  HGB 11.6* 11.6*  HCT 36.4 36.0  MCV 91.0 89.1  PLT 263 264   Lab Results  Component Value Date   INR 1.24 06/20/2012   INR 1.21 06/19/2012   INR 1.48 06/19/2012    Recent Labs  06/21/14 0855  TROPONINI <0.30   No results for input(s): TSH, T4TOTAL, T3FREE, THYROIDAB in the last 72 hours.  Invalid input(s): FREET3 No results for input(s): VITAMINB12, FOLATE, FERRITIN, TIBC, IRON, RETICCTPCT in the last 72 hours.  Studies/Results: Ct Chest Wo Contrast  06/23/2014   CLINICAL DATA:  Hypoxia. Patient  extremely short of breath lying down particularly. Patient on oxygen but still with shortness of breath. Weakness. Recent chest radiograph demonstrate a possible right hilar mass.  EXAM: CT CHEST WITHOUT CONTRAST  TECHNIQUE: Multidetector CT imaging of the chest was performed following the standard protocol without IV contrast.  COMPARISON:  Chest radiograph, 06/20/2014.  FINDINGS: The hilar prominence noted on the current chest radiograph is due to a prominent right pulmonary artery. There is no hilar mass or adenopathy. Right pulmonary artery measures 3.1 cm in greatest diameter. The left pulmonary artery measures 3 cm. Pulmonary arteries beyond the central pulmonary vessels are more truncated.  Heart is normal in size and configuration. There are dense coronary artery calcifications. Thoracic aorta is normal in caliber. There is calcified atherosclerotic plaque along the thoracic aorta.  No neck base or axillary masses or adenopathy. No mediastinal or hilar masses or adenopathy.  There is mild bronchial wall thickening mostly in the lower lobes and also evident in the right middle lobe and left upper lobe lingula. There are linear areas of lung opacity in the lower lobes and right middle lobe and lingula of the left upper lobe that are most consistent with atelectasis. Mild bronchial wall thickening is also evident in the upper lobes. There are multiple small ill-defined pulmonary nodules, largest in the lateral right lower lobe measuring 3.5 mm. There are additional small reticular opacities that are likely due to scarring.  There is no lung consolidation. No  pleural effusion or pneumothorax. Major airways are widely patent.  Limited evaluation of the upper abdomen is unremarkable.  Bony thorax is demineralized. There are mild degenerative changes along the thoracic spine.  IMPRESSION: 1. No hilar mass. Prominence noted on the current chest radiograph is due to a prominent right pulmonary artery. 2. Both  pulmonary arteries are enlarged. Findings support pulmonary hypertension in the proper clinical setting. 3. There is also bronchial wall thickening. Although this is seen diffusely, is most prominent in the lower lobes and right middle lobe and lingula of the left upper lobe. This may all be chronic. Acute bronchial infection/inflammation should be considered likely in the proper clinical setting. There is mild associated linear lung base atelectasis. 4. No lung consolidation is seen to suggest pneumonia. Lungs are hyperexpanded consistent with COPD. 5. Multiple small lung nodules, largest measuring 3.5 mm. If the patient is at high risk for bronchogenic carcinoma, follow-up chest CT at 1 year is recommended. If the patient is at low risk, no follow-up is needed. This recommendation follows the consensus statement: Guidelines for Management of Small Pulmonary Nodules Detected on CT Scans: A Statement from the Fleischner Society as published in Radiology 2005; 237:395-400.   Electronically Signed   By: Amie Portlandavid  Ormond M.D.   On: 06/23/2014 20:28   Carotid Dopplers: Preliminary report: 1-39% ICA stenosis. Vertebral artery flow is antegrade.   Echo- not done yet  Medications: Scheduled: . antiseptic oral rinse  7 mL Mouth Rinse BID  . citric acid-sodium citrate  15 mL Oral Daily  . enoxaparin (LOVENOX) injection  30 mg Subcutaneous Q24H  . feeding supplement (ENSURE COMPLETE)  237 mL Oral BID BM  . hydrALAZINE  25 mg Oral 4 times per day  . isosorbide mononitrate  60 mg Oral Daily  . metoprolol succinate  25 mg Oral Daily  . multivitamin with minerals  1 tablet Oral Daily  . pantoprazole  40 mg Oral Daily  . pneumococcal 23 valent vaccine  0.5 mL Intramuscular Tomorrow-1000  . sodium chloride  3 mL Intravenous Q12H   Continuous: . sodium chloride 50 mL/hr at 06/23/14 1256    Assessment/Plan: Principal Problem: 1. Near syncope- orthostasis being treated w/ gentle IVF.  Carotid dopplers  relatively normal.moderate pulmonary HTN shown on TTE may be contributing, pulmonary following. Asymptomatic in house. To SNF pending  Active Problems: 2. Malignant hypertension-remains uncontrolled. Increased her imdur to 60mg  qd for afterload reduction . Monitor  3. CKD (chronic kidney disease) stage 4, GFR 15-29 ml/min- stable  4. Pulmonary HTN- chronic hypoxia of unclear etiology, worked up as outpatient by Dr Sherene SiresWert. TTE and CT chest confirms newly diagnosed pulmonary HTN.  Pulmonary on board. CT chest shows findings indicative of COPD, which could explain the condition. There is possibility of chronic PEs, but she has IVC filter and hx of subarachnoid hemorrhage so anticoag contraindicated and her CKD prevents CTA to evaluate.  Calling cardiology consult to discuss consideration of RHC in work up of etiology. Treating HTN as above 5. Disposition- continue PT/OT for generalized weakness. OOB to chair.Potentially to SNF tomorrow pending input from cards and pulm   Ethics -- spoke w/ son in law, dale, who states he looked at her living will which states she does not want any form of artificial means of living and he discussed w/ patient and agrees goals should be DO NOT RESUSCITATE. Order to be placed.     LOS: 4 days   Coren Sagan 06/24/2014, 8:49 AM

## 2014-06-24 NOTE — Plan of Care (Signed)
Problem: Phase II Progression Outcomes Goal: Vital signs remain stable Outcome: Progressing Goal: IV changed to normal saline lock Outcome: Not Progressing Goal: Obtain order to discontinue catheter if appropriate Outcome: Not Applicable Date Met:  65/78/46

## 2014-06-24 NOTE — Consult Note (Signed)
Referring Physician:  Primary Physician: Primary Cardiologist: Reason for Consultation:    HPI:  80 y/o woman with multiple medical problems including severe HTN, Parkinson's disease and chronic respiratory failure. Admitted with recurrent presyncope.   Admitted in 10/13 with chest pain after a fall. VQ showed intermediate probability of PE. RV normal on echo.  Subsequently had recurrent fall with SDH in 11/13. IVC filter placed.   F/u VQ 7/14: No PE with Mottled aeration of the bilateral lungs, right greater than left, suggestive of air trapping/airways disease.  She presented to the emergency department on 06/1314 with the complaint of feeling faint. Patient states that she had been feeling poorly for the past several days. Despite this, went to the beauty parlor that morning and when she returned she felt like she might pass out. She denies any vertigo symptoms but felt lightheaded. No nausea or vomiting.She called EMS who checked her blood pressure and it was running above 200. They gave her an extra dose of her metoprolol with minimal improvement and then brought her to the emergency department where her blood pressure was 208/73.   CT chest without contrast showed COPD and dilated pulmonary arteries. Echo EF 60-65% with diastolic dysfunction and markedly elevated left-sided filling pressures. RV normal RSVP 47. Carotis u/s 1-39% bilaterally   Review of Systems:     Cardiac Review of Systems: {Y] = yes [ ]  = no  Chest Pain [    ]  Resting SOB [   ] Exertional SOB  [ y ]  Orthopnea [  ]   Pedal Edema [   ]    Palpitations [  ] Syncope  [  ]   Presyncope [  y ]  General Review of Systems: [Y] = yes [  ]=no Constitional: recent weight change [  ]; anorexia [  ]; fatigue [place]ienne  ]; nausea [  ]; night sweats [  ]; fever [  ]; or chills [  ];                                                                      Eyes : blurred vision [  ]; diplopia [   ]; vision changes [  ];  Amaurosis  fugax[  ]; Resp: cough [  ];  wheezing[  ];  hemoptysis[  ];  PND [  ];  GI:  gallstones[  ], vomiting[  ];  dysphagia[  ]; melena[  ];  hematochezia [  ]; heartburn[  ];   GU: kidney stones [  ]; hematuria[  ];   dysuria [  ];  nocturia[  ]; incontinence [  ];             Skin: rash, swelling[  ];, hair loss[  ];  peripheral edema[  ];  or itching[  ]; Musculosketetal: myalgias[  ];  joint swelling[  ];  joint erythema[  ];  joint pain[ y ];  back pain[  ];  Heme/Lymph: bruising[  ];  bleeding[  ];  anemia[  ];  Neuro: TIA[  ];  headaches[  ];  stroke[  ];  vertigo[  ];  seizures[  ];   paresthesias[  ];  difficulty walking[  ];  Psych:depression[  ]; anxiety[  ];  Endocrine: diabetes[  ];  thyroid dysfunction[  ];  Other:  Past Medical History  Diagnosis Date  . Hypertension   . History of orthostatic hypotension   . IBS (irritable bowel syndrome)   . Parkinson's disease     ATYPICAL  . Dizziness   . Vertigo   . Fatigue   . Advanced age   . HLD (hyperlipidemia)     intolerant to lipid lowering drugs  . Normal nuclear stress test 2003  . Fall at home 05/2012    mechanical fall  (06/01/2012)  . Pulmonary embolism 05/31/2012    off coumadin due to fall/SDH; has filter in place  . Chronic kidney disease, stage 4 (severe)   . Exertional dyspnea   . Arthritis     "legs, fingers" (06/01/2012)  . Subdural hematoma     Medications Prior to Admission  Medication Sig Dispense Refill  . citric acid-sodium citrate (ORACIT) 334-500 MG/5ML solution Take 15 mLs by mouth daily.    . famotidine (PEPCID) 20 MG tablet Take 20 mg by mouth at bedtime.     . hydrALAZINE (APRESOLINE) 25 MG tablet Take 25 mg by mouth 4 (four) times daily.    . isosorbide mononitrate (IMDUR) 30 MG 24 hr tablet TAKE ONE TABLET EACH DAY 30 tablet 6  . metoprolol succinate (TOPROL-XL) 25 MG 24 hr tablet Take 25 mg by mouth See admin instructions. Take 1 tablet by mouth twice daily, as scheduled. If blood pressure  exceeds 150, take additional tablet    . Multiple Vitamins-Minerals (CENTRUM SILVER PO) Take 1 tablet by mouth daily.    . Multiple Vitamins-Minerals (HAIR/SKIN/NAILS PO) Take 1 tablet by mouth daily.    . pantoprazole (PROTONIX) 40 MG tablet Take 1 tablet (40 mg total) by mouth daily. Take 30-60 min before first meal of the day 30 tablet 2  . polyethylene glycol powder (MIRALAX) powder Take 17 g by mouth daily as needed for mild constipation.     Marland Kitchen. azithromycin (ZITHROMAX) 250 MG tablet Take 2 tabs PO x 1 dose, then 1 tab PO QD x 4 days 6 tablet 0  . cephALEXin (KEFLEX) 500 MG capsule Take 1 capsule (500 mg total) by mouth 2 (two) times daily. 20 capsule 0  . famotidine (PEPCID) 20 MG tablet TAKE ONE TABLET AT BEDTIME (Patient not taking: Reported on 06/20/2014) 30 tablet 5  . hydrALAZINE (APRESOLINE) 25 MG tablet TAKE ONE TABLET FOUR TIMES DAILY (Patient not taking: Reported on 06/20/2014) 360 tablet 0  . metoprolol succinate (TOPROL-XL) 25 MG 24 hr tablet TAKE ONE TABLET TWICE DAILY -IF BLOOD PRESSURE GOES ABOVE 150-TAKE ANEXTRA PILL (Patient not taking: Reported on 06/20/2014) 60 tablet 3     . antiseptic oral rinse  7 mL Mouth Rinse BID  . citric acid-sodium citrate  15 mL Oral Daily  . enoxaparin (LOVENOX) injection  30 mg Subcutaneous Q24H  . feeding supplement (ENSURE COMPLETE)  237 mL Oral BID BM  . hydrALAZINE  25 mg Oral 4 times per day  . isosorbide mononitrate  60 mg Oral Daily  . metoprolol succinate  25 mg Oral Daily  . multivitamin with minerals  1 tablet Oral Daily  . pantoprazole  40 mg Oral Daily  . pneumococcal 23 valent vaccine  0.5 mL Intramuscular Tomorrow-1000  . sodium chloride  3 mL Intravenous Q12H    Infusions: . sodium chloride 50 mL/hr at 06/23/14 1256    Allergies  Allergen Reactions  .  Amlodipine Nausea And Vomiting and Swelling    Swelling --sick and ended up in hospital N&V  . Codeine Nausea And Vomiting  . Lipitor [Atorvastatin Calcium] Other  (See Comments)    "cramps my legs"  . Lisinopril Other (See Comments)    unknown  . Prozac [Fluoxetine Hcl] Nausea And Vomiting    syncope  . Red Yeast Rice Other (See Comments)    unknown  . Zetia [Ezetimibe] Other (See Comments)    unknown  . Zoloft [Sertraline Hcl]     Extreme weakness    History   Social History  . Marital Status: Widowed    Spouse Name: N/A    Number of Children: N/A  . Years of Education: N/A   Occupational History  . Retired    Social History Main Topics  . Smoking status: Former Smoker -- 1.00 packs/day for 20 years    Types: Cigarettes    Quit date: 02/14/1971  . Smokeless tobacco: Never Used  . Alcohol Use: No  . Drug Use: No  . Sexual Activity: No   Other Topics Concern  . Not on file   Social History Narrative    Family History  Problem Relation Age of Onset  . Stroke Father   . Emphysema Brother     smoked  . Heart disease      siblings    PHYSICAL EXAM: Filed Vitals:   06/24/14 1325  BP: 150/63  Pulse: 65  Temp: 98.6 F (37 C)  Resp: 17     Intake/Output Summary (Last 24 hours) at 06/24/14 1348 Last data filed at 06/24/14 1329  Gross per 24 hour  Intake    360 ml  Output   1650 ml  Net  -1290 ml    General:  Elderly. Well appearing. Sitting in chair No respiratory difficulty HEENT: normal Neck: supple. JVP 6. Carotids 2+ bilat; no bruits. No lymphadenopathy or thryomegaly appreciated. Cor: PMI nondisplaced. Regular rate & rhythm. No rubs, gallops or murmurs. Lungs: clear Abdomen: soft, nontender, nondistended. No hepatosplenomegaly. No bruits or masses. Good bowel sounds. Extremities: no cyanosis, clubbing, rash, edema Neuro: alert & oriented x 3, cranial nerves grossly intact. moves all 4 extremities w/o difficulty. Affect pleasant.  ECG: Sinus brady 54. 1st degree AV block No ST-T wave abnormalities.    Results for orders placed or performed during the hospital encounter of 06/20/14 (from the past 24  hour(s))  Basic metabolic panel     Status: Abnormal   Collection Time: 06/24/14  4:26 AM  Result Value Ref Range   Sodium 140 137 - 147 mEq/L   Potassium 4.7 3.7 - 5.3 mEq/L   Chloride 107 96 - 112 mEq/L   CO2 22 19 - 32 mEq/L   Glucose, Bld 92 70 - 99 mg/dL   BUN 29 (H) 6 - 23 mg/dL   Creatinine, Ser 9.60 (H) 0.50 - 1.10 mg/dL   Calcium 8.6 8.4 - 45.4 mg/dL   GFR calc non Af Amer 23 (L) >90 mL/min   GFR calc Af Amer 26 (L) >90 mL/min   Anion gap 11 5 - 15  CBC     Status: Abnormal   Collection Time: 06/24/14  4:26 AM  Result Value Ref Range   WBC 6.6 4.0 - 10.5 K/uL   RBC 4.04 3.87 - 5.11 MIL/uL   Hemoglobin 11.6 (L) 12.0 - 15.0 g/dL   HCT 09.8 11.9 - 14.7 %   MCV 89.1 78.0 - 100.0 fL  MCH 28.7 26.0 - 34.0 pg   MCHC 32.2 30.0 - 36.0 g/dL   RDW 04.5 40.9 - 81.1 %   Platelets 264 150 - 400 K/uL   Ct Chest Wo Contrast  06/23/2014   CLINICAL DATA:  Hypoxia. Patient extremely short of breath lying down particularly. Patient on oxygen but still with shortness of breath. Weakness. Recent chest radiograph demonstrate a possible right hilar mass.  EXAM: CT CHEST WITHOUT CONTRAST  TECHNIQUE: Multidetector CT imaging of the chest was performed following the standard protocol without IV contrast.  COMPARISON:  Chest radiograph, 06/20/2014.  FINDINGS: The hilar prominence noted on the current chest radiograph is due to a prominent right pulmonary artery. There is no hilar mass or adenopathy. Right pulmonary artery measures 3.1 cm in greatest diameter. The left pulmonary artery measures 3 cm. Pulmonary arteries beyond the central pulmonary vessels are more truncated.  Heart is normal in size and configuration. There are dense coronary artery calcifications. Thoracic aorta is normal in caliber. There is calcified atherosclerotic plaque along the thoracic aorta.  No neck base or axillary masses or adenopathy. No mediastinal or hilar masses or adenopathy.  There is mild bronchial wall thickening  mostly in the lower lobes and also evident in the right middle lobe and left upper lobe lingula. There are linear areas of lung opacity in the lower lobes and right middle lobe and lingula of the left upper lobe that are most consistent with atelectasis. Mild bronchial wall thickening is also evident in the upper lobes. There are multiple small ill-defined pulmonary nodules, largest in the lateral right lower lobe measuring 3.5 mm. There are additional small reticular opacities that are likely due to scarring.  There is no lung consolidation. No pleural effusion or pneumothorax. Major airways are widely patent.  Limited evaluation of the upper abdomen is unremarkable.  Bony thorax is demineralized. There are mild degenerative changes along the thoracic spine.  IMPRESSION: 1. No hilar mass. Prominence noted on the current chest radiograph is due to a prominent right pulmonary artery. 2. Both pulmonary arteries are enlarged. Findings support pulmonary hypertension in the proper clinical setting. 3. There is also bronchial wall thickening. Although this is seen diffusely, is most prominent in the lower lobes and right middle lobe and lingula of the left upper lobe. This may all be chronic. Acute bronchial infection/inflammation should be considered likely in the proper clinical setting. There is mild associated linear lung base atelectasis. 4. No lung consolidation is seen to suggest pneumonia. Lungs are hyperexpanded consistent with COPD. 5. Multiple small lung nodules, largest measuring 3.5 mm. If the patient is at high risk for bronchogenic carcinoma, follow-up chest CT at 1 year is recommended. If the patient is at low risk, no follow-up is needed. This recommendation follows the consensus statement: Guidelines for Management of Small Pulmonary Nodules Detected on CT Scans: A Statement from the Fleischner Society as published in Radiology 2005; 237:395-400.   Electronically Signed   By: Amie Portland M.D.   On:  06/23/2014 20:28     ASSESSMENT: 1. Near syncope 2. Pulmonary HTN on echo 3. COPD with chronic respiratory failure 4. Malignant HTN 5. Parkinson's disease 6. ? Of remote PE 2013 with IVC filter  7. Recurrent falls with remote SDH  PLAN/DISCUSSION:  I have reviewed her echo and other studies personally. She has mild to moderate pulmonary HTN by echo which is likely WHO Group II and III (diastolic heart failure and chronic lung disease) in nature. There  is no evidence of RV failure and on exam her SBP is markedly elevated which argues against PH being the cause of her dizziness/presyncope (syncope due to RHF is due low left-sided preload resulting in diminished cerebral perfusion). Additionally, I doubt chronic PE or other forms of PH are playing a role here.   At this point would not work-up her PH any further. For treatment would focus on control of her BP and diurese as needed. Would also ensure adequate oxygenation with supplemental O2 to keep sats >= 90% at all time. If continues to have episodic dizziness can place event monitor to look for arrhythmias.   We can see her back as needed.  Arhum Peeples,MD 2:04 PM

## 2014-06-24 NOTE — Progress Notes (Signed)
PCCM Interval Note  Reviewed CT chest and TTE. No evidence of a hilar mass but she does appear to have multifactorial secondary PAH. I would keep her oxygenated, treat underlying causes as able. She is a poor candidate for PAH-targeted medical therapy due to her age and contributing causes. This would likely affect the risk / benefits of a R heart cath or other invasive procedures. Will follow her tomorrow.   Levy Pupaobert Arneshia Ade, MD, PhD 06/24/2014, 2:34 PM [facility] [phone] or if no answer [phone]

## 2014-06-25 LAB — BASIC METABOLIC PANEL
Anion gap: 12 (ref 5–15)
BUN: 28 mg/dL — AB (ref 6–23)
CO2: 20 meq/L (ref 19–32)
CREATININE: 1.87 mg/dL — AB (ref 0.50–1.10)
Calcium: 8.5 mg/dL (ref 8.4–10.5)
Chloride: 105 mEq/L (ref 96–112)
GFR calc Af Amer: 27 mL/min — ABNORMAL LOW (ref >90)
GFR calc non Af Amer: 23 mL/min — ABNORMAL LOW (ref >90)
Glucose, Bld: 89 mg/dL (ref 70–99)
Potassium: 4.4 mEq/L (ref 3.7–5.3)
Sodium: 137 mEq/L (ref 137–147)

## 2014-06-25 LAB — CBC
HCT: 35.5 % — ABNORMAL LOW (ref 36.0–46.0)
HEMOGLOBIN: 11.3 g/dL — AB (ref 12.0–15.0)
MCH: 28.4 pg (ref 26.0–34.0)
MCHC: 31.8 g/dL (ref 30.0–36.0)
MCV: 89.2 fL (ref 78.0–100.0)
Platelets: 257 10*3/uL (ref 150–400)
RBC: 3.98 MIL/uL (ref 3.87–5.11)
RDW: 14.2 % (ref 11.5–15.5)
WBC: 6.8 10*3/uL (ref 4.0–10.5)

## 2014-06-25 LAB — ANTI-SMOOTH MUSCLE ANTIBODY, IGG: F-Actin IgG: 34 U — ABNORMAL HIGH (ref <20)

## 2014-06-25 MED ORDER — ENSURE COMPLETE PO LIQD: 237.0000 mL | Freq: Two times a day (BID) | ORAL | Status: AC

## 2014-06-25 MED ORDER — ACETAMINOPHEN 325 MG PO TABS: 650.0000 mg | ORAL_TABLET | Freq: Four times a day (QID) | ORAL | Status: DC | PRN

## 2014-06-25 MED ORDER — ISOSORBIDE MONONITRATE ER 60 MG PO TB24: 60.0000 mg | ORAL_TABLET | Freq: Every day | ORAL | Status: AC

## 2014-06-25 NOTE — Discharge Summary (Addendum)
Physician Discharge Summary    Nicole Harding  MR#: [id number]  DOB:1924/04/27  Date of Admission: 06/20/2014 Date of Discharge: 06/25/2014  Attending Physician:Dilcia Rybarczyk  Patient's WUJ:Nicole Patty Sermons, MD  Consults:  pulm and cards   Discharge Diagnoses: Principal Problem:   Near syncope Active Problems:   CKD (chronic kidney disease) stage 4, GFR 15-29 ml/min   Chronic respiratory failure   Malignant hypertension   Oxygen dependent   Lung mass   Pulmonary hypertensive arterial disease   Discharge Medications:   Medication List    STOP taking these medications        azithromycin 250 MG tablet  Commonly known as:  ZITHROMAX     CENTRUM SILVER PO     cephALEXin 500 MG capsule  Commonly known as:  KEFLEX     HAIR/SKIN/NAILS PO      TAKE these medications        acetaminophen 325 MG tablet  Commonly known as:  TYLENOL  Take 2 tablets (650 mg total) by mouth every 6 (six) hours as needed for mild pain (or Fever >/= 101).     citric acid-sodium citrate 334-500 MG/5ML solution  Commonly known as:  ORACIT  Take 15 mLs by mouth daily.     famotidine 20 MG tablet  Commonly known as:  PEPCID  Take 20 mg by mouth at bedtime.     feeding supplement (ENSURE COMPLETE) Liqd  Take 237 mLs by mouth 2 (two) times daily between meals.     hydrALAZINE 25 MG tablet  Commonly known as:  APRESOLINE  Take 25 mg by mouth 4 (four) times daily.     isosorbide mononitrate 60 MG 24 hr tablet  Commonly known as:  IMDUR  Take 1 tablet (60 mg total) by mouth daily.     metoprolol succinate 25 MG 24 hr tablet  Commonly known as:  TOPROL-XL  Take 25 mg by mouth See admin instructions. Take 1 tablet by mouth twice daily, as scheduled. If blood pressure exceeds 150, take additional tablet     MIRALAX powder  Generic drug:  polyethylene glycol powder  Take 17 g by mouth daily as needed for mild constipation.     pantoprazole 40 MG tablet  Commonly known as:   PROTONIX  Take 1 tablet (40 mg total) by mouth daily. Take 30-60 min before first meal of the day        Hospital Procedures: Dg Chest 2 View  06/20/2014   CLINICAL DATA:  Chest pain  EXAM: CHEST  2 VIEW  COMPARISON:  Chest radiograph 12/02/2013  FINDINGS: 3.4 x 3.0 cm ovoid opacity projects over the right hilum, likely hilar vasculature given stability since a prior study dating back to at least 05/31/2012. However, compared to less recent prior exams may indicate enlargement and the question of a pulmonary arterial aneurysm is raised. Heart size is normal. Bilateral upper lobe granuloma are stable. No pleural effusion. No acute osseous finding.  IMPRESSION: Ovoid right hilar masslike density which could represent hilar vasculature but a pulmonary arterial aneurysm could appear similar. Chest CT with IV contrast is recommended for further evaluation.   Electronically Signed   By: Christiana Pellant M.D.   On: 06/20/2014 17:44   Ct Chest Wo Contrast  06/23/2014   CLINICAL DATA:  Hypoxia. Patient extremely short of breath lying down particularly. Patient on oxygen but still with shortness of breath. Weakness. Recent chest radiograph demonstrate a possible right hilar mass.  EXAM: CT CHEST WITHOUT  CONTRAST  TECHNIQUE: Multidetector CT imaging of the chest was performed following the standard protocol without IV contrast.  COMPARISON:  Chest radiograph, 06/20/2014.  FINDINGS: The hilar prominence noted on the current chest radiograph is due to a prominent right pulmonary artery. There is no hilar mass or adenopathy. Right pulmonary artery measures 3.1 cm in greatest diameter. The left pulmonary artery measures 3 cm. Pulmonary arteries beyond the central pulmonary vessels are more truncated.  Heart is normal in size and configuration. There are dense coronary artery calcifications. Thoracic aorta is normal in caliber. There is calcified atherosclerotic plaque along the thoracic aorta.  No neck base or axillary  masses or adenopathy. No mediastinal or hilar masses or adenopathy.  There is mild bronchial wall thickening mostly in the lower lobes and also evident in the right middle lobe and left upper lobe lingula. There are linear areas of lung opacity in the lower lobes and right middle lobe and lingula of the left upper lobe that are most consistent with atelectasis. Mild bronchial wall thickening is also evident in the upper lobes. There are multiple small ill-defined pulmonary nodules, largest in the lateral right lower lobe measuring 3.5 mm. There are additional small reticular opacities that are likely due to scarring.  There is no lung consolidation. No pleural effusion or pneumothorax. Major airways are widely patent.  Limited evaluation of the upper abdomen is unremarkable.  Bony thorax is demineralized. There are mild degenerative changes along the thoracic spine.  IMPRESSION: 1. No hilar mass. Prominence noted on the current chest radiograph is due to a prominent right pulmonary artery. 2. Both pulmonary arteries are enlarged. Findings support pulmonary hypertension in the proper clinical setting. 3. There is also bronchial wall thickening. Although this is seen diffusely, is most prominent in the lower lobes and right middle lobe and lingula of the left upper lobe. This may all be chronic. Acute bronchial infection/inflammation should be considered likely in the proper clinical setting. There is mild associated linear lung base atelectasis. 4. No lung consolidation is seen to suggest pneumonia. Lungs are hyperexpanded consistent with COPD. 5. Multiple small lung nodules, largest measuring 3.5 mm. If the patient is at high risk for bronchogenic carcinoma, follow-up chest CT at 1 year is recommended. If the patient is at low risk, no follow-up is needed. This recommendation follows the consensus statement: Guidelines for Management of Small Pulmonary Nodules Detected on CT Scans: A Statement from the Fleischner  Society as published in Radiology 2005; 237:395-400.   Electronically Signed   By: Amie Portlandavid  Ormond M.D.   On: 06/23/2014 20:28    History of Present Illness: Nicole Harding is an 80 year old white female with a history of hypertension, chronic renal insufficiency, and Parkinson's who presented to the emergency department with the complaint of feeling faint. Patient states that she's been feeling poorly for the past several days. Despite this, went to the beauty parlor this morning and when she returned she felt like she might pass out. She denies any vertigo symptoms but felt lightheaded. No nausea or vomiting.She called EMS who checked her blood pressure and it was running above 200. They gave her an extra dose of her metoprolol with minimal improvement and then brought her to the emergency department where her blood pressure was 208/73. She has a history of chronic lung disease on home oxygen at night. She denies any significant increase in shortness of breath recently but does describe chronic dyspnea on exertion. She has had significant left  shoulder and chest wall pain recently. The pain is worse with a deep breath. She had an cortisone injection into her left shoulder 2 days ago with minimal improvement.   Hospital Course: Dizziness - likely 2/2 uncontrolled HTN . See below for plan. While she was dx w/ pulm HTN during her hospital stay, it is felt this is not the cause of symptoms.TTE showed no shunt or valvular anomaly, only pulm HTN and grade 1 diastolic dysfxn. CUS was unremarkable. If continues to have dizziness, event monitor for arhythmia may be warranted   HTN - very VOLATILE BP. Increased imdur from 30 to 60. SBP ranging from 130-190s. While HTN she remains orthostatic , so we must use caution. Further increases in meds can occur as outpatient, but we must use caution.   Pulm HTN - Pulmonology and cardiology weighed in on this condition. See their consultation notes for details. It is  felt her condition is likely 2/2 chronic pulm condition of COPD plus diastolic CHF. We will treat w/ O2 on discharge, no need for inhalers at this time. Lasix is not on her medication list and was not needed during her hospital stay b/c no dyspnea developed, however may be needed in future. CT chest confirmed both the pulm HTN and the COPD diagnosis. Pt is poor candidate for PH treatment and no further w/u necessary   Malnutrition  - adding ensure supplements   Deconditioning - to SNF       Day of Discharge Exam BP 194/72 mmHg  Pulse 64  Temp(Src) 98 F (36.7 C) (Oral)  Resp 18  Ht 5\' 6"  (1.676 m)  Wt 147 lb 11.3 oz (67 kg)  BMI 23.85 kg/m2  SpO2 96%  Physical Exam: General appearance: Nicole Harding in NAD  Eyes: no scleral icterus Throat: oropharynx moist without erythema Resp: CTAB, no wr  Cardio: RRR no mrg  GI: soft, non-tender; bowel sounds normal; no masses,  no organomegaly Extremities: no clubbing, cyanosis or edema  Discharge Labs:  Recent Labs  06/24/14 0426 06/25/14 0437  NA 140 137  K 4.7 4.4  CL 107 105  CO2 22 20  GLUCOSE 92 89  BUN 29* 28*  CREATININE 1.89* 1.87*  CALCIUM 8.6 8.5   No results for input(s): AST, ALT, ALKPHOS, BILITOT, PROT, ALBUMIN in the last 72 hours.  Recent Labs  06/24/14 0426 06/25/14 0437  WBC 6.6 6.8  HGB 11.6* 11.3*  HCT 36.0 35.5*  MCV 89.1 89.2  PLT 264 257   Lab Results  Component Value Date   INR 1.24 06/20/2012   INR 1.21 06/19/2012   INR 1.48 06/19/2012   EXAM: CT CHEST WITHOUT CONTRAST  TECHNIQUE: Multidetector CT imaging of the chest was performed following the standard protocol without IV contrast.  COMPARISON: Chest radiograph, 06/20/2014.  FINDINGS: The hilar prominence noted on the current chest radiograph is due to a prominent right pulmonary artery. There is no hilar mass or adenopathy. Right pulmonary artery measures 3.1 cm in greatest diameter. The left pulmonary artery measures 3 cm.  Pulmonary arteries beyond the central pulmonary vessels are more truncated.  Heart is normal in size and configuration. There are dense coronary artery calcifications. Thoracic aorta is normal in caliber. There is calcified atherosclerotic plaque along the thoracic aorta.  No neck base or axillary masses or adenopathy. No mediastinal or hilar masses or adenopathy.  There is mild bronchial wall thickening mostly in the lower lobes and also evident in the right middle lobe and left upper  lobe lingula. There are linear areas of lung opacity in the lower lobes and right middle lobe and lingula of the left upper lobe that are most consistent with atelectasis. Mild bronchial wall thickening is also evident in the upper lobes. There are multiple small ill-defined pulmonary nodules, largest in the lateral right lower lobe measuring 3.5 mm. There are additional small reticular opacities that are likely due to scarring.  There is no lung consolidation. No pleural effusion or pneumothorax. Major airways are widely patent.  Limited evaluation of the upper abdomen is unremarkable.  Bony thorax is demineralized. There are mild degenerative changes along the thoracic spine.  IMPRESSION: 1. No hilar mass. Prominence noted on the current chest radiograph is due to a prominent right pulmonary artery. 2. Both pulmonary arteries are enlarged. Findings support pulmonary hypertension in the proper clinical setting. 3. There is also bronchial wall thickening. Although this is seen diffusely, is most prominent in the lower lobes and right middle lobe and lingula of the left upper lobe. This may all be chronic. Acute bronchial infection/inflammation should be considered likely in the proper clinical setting. There is mild associated linear lung base atelectasis. 4. No lung consolidation is seen to suggest pneumonia. Lungs are hyperexpanded consistent with COPD. 5. Multiple small lung nodules,  largest measuring 3.5 mm. If the patient is at high risk for bronchogenic carcinoma, follow-up chest CT at 1 year is recommended. If the patient is at low risk, no follow-up is needed. This recommendation follows the consensus statement: Guidelines for Management of Small Pulmonary Nodules Detected on CT Scans: A Statement from the Fleischner Society as published in Radiology 2005; 237:395-400.   Electronically Signed  By: Amie Portland M.D.  On: 06/23/2014 20:28    Transthoracic Echocardiography  Patient:  Beyounce, Dickens MR #:    [id number] Study Date: 06/22/2014 Gender:   F Age:    33 Height:   167.6 cm Weight:   65.8 kg BSA:    1.76 m^2 Pt. Status: Room:    3W20C  SONOGRAPHER Sherren Kerns, RVS Jane Canary, Lacretia Nicks [id number] Rockne Coons, Florestine Avers ATTENDING  Robinson, Lorin Picket [id number] PERFORMING  [facility], Inpatient SONOGRAPHER Virgina Evener  cc:  ------------------------------------------------------------------- LV EF: 60% -  65%  ------------------------------------------------------------------- Indications:   Syncope 780.2.  ------------------------------------------------------------------- History:  PMH: chronic respiratory failure, orthostatic hypotension, hypercholesterolemia, benign hypertensive heart disease with heart failure, chronic renal insufficiency, pulmonary embolism, and hypertension.  ------------------------------------------------------------------- Study Conclusions  - Left ventricle: E/e&'>15.8 suggestive of elevated LV filling pressures. The cavity size was normal. Systolic function was normal. The estimated ejection fraction was in the range of 60% to 65%. Wall motion was normal; there were no regional wall motion abnormalities. There was an increased relative contribution of atrial contraction to ventricular filling. Doppler parameters are  consistent with abnormal left ventricular relaxation (grade 1 diastolic dysfunction). - Aortic valve: Trileaflet; normal thickness, mildly calcified leaflets. There was trivial regurgitation. - Mitral valve: Calcified annulus. Mild diffuse thickening and calcification. There was mild regurgitation. - Pulmonary arteries: PA peak pressure: 47 mm Hg (S).  Impressions:  - The right ventricular systolic pressure was increased consistent with moderate pulmonary hypertension.  Transthoracic echocardiography. M-mode, complete 2D, spectral Doppler, and color Doppler. Birthdate: Patient birthdate: [birth year]. Age: Patient is 80 yr old. Sex: Gender: female. BMI: 23.4 kg/m^2. Blood pressure:   155/64 Patient status: Inpatient. Study date: Study date: 06/22/2014. Study time: 03:05 PM. Location: Bedside.  -------------------------------------------------------------------  ------------------------------------------------------------------- Left ventricle: E/e&'>15.8  suggestive of elevated LV filling pressures. The cavity size was normal. Systolic function was normal. The estimated ejection fraction was in the range of 60% to 65%. Wall motion was normal; there were no regional wall motion abnormalities. There was an increased relative contribution of atrial contraction to ventricular filling. Doppler parameters are consistent with abnormal left ventricular relaxation (grade 1 diastolic dysfunction).  ------------------------------------------------------------------- Aortic valve:  Trileaflet; normal thickness, mildly calcified leaflets. Mobility was not restricted. Doppler: Transvalvular velocity was within the normal range. There was no stenosis. There was trivial regurgitation.  ------------------------------------------------------------------- Aorta: Aortic root: The aortic root was normal in  size.  ------------------------------------------------------------------- Mitral valve:  Calcified annulus. Mild diffuse thickening and calcification. Mobility was not restricted. Doppler: Transvalvular velocity was within the normal range. There was no evidence for stenosis. There was mild regurgitation.  Peak gradient (D): 3 mm Hg.  ------------------------------------------------------------------- Left atrium: The atrium was normal in size.  ------------------------------------------------------------------- Right ventricle: The cavity size was normal. Wall thickness was normal. Systolic function was normal.  ------------------------------------------------------------------- Pulmonic valve:  Doppler: Transvalvular velocity was within the normal range. There was no evidence for stenosis.  ------------------------------------------------------------------- Tricuspid valve:  Structurally normal valve.  Doppler: Transvalvular velocity was within the normal range. There was mild regurgitation.  ------------------------------------------------------------------- Pulmonary artery:  The main pulmonary artery was normal-sized. Systolic pressure was within the normal range.  ------------------------------------------------------------------- Right atrium: The atrium was normal in size.  ------------------------------------------------------------------- Pericardium: There was no pericardial effusion.  ------------------------------------------------------------------- Systemic veins: Inferior vena cava: The vessel was normal in size.    No results for input(s): CKTOTAL, CKMB, CKMBINDEX, TROPONINI in the last 72 hours. No results for input(s): TSH, T4TOTAL, T3FREE, THYROIDAB in the last 72 hours.  Invalid input(s): FREET3 No results for input(s): VITAMINB12, FOLATE, FERRITIN, TIBC, IRON, RETICCTPCT in the last 72 hours.  Discharge instructions:      Discharge Instructions    Diet - low sodium heart healthy    Complete by:  As directed      Increase activity slowly    Complete by:  As directed           01-Home or Self Care   Disposition: to SNF   Follow-up Appts: Follow-up with Dr. Link Snuffer at [facility] after discharge from [facility].  Call for appointment.  Condition on Discharge: stable  Tests Needing Follow-up:none  Time spent in discharge (includes decision making & examination of pt): 45 minutes    Signed: Jermale Crass 06/25/2014, 8:12 AM

## 2014-06-25 NOTE — Progress Notes (Signed)
Physical Therapy Treatment Patient Details Name: Nicole Harding MRN: [id number] DOB: [birth year] Today's Date: 06/25/2014    History of Present Illness Nicole Harding is an 80 year old white female with a history of hypertension, chronic renal insufficiency, and Parkinson's who presented to the emergency department with the complaint of feeling faint. Patient states that she's been feeling poorly for the past several days.  Despite this, went to the beauty parlor this morning and when she returned she felt like she might pass out. She denies any vertigo symptoms but felt lightheaded. No nausea or vomiting.She called EMS who checked her blood pressure and it was running above 200.  They gave her an extra dose of her metoprolol with minimal improvement and then brought her to the emergency department where her blood pressure was 208/73.  She has a history of chronic lung disease on home oxygen at night.  She denies any significant increase in shortness of breath recently but does describe chronic dyspnea on exertion. She has had significant left shoulder and chest wall pain recently. The pain is worse with a deep breath. She had an cortisone injection into her left shoulder 2 days ago with minimal improvement    PT Comments    Pt ambulated 450' with RW and min A, desat to 85% on RA, O2 reapplied after. Pt still needing assistance with transfers and needing RW for balance. Pt very emotional throughout session, missing her late husband. To d/c to SNF today, has met pertinent inpt PT goals. D/c from PT.  Follow Up Recommendations  SNF;Supervision/Assistance - 24 hour     Equipment Recommendations  Rolling walker with 5" wheels    Recommendations for Other Services       Precautions / Restrictions Precautions Precautions: Fall Precaution Comments: spot check O2 Restrictions Weight Bearing Restrictions: No    Mobility  Bed Mobility Overal bed mobility: Modified Independent              General bed mobility comments: performed bed mobility independently  Transfers Overall transfer level: Needs assistance Equipment used: Rolling walker (2 wheeled) Transfers: Sit to/from Stand Sit to Stand: Min assist         General transfer comment: min A for power up from bed and chair  Ambulation/Gait Ambulation/Gait assistance: Supervision Ambulation Distance (Feet): 450 Feet Assistive device: Rolling walker (2 wheeled) Gait Pattern/deviations: Step-through pattern;Decreased stride length;Trunk flexed Gait velocity: decreased   General Gait Details: vc's for erect posture which pt was able to correct partially, pt seemed less emotional when walking so let her ambulate as far as she wished   Stairs            Wheelchair Mobility    Modified Rankin (Stroke Patients Only)       Balance Overall balance assessment: Needs assistance Sitting-balance support: No upper extremity supported;Feet supported Sitting balance-Leahy Scale: Good     Standing balance support: No upper extremity supported;During functional activity Standing balance-Leahy Scale: Fair Standing balance comment: pt able to stand at mirror to comb hair without LOB or assistance                    Cognition Arousal/Alertness: Awake/alert Behavior During Therapy:  (very emotional) Overall Cognitive Status: Within Functional Limits for tasks assessed                      Exercises      General Comments General comments (skin integrity, edema, etc.): O2 desat to  85% on RA with ambulation, pt returned to 2L after session      Pertinent Vitals/Pain Pain Assessment: No/denies pain    Home Living                      Prior Function            PT Goals (current goals can now be found in the care plan section) Acute Rehab PT Goals Patient Stated Goal: return to [facility] PT Goal Formulation: With patient Time For Goal Achievement: 07/05/14 Potential to  Achieve Goals: Good Progress towards PT goals: Goals met/education completed, patient discharged from PT    Frequency  Min 2X/week    PT Plan Discharge plan needs to be updated;Frequency needs to be updated    Co-evaluation             End of Session Equipment Utilized During Treatment: Gait belt Activity Tolerance: Patient tolerated treatment well Patient left: in chair;with call bell/phone within reach     Time: 1106-1130 PT Time Calculation (min) (ACUTE ONLY): 24 min  Charges:  $Gait Training: 23-37 mins                    G Codes:     Leighton Roach, PT  Acute Rehab Services  [phone]  Leighton Roach 06/25/2014, 12:06 PM

## 2014-06-25 NOTE — Plan of Care (Signed)
Problem: Acute Rehab PT Goals(only PT should resolve) Goal: Pt Will Go Supine/Side To Sit Outcome: Completed/Met Date Met:  06/25/14 Goal: Patient Will Transfer Sit To/From Stand Outcome: Adequate for Discharge Goal: Pt Will Ambulate Outcome: Completed/Met Date Met:  06/25/14 Goal: Pt/caregiver will Perform Home Exercise Program Outcome: Adequate for Discharge

## 2014-06-25 NOTE — Progress Notes (Signed)
CSW (Clinical Child psychotherapistocial Worker) prepared pt dc packet and placed with shadow chart. CSW arranged non-emergent ambulance transport at 11:30am. Pt, pt family, pt nurse, and facility informed. CSW signing off.  Tyriek Hofman, LCSWA [phone]

## 2014-07-14 ENCOUNTER — Other Ambulatory Visit: Payer: Self-pay | Admitting: Internal Medicine

## 2014-08-04 ENCOUNTER — Other Ambulatory Visit: Payer: Self-pay | Admitting: Internal Medicine

## 2014-09-02 ENCOUNTER — Encounter: Payer: Self-pay | Admitting: Internal Medicine

## 2014-09-02 ENCOUNTER — Telehealth: Payer: Self-pay | Admitting: Internal Medicine

## 2014-09-02 ENCOUNTER — Ambulatory Visit (INDEPENDENT_AMBULATORY_CARE_PROVIDER_SITE_OTHER): Payer: Medicare Other | Admitting: Internal Medicine

## 2014-09-02 VITALS — BP 124/70 | HR 62 | Ht 67.0 in | Wt 148.0 lb

## 2014-09-02 DIAGNOSIS — R0600 Dyspnea, unspecified: Secondary | ICD-10-CM

## 2014-09-02 DIAGNOSIS — J9611 Chronic respiratory failure with hypoxia: Secondary | ICD-10-CM

## 2014-09-02 MED ORDER — CITRIC ACID-SODIUM CITRATE 334-500 MG/5ML PO SOLN: 30.0000 mL | Freq: Once | ORAL | Status: AC

## 2014-09-02 NOTE — Progress Notes (Signed)
Subjective:    Patient ID: Nicole Harding, female    DOB: 1923/11/16   MRN: [id number]    Brief patient profile:  74 yowf quit smoking 1952 with no resp problems until around  PE Oct 2013 incidentally discovered p fell and "breathing never right since" referred to pulmonary 06/21/13 by Dr Nicole Harding for copd eval but had no evidence of airflow obst on eval  07/29/13 and found to have met acidosis related to CRI better on oracit    History of Present Illness  06/21/2013 1st [facility] office visit/ Nicole Harding cc doe x 2 years x across the room Uses 02 at home @ 2lpm but can't do portable "too heavy" Does treadmill for up to half a mile per daughter s 48! Does not recall using inhalers to date. rec Pantoprazole (protonix) 40 mg   Take 30-60 min before first meal of the day and Pepcid 20 mg one bedtime until return     GERD Add oracit one tbsp daily due to met acidosis/ cri    07/18/2013 f/u ov/Nicole Harding re: uneplained sob assoc with CRI Chief Complaint  Patient presents with  . Follow-up    Pt states her breathing is slightly better since her last appt. No new co's today.   not using port 02 with activity  Stopped using 02 at hs  X 3-4 days > can't tell any difference Up to 3/4 mile now on treadmill in 30 min s 02  rec Continue Pantoprazole (protonix) 40 mg   Take 30-60 min before first meal of the day and Pepcid 20 mg one bedtime until return to office - this is the best way to tell whether stomach acid is contributing to your problem.  Please see patient coordinator before you leave today  to schedule overnight 02 sats Room Air  Please remember to go to the lab   department downstairs for your tests - we will call you with the results when they are available. Add : increase oracit to bid    Date of Admission: 06/20/2014 Date of Discharge: 06/25/2014  Discharge Diagnoses:  Near syncope   CKD (chronic kidney disease) stage 4, GFR 15-29 ml/min  Chronic respiratory failure   Malignant hypertension  Oxygen dependent  Lung mass  Pulmonary hypertensive arterial disease    09/02/2014 f/u ov/Nicole Harding re: copd/newly 02 dep x 2 weeks/ on oracit 15 cc per day  Chief Complaint  Patient presents with  . Follow-up    Pt states that her breathing has not improved since her last visit, but is no worse. She was started on supplental o2 24/7 approx 2 wks ago and feels that this has not really helped.  no treadmill x one year Living at [facility] walking from room to cafeteria s stopping and only uses the 02 with walking and sleeping  2lpm  No obvious day to day or daytime variabilty or assoc chronic cough or cp or chest tightness, subjective wheeze overt sinus or hb symptoms. No unusual exp hx or h/o childhood pna/ asthma or knowledge of premature birth.  Sleeping ok without nocturnal  or early am exacerbation  of respiratory  c/o's or need for noct saba. Also denies any obvious fluctuation of symptoms with weather or environmental changes or other aggravating or alleviating factors except as outlined above   Current Medications, Allergies, Complete Past Medical History, Past Surgical History, Family History, and Social History were reviewed in [employer] record.  ROS  The following are not active  complaints unless bolded sore throat, dysphagia, dental problems, itching, sneezing,  nasal congestion or excess/ purulent secretions, ear ache,   fever, chills, sweats, unintended wt loss, pleuritic or exertional cp, hemoptysis,  orthopnea pnd or leg swelling, presyncope, palpitations, heartburn, abdominal pain, anorexia, nausea, vomiting, diarrhea  or change in bowel or urinary habits, change in stools or urine, dysuria,hematuria,  rash, arthralgias, visual complaints, headache, numbness weakness or ataxia or problems with walking or coordination,  change in mood/affect or memory.              Objective:   Physical Exam  amb wf no longer needs  assistance to climb on table  07/18/2013      140  > 09/02/14  148  Wt Readings from Last 3 Encounters:  06/21/13 144 lb (65.318 kg)  06/11/13 143 lb (64.864 kg)  04/12/13 142 lb 8 oz (64.638 kg)       HEENT: nl dentition, turbinates, and orophanx. Nl external ear canals without cough reflex   NECK :  without JVD/Nodes/TM/ nl carotid upstrokes bilaterally   LUNGS: no acc muscle use, clear to A and P bilaterally without cough on insp or exp maneuvers   CV:  RRR  no s3 or murmur or increase in P2, no edema   ABD:  soft and nontender with nl excursion in the supine position. No bruits or organomegaly, bowel sounds nl  MS:  warm without deformities, calf tenderness, cyanosis or clubbing  SKIN: warm and dry without lesions    NEURO:  alert, approp, no deficits          CT s contrast images reviewed from 06/23/14 1. No hilar mass. Prominence noted on the current chest radiograph is due to a prominent right pulmonary artery. 2. Both pulmonary arteries are enlarged. Findings support pulmonary hypertension in the proper clinical setting. 3. There is also bronchial wall thickening. Although this is seen diffusely, is most prominent in the lower lobes and right middle lobe and lingula of the left upper lobe. This may all be chronic. Acute bronchial infection/inflammation should be considered likely in the proper clinical setting. There is mild associated linear lung base atelectasis. 4. No lung consolidation is seen to suggest pneumonia. Lungs are hyperexpanded consistent with COPD. 5. Multiple small lung nodules, largest measuring 3.5 mm       Chemistry      Component Value Date/Time   NA 137 06/25/2014 0437   K 4.4 06/25/2014 0437   CL 105 06/25/2014 0437   CO2 20 06/25/2014 0437   BUN 28* 06/25/2014 0437   CREATININE 1.87* 06/25/2014 0437      Component Value Date/Time   CALCIUM 8.5 06/25/2014 0437   ALKPHOS 80 12/02/2013 1044   AST 31 12/02/2013 1044   ALT 19  12/02/2013 1044   BILITOT 0.4 12/02/2013 1044         Assessment & Plan:

## 2014-09-02 NOTE — Telephone Encounter (Signed)
[facility] and clarified that rx for Oracit was for 30cc daily and ok to give 1 month supply.

## 2014-09-02 NOTE — Patient Instructions (Addendum)
Increase the oracit to 30 cc= 2 tbsp daily (can be taken at one time)    You do not need to use the 02 at rest, only with activity and sleeping @ 2lpm   Pulmonary follow up is as needed

## 2014-09-03 ENCOUNTER — Encounter: Payer: Self-pay | Admitting: Internal Medicine

## 2014-09-03 NOTE — Assessment & Plan Note (Signed)
06/21/2013   Walked RA x one lap @ 185 stopped due to  desat to 84%  - 07/18/2013 Walked RA xone  laps @ 185 ft each stopped due to  87% - 09/02/2014   Walked RA x one lap @ 185 stopped due to  88% one lap completely corrected on 2.lpm   rec 2lpm at hs and when walking more than across the room

## 2014-09-03 NOTE — Assessment & Plan Note (Signed)
-   V/q 03/02/13 low prob  - spirometry wnl 07/18/2013  - hc03 17 on 06/21/13 > rx oracit daily > 07/18/2013 = 19 so increased to bid  - HC03 20 on  06/25/14  On 15 cc daily so as of 09/03/2014 rec 30 cc daily to reduce wob

## 2014-09-30 ENCOUNTER — Emergency Department (HOSPITAL_COMMUNITY): Payer: Medicare Other

## 2014-09-30 ENCOUNTER — Emergency Department (HOSPITAL_COMMUNITY)
Admission: EM | Admit: 2014-09-30 | Discharge: 2014-09-30 | Disposition: A | Discharge status: Home / Self Care | Payer: Medicare Other | Attending: Emergency Medicine | Admitting: Emergency Medicine

## 2014-09-30 ENCOUNTER — Encounter (HOSPITAL_COMMUNITY): Payer: Self-pay | Admitting: Emergency Medicine

## 2014-09-30 DIAGNOSIS — S4992XA Unspecified injury of left shoulder and upper arm, initial encounter: Secondary | ICD-10-CM | POA: Insufficient documentation

## 2014-09-30 DIAGNOSIS — Z8739 Personal history of other diseases of the musculoskeletal system and connective tissue: Secondary | ICD-10-CM | POA: Insufficient documentation

## 2014-09-30 DIAGNOSIS — W1839XA Other fall on same level, initial encounter: Secondary | ICD-10-CM | POA: Insufficient documentation

## 2014-09-30 DIAGNOSIS — S8991XA Unspecified injury of right lower leg, initial encounter: Secondary | ICD-10-CM | POA: Insufficient documentation

## 2014-09-30 DIAGNOSIS — N184 Chronic kidney disease, stage 4 (severe): Secondary | ICD-10-CM | POA: Insufficient documentation

## 2014-09-30 DIAGNOSIS — Z86711 Personal history of pulmonary embolism: Secondary | ICD-10-CM | POA: Diagnosis not present

## 2014-09-30 DIAGNOSIS — R55 Syncope and collapse: Secondary | ICD-10-CM | POA: Insufficient documentation

## 2014-09-30 DIAGNOSIS — G20 Parkinson's disease: Secondary | ICD-10-CM | POA: Insufficient documentation

## 2014-09-30 DIAGNOSIS — Y9289 Other specified places as the place of occurrence of the external cause: Secondary | ICD-10-CM | POA: Insufficient documentation

## 2014-09-30 DIAGNOSIS — R52 Pain, unspecified: Secondary | ICD-10-CM

## 2014-09-30 DIAGNOSIS — Z79899 Other long term (current) drug therapy: Secondary | ICD-10-CM | POA: Diagnosis not present

## 2014-09-30 DIAGNOSIS — Z9181 History of falling: Secondary | ICD-10-CM | POA: Insufficient documentation

## 2014-09-30 DIAGNOSIS — S8992XA Unspecified injury of left lower leg, initial encounter: Secondary | ICD-10-CM | POA: Insufficient documentation

## 2014-09-30 DIAGNOSIS — Y9389 Activity, other specified: Secondary | ICD-10-CM | POA: Insufficient documentation

## 2014-09-30 DIAGNOSIS — Z87891 Personal history of nicotine dependence: Secondary | ICD-10-CM | POA: Diagnosis not present

## 2014-09-30 DIAGNOSIS — Z8639 Personal history of other endocrine, nutritional and metabolic disease: Secondary | ICD-10-CM | POA: Diagnosis not present

## 2014-09-30 DIAGNOSIS — Y998 Other external cause status: Secondary | ICD-10-CM | POA: Insufficient documentation

## 2014-09-30 DIAGNOSIS — Z8719 Personal history of other diseases of the digestive system: Secondary | ICD-10-CM | POA: Diagnosis not present

## 2014-09-30 DIAGNOSIS — I129 Hypertensive chronic kidney disease with stage 1 through stage 4 chronic kidney disease, or unspecified chronic kidney disease: Secondary | ICD-10-CM | POA: Insufficient documentation

## 2014-09-30 LAB — COMPREHENSIVE METABOLIC PANEL
ALT: 17 U/L (ref 0–35)
AST: 30 U/L (ref 0–37)
Albumin: 4.1 g/dL (ref 3.5–5.2)
Alkaline Phosphatase: 66 U/L (ref 39–117)
Anion gap: 8 (ref 5–15)
BUN: 40 mg/dL — ABNORMAL HIGH (ref 6–23)
CO2: 22 mmol/L (ref 19–32)
CREATININE: 1.96 mg/dL — AB (ref 0.50–1.10)
Calcium: 8.9 mg/dL (ref 8.4–10.5)
Chloride: 110 mmol/L (ref 96–112)
GFR calc Af Amer: 25 mL/min — ABNORMAL LOW (ref >90)
GFR calc non Af Amer: 22 mL/min — ABNORMAL LOW (ref >90)
GLUCOSE: 101 mg/dL — AB (ref 70–99)
POTASSIUM: 4.8 mmol/L (ref 3.5–5.1)
Sodium: 140 mmol/L (ref 135–145)
Total Bilirubin: 0.5 mg/dL (ref 0.3–1.2)
Total Protein: 6.9 g/dL (ref 6.0–8.3)

## 2014-09-30 LAB — CBC WITH DIFFERENTIAL/PLATELET
BASOS PCT: 0 % (ref 0–1)
Basophils Absolute: 0 10*3/uL (ref 0.0–0.1)
EOS ABS: 0.1 10*3/uL (ref 0.0–0.7)
Eosinophils Relative: 1 % (ref 0–5)
HEMATOCRIT: 41.3 % (ref 36.0–46.0)
HEMOGLOBIN: 13 g/dL (ref 12.0–15.0)
Lymphocytes Relative: 8 % — ABNORMAL LOW (ref 12–46)
Lymphs Abs: 0.8 10*3/uL (ref 0.7–4.0)
MCH: 28.6 pg (ref 26.0–34.0)
MCHC: 31.5 g/dL (ref 30.0–36.0)
MCV: 90.8 fL (ref 78.0–100.0)
MONO ABS: 0.7 10*3/uL (ref 0.1–1.0)
MONOS PCT: 8 % (ref 3–12)
NEUTROS ABS: 8 10*3/uL — AB (ref 1.7–7.7)
Neutrophils Relative %: 83 % — ABNORMAL HIGH (ref 43–77)
Platelets: 281 10*3/uL (ref 150–400)
RBC: 4.55 MIL/uL (ref 3.87–5.11)
RDW: 14 % (ref 11.5–15.5)
WBC: 9.6 10*3/uL (ref 4.0–10.5)

## 2014-09-30 LAB — TROPONIN I

## 2014-09-30 MED ORDER — SODIUM CHLORIDE 0.9 % IV BOLUS (SEPSIS)
500.0000 mL | Freq: Once | INTRAVENOUS | Status: AC
  Administered 2014-09-30: 500 mL via INTRAVENOUS

## 2014-09-30 NOTE — Discharge Instructions (Signed)
Follow up with Dr. Link SnufferHolwerda tomorrow at 940am.   Have [facility] arrange transportaion.   If they can not take you in the morning,  Dr. Link SnufferHolwerda said you can be seen in the pm tomorrow

## 2014-09-30 NOTE — ED Notes (Signed)
Bed: WA01 Expected date:  Expected time:  Means of arrival:  Comments: Ems- 81 yo F, fall

## 2014-09-30 NOTE — ED Notes (Signed)
Per ems pt from [facility]. Per ems per pt fell in th ekitchen, lost her balance landed on knees then left arm and shoulder. Denies loc or head inj. No anticoagulant inatake. On 24h O2. Pt is alert and oriented.

## 2014-09-30 NOTE — ED Notes (Signed)
[facility] been called to transport pt back to facility.

## 2014-09-30 NOTE — ED Provider Notes (Signed)
CSN: [id number]     Arrival date & time 09/30/14  1221 History   First MD Initiated Contact with Patient 09/30/14 1255     Chief Complaint  Patient presents with  . Fall     (Consider location/radiation/quality/duration/timing/severity/associated sxs/prior Treatment) Patient is a 81 y.o. female presenting with fall. The history is provided by the patient (the pt got dizzy and had a near syncopal episode today.  the pt states she did not pass out).  Fall This is a recurrent problem. The current episode started less than 1 hour ago. The problem occurs rarely. The problem has been resolved. Pertinent negatives include no chest pain, no abdominal pain and no headaches. Nothing aggravates the symptoms. Nothing relieves the symptoms. She has tried nothing for the symptoms.    Past Medical History  Diagnosis Date  . Hypertension   . History of orthostatic hypotension   . IBS (irritable bowel syndrome)   . Parkinson's disease     ATYPICAL  . Dizziness   . Vertigo   . Fatigue   . Advanced age   . HLD (hyperlipidemia)     intolerant to lipid lowering drugs  . Normal nuclear stress test 2003  . Fall at home 05/2012    mechanical fall  (06/01/2012)  . Pulmonary embolism 05/31/2012    off coumadin due to fall/SDH; has filter in place  . Chronic kidney disease, stage 4 (severe)   . Exertional dyspnea   . Arthritis     "legs, fingers" (06/01/2012)  . Subdural hematoma    Past Surgical History  Procedure Laterality Date  . Small intestine surgery    . Hemorrhoid surgery  1970    "have4 had accidents ever since" (06/01/2012)  . Abdominal hysterectomy  1970's  . Tonsillectomy  1954  . Cholecystectomy  1955?  Marland Kitchen Breast cyst excision  1970's    left  . Cataract extraction w/ intraocular lens  implant, bilateral  ? 1990's  . Shoulder open rotator cuff repair  1990?    right  . Appendectomy    . Joint replacement    . Eye surgery     Family History  Problem Relation Age of Onset  .  Stroke Father   . Emphysema Brother     smoked  . Heart disease      siblings   History  Substance Use Topics  . Smoking status: Former Smoker -- 1.00 packs/day for 20 years    Types: Cigarettes    Quit date: 02/14/1971  . Smokeless tobacco: Never Used  . Alcohol Use: No   OB History    No data available     Review of Systems  Constitutional: Negative for appetite change and fatigue.  HENT: Negative for congestion, ear discharge and sinus pressure.   Eyes: Negative for discharge.  Respiratory: Negative for cough.   Cardiovascular: Negative for chest pain.  Gastrointestinal: Negative for abdominal pain and diarrhea.  Genitourinary: Negative for frequency and hematuria.  Musculoskeletal: Negative for back pain.  Skin: Negative for rash.  Neurological: Positive for dizziness. Negative for seizures and headaches.  Psychiatric/Behavioral: Negative for hallucinations.      Allergies  Amlodipine; Codeine; Lipitor; Lisinopril; Prozac; Red yeast rice; Zetia; and Zoloft  Home Medications   Prior to Admission medications   Medication Sig Start Date End Date Taking? Authorizing Provider  citric acid-sodium citrate (ORACIT) 334-500 MG/5ML solution Take 30 mLs by mouth once. Patient taking differently: Take 30 mLs by mouth daily.  09/02/14  Yes Michael B Wert, MD  famotidine (PEPNyoka CowdenID) 20 MG tablet Take 20 mg by mouth at bedtime.   Yes Historical Provider, MD  feeding supplement, ENSURE COMPLETE, (ENSURE COMPLETE) LIQD Take 237 mLs by mouth 2 (two) times daily between meals. 06/25/14  Yes Alysia PennaScott Holwerda, MD  hydrALAZINE (APRESOLINE) 25 MG tablet Take 25 mg by mouth 4 (four) times daily.   Yes Historical Provider, MD  isosorbide mononitrate (IMDUR) 60 MG 24 hr tablet Take 1 tablet (60 mg total) by mouth daily. 06/25/14  Yes Alysia PennaScott Holwerda, MD  metoprolol succinate (TOPROL-XL) 25 MG 24 hr tablet Take 25 mg by mouth 2 (two) times daily. And  If blood pressure exceeds 150, take additional  tablet   Yes Historical Provider, MD  pantoprazole (PROTONIX) 40 MG tablet TAKE ONE TABLET 30 TO 60 MINUTES BEFORE FIRST MEAL OF THE DAY 07/14/14  Yes Nyoka CowdenMichael B Wert, MD   BP 204/82 mmHg  Pulse 57  Temp(Src) 97.7 F (36.5 C)  Resp 18  SpO2 100% Physical Exam  Constitutional: She is oriented to person, place, and time. She appears well-developed.  HENT:  Head: Normocephalic.  Eyes: Conjunctivae and EOM are normal. No scleral icterus.  Neck: Neck supple. No thyromegaly present.  Cardiovascular: Normal rate and regular rhythm.  Exam reveals no gallop and no friction rub.   No murmur heard. Pulmonary/Chest: No stridor. She has no wheezes. She has no rales. She exhibits no tenderness.  Abdominal: She exhibits no distension. There is no tenderness. There is no rebound.  Musculoskeletal: Normal range of motion. She exhibits no edema.  Lymphadenopathy:    She has no cervical adenopathy.  Neurological: She is oriented to person, place, and time. She exhibits normal muscle tone. Coordination normal.  Skin: No rash noted. No erythema.  Psychiatric: She has a normal mood and affect. Her behavior is normal.    ED Course  Procedures (including critical care time) Labs Review Labs Reviewed  CBC WITH DIFFERENTIAL/PLATELET - Abnormal; Notable for the following:    Neutrophils Relative % 83 (*)    Neutro Abs 8.0 (*)    Lymphocytes Relative 8 (*)    All other components within normal limits  COMPREHENSIVE METABOLIC PANEL - Abnormal; Notable for the following:    Glucose, Bld 101 (*)    BUN 40 (*)    Creatinine, Ser 1.96 (*)    GFR calc non Af Amer 22 (*)    GFR calc Af Amer 25 (*)    All other components within normal limits  TROPONIN I    Imaging Review Ct Head Wo Contrast  09/30/2014   CLINICAL DATA:  Pain following fall  EXAM: CT HEAD WITHOUT CONTRAST  TECHNIQUE: Contiguous axial images were obtained from the base of the skull through the vertex without intravenous contrast.   COMPARISON:  December 02, 2013  FINDINGS: Moderate diffuse atrophy is stable. There is no intracranial mass, hemorrhage, extra-axial fluid collection, or midline shift. There is small vessel disease throughout the centra semiovale bilaterally there is evidence of a prior small lacunar infarct in the anterior limb of the right internal capsule. There is no new gray-white compartment lesion. No acute infarct apparent. Bony calvarium appears intact. The mastoid air cells are clear. There is sphenoid sinus disease with scattered areas of calcification in the left sphenoid sinus, stable.  IMPRESSION: Atrophy with periventricular small vessel disease, stable. Prior small lacunar infarct right internal capsule anteriorly. No acute infarct apparent. No hemorrhage or mass effect. No extra-axial fluid  collection. Chronic sphenoid sinus disease.   Electronically Signed   By: Bretta Bang III M.D.   On: 09/30/2014 14:10   Dg Chest Port 1 View  09/30/2014   CLINICAL DATA:  Chest pain after fall  EXAM: PORTABLE CHEST - 1 VIEW  COMPARISON:  Chest radiograph June 20, 2014 and chest CT June 23, 2014  FINDINGS: There is no edema or consolidation. There is a calcified granuloma in each upper lobe. Heart size is normal. There is a prominent right main pulmonary artery with rapid peripheral tapering. Question a degree of pulmonary arterial hypertension. No adenopathy. No bone lesions. No pneumothorax.  IMPRESSION: Question pulmonary arterial hypertension. Scattered calcified granulomas. No edema or consolidation. No pneumothorax.   Electronically Signed   By: Bretta Bang III M.D.   On: 09/30/2014 13:34     EKG Interpretation   Date/Time:  Tuesday September 30 2014 12:50:25 EST Ventricular Rate:  54 PR Interval:  260 QRS Duration: 80 QT Interval:  460 QTC Calculation: 436 R Axis:   1 Text Interpretation:  Sinus rhythm Prolonged PR interval RSR' in V1 or V2,  probably normal variant Baseline wander in  lead(s) V2 Confirmed by Alarik Radu   MD, Jomarie Longs [id number]) on 09/30/2014 1:03:33 PM      MDM   Final diagnoses:  Pain  Near syncope    Near syncope,   I spoke with Dr. Link Snuffer and felt the pt could go home and follow up with him tomorrow am.   The pt prefers to go home and see her md tomorrow    Benny Lennert, MD 09/30/14 1524

## 2015-02-02 ENCOUNTER — Other Ambulatory Visit: Payer: Self-pay

## 2015-05-27 ENCOUNTER — Other Ambulatory Visit: Payer: Self-pay | Admitting: Internal Medicine

## 2015-05-27 DIAGNOSIS — R413 Other amnesia: Secondary | ICD-10-CM

## 2015-05-28 ENCOUNTER — Other Ambulatory Visit: Payer: Medicare Other

## 2015-06-04 ENCOUNTER — Other Ambulatory Visit: Payer: Medicare Other

## 2015-06-09 ENCOUNTER — Other Ambulatory Visit: Payer: Medicare Other

## 2015-06-10 ENCOUNTER — Ambulatory Visit
Admission: RE | Admit: 2015-06-10 | Discharge: 2015-06-10 | Disposition: A | Discharge status: Home / Self Care | Payer: Medicare Other | Source: Ambulatory Visit | Attending: Internal Medicine | Admitting: Internal Medicine

## 2015-06-10 DIAGNOSIS — R413 Other amnesia: Secondary | ICD-10-CM

## 2015-07-04 ENCOUNTER — Emergency Department (HOSPITAL_COMMUNITY)
Admission: EM | Admit: 2015-07-04 | Discharge: 2015-07-05 | Disposition: A | Discharge status: Home / Self Care | Payer: Medicare Other | Attending: Emergency Medicine | Admitting: Emergency Medicine

## 2015-07-04 ENCOUNTER — Encounter (HOSPITAL_COMMUNITY): Payer: Self-pay | Admitting: Emergency Medicine

## 2015-07-04 DIAGNOSIS — G20 Parkinson's disease: Secondary | ICD-10-CM | POA: Insufficient documentation

## 2015-07-04 DIAGNOSIS — Z8719 Personal history of other diseases of the digestive system: Secondary | ICD-10-CM | POA: Insufficient documentation

## 2015-07-04 DIAGNOSIS — Z8739 Personal history of other diseases of the musculoskeletal system and connective tissue: Secondary | ICD-10-CM | POA: Diagnosis not present

## 2015-07-04 DIAGNOSIS — Z8639 Personal history of other endocrine, nutritional and metabolic disease: Secondary | ICD-10-CM | POA: Insufficient documentation

## 2015-07-04 DIAGNOSIS — Z86711 Personal history of pulmonary embolism: Secondary | ICD-10-CM | POA: Diagnosis not present

## 2015-07-04 DIAGNOSIS — Z87891 Personal history of nicotine dependence: Secondary | ICD-10-CM | POA: Diagnosis not present

## 2015-07-04 DIAGNOSIS — R55 Syncope and collapse: Secondary | ICD-10-CM

## 2015-07-04 DIAGNOSIS — N184 Chronic kidney disease, stage 4 (severe): Secondary | ICD-10-CM | POA: Diagnosis not present

## 2015-07-04 DIAGNOSIS — H9190 Unspecified hearing loss, unspecified ear: Secondary | ICD-10-CM | POA: Diagnosis not present

## 2015-07-04 DIAGNOSIS — R531 Weakness: Secondary | ICD-10-CM | POA: Diagnosis present

## 2015-07-04 DIAGNOSIS — Z9181 History of falling: Secondary | ICD-10-CM | POA: Insufficient documentation

## 2015-07-04 DIAGNOSIS — Z79899 Other long term (current) drug therapy: Secondary | ICD-10-CM | POA: Insufficient documentation

## 2015-07-04 DIAGNOSIS — I129 Hypertensive chronic kidney disease with stage 1 through stage 4 chronic kidney disease, or unspecified chronic kidney disease: Secondary | ICD-10-CM | POA: Insufficient documentation

## 2015-07-04 DIAGNOSIS — N390 Urinary tract infection, site not specified: Secondary | ICD-10-CM | POA: Diagnosis not present

## 2015-07-04 LAB — URINE MICROSCOPIC-ADD ON: RBC / HPF: NONE SEEN RBC/hpf (ref 0–5)

## 2015-07-04 LAB — CBC WITH DIFFERENTIAL/PLATELET
BASOS ABS: 0.1 10*3/uL (ref 0.0–0.1)
Basophils Relative: 1 %
EOS PCT: 2 %
Eosinophils Absolute: 0.1 10*3/uL (ref 0.0–0.7)
HCT: 38.2 % (ref 36.0–46.0)
HEMOGLOBIN: 12.5 g/dL (ref 12.0–15.0)
LYMPHS ABS: 1.6 10*3/uL (ref 0.7–4.0)
Lymphocytes Relative: 19 %
MCH: 30 pg (ref 26.0–34.0)
MCHC: 32.7 g/dL (ref 30.0–36.0)
MCV: 91.6 fL (ref 78.0–100.0)
Monocytes Absolute: 0.6 10*3/uL (ref 0.1–1.0)
Monocytes Relative: 7 %
NEUTROS PCT: 71 %
Neutro Abs: 5.8 10*3/uL (ref 1.7–7.7)
PLATELETS: 287 10*3/uL (ref 150–400)
RBC: 4.17 MIL/uL (ref 3.87–5.11)
RDW: 14.1 % (ref 11.5–15.5)
WBC: 8.1 10*3/uL (ref 4.0–10.5)

## 2015-07-04 LAB — BASIC METABOLIC PANEL
Anion gap: 9 (ref 5–15)
BUN: 44 mg/dL — AB (ref 6–20)
CO2: 18 mmol/L — ABNORMAL LOW (ref 22–32)
CREATININE: 2.05 mg/dL — AB (ref 0.44–1.00)
Calcium: 8.5 mg/dL — ABNORMAL LOW (ref 8.9–10.3)
Chloride: 109 mmol/L (ref 101–111)
GFR calc Af Amer: 24 mL/min — ABNORMAL LOW (ref >60)
GFR, EST NON AFRICAN AMERICAN: 20 mL/min — AB (ref >60)
Glucose, Bld: 87 mg/dL (ref 65–99)
Potassium: 4.7 mmol/L (ref 3.5–5.1)
SODIUM: 136 mmol/L (ref 135–145)

## 2015-07-04 LAB — URINALYSIS, ROUTINE W REFLEX MICROSCOPIC
Bilirubin Urine: NEGATIVE
Glucose, UA: NEGATIVE mg/dL
HGB URINE DIPSTICK: NEGATIVE
Ketones, ur: NEGATIVE mg/dL
Nitrite: NEGATIVE
PROTEIN: NEGATIVE mg/dL
Specific Gravity, Urine: 1.013 (ref 1.005–1.030)
pH: 5.5 (ref 5.0–8.0)

## 2015-07-04 LAB — TROPONIN I

## 2015-07-04 LAB — CBG MONITORING, ED: GLUCOSE-CAPILLARY: 81 mg/dL (ref 65–99)

## 2015-07-04 MED ORDER — CEPHALEXIN 500 MG PO CAPS: 500.0000 mg | ORAL_CAPSULE | Freq: Four times a day (QID) | ORAL | Status: AC

## 2015-07-04 NOTE — ED Notes (Signed)
Pt became very unsteady and shaky while standing during orthostatic VS RN notified

## 2015-07-04 NOTE — ED Provider Notes (Signed)
Medical screening examination/treatment/procedure(s) were conducted as a shared visit with non-physician practitioner(s) and myself.  I personally evaluated the patient during the encounter.   EKG Interpretation   Date/Time:  Saturday July 04 2015 18:42:39 EST Ventricular Rate:  54 PR Interval:  239 QRS Duration: 81 QT Interval:  459 QTC Calculation: 435 R Axis:   12 Text Interpretation:  Sinus rhythm Prolonged PR interval Baseline wander  in lead(s) V5 No significant change since last tracing Confirmed by Glendel Jaggers   MD, Nithila Sumners ([id number]) on 07/04/2015 7:14:51 PM     Patient here after having episode of weakness. History of same. His baseline at this time. Neurological exam is nonfocal. Awaiting urinalysis results and likely will discharge  Lorre NickAnthony Zyara Riling, MD 07/04/15 2313

## 2015-07-04 NOTE — ED Provider Notes (Signed)
CSN: [id number]     Arrival date & time 07/04/15  1814 History   First MD Initiated Contact with Patient 07/04/15 1838     Chief Complaint  Patient presents with  . Weakness     (Consider location/radiation/quality/duration/timing/severity/associated sxs/prior Treatment) Patient is a 81 y.o. female presenting with weakness. The history is provided by the patient and medical records.  Weakness Associated symptoms include weakness.    81 year old female with history of hypertension, IBS, Parkinson's disease, vertigo, hyperlipidemia, presenting to the ED following a near syncopal event. Patient has history of the same. Son reports that he called to check on her around noon, she seemed slightly confused, however she was not wearing her hearing aids.  They then got notification an hour later that she had pressed her life alert button. Patient states she was in her room for most of the day sitting in her chair. She states she got up to go to the cafeteria at her living facility, however it was closed. Staff did make her a grilled cheese sandwich and coffee which she ate but has had very little oral intake otherwise today.  She states she began to feel like she may pass out but no one was around so she pressed her life alert button. She was sitting in the chair, did not lose consciousness, fall to the ground, or strike her head. Initially on EMS arrival patient appeared to not answer questions, however patient states she did not hear anyone talking to her (she still does not have her hearing aids in).  She states she feels fine currently.  Family does report she had a UTI a few months ago as well.  She had a CT scan done a few weeks ago which showed an old stroke, nothing acute.  Past Medical History  Diagnosis Date  . Hypertension   . History of orthostatic hypotension   . IBS (irritable bowel syndrome)   . Parkinson's disease     ATYPICAL  . Dizziness   . Vertigo   . Fatigue   . Advanced age    . HLD (hyperlipidemia)     intolerant to lipid lowering drugs  . Normal nuclear stress test 2003  . Fall at home 05/2012    mechanical fall  (06/01/2012)  . Pulmonary embolism (HCC) 05/31/2012    off coumadin due to fall/SDH; has filter in place  . Chronic kidney disease, stage 4 (severe)   . Exertional dyspnea   . Arthritis     "legs, fingers" (06/01/2012)  . Subdural hematoma [facility])    Past Surgical History  Procedure Laterality Date  . Small intestine surgery    . Hemorrhoid surgery  1970    "have4 had accidents ever since" (06/01/2012)  . Abdominal hysterectomy  1970's  . Tonsillectomy  1954  . Cholecystectomy  1955?  Marland Kitchen Breast cyst excision  1970's    left  . Cataract extraction w/ intraocular lens  implant, bilateral  ? 1990's  . Shoulder open rotator cuff repair  1990?    right  . Appendectomy    . Joint replacement    . Eye surgery     Family History  Problem Relation Age of Onset  . Stroke Father   . Emphysema Brother     smoked  . Heart disease      siblings   Social History  Substance Use Topics  . Smoking status: Former Smoker -- 1.00 packs/day for 20 years    Types: Cigarettes  Quit date: 02/14/1971  . Smokeless tobacco: Never Used  . Alcohol Use: No   OB History    No data available     Review of Systems  Neurological: Positive for syncope (near syncope) and weakness.  All other systems reviewed and are negative.     Allergies  Amlodipine; Codeine; Lipitor; Lisinopril; Prozac; Red yeast rice; Zetia; and Zoloft  Home Medications   Prior to Admission medications   Medication Sig Start Date End Date Taking? Authorizing Provider  citric acid-sodium citrate (ORACIT) 334-500 MG/5ML solution Take 30 mLs by mouth once. Patient taking differently: Take 30 mLs by mouth daily.  09/02/14   Nyoka CowdenMichael B Wert, MD  famotidine (PEPCID) 20 MG tablet Take 20 mg by mouth at bedtime.    Historical Provider, MD  feeding supplement, ENSURE COMPLETE, (ENSURE  COMPLETE) LIQD Take 237 mLs by mouth 2 (two) times daily between meals. 06/25/14   Alysia PennaScott Holwerda, MD  hydrALAZINE (APRESOLINE) 25 MG tablet Take 25 mg by mouth 4 (four) times daily.    Historical Provider, MD  isosorbide mononitrate (IMDUR) 60 MG 24 hr tablet Take 1 tablet (60 mg total) by mouth daily. 06/25/14   Alysia PennaScott Holwerda, MD  metoprolol succinate (TOPROL-XL) 25 MG 24 hr tablet Take 25 mg by mouth 2 (two) times daily. And  If blood pressure exceeds 150, take additional tablet    Historical Provider, MD  pantoprazole (PROTONIX) 40 MG tablet TAKE ONE TABLET 30 TO 60 MINUTES BEFORE FIRST MEAL OF THE DAY 07/14/14   Nyoka CowdenMichael B Wert, MD   BP 173/70 mmHg  Pulse 52  Resp 20  SpO2 98%   Physical Exam  Constitutional: She is oriented to person, place, and time. She appears well-developed and well-nourished. No distress.  Hard of hearing  HENT:  Head: Normocephalic and atraumatic.  Mouth/Throat: Oropharynx is clear and moist.  Eyes: Conjunctivae and EOM are normal. Pupils are equal, round, and reactive to light.  Neck: Normal range of motion. Neck supple.  Cardiovascular: Normal rate, regular rhythm and normal heart sounds.   Pulmonary/Chest: Effort normal and breath sounds normal. No respiratory distress. She has no wheezes.  Abdominal: Soft. Bowel sounds are normal. There is no tenderness. There is no guarding.  Musculoskeletal: Normal range of motion.  Neurological: She is alert and oriented to person, place, and time.  AAOx3 once spoken to very loudly, answering questions appropriately; equal strength UE and LE bilaterally; CN grossly intact; moves all extremities appropriately without ataxia; no focal neuro deficits or facial asymmetry appreciated  Skin: Skin is warm and dry. She is not diaphoretic.  Psychiatric: She has a normal mood and affect.  Nursing note and vitals reviewed.   ED Course  Procedures (including critical care time) Labs Review Labs Reviewed  BASIC METABOLIC PANEL  - Abnormal; Notable for the following:    CO2 18 (*)    BUN 44 (*)    Creatinine, Ser 2.05 (*)    Calcium 8.5 (*)    GFR calc non Af Amer 20 (*)    GFR calc Af Amer 24 (*)    All other components within normal limits  URINALYSIS, ROUTINE W REFLEX MICROSCOPIC ([facility]) - Abnormal; Notable for the following:    APPearance CLOUDY (*)    Leukocytes, UA MODERATE (*)    All other components within normal limits  URINE MICROSCOPIC-ADD ON - Abnormal; Notable for the following:    Squamous Epithelial / LPF 0-5 (*)    Bacteria, UA MANY (*)  All other components within normal limits  URINE CULTURE  CBC WITH DIFFERENTIAL/PLATELET  TROPONIN I  CBG MONITORING, ED    Imaging Review No results found. I have personally reviewed and evaluated these images and lab results as part of my medical decision-making.   EKG Interpretation None      MDM   Final diagnoses:  UTI (lower urinary tract infection)  Near syncope   81 year old female here with near-syncope. There was question of altered mental status with EMS, however patient does not have her hearing aids in. Once spoken to very loudly, she is answering questions and following commands appropriately. It appears that she did not eat very much throughout the day today, son reports she has a history of near-syncope secondary to the same. She has been seen in the ED multiple times for this as well. Workup as above-- UA appears infectious which may be contributing to her symptoms. Neurologic exam is nonfocal here today, recent CT scan by PCP which was negative aside from remote basal ganglia infarct.  Low suspicion for acute cardiac or neurologic process today. Patient remains alert and oriented here in the emergency department. Will discharge home on Keflex pending urine culture.  Patient and son in agreement with plan of care.  Transport back to facility via [facility].  Case discussed with attending physician, Dr. Freida Busman, who evaluated patient and  agrees with assessment and plan of care.  Garlon Hatchet, PA-C 07/04/15 [phone]

## 2015-07-04 NOTE — ED Notes (Signed)
Per EMS: Pt pressed her life alert button because she wasn't feeling well.  Life alert and EMS tried to talk to her but she would not answer them or follow commands.  Upon moving pt from chair to stretcher, it hurt her chronically painful shoulder and pt began talking.  States she remembered pressing her life alert because she didn't feel right and she needed help.  After that, went back into not being able to talk or follow commands.  EMS found a piece of paper in her house that she had written, "dizziness and fainting spells".

## 2015-07-04 NOTE — ED Notes (Signed)
Pt urinated for Marcella NT,  This writer discontinued order for in and out cath

## 2015-07-04 NOTE — ED Notes (Signed)
Pt aware she will be transported home by [facility],   Pt is alert and oriented in NAD,  She has her keys and cell phone at bedside

## 2015-07-04 NOTE — ED Notes (Signed)
Bed: WA13 Expected date:  Expected time:  Means of arrival:  Comments: weakness 

## 2015-07-04 NOTE — ED Notes (Signed)
Urine culture slip sent to lab,  Urine in lab from earlier send

## 2015-07-04 NOTE — ED Notes (Signed)
Pt was able to urinate on her own.

## 2015-07-04 NOTE — Discharge Instructions (Signed)
Take the prescribed medication as directed. °Follow-up with your primary care physician. °Return to the ED for new or worsening symptoms. ° °

## 2015-07-07 LAB — URINE CULTURE: Culture: >=100000

## 2015-07-08 ENCOUNTER — Telehealth (HOSPITAL_BASED_OUTPATIENT_CLINIC_OR_DEPARTMENT_OTHER): Payer: Self-pay | Admitting: Emergency Medicine

## 2015-07-08 NOTE — Telephone Encounter (Signed)
Post ED Visit - Positive Culture Follow-up  Culture report reviewed by antimicrobial stewardship pharmacist:  []  Enzo BiNathan Batchelder, Pharm.D. []  Celedonio MiyamotoJeremy Frens, Pharm.D., BCPS []  Garvin FilaMike Maccia, Pharm.D. []  Georgina PillionElizabeth Martin, Pharm.D., BCPS []  BerkeleyMinh Pham, [address]Pharm[address]D., BCPS, AAHIVP []  Estella HuskMichelle Turner, Pharm.D., BCPS, AAHIVP []  Tennis Mustassie Stewart, Pharm.D. [x]  Sherle Poeob Vincent, [address]Pharm[address]  Positive urine culture E. Coli Treated with cephalexin, organism sensitive to the same and no further patient follow-up is required at this time.  Berle MullMiller, Elige Shouse 07/08/2015, 12:37 PM

## 2015-07-17 ENCOUNTER — Inpatient Hospital Stay (HOSPITAL_COMMUNITY)
Admission: EM | Admit: 2015-07-17 | Discharge: 2015-08-09 | DRG: 871 | Disposition: E | Payer: Medicare Other | Attending: Internal Medicine | Admitting: Internal Medicine

## 2015-07-17 ENCOUNTER — Encounter (HOSPITAL_COMMUNITY): Payer: Self-pay | Admitting: Emergency Medicine

## 2015-07-17 ENCOUNTER — Inpatient Hospital Stay (HOSPITAL_COMMUNITY): Payer: Medicare Other

## 2015-07-17 ENCOUNTER — Emergency Department (HOSPITAL_COMMUNITY): Payer: Medicare Other

## 2015-07-17 DIAGNOSIS — Z90710 Acquired absence of both cervix and uterus: Secondary | ICD-10-CM | POA: Diagnosis not present

## 2015-07-17 DIAGNOSIS — K529 Noninfective gastroenteritis and colitis, unspecified: Secondary | ICD-10-CM | POA: Diagnosis present

## 2015-07-17 DIAGNOSIS — F419 Anxiety disorder, unspecified: Secondary | ICD-10-CM | POA: Diagnosis present

## 2015-07-17 DIAGNOSIS — R296 Repeated falls: Secondary | ICD-10-CM | POA: Diagnosis present

## 2015-07-17 DIAGNOSIS — Z9181 History of falling: Secondary | ICD-10-CM | POA: Diagnosis not present

## 2015-07-17 DIAGNOSIS — J209 Acute bronchitis, unspecified: Secondary | ICD-10-CM | POA: Diagnosis present

## 2015-07-17 DIAGNOSIS — N170 Acute kidney failure with tubular necrosis: Secondary | ICD-10-CM | POA: Diagnosis present

## 2015-07-17 DIAGNOSIS — F0390 Unspecified dementia without behavioral disturbance: Secondary | ICD-10-CM | POA: Diagnosis present

## 2015-07-17 DIAGNOSIS — N185 Chronic kidney disease, stage 5: Secondary | ICD-10-CM | POA: Diagnosis present

## 2015-07-17 DIAGNOSIS — E860 Dehydration: Secondary | ICD-10-CM | POA: Diagnosis present

## 2015-07-17 DIAGNOSIS — J22 Unspecified acute lower respiratory infection: Secondary | ICD-10-CM | POA: Diagnosis present

## 2015-07-17 DIAGNOSIS — E871 Hypo-osmolality and hyponatremia: Secondary | ICD-10-CM | POA: Diagnosis present

## 2015-07-17 DIAGNOSIS — Z79899 Other long term (current) drug therapy: Secondary | ICD-10-CM | POA: Diagnosis not present

## 2015-07-17 DIAGNOSIS — R1310 Dysphagia, unspecified: Secondary | ICD-10-CM | POA: Diagnosis present

## 2015-07-17 DIAGNOSIS — R451 Restlessness and agitation: Secondary | ICD-10-CM | POA: Diagnosis not present

## 2015-07-17 DIAGNOSIS — E872 Acidosis, unspecified: Secondary | ICD-10-CM | POA: Diagnosis present

## 2015-07-17 DIAGNOSIS — Z86711 Personal history of pulmonary embolism: Secondary | ICD-10-CM | POA: Diagnosis not present

## 2015-07-17 DIAGNOSIS — I10 Essential (primary) hypertension: Secondary | ICD-10-CM | POA: Diagnosis present

## 2015-07-17 DIAGNOSIS — E870 Hyperosmolality and hypernatremia: Secondary | ICD-10-CM | POA: Diagnosis not present

## 2015-07-17 DIAGNOSIS — R339 Retention of urine, unspecified: Secondary | ICD-10-CM | POA: Diagnosis not present

## 2015-07-17 DIAGNOSIS — I248 Other forms of acute ischemic heart disease: Secondary | ICD-10-CM | POA: Diagnosis present

## 2015-07-17 DIAGNOSIS — E785 Hyperlipidemia, unspecified: Secondary | ICD-10-CM | POA: Diagnosis present

## 2015-07-17 DIAGNOSIS — N189 Chronic kidney disease, unspecified: Secondary | ICD-10-CM

## 2015-07-17 DIAGNOSIS — K219 Gastro-esophageal reflux disease without esophagitis: Secondary | ICD-10-CM | POA: Diagnosis present

## 2015-07-17 DIAGNOSIS — R7989 Other specified abnormal findings of blood chemistry: Secondary | ICD-10-CM | POA: Diagnosis not present

## 2015-07-17 DIAGNOSIS — I272 Other secondary pulmonary hypertension: Secondary | ICD-10-CM | POA: Diagnosis present

## 2015-07-17 DIAGNOSIS — Z8679 Personal history of other diseases of the circulatory system: Secondary | ICD-10-CM | POA: Diagnosis not present

## 2015-07-17 DIAGNOSIS — Z91018 Allergy to other foods: Secondary | ICD-10-CM

## 2015-07-17 DIAGNOSIS — J9601 Acute respiratory failure with hypoxia: Secondary | ICD-10-CM

## 2015-07-17 DIAGNOSIS — G20 Parkinson's disease: Secondary | ICD-10-CM | POA: Diagnosis present

## 2015-07-17 DIAGNOSIS — J690 Pneumonitis due to inhalation of food and vomit: Secondary | ICD-10-CM | POA: Diagnosis present

## 2015-07-17 DIAGNOSIS — R6520 Severe sepsis without septic shock: Secondary | ICD-10-CM

## 2015-07-17 DIAGNOSIS — Z7952 Long term (current) use of systemic steroids: Secondary | ICD-10-CM | POA: Diagnosis not present

## 2015-07-17 DIAGNOSIS — R0600 Dyspnea, unspecified: Secondary | ICD-10-CM | POA: Diagnosis not present

## 2015-07-17 DIAGNOSIS — N179 Acute kidney failure, unspecified: Secondary | ICD-10-CM | POA: Diagnosis not present

## 2015-07-17 DIAGNOSIS — M1990 Unspecified osteoarthritis, unspecified site: Secondary | ICD-10-CM | POA: Diagnosis present

## 2015-07-17 DIAGNOSIS — Z888 Allergy status to other drugs, medicaments and biological substances status: Secondary | ICD-10-CM | POA: Diagnosis not present

## 2015-07-17 DIAGNOSIS — R778 Other specified abnormalities of plasma proteins: Secondary | ICD-10-CM

## 2015-07-17 DIAGNOSIS — R197 Diarrhea, unspecified: Secondary | ICD-10-CM | POA: Diagnosis present

## 2015-07-17 DIAGNOSIS — G9340 Encephalopathy, unspecified: Secondary | ICD-10-CM | POA: Diagnosis present

## 2015-07-17 DIAGNOSIS — I120 Hypertensive chronic kidney disease with stage 5 chronic kidney disease or end stage renal disease: Secondary | ICD-10-CM | POA: Diagnosis present

## 2015-07-17 DIAGNOSIS — Z515 Encounter for palliative care: Secondary | ICD-10-CM | POA: Diagnosis present

## 2015-07-17 DIAGNOSIS — Z885 Allergy status to narcotic agent status: Secondary | ICD-10-CM | POA: Diagnosis not present

## 2015-07-17 DIAGNOSIS — J9621 Acute and chronic respiratory failure with hypoxia: Secondary | ICD-10-CM | POA: Diagnosis present

## 2015-07-17 DIAGNOSIS — Z7401 Bed confinement status: Secondary | ICD-10-CM

## 2015-07-17 DIAGNOSIS — I480 Paroxysmal atrial fibrillation: Secondary | ICD-10-CM | POA: Diagnosis not present

## 2015-07-17 DIAGNOSIS — A419 Sepsis, unspecified organism: Principal | ICD-10-CM | POA: Diagnosis present

## 2015-07-17 DIAGNOSIS — Z66 Do not resuscitate: Secondary | ICD-10-CM | POA: Diagnosis present

## 2015-07-17 DIAGNOSIS — J440 Chronic obstructive pulmonary disease with acute lower respiratory infection: Secondary | ICD-10-CM | POA: Diagnosis present

## 2015-07-17 DIAGNOSIS — F05 Delirium due to known physiological condition: Secondary | ICD-10-CM | POA: Diagnosis present

## 2015-07-17 DIAGNOSIS — Z823 Family history of stroke: Secondary | ICD-10-CM | POA: Diagnosis not present

## 2015-07-17 DIAGNOSIS — Z7982 Long term (current) use of aspirin: Secondary | ICD-10-CM

## 2015-07-17 DIAGNOSIS — R0689 Other abnormalities of breathing: Secondary | ICD-10-CM

## 2015-07-17 DIAGNOSIS — Z87891 Personal history of nicotine dependence: Secondary | ICD-10-CM

## 2015-07-17 DIAGNOSIS — D72829 Elevated white blood cell count, unspecified: Secondary | ICD-10-CM | POA: Diagnosis not present

## 2015-07-17 DIAGNOSIS — Z825 Family history of asthma and other chronic lower respiratory diseases: Secondary | ICD-10-CM | POA: Diagnosis not present

## 2015-07-17 DIAGNOSIS — R0602 Shortness of breath: Secondary | ICD-10-CM | POA: Diagnosis present

## 2015-07-17 DIAGNOSIS — R109 Unspecified abdominal pain: Secondary | ICD-10-CM

## 2015-07-17 LAB — PROTIME-INR
INR: 1.46 (ref 0.00–1.49)
PROTHROMBIN TIME: 17.8 s — AB (ref 11.6–15.2)

## 2015-07-17 LAB — URINALYSIS, ROUTINE W REFLEX MICROSCOPIC
BILIRUBIN URINE: NEGATIVE
GLUCOSE, UA: NEGATIVE mg/dL
HGB URINE DIPSTICK: NEGATIVE
Ketones, ur: NEGATIVE mg/dL
Nitrite: NEGATIVE
Protein, ur: 100 mg/dL — AB
SPECIFIC GRAVITY, URINE: 1.019 (ref 1.005–1.030)
pH: 5.5 (ref 5.0–8.0)

## 2015-07-17 LAB — CBC WITH DIFFERENTIAL/PLATELET
BASOS ABS: 0 10*3/uL (ref 0.0–0.1)
BASOS PCT: 0 %
EOS ABS: 0 10*3/uL (ref 0.0–0.7)
Eosinophils Relative: 0 %
HCT: 39.8 % (ref 36.0–46.0)
Hemoglobin: 13.6 g/dL (ref 12.0–15.0)
LYMPHS ABS: 0.3 10*3/uL — AB (ref 0.7–4.0)
Lymphocytes Relative: 1 %
MCH: 30 pg (ref 26.0–34.0)
MCHC: 34.2 g/dL (ref 30.0–36.0)
MCV: 87.7 fL (ref 78.0–100.0)
Monocytes Absolute: 1.8 10*3/uL — ABNORMAL HIGH (ref 0.1–1.0)
Monocytes Relative: 7 %
NEUTROS ABS: 23.3 10*3/uL — AB (ref 1.7–7.7)
Neutrophils Relative %: 92 %
PLATELETS: 305 10*3/uL (ref 150–400)
RBC: 4.54 MIL/uL (ref 3.87–5.11)
RDW: 13.9 % (ref 11.5–15.5)
WBC: 25.4 10*3/uL — ABNORMAL HIGH (ref 4.0–10.5)

## 2015-07-17 LAB — BLOOD GAS, VENOUS
ACID-BASE DEFICIT: 10.7 mmol/L — AB (ref 0.0–2.0)
BICARBONATE: 15.3 meq/L — AB (ref 20.0–24.0)
O2 Saturation: 50.7 %
PCO2 VEN: 35.8 mmHg — AB (ref 45.0–50.0)
PO2 VEN: 32.1 mmHg (ref 30.0–45.0)
Patient temperature: 98.6
TCO2: 14.3 mmol/L (ref 0–100)
pH, Ven: 7.253 (ref 7.250–7.300)

## 2015-07-17 LAB — PROCALCITONIN: Procalcitonin: 14.54 ng/mL

## 2015-07-17 LAB — COMPREHENSIVE METABOLIC PANEL
ALBUMIN: 3.5 g/dL (ref 3.5–5.0)
ALK PHOS: 76 U/L (ref 38–126)
ALK PHOS: 67 U/L (ref 38–126)
ALT: 36 U/L (ref 14–54)
ALT: 34 U/L (ref 14–54)
ANION GAP: 13 (ref 5–15)
AST: 91 U/L — AB (ref 15–41)
AST: 81 U/L — ABNORMAL HIGH (ref 15–41)
Albumin: 3.1 g/dL — ABNORMAL LOW (ref 3.5–5.0)
Anion gap: 15 (ref 5–15)
BILIRUBIN TOTAL: 1.2 mg/dL (ref 0.3–1.2)
BILIRUBIN TOTAL: 0.9 mg/dL (ref 0.3–1.2)
BUN: 46 mg/dL — AB (ref 6–20)
BUN: 43 mg/dL — ABNORMAL HIGH (ref 6–20)
CALCIUM: 8.7 mg/dL — AB (ref 8.9–10.3)
CALCIUM: 7.9 mg/dL — AB (ref 8.9–10.3)
CO2: 14 mmol/L — AB (ref 22–32)
CO2: 14 mmol/L — ABNORMAL LOW (ref 22–32)
CREATININE: 2.64 mg/dL — AB (ref 0.44–1.00)
CREATININE: 2.4 mg/dL — AB (ref 0.44–1.00)
Chloride: 97 mmol/L — ABNORMAL LOW (ref 101–111)
Chloride: 101 mmol/L (ref 101–111)
GFR calc Af Amer: 17 mL/min — ABNORMAL LOW (ref >60)
GFR calc non Af Amer: 15 mL/min — ABNORMAL LOW (ref >60)
GFR calc non Af Amer: 17 mL/min — ABNORMAL LOW (ref >60)
GFR, EST AFRICAN AMERICAN: 19 mL/min — AB (ref >60)
GLUCOSE: 166 mg/dL — AB (ref 65–99)
GLUCOSE: 127 mg/dL — AB (ref 65–99)
Potassium: 4.3 mmol/L (ref 3.5–5.1)
Potassium: 3.8 mmol/L (ref 3.5–5.1)
SODIUM: 126 mmol/L — AB (ref 135–145)
Sodium: 128 mmol/L — ABNORMAL LOW (ref 135–145)
TOTAL PROTEIN: 6.9 g/dL (ref 6.5–8.1)
TOTAL PROTEIN: 6 g/dL — AB (ref 6.5–8.1)

## 2015-07-17 LAB — TROPONIN I
TROPONIN I: 0.73 ng/mL — AB (ref <0.031)
TROPONIN I: 1.07 ng/mL — AB (ref <0.031)

## 2015-07-17 LAB — BASIC METABOLIC PANEL
ANION GAP: 12 (ref 5–15)
BUN: 46 mg/dL — ABNORMAL HIGH (ref 6–20)
CALCIUM: 8.4 mg/dL — AB (ref 8.9–10.3)
CO2: 14 mmol/L — AB (ref 22–32)
CREATININE: 2.56 mg/dL — AB (ref 0.44–1.00)
Chloride: 103 mmol/L (ref 101–111)
GFR, EST AFRICAN AMERICAN: 18 mL/min — AB (ref >60)
GFR, EST NON AFRICAN AMERICAN: 16 mL/min — AB (ref >60)
GLUCOSE: 110 mg/dL — AB (ref 65–99)
Potassium: 4.4 mmol/L (ref 3.5–5.1)
Sodium: 129 mmol/L — ABNORMAL LOW (ref 135–145)

## 2015-07-17 LAB — URINE MICROSCOPIC-ADD ON

## 2015-07-17 LAB — I-STAT TROPONIN, ED: TROPONIN I, POC: 0.92 ng/mL — AB (ref 0.00–0.08)

## 2015-07-17 LAB — OSMOLALITY: Osmolality: 281 mOsm/kg (ref 275–295)

## 2015-07-17 LAB — APTT: aPTT: 31 seconds (ref 24–37)

## 2015-07-17 LAB — I-STAT CG4 LACTIC ACID, ED: Lactic Acid, Venous: 4.24 mmol/L (ref 0.5–2.0)

## 2015-07-17 LAB — LACTIC ACID, PLASMA: Lactic Acid, Venous: 2.3 mmol/L (ref 0.5–2.0)

## 2015-07-17 LAB — MRSA PCR SCREENING: MRSA BY PCR: NEGATIVE

## 2015-07-17 LAB — MAGNESIUM: Magnesium: 1.6 mg/dL — ABNORMAL LOW (ref 1.7–2.4)

## 2015-07-17 LAB — TSH: TSH: 1.111 u[IU]/mL (ref 0.350–4.500)

## 2015-07-17 MED ORDER — FAMOTIDINE IN NACL 20-0.9 MG/50ML-% IV SOLN
20.0000 mg | INTRAVENOUS | Status: DC
  Administered 2015-07-17 – 2015-07-19 (×3): 20 mg via INTRAVENOUS
  Filled 2015-07-17 (×3): qty 50

## 2015-07-17 MED ORDER — METHYLPREDNISOLONE SODIUM SUCC 125 MG IJ SOLR
60.0000 mg | Freq: Four times a day (QID) | INTRAMUSCULAR | Status: DC
  Administered 2015-07-17 – 2015-07-19 (×7): 60 mg via INTRAVENOUS
  Filled 2015-07-17 (×7): qty 2

## 2015-07-17 MED ORDER — SODIUM CHLORIDE 0.9 % IV SOLN
INTRAVENOUS | Status: DC
  Administered 2015-07-17: 15:00:00 via INTRAVENOUS

## 2015-07-17 MED ORDER — SODIUM CHLORIDE 0.9 % IV BOLUS (SEPSIS)
1000.0000 mL | Freq: Once | INTRAVENOUS | Status: AC
  Administered 2015-07-17: 1000 mL via INTRAVENOUS

## 2015-07-17 MED ORDER — PIPERACILLIN-TAZOBACTAM 3.375 G IVPB 30 MIN
3.3750 g | INTRAVENOUS | Status: AC
  Administered 2015-07-17: 3.375 g via INTRAVENOUS
  Filled 2015-07-17: qty 50

## 2015-07-17 MED ORDER — VANCOMYCIN HCL 500 MG IV SOLR
500.0000 mg | INTRAVENOUS | Status: AC
  Administered 2015-07-17: 500 mg via INTRAVENOUS
  Filled 2015-07-17: qty 500

## 2015-07-17 MED ORDER — VANCOMYCIN HCL IN DEXTROSE 1-5 GM/200ML-% IV SOLN: 1000.0000 mg | INTRAVENOUS | Status: DC

## 2015-07-17 MED ORDER — ACETAMINOPHEN 325 MG PO TABS
650.0000 mg | ORAL_TABLET | Freq: Four times a day (QID) | ORAL | Status: DC | PRN
  Administered 2015-07-18: 650 mg via ORAL
  Filled 2015-07-17: qty 2

## 2015-07-17 MED ORDER — PIPERACILLIN-TAZOBACTAM IN DEX 2-0.25 GM/50ML IV SOLN
2.2500 g | Freq: Four times a day (QID) | INTRAVENOUS | Status: DC
  Administered 2015-07-18 – 2015-07-19 (×7): 2.25 g via INTRAVENOUS
  Filled 2015-07-17 (×8): qty 50

## 2015-07-17 MED ORDER — METHYLPREDNISOLONE SODIUM SUCC 125 MG IJ SOLR
60.0000 mg | Freq: Four times a day (QID) | INTRAMUSCULAR | Status: DC
  Administered 2015-07-17: 60 mg via INTRAVENOUS
  Filled 2015-07-17: qty 2

## 2015-07-17 MED ORDER — HEPARIN SODIUM (PORCINE) 5000 UNIT/ML IJ SOLN
5000.0000 [IU] | Freq: Three times a day (TID) | INTRAMUSCULAR | Status: DC
  Administered 2015-07-17 – 2015-07-22 (×13): 5000 [IU] via SUBCUTANEOUS
  Filled 2015-07-17 (×18): qty 1

## 2015-07-17 MED ORDER — VANCOMYCIN HCL IN DEXTROSE 1-5 GM/200ML-% IV SOLN
1000.0000 mg | Freq: Once | INTRAVENOUS | Status: AC
  Administered 2015-07-17: 1000 mg via INTRAVENOUS
  Filled 2015-07-17: qty 200

## 2015-07-17 MED ORDER — ONDANSETRON HCL 4 MG/2ML IJ SOLN
4.0000 mg | Freq: Four times a day (QID) | INTRAMUSCULAR | Status: DC | PRN
  Administered 2015-07-19: 4 mg via INTRAVENOUS
  Filled 2015-07-17: qty 2

## 2015-07-17 MED ORDER — GUAIFENESIN ER 600 MG PO TB12
600.0000 mg | ORAL_TABLET | Freq: Two times a day (BID) | ORAL | Status: DC
  Filled 2015-07-17 (×3): qty 1

## 2015-07-17 MED ORDER — IPRATROPIUM-ALBUTEROL 0.5-2.5 (3) MG/3ML IN SOLN
3.0000 mL | Freq: Four times a day (QID) | RESPIRATORY_TRACT | Status: DC
  Administered 2015-07-17 – 2015-07-20 (×13): 3 mL via RESPIRATORY_TRACT
  Filled 2015-07-17 (×14): qty 3

## 2015-07-17 MED ORDER — FAMOTIDINE IN NACL 20-0.9 MG/50ML-% IV SOLN: 20.0000 mg | INTRAVENOUS | Status: DC

## 2015-07-17 MED ORDER — ASPIRIN EC 81 MG PO TBEC: 81.0000 mg | DELAYED_RELEASE_TABLET | Freq: Every day | ORAL | Status: DC

## 2015-07-17 NOTE — ED Notes (Signed)
[facility] EMS started on C-PAP enroute. Pt noted with congested nonproductive cough. Pt was dx with Bronchitis and given Zithromax x1 dose left. Pt was given Albuterol 10mg /Atrovent 0.5mg  neb tx.

## 2015-07-17 NOTE — ED Notes (Signed)
Patient portable x-ray at bedside.

## 2015-07-17 NOTE — ED Notes (Signed)
Bed: RESB Expected date:  Expected time:  Means of arrival:  Comments: Ems-fever, shortness of breath

## 2015-07-17 NOTE — ED Notes (Signed)
Awake. Verbally responsive. A/O x2 (self/place). Resp even and unlabored. No audible adventitious breath sounds noted. ABC's intact. SR on monitor. IV infusing ABT and NS without difficulty. Family at bedside.

## 2015-07-17 NOTE — ED Notes (Signed)
Pt had no reaction to ABT. 

## 2015-07-17 NOTE — ED Provider Notes (Signed)
CSN: [id number]     Arrival date & time 08-14-15  1042 History   First MD Initiated Contact with Patient 08-14-2015 1056     Chief Complaint  Patient presents with  . Shortness of Breath     (Consider location/radiation/quality/duration/timing/severity/associated sxs/prior Treatment) HPI Comments: 81yo female with history of CKD, hypertension, PE not on anticoagulation presents with concern for shortness of breath from her assisted living facility. Pt was recently diagnosed with bronchitis on Wednesday and started on zpack and steroids, however last night shortness of breath worsened.   SOB severe. Cough nonproductive, difficulty raising sputum. DIarrhea x2 this AM, foul smelling per daughter. 5 days of symptoms with significant worsening last night. Fatigue/sleepiness yesterday, less interactive. Lives in independent living with assisted living status   Past Medical History  Diagnosis Date  . Hypertension   . History of orthostatic hypotension   . IBS (irritable bowel syndrome)   . Parkinson's disease     ATYPICAL  . Dizziness   . Vertigo   . Fatigue   . Advanced age   . HLD (hyperlipidemia)     intolerant to lipid lowering drugs  . Normal nuclear stress test 2003  . Fall at home 05/2012    mechanical fall  (06/01/2012)  . Pulmonary embolism (HCC) 05/31/2012    off coumadin due to fall/SDH; has filter in place  . Chronic kidney disease, stage 4 (severe)   . Exertional dyspnea   . Arthritis     "legs, fingers" (06/01/2012)  . Subdural hematoma [facility])    Past Surgical History  Procedure Laterality Date  . Small intestine surgery    . Hemorrhoid surgery  1970    "have4 had accidents ever since" (06/01/2012)  . Abdominal hysterectomy  1970's  . Tonsillectomy  1954  . Cholecystectomy  1955?  Marland Kitchen Breast cyst excision  1970's    left  . Cataract extraction w/ intraocular lens  implant, bilateral  ? 1990's  . Shoulder open rotator cuff repair  1990?    right  . Appendectomy     . Joint replacement    . Eye surgery     Family History  Problem Relation Age of Onset  . Stroke Father   . Emphysema Brother     smoked  . Heart disease      siblings   Social History  Substance Use Topics  . Smoking status: Former Smoker -- 1.00 packs/day for 20 years    Types: Cigarettes    Quit date: 02/14/1971  . Smokeless tobacco: Never Used  . Alcohol Use: No   OB History    No data available     Review of Systems  Unable to perform ROS: Mental status change  Constitutional: Positive for fever, activity change, appetite change and fatigue.  Respiratory: Positive for cough and shortness of breath.   Cardiovascular: Negative for chest pain.  Gastrointestinal: Positive for diarrhea. Negative for abdominal pain.  Genitourinary: Negative for dysuria.      Allergies  Amlodipine; Codeine; Lipitor; Lisinopril; Prozac; Red yeast rice; Zetia; and Zoloft  Home Medications   Prior to Admission medications   Medication Sig Start Date End Date Taking? Authorizing Provider  albuterol (PROVENTIL HFA;VENTOLIN HFA) 108 (90 BASE) MCG/ACT inhaler Inhale 2 puffs into the lungs every 4 (four) hours as needed for wheezing or shortness of breath.   Yes Historical Provider, MD  aspirin 81 MG tablet Take 81 mg by mouth daily.   Yes Historical Provider, MD  azithromycin [facility])  250 MG tablet Take 1 tablet by mouth daily. For 5 days 07/15/15 07/19/15 Yes Historical Provider, MD  citric acid-sodium citrate (ORACIT) 334-500 MG/5ML solution Take 30 mLs by mouth once. Patient taking differently: Take 30 mLs by mouth daily.  09/02/14  Yes Nyoka CowdenMichael B Wert, MD  famotidine (PEPCID) 20 MG tablet Take 20 mg by mouth at bedtime.   Yes Historical Provider, MD  feeding supplement, ENSURE COMPLETE, (ENSURE COMPLETE) LIQD Take 237 mLs by mouth 2 (two) times daily between meals. 06/25/14  Yes Alysia PennaScott Holwerda, MD  GuaiFENesin (MUCUS RELIEF ADULT PO) Take 1 tablet by mouth every 4 (four) hours as needed  (congestion).   Yes Historical Provider, MD  hydrALAZINE (APRESOLINE) 25 MG tablet Take 25 mg by mouth 4 (four) times daily.   Yes Historical Provider, MD  isosorbide mononitrate (IMDUR) 60 MG 24 hr tablet Take 1 tablet (60 mg total) by mouth daily. 06/25/14  Yes Alysia PennaScott Holwerda, MD  metoprolol succinate (TOPROL-XL) 25 MG 24 hr tablet Take 25 mg by mouth 2 (two) times daily. And  If blood pressure exceeds 150, take additional tablet   Yes Historical Provider, MD  Multiple Vitamins-Minerals (HAIR/SKIN/NAILS PO) Take by mouth.   Yes Historical Provider, MD  multivitamin-iron-minerals-folic acid (CENTRUM) chewable tablet Chew 1 tablet by mouth daily.   Yes Historical Provider, MD  pantoprazole (PROTONIX) 40 MG tablet TAKE ONE TABLET 30 TO 60 MINUTES BEFORE FIRST MEAL OF THE DAY 07/14/14  Yes Nyoka CowdenMichael B Wert, MD  predniSONE (DELTASONE) 20 MG tablet Take 20 mg by mouth daily. 07/15/15 07/18/15 Yes Historical Provider, MD  cephALEXin (KEFLEX) 500 MG capsule Take 1 capsule (500 mg total) by mouth 4 (four) times daily. Patient not taking: Reported on 07/24/2015 07/04/15   Garlon HatchetLisa M Sanders, PA-C   BP 139/56 mmHg  Pulse 101  Temp(Src) 97.8 F (36.6 C) (Axillary)  Resp 26  Ht 5\' 7"  (1.702 m)  Wt 148 lb 9.4 oz (67.4 kg)  BMI 23.27 kg/m2  SpO2 98% Physical Exam  Constitutional: She appears well-developed. She appears listless. She appears toxic. She has a sickly appearance. She appears ill. She appears distressed.  HENT:  Head: Normocephalic and atraumatic.  Eyes: Conjunctivae and EOM are normal.  Neck: Normal range of motion.  Cardiovascular: Regular rhythm, normal heart sounds and intact distal pulses.  Tachycardia present.  Exam reveals no gallop and no friction rub.   No murmur heard. Pulmonary/Chest: Tachypnea noted. She is in respiratory distress. She has no wheezes. She has rhonchi (diffuse). She has no rales.  Abdominal: Soft. She exhibits no distension. There is no tenderness. There is no guarding.   Musculoskeletal: She exhibits no edema or tenderness.  Neurological: She appears listless.  Oriented to self, location, day of week, waxing/waning concenration and sleepiness and does not state month or year   Skin: Skin is warm and dry. No rash noted. She is not diaphoretic. No erythema.  Nursing note and vitals reviewed.   ED Course  Procedures (including critical care time) Labs Review Labs Reviewed  COMPREHENSIVE METABOLIC PANEL - Abnormal; Notable for the following:    Sodium 126 (*)    Chloride 97 (*)    CO2 14 (*)    Glucose, Bld 166 (*)    BUN 46 (*)    Creatinine, Ser 2.64 (*)    Calcium 8.7 (*)    AST 91 (*)    GFR calc non Af Amer 15 (*)    GFR calc Af Amer 17 (*)  All other components within normal limits  CBC WITH DIFFERENTIAL/PLATELET - Abnormal; Notable for the following:    WBC 25.4 (*)    Neutro Abs 23.3 (*)    Lymphs Abs 0.3 (*)    Monocytes Absolute 1.8 (*)    All other components within normal limits  URINALYSIS, ROUTINE W REFLEX MICROSCOPIC (NOT AT [facility]) - Abnormal; Notable for the following:    APPearance CLOUDY (*)    Protein, ur 100 (*)    Leukocytes, UA MODERATE (*)    All other components within normal limits  MAGNESIUM - Abnormal; Notable for the following:    Magnesium 1.6 (*)    All other components within normal limits  BLOOD GAS, VENOUS - Abnormal; Notable for the following:    pCO2, Ven 35.8 (*)    Bicarbonate 15.3 (*)    Acid-base deficit 10.7 (*)    All other components within normal limits  URINE MICROSCOPIC-ADD ON - Abnormal; Notable for the following:    Squamous Epithelial / LPF 6-30 (*)    Bacteria, UA MANY (*)    Casts HYALINE CASTS (*)    All other components within normal limits  COMPREHENSIVE METABOLIC PANEL - Abnormal; Notable for the following:    Sodium 128 (*)    CO2 14 (*)    Glucose, Bld 127 (*)    BUN 43 (*)    Creatinine, Ser 2.40 (*)    Calcium 7.9 (*)    Total Protein 6.0 (*)    Albumin 3.1 (*)    AST 81  (*)    GFR calc non Af Amer 17 (*)    GFR calc Af Amer 19 (*)    All other components within normal limits  LACTIC ACID, PLASMA - Abnormal; Notable for the following:    Lactic Acid, Venous 2.3 (*)    All other components within normal limits  PROTIME-INR - Abnormal; Notable for the following:    Prothrombin Time 17.8 (*)    All other components within normal limits  TROPONIN I - Abnormal; Notable for the following:    Troponin I 1.07 (*)    All other components within normal limits  TROPONIN I - Abnormal; Notable for the following:    Troponin I 0.73 (*)    All other components within normal limits  BASIC METABOLIC PANEL - Abnormal; Notable for the following:    Sodium 129 (*)    CO2 14 (*)    Glucose, Bld 110 (*)    BUN 46 (*)    Creatinine, Ser 2.56 (*)    Calcium 8.4 (*)    GFR calc non Af Amer 16 (*)    GFR calc Af Amer 18 (*)    All other components within normal limits  BASIC METABOLIC PANEL - Abnormal; Notable for the following:    Sodium 130 (*)    CO2 15 (*)    Glucose, Bld 121 (*)    BUN 48 (*)    Creatinine, Ser 2.74 (*)    Calcium 8.3 (*)    GFR calc non Af Amer 14 (*)    GFR calc Af Amer 17 (*)    All other components within normal limits  BASIC METABOLIC PANEL - Abnormal; Notable for the following:    Sodium 129 (*)    CO2 15 (*)    Glucose, Bld 157 (*)    BUN 49 (*)    Creatinine, Ser 2.87 (*)    Calcium 7.9 (*)  GFR calc non Af Amer 14 (*)    GFR calc Af Amer 16 (*)    All other components within normal limits  CBC WITH DIFFERENTIAL/PLATELET - Abnormal; Notable for the following:    WBC 18.0 (*)    RBC 3.82 (*)    Hemoglobin 11.2 (*)    HCT 33.6 (*)    Neutro Abs 16.2 (*)    Lymphs Abs 0.4 (*)    Monocytes Absolute 1.4 (*)    All other components within normal limits  I-STAT CG4 LACTIC ACID, ED - Abnormal; Notable for the following:    Lactic Acid, Venous 4.24 (*)    All other components within normal limits  I-STAT TROPOININ, ED -  Abnormal; Notable for the following:    Troponin i, poc 0.92 (*)    All other components within normal limits  URINE CULTURE  MRSA PCR SCREENING  CULTURE, BLOOD (ROUTINE X 2)  CULTURE, BLOOD (ROUTINE X 2)  CULTURE, EXPECTORATED SPUTUM-ASSESSMENT  GRAM STAIN  PROCALCITONIN  APTT  OSMOLALITY  TSH  OSMOLALITY, URINE  SODIUM, URINE, RANDOM  CREATININE, URINE, RANDOM    Imaging Review Ct Head Wo Contrast  07/18/2015  CLINICAL DATA:  81 year old who fell 3 days ago, presenting with acute mental status changes as evidenced by severe confusion. Patient also has vertigo and dizziness. Possible acute encephalopathy. Initial encounter. EXAM: CT HEAD WITHOUT CONTRAST TECHNIQUE: Contiguous axial images were obtained from the base of the skull through the vertex without intravenous contrast. COMPARISON:  CT head 06/10/2015 and earlier.  MRI brain 10/06/2009. FINDINGS: Patient motion degraded the images were true were repeated a total of 4 times. The final imaging sequence has less motion and is diagnostic. Severe cortical and deep atrophy, unchanged dating back to the 2011 MRI. Severe changes of small vessel disease of the white matter diffusely, including the brainstem and pons, also unchanged. Old lacunar type strokes in the basal ganglia bilaterally, right greater than left, unchanged. No mass lesion. No midline shift. No acute hemorrhage or hematoma. No extra-axial fluid collections. No evidence of acute infarction. No skull fracture or other focal osseous abnormality involving the skull. Air-fluid level in the maxillary sinuses, left greater than right, opacification of multiple bilateral ethmoid air cells, mucosal thickening and air- fluid level in the left sphenoid sinus. Bilateral mastoid air cells and middle ear cavities well-aerated. Extensive bilateral carotid siphon atherosclerosis. IMPRESSION: 1. Motion degraded examination demonstrates no acute intracranial abnormality. 2. Severe generalized  atrophy and severe chronic microvascular ischemic changes of the white matter, including the brainstem and pons, stable since 2011. 3. Acute bilateral maxillary, bilateral ethmoid and left sphenoid sinus disease, likely superimposed upon chronic sinusitis. Electronically Signed   By: Hulan Saas M.D.   On: 07/18/2015 09:58   Dg Chest Port 1 View  07/25/2015  CLINICAL DATA:  Shortness of breath and fever.  Cough. EXAM: PORTABLE CHEST 1 VIEW COMPARISON:  September 30, 2014 FINDINGS: There is no apparent edema or consolidation. Calcified granulomas bilaterally are stable. Heart size normal. Prominence of the central pulmonary arteries with rapid peripheral tapering remains. No adenopathy apparent. No bone lesions. IMPRESSION: Findings suggesting pulmonary arterial hypertension. No edema or consolidation. Scattered granulomas. Electronically Signed   By: Bretta Bang III M.D.   On: 08/07/2015 12:01   Dg Chest Port 1v Same Day  07/22/2015  CLINICAL DATA:  Dyspnea and respiratory abnormalities, worsened today around 5 p.m. EXAM: PORTABLE CHEST 1 VIEW COMPARISON:  Radiograph earlier this day. FINDINGS: Development of  patchy opacity at the right lung base. Left lung is clear. Heart is normal in size, prominence of both pulmonary hila again seen. No pleural effusion or pneumothorax. Calcified granuloma bilaterally are unchanged. IMPRESSION: Development of patchy opacity at the right lung base from earlier this day; aspiration, atelectasis, or developing pneumonia considered. Electronically Signed   By: Rubye Oaks M.D.   On: 07/24/2015 21:55   Dg Abd Portable 1v  07/22/2015  CLINICAL DATA:  Abdominal pain EXAM: PORTABLE ABDOMEN - 1 VIEW COMPARISON:  None. FINDINGS: Bowel gas pattern is unremarkable. No obstruction or free air is seen on this supine examination. Lung bases are clear. There is a filter in the inferior vena cava with the apex directed superiorly at the level of L2. There are small  vascular calcifications in the pelvis. IMPRESSION: Inferior vena cava filter present. Bowel gas pattern unremarkable. No obstruction or free air evident. Electronically Signed   By: Bretta Bang III M.D.   On: 07/09/2015 15:03   I have personally reviewed and evaluated these images and lab results as part of my medical decision-making.   EKG Interpretation   Date/Time:  Friday July 17 2015 11:12:10 EST Ventricular Rate:  99 PR Interval:  198 QRS Duration: 81 QT Interval:  348 QTC Calculation: 447 R Axis:   -27 Text Interpretation:  Sinus rhythm Borderline left axis deviation Probable  anteroseptal infarct, old ED PHYSICIAN INTERPRETATION AVAILABLE IN [facility] Confirmed by TEST, Record ([id number]) on 07/18/2015 9:20:40 AM      MDM   Final diagnoses:  Sepsis, due to unspecified organism (HCC)  Hyponatremia  Acute-on-chronic kidney injury (HCC)  Elevated troponin  Lactic acidosis  Metabolic acidosis  Acute respiratory failure with hypoxia [facility])   81yo female with history of CKD, hypertension, PE not on anticoagulation presents with concern for shortness of breath from her assisted living facility. Pt was recently diagnosed with bronchitis on Wednesday and started on zpack and steroids, however last night shortness of breath worsened.   Patient hypotensive, tachypneic, hypoxic, ill appearing, and febrile on arrival to ED. Code sepsis initiated, 2 IV established, given 2L of NS, vancomycin and zosyn.  By hx, concern for pneumonia however CXR without consolidation, urinalysis concerning for possible UTI as well.  Pt DNR/DNI. She is tachypneic however with normal oxygenation on 6L of O2.  BP improved after IVF. WBC greater than 20000. Lactic acid elevated however downtrending after fluids. Troponin elevation likely secondary to acute illness, kidney disease. Pt with acute on chronic kidney injury, hyponatremia.  Hospitalist to admit pt for concern for Sepsis likely secondary to  pneumonia vs UTI, metabolic acidosis, acute respiratory failure with hypoxia.     Daughter reports diarrhea, however pt with 2 episodes and low suspicion for cdiff at this time.    Alvira Monday, MD 07/18/15 1147

## 2015-07-17 NOTE — ED Notes (Signed)
Awake. Verbally responsive. A/O x2. Resp even and unlabored. Occ nonproductive cough noted. ABC's intact. SR on monitor. IV x2 saline lock patent and intact.

## 2015-07-17 NOTE — ED Notes (Signed)
Awake. Verbally responsive. A/O x2. Resp even and unlabored. Occ nonproductive cough noted. ABC's intact. SR on monitor. IV x2 saline lock patent and intact. Family at bedside. 

## 2015-07-17 NOTE — Progress Notes (Addendum)
ANTIBIOTIC CONSULT NOTE - INITIAL  Pharmacy Consult for Vancomycin, Zosyn Indication: Pneumonia  Allergies  Allergen Reactions  . Amlodipine Nausea And Vomiting and Swelling    Swelling --sick and ended up in hospital N&V  . Codeine Nausea And Vomiting  . Lipitor [Atorvastatin Calcium] Other (See Comments)    "cramps my legs"  . Lisinopril Other (See Comments)    unknown  . Prozac [Fluoxetine Hcl] Nausea And Vomiting    syncope  . Red Yeast Rice Other (See Comments)    unknown  . Zetia [Ezetimibe] Other (See Comments)    unknown  . Zoloft [Sertraline Hcl]     Extreme weakness    Patient Measurements: Height: 5\' 7"  (170.2 cm) Weight: 143 lb (64.864 kg) IBW/kg (Calculated) : 61.6  Vital Signs: Temp: 101.7 F (38.7 C) (12/09 1103) Temp Source: Rectal (12/09 1103) BP: 111/60 mmHg (12/09 1130) Pulse Rate: 93 (12/09 1130) Intake/Output from previous day:   Intake/Output from this shift:    Labs: No results for input(s): WBC, HGB, PLT, LABCREA, CREATININE in the last 72 hours. Estimated Creatinine Clearance: 18.1 mL/min (by C-G formula based on Cr of 2.05). No results for input(s): VANCOTROUGH, VANCOPEAK, VANCORANDOM, GENTTROUGH, GENTPEAK, GENTRANDOM, TOBRATROUGH, TOBRAPEAK, TOBRARND, AMIKACINPEAK, AMIKACINTROU, AMIKACIN in the last 72 hours.   Microbiology: Recent Results (from the past 720 hour(s))  Urine culture     Status: None   Collection Time: 07/04/15 10:41 PM  Result Value Ref Range Status   Specimen Description URINE, CLEAN CATCH  Final   Special Requests NONE  Final   Culture   Final    >=100,000 COLONIES/mL KLEBSIELLA PNEUMONIAE 30,000 COLONIES/mL ESCHERICHIA COLI Performed at [facility]    Report Status 07/07/2015 FINAL  Final   Organism ID, Bacteria KLEBSIELLA PNEUMONIAE  Final   Organism ID, Bacteria ESCHERICHIA COLI  Final      Susceptibility   Escherichia coli - MIC*    AMPICILLIN 16 INTERMEDIATE Intermediate     CEFAZOLIN <=4  SENSITIVE Sensitive     CEFTRIAXONE <=1 SENSITIVE Sensitive     CIPROFLOXACIN <=0.25 SENSITIVE Sensitive     GENTAMICIN <=1 SENSITIVE Sensitive     IMIPENEM <=0.25 SENSITIVE Sensitive     NITROFURANTOIN <=16 SENSITIVE Sensitive     TRIMETH/SULFA <=20 SENSITIVE Sensitive     AMPICILLIN/SULBACTAM 4 INTERMEDIATE Intermediate     PIP/TAZO <=4 SENSITIVE Sensitive     * 30,000 COLONIES/mL ESCHERICHIA COLI   Klebsiella pneumoniae - MIC*    AMPICILLIN >=32 RESISTANT Resistant     CEFAZOLIN <=4 SENSITIVE Sensitive     CEFTRIAXONE <=1 SENSITIVE Sensitive     CIPROFLOXACIN <=0.25 SENSITIVE Sensitive     GENTAMICIN <=1 SENSITIVE Sensitive     IMIPENEM <=0.25 SENSITIVE Sensitive     NITROFURANTOIN 32 SENSITIVE Sensitive     TRIMETH/SULFA <=20 SENSITIVE Sensitive     AMPICILLIN/SULBACTAM 4 SENSITIVE Sensitive     PIP/TAZO <=4 SENSITIVE Sensitive     * >=100,000 COLONIES/mL KLEBSIELLA PNEUMONIAE    Medical History: Past Medical History  Diagnosis Date  . Hypertension   . History of orthostatic hypotension   . IBS (irritable bowel syndrome)   . Parkinson's disease     ATYPICAL  . Dizziness   . Vertigo   . Fatigue   . Advanced age   . HLD (hyperlipidemia)     intolerant to lipid lowering drugs  . Normal nuclear stress test 2003  . Fall at home 05/2012    mechanical fall  (06/01/2012)  .  Pulmonary embolism (HCC) 05/31/2012    off coumadin due to fall/SDH; has filter in place  . Chronic kidney disease, stage 4 (severe)   . Exertional dyspnea   . Arthritis     "legs, fingers" (06/01/2012)  . Subdural hematoma (HCC)      Assessment: 89 y/oF from ALF who presents with [facility] and AMS. Patient noted to have nonproductive cough and congestion. Patient recently treated for UTI and currently being treated for bronchitis. To start Vancomycin and Zosyn for pneumonia.  12/9 >> Vancomycin >> 12/9 >> Zosyn >>    12/9 blood x 2: sent 12/9 urine: sent  Tmax: 101.24F WBC elevated at  25.4K AKI, SCr 2.64 with CrCl ~ 14 ml/min CG Lactic Acid: 4.24  Goal of Therapy:  Vancomycin trough level 15-20 mcg/ml  Appropriate antibiotic dosing for renal function and indication Eradication of infection  Plan:   Vancomycin 1g IV x 1 given in ED. Give additional  IV x 1 dose now to = total loading dose of . Continue with maintenance dose of Vancomycin 1g IV q48h.  Plan for Vancomycin trough level at steady state.  Zosyn 3.375g IV x 1 over 30 minutes given in ED. Continue with Zosyn 2.25g IV q6h.  Monitor renal function, cultures, clinical course.   Greer Pickerel, PharmD, BCPS Pager: [phone] 07/23/2015 1:14 PM

## 2015-07-17 NOTE — Progress Notes (Signed)
   Sep 13, 2014 1500  Clinical Encounter Type  Visited With Patient and family together  Visit Type Initial;Psychological support;Spiritual support;ED  Referral From Family  Consult/Referral To Chaplain  Spiritual Encounters  Spiritual Needs Prayer;Emotional;Other (Comment) (Pastoral Conversation)  Stress Factors  Patient Stress Factors Health changes;Other (Comment)  Family Stress Factors Health changes   The Chaplain encountered the patient and her family while rounding in the Emergency Department.  The patient was in the bed upon the Chaplain's arrival, but was awake and alert.  There was a daughter and 2 friends at the bedside. The patient was having a difficult time breathing which was audible and visible to the Chaplain.  The patient asked the Chaplain when she was going to be moved to a room, and was frustrated that she had been waiting so long.  The daughter and friends were not as frustrated as the patient, and tried reassuring her.  The daughter requested that the Chaplain pray for the patient and her family.  Chaplain interventions included family/friend support, emotional.psychological support for the patient, prayer and discussion with medical team member.  The patient's daughter requested water, which the Chaplain received permission by the Charge nurse to give.  Chaplain will follow-up once the patient has been placed in a bed.   Chaplain Clint BolderBrittany Kailany Dinunzio  M.Div.  Spiritual Care and [facility]

## 2015-07-17 NOTE — ED Notes (Signed)
Pt remains on monitor.  Specimen cup provided & pt/family informed need sputum sample.

## 2015-07-17 NOTE — H&P (Signed)
[facility] History and Physical  Patient: Nicole Harding  MRN: [id number]  DOB: Apr 21, 1924  DOS: the patient was seen and examined on 07/29/2015 PCP: Ronny Flurry, MD  Referring physician:  Dr.Schlossman,  Chief Complaint: Shortness of breath  HPI: Nicole Harding is a 81 y.o. female with Past medical history of pulmonary hypertension, COPD, essential hypertension, Parkinson's disease, history of pulmonary embolism not on any anticoagulation due to recurrent fall, status post IVC filter, chronic kidney disease stage IV. The patient is presenting with complaints of worsening of her chronic shortness of breath. The patient's chronic pulmonary hypertension as well as COPD because of which she has chronic shortness of breath. For last 2 days her symptoms have been progressively worsening. She was seen by her PCP and she was started on azithromycin as well as prednisone 20 mg as an outpatient. Despite this she continues to have further worsening of shortness of breath. She also had episodes of diarrhea 2, lose watery with foul-smelling yesterday. She had one episode of vomiting earlier in the morning as well. No episodes of aspiration reported by family. She is a resident at assisted living facility. She denies any complaints of abdominal pain nausea or vomiting at the time of my evaluation. She does complains of chest congestion and shortness of breath. She denies any dizziness or lightheadedness. At her baseline the patient is able to carry out a conversation and is able to identify the family member. She generally walks with a walker.  Review of Systems: as mentioned in the history of present illness.  A comprehensive review of the other systems is negative.  Past Medical History  Diagnosis Date  . Hypertension   . History of orthostatic hypotension   . IBS (irritable bowel syndrome)   . Parkinson's disease     ATYPICAL  . Dizziness   . Vertigo   . Fatigue   .  Advanced age   . HLD (hyperlipidemia)     intolerant to lipid lowering drugs  . Normal nuclear stress test 2003  . Fall at home 05/2012    mechanical fall  (06/01/2012)  . Pulmonary embolism (HCC) 05/31/2012    off coumadin due to fall/SDH; has filter in place  . Chronic kidney disease, stage 4 (severe)   . Exertional dyspnea   . Arthritis     "legs, fingers" (06/01/2012)  . Subdural hematoma [facility])    Past Surgical History  Procedure Laterality Date  . Small intestine surgery    . Hemorrhoid surgery  1970    "have4 had accidents ever since" (06/01/2012)  . Abdominal hysterectomy  1970's  . Tonsillectomy  1954  . Cholecystectomy  1955?  Marland Kitchen Breast cyst excision  1970's    left  . Cataract extraction w/ intraocular lens  implant, bilateral  ? 1990's  . Shoulder open rotator cuff repair  1990?    right  . Appendectomy    . Joint replacement    . Eye surgery     Social History:  reports that she quit smoking about 44 years ago. Her smoking use included Cigarettes. She has a 20 pack-year smoking history. She has never used smokeless tobacco. She reports that she does not drink alcohol or use illicit drugs.  Allergies  Allergen Reactions  . Amlodipine Nausea And Vomiting and Swelling    Swelling --sick and ended up in hospital N&V  . Codeine Nausea And Vomiting  . Lipitor [Atorvastatin Calcium] Other (See Comments)    "cramps my legs"  .  Lisinopril Other (See Comments)    unknown  . Prozac [Fluoxetine Hcl] Nausea And Vomiting    syncope  . Red Yeast Rice Other (See Comments)    unknown  . Zetia [Ezetimibe] Other (See Comments)    unknown  . Zoloft [Sertraline Hcl]     Extreme weakness    Family History  Problem Relation Age of Onset  . Stroke Father   . Emphysema Brother     smoked  . Heart disease      siblings    Prior to Admission medications   Medication Sig Start Date End Date Taking? Authorizing Provider  albuterol (PROVENTIL HFA;VENTOLIN HFA) 108 (90  BASE) MCG/ACT inhaler Inhale 2 puffs into the lungs every 4 (four) hours as needed for wheezing or shortness of breath.   Yes Historical Provider, MD  aspirin 81 MG tablet Take 81 mg by mouth daily.   Yes Historical Provider, MD  azithromycin (ZITHROMAX) 250 MG tablet Take 1 tablet by mouth daily. For 5 days 07/15/15 07/19/15 Yes Historical Provider, MD  citric acid-sodium citrate (ORACIT) 334-500 MG/5ML solution Take 30 mLs by mouth once. Patient taking differently: Take 30 mLs by mouth daily.  09/02/14  Yes Nyoka Cowden, MD  famotidine (PEPCID) 20 MG tablet Take 20 mg by mouth at bedtime.   Yes Historical Provider, MD  feeding supplement, ENSURE COMPLETE, (ENSURE COMPLETE) LIQD Take 237 mLs by mouth 2 (two) times daily between meals. 06/25/14  Yes Alysia Penna, MD  GuaiFENesin (MUCUS RELIEF ADULT PO) Take 1 tablet by mouth every 4 (four) hours as needed (congestion).   Yes Historical Provider, MD  hydrALAZINE (APRESOLINE) 25 MG tablet Take 25 mg by mouth 4 (four) times daily.   Yes Historical Provider, MD  isosorbide mononitrate (IMDUR) 60 MG 24 hr tablet Take 1 tablet (60 mg total) by mouth daily. 06/25/14  Yes Alysia Penna, MD  metoprolol succinate (TOPROL-XL) 25 MG 24 hr tablet Take 25 mg by mouth 2 (two) times daily. And  If blood pressure exceeds 150, take additional tablet   Yes Historical Provider, MD  Multiple Vitamins-Minerals (HAIR/SKIN/NAILS PO) Take by mouth.   Yes Historical Provider, MD  multivitamin-iron-minerals-folic acid (CENTRUM) chewable tablet Chew 1 tablet by mouth daily.   Yes Historical Provider, MD  pantoprazole (PROTONIX) 40 MG tablet TAKE ONE TABLET 30 TO 60 MINUTES BEFORE FIRST MEAL OF THE DAY 07/14/14  Yes Nyoka Cowden, MD  predniSONE (DELTASONE) 20 MG tablet Take 20 mg by mouth daily. 07/15/15 07/18/15 Yes Historical Provider, MD  cephALEXin (KEFLEX) 500 MG capsule Take 1 capsule (500 mg total) by mouth 4 (four) times daily. Patient not taking: Reported on  07/31/2015 07/04/15   Garlon Hatchet, PA-C    Physical Exam: Filed Vitals:   08/07/2015 1313 08/03/2015 1315 07/19/2015 1404 08/08/2015 1442  BP: 134/118 107/97 165/102 162/89  Pulse: 104 106 105 108  Temp:      TempSrc:      Resp: 23 33 25 23  Height:      Weight:      SpO2: 96% 93% 94% 95%    General: Alert, Awake and Oriented to Time, Place and Person. Appear in moderate distress Eyes: PERRL ENT: Oral Mucosa clear moist. Neck: no JVD Cardiovascular: S1 and S2 Present, aortic systolic Murmur, Peripheral Pulses Present Respiratory: Bilateral Air entry equal and Decreased,  Upper airway Crackles, bilateral expiratory wheezes Abdomen: Bowel Sound present, Soft and distended, mild diffuse tenderness Skin: no Rash Extremities: no Pedal edema,  no calf tenderness Neurologic: Grossly no focal neuro deficit. Other than generalized weakness  Labs on Admission:  CBC:  Recent Labs Lab 07/18/2015 1120  WBC 25.4*  NEUTROABS 23.3*  HGB 13.6  HCT 39.8  MCV 87.7  PLT 305    CMP     Component Value Date/Time   NA 126* 08/08/2015 1120   K 4.3 07/11/2015 1120   CL 97* 08/06/2015 1120   CO2 14* 07/28/2015 1120   GLUCOSE 166* 07/28/2015 1120   BUN 46* 08/06/2015 1120   CREATININE 2.64* 07/22/2015 1120   CALCIUM 8.7* 08/08/2015 1120   PROT 6.9 07/20/2015 1120   ALBUMIN 3.5 07/28/2015 1120   AST 91* 07/12/2015 1120   ALT 36 07/29/2015 1120   ALKPHOS 76 07/27/2015 1120   BILITOT 1.2 08/03/2015 1120   GFRNONAA 15* 08/04/2015 1120   GFRAA 17* 08/03/2015 1120    No results for input(s): CKTOTAL, CKMB, CKMBINDEX, TROPONINI in the last 168 hours. BNP (last 3 results) No results for input(s): BNP in the last 8760 hours.  ProBNP (last 3 results) No results for input(s): PROBNP in the last 8760 hours.   Radiological Exams on Admission: Dg Chest Port 1 View  07/10/2015  CLINICAL DATA:  Shortness of breath and fever.  Cough. EXAM: PORTABLE CHEST 1 VIEW COMPARISON:  September 30, 2014  FINDINGS: There is no apparent edema or consolidation. Calcified granulomas bilaterally are stable. Heart size normal. Prominence of the central pulmonary arteries with rapid peripheral tapering remains. No adenopathy apparent. No bone lesions. IMPRESSION: Findings suggesting pulmonary arterial hypertension. No edema or consolidation. Scattered granulomas. Electronically Signed   By: Bretta BangWilliam  Woodruff III M.D.   On: 07/28/2015 12:01   Dg Abd Portable 1v  07/16/2015  CLINICAL DATA:  Abdominal pain EXAM: PORTABLE ABDOMEN - 1 VIEW COMPARISON:  None. FINDINGS: Bowel gas pattern is unremarkable. No obstruction or free air is seen on this supine examination. Lung bases are clear. There is a filter in the inferior vena cava with the apex directed superiorly at the level of L2. There are small vascular calcifications in the pelvis. IMPRESSION: Inferior vena cava filter present. Bowel gas pattern unremarkable. No obstruction or free air evident. Electronically Signed   By: Bretta BangWilliam  Woodruff III M.D.   On: 07/20/2015 15:03   EKG: Independently reviewed. normal sinus rhythm, nonspecific ST and T waves changes.  Assessment/Plan 1. Sepsis [facility](HCC) The patient is presenting with complaints of respiratory distress as well as shortness of breath. Chest x-ray does not show any evidence of infection although the patient has bilateral expiratory wheezing as well as upper airway crepitation. With white count of 25, left shift with toxic ventilation, tachycardia, tachypnea, lactic acid of more than 4. The patient is consistent with sepsis. Most likely secondary to respiratory infection. At present the patient admitted with broad-spectrum antibiotics vancomycin and Zosyn. I would also give her Sinemet dose 60 mg every 6 hours, duo nebs, Mucinex. We'll use flutter device for secretion clearance. Patient will be admitted in stepdown unit at present due to her significant respiratory distress as well as increase oxygen  requirement. We'll give her gentle IV hydration as well.  2. Acute on chronic kidney injury, hyponatremia. Labs appear to be secondary to most likely sepsis. We will recheck the CMP. Gentle IV hydration.  3. Elevated troponin. EKG does not show any evidence of acute ischemia. Elevated troponin is most likely in the setting of chronic kidney disease. We will continue to monitor serial troponin. Should the  patient has elevated troponin we'll get echocardiogram. We will give her 81 mg aspirin since that is no contraindication at present.  4. GERD. Continuing Pepcid.  5. Hypomagnesemia. Replacing.  6. Abdominal distention with diarrhea. Check x-ray of the abdomen. Next, the patient does not have any diarrhea here, and only had 2 loose watery bowel movement, therefore I would hold off on further workup.  Nutrition: Clear liquid diet at present, advance as tolerated with aspiration precaution DVT Prophylaxis: subcutaneous Heparin  Advance goals of care discussion: DNR/DNI as per my discussion with patient's daughter as well as patient   Consults: none  Family Communication: family was present at bedside, opportunity was given to ask question and all questions were answered satisfactorily at the time of interview. Disposition: Admitted as inpatient, step-down unit.  Author: Lynden Oxford, MD [facility] Pager: [phone] 07/09/2015  If 7PM-7AM, please contact night-coverage [email or web address] Password [email or web address]

## 2015-07-17 NOTE — ED Notes (Signed)
Portable CXR at bedside.

## 2015-07-17 NOTE — ED Notes (Signed)
Dr Dalene SeltzerSchlossman notified of lactic acid of 4.24.

## 2015-07-17 NOTE — ED Notes (Signed)
Awake. Verbally responsive. A/O x2. Resp even and unlabored. Occ nonproductive cough noted. ABC's intact. SR on monitor. IV x2 saline lock patent and intact. Family at bedside.

## 2015-07-17 NOTE — Progress Notes (Signed)
Utilization Review completed.  Mustapha Colson RN CM  

## 2015-07-18 ENCOUNTER — Inpatient Hospital Stay (HOSPITAL_COMMUNITY): Payer: Medicare Other

## 2015-07-18 ENCOUNTER — Encounter (HOSPITAL_COMMUNITY): Payer: Self-pay | Admitting: Radiology

## 2015-07-18 DIAGNOSIS — R339 Retention of urine, unspecified: Secondary | ICD-10-CM

## 2015-07-18 DIAGNOSIS — R0600 Dyspnea, unspecified: Secondary | ICD-10-CM

## 2015-07-18 LAB — BASIC METABOLIC PANEL
ANION GAP: 12 (ref 5–15)
ANION GAP: 12 (ref 5–15)
BUN: 48 mg/dL — ABNORMAL HIGH (ref 6–20)
BUN: 49 mg/dL — ABNORMAL HIGH (ref 6–20)
CHLORIDE: 103 mmol/L (ref 101–111)
CHLORIDE: 102 mmol/L (ref 101–111)
CO2: 15 mmol/L — AB (ref 22–32)
CO2: 15 mmol/L — AB (ref 22–32)
CREATININE: 2.74 mg/dL — AB (ref 0.44–1.00)
CREATININE: 2.87 mg/dL — AB (ref 0.44–1.00)
Calcium: 8.3 mg/dL — ABNORMAL LOW (ref 8.9–10.3)
Calcium: 7.9 mg/dL — ABNORMAL LOW (ref 8.9–10.3)
GFR calc non Af Amer: 14 mL/min — ABNORMAL LOW (ref >60)
GFR calc non Af Amer: 14 mL/min — ABNORMAL LOW (ref >60)
GFR, EST AFRICAN AMERICAN: 17 mL/min — AB (ref >60)
GFR, EST AFRICAN AMERICAN: 16 mL/min — AB (ref >60)
Glucose, Bld: 121 mg/dL — ABNORMAL HIGH (ref 65–99)
Glucose, Bld: 157 mg/dL — ABNORMAL HIGH (ref 65–99)
POTASSIUM: 4.4 mmol/L (ref 3.5–5.1)
POTASSIUM: 4.2 mmol/L (ref 3.5–5.1)
SODIUM: 130 mmol/L — AB (ref 135–145)
SODIUM: 129 mmol/L — AB (ref 135–145)

## 2015-07-18 LAB — CBC WITH DIFFERENTIAL/PLATELET
BASOS ABS: 0 10*3/uL (ref 0.0–0.1)
BASOS PCT: 0 %
EOS ABS: 0 10*3/uL (ref 0.0–0.7)
Eosinophils Relative: 0 %
HCT: 33.6 % — ABNORMAL LOW (ref 36.0–46.0)
Hemoglobin: 11.2 g/dL — ABNORMAL LOW (ref 12.0–15.0)
Lymphocytes Relative: 2 %
Lymphs Abs: 0.4 10*3/uL — ABNORMAL LOW (ref 0.7–4.0)
MCH: 29.3 pg (ref 26.0–34.0)
MCHC: 33.3 g/dL (ref 30.0–36.0)
MCV: 88 fL (ref 78.0–100.0)
MONO ABS: 1.4 10*3/uL — AB (ref 0.1–1.0)
Monocytes Relative: 8 %
NEUTROS PCT: 90 %
Neutro Abs: 16.2 10*3/uL — ABNORMAL HIGH (ref 1.7–7.7)
PLATELETS: 227 10*3/uL (ref 150–400)
RBC: 3.82 MIL/uL — ABNORMAL LOW (ref 3.87–5.11)
RDW: 13.9 % (ref 11.5–15.5)
WBC Morphology: INCREASED
WBC: 18 10*3/uL — ABNORMAL HIGH (ref 4.0–10.5)

## 2015-07-18 LAB — URINE CULTURE

## 2015-07-18 LAB — CREATININE, URINE, RANDOM: CREATININE, URINE: 108.88 mg/dL

## 2015-07-18 LAB — SODIUM, URINE, RANDOM: SODIUM UR: 26 mmol/L

## 2015-07-18 LAB — OSMOLALITY, URINE: OSMOLALITY UR: 449 mosm/kg (ref 300–900)

## 2015-07-18 MED ORDER — HYDRALAZINE HCL 20 MG/ML IJ SOLN
10.0000 mg | INTRAMUSCULAR | Status: DC | PRN
  Administered 2015-07-19: 10 mg via INTRAVENOUS
  Filled 2015-07-18: qty 1

## 2015-07-18 MED ORDER — ACETAMINOPHEN 650 MG RE SUPP: 650.0000 mg | RECTAL | Status: DC | PRN

## 2015-07-18 MED ORDER — METOPROLOL TARTRATE 25 MG PO TABS: 25.0000 mg | ORAL_TABLET | Freq: Two times a day (BID) | ORAL | Status: DC

## 2015-07-18 MED ORDER — ASPIRIN EC 81 MG PO TBEC: 81.0000 mg | DELAYED_RELEASE_TABLET | Freq: Every day | ORAL | Status: DC

## 2015-07-18 MED ORDER — LORAZEPAM 2 MG/ML IJ SOLN
1.0000 mg | Freq: Once | INTRAMUSCULAR | Status: AC
  Administered 2015-07-18: 1 mg via INTRAVENOUS
  Filled 2015-07-18: qty 1

## 2015-07-18 MED ORDER — SODIUM CHLORIDE 0.9 % IV SOLN
INTRAVENOUS | Status: DC
  Administered 2015-07-18 – 2015-07-20 (×3): via INTRAVENOUS

## 2015-07-18 MED ORDER — ASPIRIN 300 MG RE SUPP
300.0000 mg | Freq: Every day | RECTAL | Status: DC
  Administered 2015-07-18: 300 mg via RECTAL
  Filled 2015-07-18: qty 1

## 2015-07-18 MED ORDER — RESOURCE THICKENUP CLEAR PO POWD
ORAL | Status: DC | PRN
  Administered 2015-07-18: 19:00:00 via ORAL
  Filled 2015-07-18: qty 125

## 2015-07-18 MED ORDER — METOPROLOL TARTRATE 1 MG/ML IV SOLN: 5.0000 mg | Freq: Four times a day (QID) | INTRAVENOUS | Status: DC

## 2015-07-18 MED ORDER — METOPROLOL TARTRATE 1 MG/ML IV SOLN: 2.5000 mg | Freq: Four times a day (QID) | INTRAVENOUS | Status: DC

## 2015-07-18 MED ORDER — ACETAMINOPHEN 325 MG PO TABS
650.0000 mg | ORAL_TABLET | Freq: Four times a day (QID) | ORAL | Status: DC | PRN
  Administered 2015-07-18 – 2015-07-20 (×3): 650 mg via ORAL
  Filled 2015-07-18 (×4): qty 2

## 2015-07-18 NOTE — Progress Notes (Signed)
[facility] Progress Note    Patient: Nicole BridgemanDorothy M Harding    ZOX:[id number]  DOB: August 27, 1923     DOA: 07/25/2015 Date of Service: the patient was seen and examined on 07/18/2015  Subjective: Patient is more lethargic as compared to yesterday. There was concern about medication swallowing last night. He isn't able to follow command at this morning. Nutrition: He was nothing by mouth last night Activity: Mostly bedridden Last BM: 07/23/15  Assessment and Plan: 1. Sepsis (HCC) Most likely secondary to pneumonia versus acute bronchitis. CBC this morning also shows increased bandemia. Continue with broad-spectrum antibiotics vancomycin and Zosyn. Monitor in stepdown unit. Continue gentle IV hydration. Continue Solu-Medrol and duo nebs and Mucinex as well.   2. Acute encephalopathy. The patient is more confused and lethargic this morning. Is not able to follow command. There was also concern about swallowing. We will check CT of the head to rule out any acute intracranial abnormality. Speech therapy will be consulted. Continue with aspirin. Most likely secondary to delirium secondary to sepsis.   3. Hyponatremia. Acute on chronic kidney injury. Sodium stabilizing. Continue IV hydration.  4. Elevated troponin. Repeat troponin elevated mildly. Echocardiogram shows normal ejection fraction without any wall motion abnormality. Continue aspirin.  5. Essential hypertension. Initially Lopressor was ordered but due to blood pressure being soft and it was discontinued. We will continue to monitor.  6. Chronic kidney injury with acute worsening. Urinary retention.  Patient has no urine output and her bladder scan was showing more than 300 mL of urine. Foley catheter inserted. Continue gentle IV hydration at present.   7. Diarrhea. No further episodes of diarrhea reported he had in the hospital. We will continue to closely monitor.  DVT Prophylaxis: subcutaneous  Heparin Nutrition: Dysphagia type I diet. No liquids Advance goals of care discussion: DNR/DNI  Brief Summary of Hospitalization:  HPI: As per the H and P dictated on admission, "Nicole Harding is a 81 y.o. female with Past medical history of pulmonary hypertension, COPD, essential hypertension, Parkinson's disease, history of pulmonary embolism not on any anticoagulation due to recurrent fall, status post IVC filter, chronic kidney disease stage IV. The patient is presenting with complaints of worsening of her chronic shortness of breath. The patient's chronic pulmonary hypertension as well as COPD because of which she has chronic shortness of breath. For last 2 days her symptoms have been progressively worsening. She was seen by her PCP and she was started on azithromycin as well as prednisone 20 mg as an outpatient. Despite this she continues to have further worsening of shortness of breath. She also had episodes of diarrhea 2, lose watery with foul-smelling yesterday. She had one episode of vomiting earlier in the morning as well. No episodes of aspiration reported by family. She is a resident at assisted living facility. She denies any complaints of abdominal pain nausea or vomiting at the time of my evaluation. She does complains of chest congestion and shortness of breath. She denies any dizziness or lightheadedness. At her baseline the patient is able to carry out a conversation and is able to identify the family member. She generally walks with a walker." Daily update: 07/18/2015, CT scan of the head and negative, speech therapy recommends dysphagia type I diet. Consultants: None Procedures: Echocardiogram, ejection fraction 60-65%, no wall motion abnormality, Antibiotics: Anti-infectives    Start     Dose/Rate Route Frequency Ordered Stop   07/19/15 1300  vancomycin (VANCOCIN) IVPB 1000 mg/200 mL premix  1,000 mg 200 mL/hr over 60 Minutes Intravenous Every 48 hours 08/08/2015  1310     07/13/2015 1800  piperacillin-tazobactam (ZOSYN) IVPB 2.25 g     2.25 g 100 mL/hr over 30 Minutes Intravenous 4 times per day 07/19/2015 1310     07/16/2015 1315  vancomycin (VANCOCIN) 500 mg in sodium chloride 0.9 % 100 mL IVPB     500 mg 100 mL/hr over 60 Minutes Intravenous STAT 07/21/2015 1310 07/27/2015 1420   07/12/2015 1115  vancomycin (VANCOCIN) IVPB 1000 mg/200 mL premix     1,000 mg 200 mL/hr over 60 Minutes Intravenous  Once 08/08/2015 1107 07/23/2015 1243   07/23/2015 1115  piperacillin-tazobactam (ZOSYN) IVPB 3.375 g     3.375 g 100 mL/hr over 30 Minutes Intravenous STAT 07/10/2015 1112 07/25/2015 1157       Family Communication: family was present at bedside, at the time of interview.  Opportunity was given to ask question and all questions were answered satisfactorily.   Disposition:  Expected discharge date: 07/21/2015 Barriers to safe discharge: Improvement in her condition   Intake/Output Summary (Last 24 hours) at 07/18/15 1859 Last data filed at 07/18/15 1805  Gross per 24 hour  Intake 878.75 ml  Output    420 ml  Net 458.75 ml   Filed Weights   07/20/2015 1116 07/28/2015 1835  Weight: 64.864 kg (143 lb) 67.4 kg (148 lb 9.4 oz)    Objective: Physical Exam: Filed Vitals:   07/18/15 1200 07/18/15 1300 07/18/15 1500 07/18/15 1600  BP: 115/38 110/46 96/54 114/70  Pulse: 104 103 102 117  Temp: 97.4 F (36.3 C)   98.4 F (36.9 C)  TempSrc: Oral   Oral  Resp: 21 19 32 32  Height:      Weight:      SpO2: 96% 93% 92% 94%     General: Appear in moderate distress, no Rash; Oral Mucosa dry. Cardiovascular: S1 and S2 Present, no Murmur,  Respiratory: Bilateral Air entry present and bilateral Crackles, bilateral expiratory wheezes Abdomen: Bowel Sound present, Soft and no tenderness Extremities: no Pedal edema, no calf tenderness Neurology: More lethargic, unable to follow command  Data Reviewed: CBC:  Recent Labs Lab 07/18/2015 1120 07/18/15 0530  WBC 25.4* 18.0*   NEUTROABS 23.3* 16.2*  HGB 13.6 11.2*  HCT 39.8 33.6*  MCV 87.7 88.0  PLT 305 227   Basic Metabolic Panel:  Recent Labs Lab 07/20/2015 1120 07/30/2015 1448 07/18/2015 2055 07/18/15 0135 07/18/15 0530  NA 126* 128* 129* 130* 129*  K 4.3 3.8 4.4 4.4 4.2  CL 97* 101 103 103 102  CO2 14* 14* 14* 15* 15*  GLUCOSE 166* 127* 110* 121* 157*  BUN 46* 43* 46* 48* 49*  CREATININE 2.64* 2.40* 2.56* 2.74* 2.87*  CALCIUM 8.7* 7.9* 8.4* 8.3* 7.9*  MG 1.6*  --   --   --   --    Liver Function Tests:  Recent Labs Lab 07/31/2015 1120 07/25/2015 1448  AST 91* 81*  ALT 36 34  ALKPHOS 76 67  BILITOT 1.2 0.9  PROT 6.9 6.0*  ALBUMIN 3.5 3.1*   No results for input(s): LIPASE, AMYLASE in the last 168 hours. No results for input(s): AMMONIA in the last 168 hours.  Cardiac Enzymes:  Recent Labs Lab 07/11/2015 1447 08/02/2015 2055  TROPONINI 0.73* 1.07*   BNP (last 3 results) No results for input(s): BNP in the last 8760 hours.  ProBNP (last 3 results) No results for input(s): PROBNP in the last  8760 hours.   CBG: No results for input(s): GLUCAP in the last 168 hours.  Recent Results (from the past 240 hour(s))  Blood Culture (routine x 2)     Status: None (Preliminary result)   Collection Time: Jul 25, 2015 11:10 AM  Result Value Ref Range Status   Specimen Description BLOOD LEFT ANTECUBITAL  Final   Special Requests BOTTLES DRAWN AEROBIC AND ANAEROBIC  Final   Culture   Final    NO GROWTH 1 DAY Performed at [facility]    Report Status PENDING  Incomplete  Blood Culture (routine x 2)     Status: None (Preliminary result)   Collection Time: 2015-07-25 11:40 AM  Result Value Ref Range Status   Specimen Description BLOOD RIGHT WRIST  Final   Special Requests IN PEDIATRIC BOTTLE  Final   Culture   Final    NO GROWTH 1 DAY Performed at [facility]    Report Status PENDING  Incomplete  Urine culture     Status: None   Collection Time: 25-Jul-2015 12:32 PM  Result  Value Ref Range Status   Specimen Description URINE, CATHETERIZED  Final   Special Requests NONE  Final   Culture   Final    2,000 COLONIES/mL INSIGNIFICANT GROWTH Performed at [facility]    Report Status 07/18/2015 FINAL  Final  MRSA PCR Screening     Status: None   Collection Time: 07-25-15  7:02 PM  Result Value Ref Range Status   MRSA by PCR NEGATIVE NEGATIVE Final    Comment:        The GeneXpert MRSA Assay (FDA approved for NASAL specimens only), is one component of a comprehensive MRSA colonization surveillance program. It is not intended to diagnose MRSA infection nor to guide or monitor treatment for MRSA infections.      Studies: Ct Head Wo Contrast  07/18/2015  CLINICAL DATA:  81 year old who fell 3 days ago, presenting with acute mental status changes as evidenced by severe confusion. Patient also has vertigo and dizziness. Possible acute encephalopathy. Initial encounter. EXAM: CT HEAD WITHOUT CONTRAST TECHNIQUE: Contiguous axial images were obtained from the base of the skull through the vertex without intravenous contrast. COMPARISON:  CT head 06/10/2015 and earlier.  MRI brain 10/06/2009. FINDINGS: Patient motion degraded the images were true were repeated a total of 4 times. The final imaging sequence has less motion and is diagnostic. Severe cortical and deep atrophy, unchanged dating back to the 2011 MRI. Severe changes of small vessel disease of the white matter diffusely, including the brainstem and pons, also unchanged. Old lacunar type strokes in the basal ganglia bilaterally, right greater than left, unchanged. No mass lesion. No midline shift. No acute hemorrhage or hematoma. No extra-axial fluid collections. No evidence of acute infarction. No skull fracture or other focal osseous abnormality involving the skull. Air-fluid level in the maxillary sinuses, left greater than right, opacification of multiple bilateral ethmoid air cells, mucosal thickening  and air- fluid level in the left sphenoid sinus. Bilateral mastoid air cells and middle ear cavities well-aerated. Extensive bilateral carotid siphon atherosclerosis. IMPRESSION: 1. Motion degraded examination demonstrates no acute intracranial abnormality. 2. Severe generalized atrophy and severe chronic microvascular ischemic changes of the white matter, including the brainstem and pons, stable since 2011. 3. Acute bilateral maxillary, bilateral ethmoid and left sphenoid sinus disease, likely superimposed upon chronic sinusitis. Electronically Signed   By: Hulan Saas M.D.   On: 07/18/2015 09:58   Dg  Chest Port 1v Same Day  07/12/2015  CLINICAL DATA:  Dyspnea and respiratory abnormalities, worsened today around 5 p.m. EXAM: PORTABLE CHEST 1 VIEW COMPARISON:  Radiograph earlier this day. FINDINGS: Development of patchy opacity at the right lung base. Left lung is clear. Heart is normal in size, prominence of both pulmonary hila again seen. No pleural effusion or pneumothorax. Calcified granuloma bilaterally are unchanged. IMPRESSION: Development of patchy opacity at the right lung base from earlier this day; aspiration, atelectasis, or developing pneumonia considered. Electronically Signed   By: Rubye Oaks M.D.   On: 08/02/2015 21:55     Scheduled Meds: . [START ON 07/19/2015] aspirin EC  81 mg Oral Daily  . famotidine (PEPCID) IV  20 mg Intravenous Q24H  . guaiFENesin  600 mg Oral BID  . heparin  5,000 Units Subcutaneous 3 times per day  . ipratropium-albuterol  3 mL Nebulization Q6H  . methylPREDNISolone (SOLU-MEDROL) injection  60 mg Intravenous 4 times per day  . piperacillin-tazobactam (ZOSYN)  IV  2.25 g Intravenous 4 times per day  . [START ON 07/19/2015] vancomycin  1,000 mg Intravenous Q48H   Continuous Infusions: . sodium chloride 75 mL/hr at 07/18/15 1633   PRN Meds: acetaminophen, hydrALAZINE, ondansetron (ZOFRAN) IV, RESOURCE THICKENUP CLEAR  Time spent: 30  minutes  Author: Lynden Oxford, MD [facility] Pager: [phone] 07/18/2015 6:59 PM  If 7PM-7AM, please contact night-coverage at [email or web address], password [facility]

## 2015-07-18 NOTE — Progress Notes (Signed)
Patient has had no urinary output documented since 11 pm on 08/04/2015.   Patient in no apparent distress Bladder scan performed show 316 ml in bladder will continue to monitor and notify MD as needed.

## 2015-07-18 NOTE — Evaluation (Signed)
Clinical/Bedside Swallow Evaluation Patient Details  Name: Nicole Harding MRN: [id number] Date of Birth: 1924-04-19  Today's Date: 07/18/2015 Time: SLP Start Time (ACUTE ONLY): 1003 SLP Stop Time (ACUTE ONLY): 1021 SLP Time Calculation (min) (ACUTE ONLY): 18 min  Past Medical History:  Past Medical History  Diagnosis Date  . Hypertension   . History of orthostatic hypotension   . IBS (irritable bowel syndrome)   . Parkinson's disease     ATYPICAL  . Dizziness   . Vertigo   . Fatigue   . Advanced age   . HLD (hyperlipidemia)     intolerant to lipid lowering drugs  . Normal nuclear stress test 2003  . Fall at home 05/2012    mechanical fall  (06/01/2012)  . Pulmonary embolism (HCC) 05/31/2012    off coumadin due to fall/SDH; has filter in place  . Chronic kidney disease, stage 4 (severe)   . Exertional dyspnea   . Arthritis     "legs, fingers" (06/01/2012)  . Subdural hematoma [facility])    Past Surgical History:  Past Surgical History  Procedure Laterality Date  . Small intestine surgery    . Hemorrhoid surgery  1970    "have4 had accidents ever since" (06/01/2012)  . Abdominal hysterectomy  1970's  . Tonsillectomy  1954  . Cholecystectomy  1955?  Marland Kitchen Breast cyst excision  1970's    left  . Cataract extraction w/ intraocular lens  implant, bilateral  ? 1990's  . Shoulder open rotator cuff repair  1990?    right  . Appendectomy    . Joint replacement    . Eye surgery     HPI:  Nicole Harding is a 81 y.o. female with Past medical history of pulmonary hypertension, COPD, essential hypertension, Parkinson's disease, history of pulmonary embolism not on any anticoagulation due to recurrent fall, status post IVC filter, chronic kidney disease stage IV. The patient is presenting with complaints of worsening of her chronic shortness of breath. CXR concerning for aspiration, atelectasis, or developing PNA. Previous BSE completed 06/19/12 recommending refular diet and  thin liquids.   Assessment / Plan / Recommendation Clinical Impression  Pt appears to have difficulty coordinating the apneic period necessary for safe swallowing function. Breathing becomes rapid during PO intake, with difficulty getting liquids even into her mouth. immediate inhalation is noted following all swallows, but wet quality and coughing are noted while consuming thin liquids. Suspect that any liquids would pose a high aspiration risk at this time. Would provide pt with pureed solids on a conservative basis - discussed with family and RN. Would cease PO intake if pt shows increase in coughing, dyspnea, and/or congestion or wet vocal quality. SLP to follow for tolerance. Hopeful for diet upgrade as respiratory status improves.    Aspiration Risk  Moderate aspiration risk    Diet Recommendation  Dys 1 diet, pudding thick liquids (no liquids)   Medication Administration: Crushed with puree    Other  Recommendations Oral Care Recommendations: Oral care BID Other Recommendations: Order thickener from pharmacy;Prohibited food (jello, ice cream, thin soups);Remove water pitcher   Follow up Recommendations  Skilled Nursing facility    Frequency and Duration min 2x/week  2 weeks       Prognosis Prognosis for Safe Diet Advancement: Good Barriers to Reach Goals: Other (Comment) (respiratory status)      Swallow Study   General Date of Onset: 07/27/2015 HPI: Nicole Harding is a 81 y.o. female with Past medical history of  pulmonary hypertension, COPD, essential hypertension, Parkinson's disease, history of pulmonary embolism not on any anticoagulation due to recurrent fall, status post IVC filter, chronic kidney disease stage IV. The patient is presenting with complaints of worsening of her chronic shortness of breath. CXR concerning for aspiration, atelectasis, or developing PNA. Previous BSE completed 06/19/12 recommending refular diet and thin liquids. Type of Study: Bedside  Swallow Evaluation Previous Swallow Assessment: see HPI Diet Prior to this Study: Thin liquids (full liquids) Temperature Spikes Noted: Yes (101.7) Respiratory Status: Nasal cannula History of Recent Intubation: No Behavior/Cognition: Alert;Cooperative;Pleasant mood;Requires cueing Oral Cavity Assessment: Within Functional Limits Oral Care Completed by SLP: No Oral Cavity - Dentition: Adequate natural dentition Vision: Functional for self-feeding Self-Feeding Abilities: Able to feed self;Needs assist Patient Positioning: Upright in bed Baseline Vocal Quality: Low vocal intensity Volitional Cough: Congested Volitional Swallow: Able to elicit    Oral/Motor/Sensory Function     Ice Chips Ice chips: Impaired Presentation: Spoon Pharyngeal Phase Impairments: Suspected delayed Swallow   Thin Liquid Thin Liquid: Impaired Presentation: Cup;Self Fed;Spoon Pharyngeal  Phase Impairments: Suspected delayed Swallow;Wet Vocal Quality;Cough - Immediate;Cough - Delayed;Change in Vital Signs    Nectar Thick Nectar Thick Liquid: Not tested   Honey Thick Honey Thick Liquid: Not tested   Puree Puree: Impaired Presentation: Spoon Pharyngeal Phase Impairments: Suspected delayed Swallow   Solid Solid: Not tested      Maxcine HamLaura Paiewonsky, M.A. CCC-SLP [phone]  Maxcine Hamaiewonsky, Shahiem Bedwell 07/18/2015,10:42 AM

## 2015-07-19 ENCOUNTER — Inpatient Hospital Stay (HOSPITAL_COMMUNITY): Payer: Medicare Other

## 2015-07-19 DIAGNOSIS — I480 Paroxysmal atrial fibrillation: Secondary | ICD-10-CM | POA: Diagnosis present

## 2015-07-19 LAB — CBC WITH DIFFERENTIAL/PLATELET
BASOS PCT: 0 %
Basophils Absolute: 0 10*3/uL (ref 0.0–0.1)
EOS PCT: 0 %
Eosinophils Absolute: 0 10*3/uL (ref 0.0–0.7)
HCT: 33.1 % — ABNORMAL LOW (ref 36.0–46.0)
HEMOGLOBIN: 11.3 g/dL — AB (ref 12.0–15.0)
LYMPHS PCT: 2 %
Lymphs Abs: 0.3 10*3/uL — ABNORMAL LOW (ref 0.7–4.0)
MCH: 29.6 pg (ref 26.0–34.0)
MCHC: 34.1 g/dL (ref 30.0–36.0)
MCV: 86.6 fL (ref 78.0–100.0)
MONOS PCT: 8 %
Monocytes Absolute: 1.4 10*3/uL — ABNORMAL HIGH (ref 0.1–1.0)
NEUTROS ABS: 15.2 10*3/uL — AB (ref 1.7–7.7)
Neutrophils Relative %: 90 %
Platelets: 255 10*3/uL (ref 150–400)
RBC: 3.82 MIL/uL — ABNORMAL LOW (ref 3.87–5.11)
RDW: 14.2 % (ref 11.5–15.5)
WBC MORPHOLOGY: INCREASED
WBC: 16.9 10*3/uL — ABNORMAL HIGH (ref 4.0–10.5)

## 2015-07-19 LAB — COMPREHENSIVE METABOLIC PANEL
ALBUMIN: 2.3 g/dL — AB (ref 3.5–5.0)
ALK PHOS: 58 U/L (ref 38–126)
ALT: 32 U/L (ref 14–54)
ANION GAP: 10 (ref 5–15)
AST: 38 U/L (ref 15–41)
BUN: 66 mg/dL — ABNORMAL HIGH (ref 6–20)
CHLORIDE: 107 mmol/L (ref 101–111)
CO2: 15 mmol/L — AB (ref 22–32)
Calcium: 7.8 mg/dL — ABNORMAL LOW (ref 8.9–10.3)
Creatinine, Ser: 3.44 mg/dL — ABNORMAL HIGH (ref 0.44–1.00)
GFR calc Af Amer: 13 mL/min — ABNORMAL LOW (ref >60)
GFR calc non Af Amer: 11 mL/min — ABNORMAL LOW (ref >60)
GLUCOSE: 140 mg/dL — AB (ref 65–99)
POTASSIUM: 3.9 mmol/L (ref 3.5–5.1)
SODIUM: 132 mmol/L — AB (ref 135–145)
Total Bilirubin: 0.8 mg/dL (ref 0.3–1.2)
Total Protein: 5.3 g/dL — ABNORMAL LOW (ref 6.5–8.1)

## 2015-07-19 LAB — TROPONIN I: TROPONIN I: 0.15 ng/mL — AB (ref <0.031)

## 2015-07-19 LAB — PROTIME-INR
INR: 1.39 (ref 0.00–1.49)
Prothrombin Time: 17.1 seconds — ABNORMAL HIGH (ref 11.6–15.2)

## 2015-07-19 MED ORDER — GUAIFENESIN 100 MG/5ML PO SOLN
15.0000 mL | Freq: Four times a day (QID) | ORAL | Status: DC
  Administered 2015-07-19: 300 mg via ORAL
  Filled 2015-07-19: qty 20

## 2015-07-19 MED ORDER — ASPIRIN 81 MG PO CHEW
81.0000 mg | CHEWABLE_TABLET | Freq: Every day | ORAL | Status: DC
  Administered 2015-07-19 – 2015-07-21 (×3): 81 mg via ORAL
  Filled 2015-07-19 (×4): qty 1

## 2015-07-19 MED ORDER — METHYLPREDNISOLONE SODIUM SUCC 125 MG IJ SOLR: 60.0000 mg | Freq: Two times a day (BID) | INTRAMUSCULAR | Status: DC

## 2015-07-19 MED ORDER — VITAMINS A & D EX OINT
TOPICAL_OINTMENT | CUTANEOUS | Status: AC
Start: 2015-07-19 — End: 2015-07-19
  Administered 2015-07-19: 1
  Filled 2015-07-19: qty 5

## 2015-07-19 MED ORDER — PIPERACILLIN-TAZOBACTAM IN DEX 2-0.25 GM/50ML IV SOLN
2.2500 g | Freq: Three times a day (TID) | INTRAVENOUS | Status: DC
  Administered 2015-07-19 – 2015-07-22 (×8): 2.25 g via INTRAVENOUS
  Filled 2015-07-19 (×11): qty 50

## 2015-07-19 MED ORDER — AMIODARONE HCL IN DEXTROSE 360-4.14 MG/200ML-% IV SOLN
60.0000 mg/h | INTRAVENOUS | Status: DC
  Administered 2015-07-19 – 2015-07-20 (×6): 60 mg/h via INTRAVENOUS
  Filled 2015-07-19 (×7): qty 200

## 2015-07-19 MED ORDER — VANCOMYCIN HCL IN DEXTROSE 1-5 GM/200ML-% IV SOLN
1000.0000 mg | INTRAVENOUS | Status: DC
  Administered 2015-07-19: 1000 mg via INTRAVENOUS
  Filled 2015-07-19: qty 200

## 2015-07-19 MED ORDER — SODIUM CHLORIDE 0.9 % IV BOLUS (SEPSIS)
250.0000 mL | Freq: Once | INTRAVENOUS | Status: AC
  Administered 2015-07-19: 250 mL via INTRAVENOUS

## 2015-07-19 MED ORDER — FENTANYL CITRATE (PF) 100 MCG/2ML IJ SOLN: 25.0000 ug | INTRAMUSCULAR | Status: DC | PRN

## 2015-07-19 MED ORDER — DILTIAZEM HCL 100 MG IV SOLR
5.0000 mg/h | INTRAVENOUS | Status: DC
  Administered 2015-07-19: 5 mg/h via INTRAVENOUS
  Filled 2015-07-19: qty 100

## 2015-07-19 NOTE — Progress Notes (Signed)
[facility] Progress Note    Patient: Nicole BridgemanDorothy M Harding   ION:[id number]:[id number]  DOB: [birth year]     DOA: 07/24/2015 Date of Service: the patient was seen and examined on 07/19/2015  Subjective: Last night patient was in A. fib with RVR, cannot tolerate Cardizem infusion and was started on amiodarone drip. Patient just morning is more awake and is able to follow command. Speech is still in coherent most likely due to dryness of the mouth as she appears to be a mouth breather. Nutrition: Dysphagia type I diet Activity: Mostly bedridden. Last BM: Dec 05, 2014  Assessment and Plan: 1. Sepsis (HCC) Most likely pneumonia versus acute bronchitis. CBC continues to show bandemia. Urine culture insignificant growth, blood culture negative for 24 hours, no sputum culture as of yet. Continuing vancomycin and Zosyn. Continue monitoring step down unit. Renal function has worsened but will continue with IV hydration. WBCs are getting better oxygen requirement is also getting better. We will reduce Solu-Medrol to every 12 hours. Continue duo nebs and Mucinex.  2. Acute on chronic kidney disease. Renal function has worsened today. Patient had urinary retention yesterday. Foley catheter was inserted. Most likely in the setting of sepsis causing ATN. We will get ultrasound renal to rule out any other abnormality.  3. Hyponatremia. Getting better. Continuing gentle IV hydration.  4. Acute encephalopathy. Getting better. Most likely delirium secondary to sepsis. Continue aspirin. Continue speech therapy.  5. Atrial fibrillation with RVR. CHA2DS2-VASc Score 7 On amiodarone drip. Currently sinus rhythm. Paroxysmal in the setting of respiratory failure. Continuing aspirin. Patient not on any anticoagulation as she has a history of subdural hematoma due to multiple fall.  DVT Prophylaxis: subcutaneous Heparin Nutrition:  Advance goals of care discussion: DNR/DNI  Brief Summary of Hospitalization:   HPI: As per the H and P dictated on admission, "Nicole BridgemanDorothy M Simoneaux is a 81 y.o. female with Past medical history of pulmonary hypertension, COPD, essential hypertension, Parkinson's disease, history of pulmonary embolism not on any anticoagulation due to recurrent fall, status post IVC filter, chronic kidney disease stage IV. The patient is presenting with complaints of worsening of her chronic shortness of breath. The patient's chronic pulmonary hypertension as well as COPD because of which she has chronic shortness of breath. For last 2 days her symptoms have been progressively worsening. She was seen by her PCP and she was started on azithromycin as well as prednisone 20 mg as an outpatient. Despite this she continues to have further worsening of shortness of breath. She also had episodes of diarrhea 2, lose watery with foul-smelling yesterday. She had one episode of vomiting earlier in the morning as well. No episodes of aspiration reported by family. She is a resident at assisted living facility. She denies any complaints of abdominal pain nausea or vomiting at the time of my evaluation. She does complains of chest congestion and shortness of breath. She denies any dizziness or lightheadedness. At her baseline the patient is able to carry out a conversation and is able to identify the family member. She generally walks with a walker." Daily update: 07/18/2015, CT scan of the head and negative, speech therapy recommends dysphagia type I diet. Consultants: None Procedures: Echocardiogram, ejection fraction 60-65%, no wall motion abnormality,  Antibiotics: Anti-infectives    Start     Dose/Rate Route Frequency Ordered Stop   07/19/15 1300  vancomycin (VANCOCIN) IVPB 1000 mg/200 mL premix     1,000 mg 200 mL/hr over 60 Minutes Intravenous Every 48 hours 2014-09-19 1310  08/06/2015 1800  piperacillin-tazobactam (ZOSYN) IVPB 2.25 g     2.25 g 100 mL/hr over 30 Minutes Intravenous 4 times per  day 07/11/2015 1310     07/20/2015 1315  vancomycin (VANCOCIN) 500 mg in sodium chloride 0.9 % 100 mL IVPB     500 mg 100 mL/hr over 60 Minutes Intravenous STAT 08/02/2015 1310 07/19/2015 1420   07/19/2015 1115  vancomycin (VANCOCIN) IVPB 1000 mg/200 mL premix     1,000 mg 200 mL/hr over 60 Minutes Intravenous  Once 08/07/2015 1107 07/25/2015 1243   08/08/2015 1115  piperacillin-tazobactam (ZOSYN) IVPB 3.375 g     3.375 g 100 mL/hr over 30 Minutes Intravenous STAT 07/30/2015 1112 08/01/2015 1157     Family Communication: no family was present at bedside, at the time of interview.   Disposition:  Barriers to safe discharge: Improvement in her sepsis   Intake/Output Summary (Last 24 hours) at 07/19/15 1041 Last data filed at 07/19/15 0800  Gross per 24 hour  Intake 2076.92 ml  Output    575 ml  Net 1501.92 ml   Filed Weights   07/18/2015 1116 07/19/2015 1835 07/19/15 0557  Weight: 64.864 kg (143 lb) 67.4 kg (148 lb 9.4 oz) 71.1 kg (156 lb 12 oz)    Objective: Physical Exam: Filed Vitals:   07/19/15 0758 07/19/15 0800 07/19/15 0814 07/19/15 0815  BP:  124/42  137/58  Pulse:  73  82  Temp:  97.3 F (36.3 C)    TempSrc:  Axillary    Resp:  18  24  Height:      Weight:      SpO2: 98% 97% 98% 98%     General: Appear in mild distress, no Rash; Oral Mucosa dry. Cardiovascular: S1 and S2 Present, no Murmur,  Respiratory: Bilateral Air entry present and bilateral Crackles, bilateral expiratory wheezes Abdomen: Bowel Sound present, Soft and no tenderness Extremities: no Pedal edema, no calf tenderness Neurology: Speech remains incoherent, improvement in mentation and lethargic  Data Reviewed: CBC:  Recent Labs Lab 07/19/2015 1120 07/18/15 0530 07/19/15 0340  WBC 25.4* 18.0* 16.9*  NEUTROABS 23.3* 16.2* 15.2*  HGB 13.6 11.2* 11.3*  HCT 39.8 33.6* 33.1*  MCV 87.7 88.0 86.6  PLT 305 227 255   Basic Metabolic Panel:  Recent Labs Lab 07/28/2015 1120 07/16/2015 1448 07/10/2015 2055  07/18/15 0135 07/18/15 0530 07/19/15 0340  NA 126* 128* 129* 130* 129* 132*  K 4.3 3.8 4.4 4.4 4.2 3.9  CL 97* 101 103 103 102 107  CO2 14* 14* 14* 15* 15* 15*  GLUCOSE 166* 127* 110* 121* 157* 140*  BUN 46* 43* 46* 48* 49* 66*  CREATININE 2.64* 2.40* 2.56* 2.74* 2.87* 3.44*  CALCIUM 8.7* 7.9* 8.4* 8.3* 7.9* 7.8*  MG 1.6*  --   --   --   --   --    Liver Function Tests:  Recent Labs Lab 07/22/2015 1120 07/09/2015 1448 07/19/15 0340  AST 91* 81* 38  ALT 36 34 32  ALKPHOS 76 67 58  BILITOT 1.2 0.9 0.8  PROT 6.9 6.0* 5.3*  ALBUMIN 3.5 3.1* 2.3*   No results for input(s): LIPASE, AMYLASE in the last 168 hours. No results for input(s): AMMONIA in the last 168 hours.  Cardiac Enzymes:  Recent Labs Lab 07/11/2015 1447 07/24/2015 2055  TROPONINI 0.73* 1.07*   BNP (last 3 results) No results for input(s): BNP in the last 8760 hours.  ProBNP (last 3 results) No results for input(s): PROBNP in the  last 8760 hours.   CBG: No results for input(s): GLUCAP in the last 168 hours.  Recent Results (from the past 240 hour(s))  Blood Culture (routine x 2)     Status: None (Preliminary result)   Collection Time: 2015-08-09 11:10 AM  Result Value Ref Range Status   Specimen Description BLOOD LEFT ANTECUBITAL  Final   Special Requests BOTTLES DRAWN AEROBIC AND ANAEROBIC  Final   Culture   Final    NO GROWTH 1 DAY Performed at [facility]    Report Status PENDING  Incomplete  Blood Culture (routine x 2)     Status: None (Preliminary result)   Collection Time: 08-09-15 11:40 AM  Result Value Ref Range Status   Specimen Description BLOOD RIGHT WRIST  Final   Special Requests IN PEDIATRIC BOTTLE  Final   Culture   Final    NO GROWTH 1 DAY Performed at [facility]    Report Status PENDING  Incomplete  Urine culture     Status: None   Collection Time: 08/09/15 12:32 PM  Result Value Ref Range Status   Specimen Description URINE, CATHETERIZED  Final   Special  Requests NONE  Final   Culture   Final    2,000 COLONIES/mL INSIGNIFICANT GROWTH Performed at [facility]    Report Status 07/18/2015 FINAL  Final  MRSA PCR Screening     Status: None   Collection Time: 08/09/2015  7:02 PM  Result Value Ref Range Status   MRSA by PCR NEGATIVE NEGATIVE Final    Comment:        The GeneXpert MRSA Assay (FDA approved for NASAL specimens only), is one component of a comprehensive MRSA colonization surveillance program. It is not intended to diagnose MRSA infection nor to guide or monitor treatment for MRSA infections.      Studies: No results found.   Scheduled Meds: . aspirin EC  81 mg Oral Daily  . famotidine (PEPCID) IV  20 mg Intravenous Q24H  . guaiFENesin  600 mg Oral BID  . heparin  5,000 Units Subcutaneous 3 times per day  . ipratropium-albuterol  3 mL Nebulization Q6H  . methylPREDNISolone (SOLU-MEDROL) injection  60 mg Intravenous 4 times per day  . piperacillin-tazobactam (ZOSYN)  IV  2.25 g Intravenous 4 times per day  . vancomycin  1,000 mg Intravenous Q48H   Continuous Infusions: . sodium chloride 75 mL/hr at 07/19/15 0800  . amiodarone 60 mg/hr (07/19/15 0800)   PRN Meds: acetaminophen, hydrALAZINE, ondansetron (ZOFRAN) IV, RESOURCE THICKENUP CLEAR  Time spent: 35 minutes  Author: Lynden Oxford, MD [facility] Pager: [phone] 07/19/2015 10:41 AM  If 7PM-7AM, please contact night-coverage at [email or web address], password [facility]

## 2015-07-19 NOTE — Progress Notes (Signed)
Pt HR in the 140's, BP 119/41, MAP 68. Paged [facility]. Started on Cardizem drip at 5mg /hr. Will continue to monitor. Leonia CoronaMelinda R Vaida Kerchner, RN 07/19/2015 1:42 AM

## 2015-07-19 NOTE — Progress Notes (Signed)
Patient was placed on Cardizem drip at 1:30a for HR in the 140's, BP was 129/56, MAP 68, respirations also increased. Started Cardizem at 5mg  and 30min later HR remained in the 130's-140's and BP dropped to 80/56 (64). Cardizem drip was stopped and [facility] was notified. MD ordered two-22150ml NS bolus and Amiodarone drip at 3860ml/hr. Patient's HR is now in the 80's-90's and SBP 100's and DBP 70's. Respiration rate improved. Will continue to monitor. Leonia CoronaMelinda R Shem Plemmons, RN 07/19/2015 6:55 AM.

## 2015-07-19 NOTE — Progress Notes (Signed)
ANTIBIOTIC CONSULT NOTE - INITIAL  Pharmacy Consult for Vancomycin, Zosyn Indication: Pneumonia  Allergies  Allergen Reactions  . Amlodipine Nausea And Vomiting and Swelling    Swelling --sick and ended up in hospital N&V  . Codeine Nausea And Vomiting  . Lipitor [Atorvastatin Calcium] Other (See Comments)    "cramps my legs"  . Lisinopril Other (See Comments)    unknown  . Prozac [Fluoxetine Hcl] Nausea And Vomiting    syncope  . Red Yeast Rice Other (See Comments)    unknown  . Zetia [Ezetimibe] Other (See Comments)    unknown  . Zoloft [Sertraline Hcl]     Extreme weakness    Patient Measurements: Height:  (170.2 cm) Weight: 156 lb 12 oz (71.1 kg) IBW/kg (Calculated) : 61.6  Vital Signs: Temp: 97.5 F (36.4 C) (12/11 1200) Temp Source: Oral (12/11 1200) BP: 132/61 mmHg (12/11 1200) Pulse Rate: 88 (12/11 1215) Intake/Output from previous day: 12/10 0701 - 12/11 0700 In: 1758.8 [I.V.:1008.8; IV Piggyback:750] Out: 575 [Urine:575] Intake/Output from this shift: Total I/O In: 801.4 [I.V.:751.4; IV Piggyback:50] Out: -   Labs:  Recent Labs  07/27/15 1120  07/18/15 0135 07/18/15 0530 07/18/15 1610 07/19/15 0340  WBC 25.4*  --   --  18.0*  --  16.9*  HGB 13.6  --   --  11.2*  --  11.3*  PLT 305  --   --  227  --  255  LABCREA  --   --   --   --  108.88  --   CREATININE 2.64*  < > 2.74* 2.87*  --  3.44*  < > = values in this interval not displayed. Estimated Creatinine Clearance: 10.8 mL/min (by C-G formula based on Cr of 3.44). No results for input(s): VANCOTROUGH, VANCOPEAK, VANCORANDOM, GENTTROUGH, GENTPEAK, GENTRANDOM, TOBRATROUGH, TOBRAPEAK, TOBRARND, AMIKACINPEAK, AMIKACINTROU, AMIKACIN in the last 72 hours.   Microbiology: Recent Results (from the past 720 hour(s))  Urine culture     Status: None   Collection Time: 07/04/15 10:41 PM  Result Value Ref Range Status   Specimen Description URINE, CLEAN CATCH  Final   Special Requests NONE  Final    Culture   Final    >=100,000 COLONIES/mL KLEBSIELLA PNEUMONIAE 30,000 COLONIES/mL ESCHERICHIA COLI Performed at [facility]    Report Status 07/07/2015 FINAL  Final   Organism ID, Bacteria KLEBSIELLA PNEUMONIAE  Final   Organism ID, Bacteria ESCHERICHIA COLI  Final      Susceptibility   Escherichia coli - MIC*    AMPICILLIN 16 INTERMEDIATE Intermediate     CEFAZOLIN <=4 SENSITIVE Sensitive     CEFTRIAXONE <=1 SENSITIVE Sensitive     CIPROFLOXACIN <=0.25 SENSITIVE Sensitive     GENTAMICIN <=1 SENSITIVE Sensitive     IMIPENEM <=0.25 SENSITIVE Sensitive     NITROFURANTOIN <=16 SENSITIVE Sensitive     TRIMETH/SULFA <=20 SENSITIVE Sensitive     AMPICILLIN/SULBACTAM 4 INTERMEDIATE Intermediate     PIP/TAZO <=4 SENSITIVE Sensitive     * 30,000 COLONIES/mL ESCHERICHIA COLI   Klebsiella pneumoniae - MIC*    AMPICILLIN >=32 RESISTANT Resistant     CEFAZOLIN <=4 SENSITIVE Sensitive     CEFTRIAXONE <=1 SENSITIVE Sensitive     CIPROFLOXACIN <=0.25 SENSITIVE Sensitive     GENTAMICIN <=1 SENSITIVE Sensitive     IMIPENEM <=0.25 SENSITIVE Sensitive     NITROFURANTOIN 32 SENSITIVE Sensitive     TRIMETH/SULFA <=20 SENSITIVE Sensitive     AMPICILLIN/SULBACTAM 4 SENSITIVE Sensitive  PIP/TAZO <=4 SENSITIVE Sensitive     * >=100,000 COLONIES/mL KLEBSIELLA PNEUMONIAE  Blood Culture (routine x 2)     Status: None (Preliminary result)   Collection Time: 07/25/2015 11:10 AM  Result Value Ref Range Status   Specimen Description BLOOD LEFT ANTECUBITAL  Final   Special Requests BOTTLES DRAWN AEROBIC AND ANAEROBIC 5ML  Final   Culture   Final    NO GROWTH 2 DAYS [facility]    Report Status PENDING  Incomplete  Blood Culture (routine x 2)     Status: None (Preliminary result)   Collection Time: 07/23/2015 11:40 AM  Result Value Ref Range Status   Specimen Description BLOOD RIGHT WRIST  Final   Special Requests IN PEDIATRIC BOTTLE 5ML  Final   Culture   Final    NO  GROWTH 2 DAYS [facility]    Report Status PENDING  Incomplete  Urine culture     Status: None   Collection Time: 07/29/2015 12:32 PM  Result Value Ref Range Status   Specimen Description URINE, CATHETERIZED  Final   Special Requests NONE  Final   Culture   Final    2,000 COLONIES/mL INSIGNIFICANT GROWTH [facility]formed [facility]    Report Status 07/18/2015 FINAL  Final  MRSA PCR Screening     Status: None   Collection Time: 07/21/2015  7:02 PM  Result Value Ref Range Status   MRSA by PCR NEGATIVE NEGATIVE Final    Comment:        The GeneXpert MRSA Assay (FDA approved for NASAL specimens only), is one component of a comprehensive MRSA colonization surveillance program. It is not intended to diagnose MRSA infection nor to guide or monitor treatment for MRSA infections.     Medical History: Past Medical History  Diagnosis Date  . Hypertension   . History of orthostatic hypotension   . IBS (irritable bowel syndrome)   . Parkinson's disease     ATYPICAL  . Dizziness   . Vertigo   . Fatigue   . Advanced age   . HLD (hyperlipidemia)     intolerant to lipid lowering drugs  . Normal nuclear stress test 2003  . Fall at home 05/2012    mechanical fall  (06/01/2012)  . Pulmonary embolism (HCC) 05/31/2012    off coumadin due to fall/SDH; has filter in place  . Chronic kidney disease, stage 4 (severe)   . Exertional dyspnea   . Arthritis     "legs, fingers" (06/01/2012)  . Subdural hematoma (HCC)      Assessment: [age] y/oF with ALF who presents with ShOB and AMS. Patient noted to have nonproductive cough and congestion. Patient recently treated for UTI and currently being treated for bronchitis. To start Vancomycin and Zosyn for pneumonia.  12/9 >> Vancomycin >> 12/9 >> Zosyn >>    12/9 blood x 2: sent 12/9 urine: sent  Tmax:Afeb WBC 25.4 > 16.9 AKI, SCr cont to increase, 3.44 this am, Cl ~ 12N Lactic Acid: 4.24  Goal of Therapy:   Vancomycin trough level 15-20 mcg/ml  Appropriate antibiotic dosing for renal function and indication Eradication of infection  Plan:   Vancomycin 1g IV x 1 given in ED+ additional 500mg  IV x 1 dose = total loading dose of 1500mg  12/9.  Continue with maintenance dose of Vancomycin 1g IV q48h.  Reduce Zosyn 2.25g IV from q6h to q8hr  Otho BellowsGreen, Johnthomas Lader L PharmD Pager [phone] 07/19/2015, 12:59 PM

## 2015-07-20 LAB — COMPREHENSIVE METABOLIC PANEL
ALBUMIN: 2.4 g/dL — AB (ref 3.5–5.0)
ALK PHOS: 71 U/L (ref 38–126)
ALT: 31 U/L (ref 14–54)
ANION GAP: 13 (ref 5–15)
AST: 33 U/L (ref 15–41)
BILIRUBIN TOTAL: 0.6 mg/dL (ref 0.3–1.2)
BUN: 72 mg/dL — ABNORMAL HIGH (ref 6–20)
CALCIUM: 8.3 mg/dL — AB (ref 8.9–10.3)
CO2: 12 mmol/L — ABNORMAL LOW (ref 22–32)
Chloride: 108 mmol/L (ref 101–111)
Creatinine, Ser: 3.72 mg/dL — ABNORMAL HIGH (ref 0.44–1.00)
GFR calc non Af Amer: 10 mL/min — ABNORMAL LOW (ref >60)
GFR, EST AFRICAN AMERICAN: 11 mL/min — AB (ref >60)
Glucose, Bld: 181 mg/dL — ABNORMAL HIGH (ref 65–99)
Potassium: 3.7 mmol/L (ref 3.5–5.1)
SODIUM: 133 mmol/L — AB (ref 135–145)
TOTAL PROTEIN: 5.7 g/dL — AB (ref 6.5–8.1)

## 2015-07-20 LAB — CBC WITH DIFFERENTIAL/PLATELET
Basophils Absolute: 0 10*3/uL (ref 0.0–0.1)
Basophils Relative: 0 %
EOS ABS: 0 10*3/uL (ref 0.0–0.7)
EOS PCT: 0 %
HCT: 32.8 % — ABNORMAL LOW (ref 36.0–46.0)
Hemoglobin: 11.4 g/dL — ABNORMAL LOW (ref 12.0–15.0)
LYMPHS ABS: 0.4 10*3/uL — AB (ref 0.7–4.0)
Lymphocytes Relative: 2 %
MCH: 29.5 pg (ref 26.0–34.0)
MCHC: 34.8 g/dL (ref 30.0–36.0)
MCV: 85 fL (ref 78.0–100.0)
Monocytes Absolute: 1.5 10*3/uL — ABNORMAL HIGH (ref 0.1–1.0)
Monocytes Relative: 6 %
Neutro Abs: 22.7 10*3/uL — ABNORMAL HIGH (ref 1.7–7.7)
Neutrophils Relative %: 92 %
PLATELETS: 292 10*3/uL (ref 150–400)
RBC: 3.86 MIL/uL — AB (ref 3.87–5.11)
RDW: 14.4 % (ref 11.5–15.5)
WBC: 24.6 10*3/uL — AB (ref 4.0–10.5)

## 2015-07-20 LAB — TROPONIN I
TROPONIN I: 0.16 ng/mL — AB (ref <0.031)
TROPONIN I: 0.15 ng/mL — AB (ref <0.031)

## 2015-07-20 LAB — MAGNESIUM: MAGNESIUM: 2 mg/dL (ref 1.7–2.4)

## 2015-07-20 MED ORDER — LORAZEPAM 0.5 MG PO TABS
0.5000 mg | ORAL_TABLET | Freq: Four times a day (QID) | ORAL | Status: DC | PRN
  Administered 2015-07-20: 0.5 mg via ORAL
  Filled 2015-07-20 (×2): qty 1

## 2015-07-20 MED ORDER — METOPROLOL TARTRATE 25 MG PO TABS
25.0000 mg | ORAL_TABLET | Freq: Three times a day (TID) | ORAL | Status: DC
  Administered 2015-07-20 – 2015-07-21 (×6): 25 mg via ORAL
  Filled 2015-07-20 (×6): qty 1

## 2015-07-20 MED ORDER — HALOPERIDOL LACTATE 5 MG/ML IJ SOLN
5.0000 mg | Freq: Once | INTRAMUSCULAR | Status: AC
  Administered 2015-07-20: 5 mg via INTRAVENOUS
  Filled 2015-07-20: qty 1

## 2015-07-20 MED ORDER — GUAIFENESIN ER 600 MG PO TB12
600.0000 mg | ORAL_TABLET | Freq: Two times a day (BID) | ORAL | Status: DC
  Administered 2015-07-20 – 2015-07-21 (×3): 600 mg via ORAL
  Filled 2015-07-20 (×4): qty 1

## 2015-07-20 MED ORDER — METHYLPREDNISOLONE SODIUM SUCC 125 MG IJ SOLR
60.0000 mg | INTRAMUSCULAR | Status: DC
  Administered 2015-07-20 – 2015-07-21 (×2): 60 mg via INTRAVENOUS
  Filled 2015-07-20: qty 0.96
  Filled 2015-07-20: qty 2

## 2015-07-20 MED ORDER — GUAIFENESIN ER 600 MG PO TB12
1200.0000 mg | ORAL_TABLET | Freq: Two times a day (BID) | ORAL | Status: DC
  Administered 2015-07-20: 1200 mg via ORAL

## 2015-07-20 MED ORDER — IPRATROPIUM-ALBUTEROL 0.5-2.5 (3) MG/3ML IN SOLN
3.0000 mL | Freq: Three times a day (TID) | RESPIRATORY_TRACT | Status: DC
  Administered 2015-07-21 – 2015-07-23 (×7): 3 mL via RESPIRATORY_TRACT
  Filled 2015-07-20 (×9): qty 3

## 2015-07-20 MED ORDER — FAMOTIDINE 20 MG PO TABS
20.0000 mg | ORAL_TABLET | Freq: Every day | ORAL | Status: DC
  Administered 2015-07-20 – 2015-07-21 (×2): 20 mg via ORAL
  Filled 2015-07-20 (×3): qty 1

## 2015-07-20 MED ORDER — GUAIFENESIN ER 600 MG PO TB12
1200.0000 mg | ORAL_TABLET | Freq: Two times a day (BID) | ORAL | Status: DC
  Filled 2015-07-20: qty 2

## 2015-07-20 MED ORDER — POLYETHYLENE GLYCOL 3350 17 G PO PACK: 17.0000 g | PACK | Freq: Every day | ORAL | Status: DC | PRN

## 2015-07-20 MED ORDER — ISOSORBIDE MONONITRATE ER 30 MG PO TB24
30.0000 mg | ORAL_TABLET | Freq: Every day | ORAL | Status: DC
  Administered 2015-07-20 – 2015-07-21 (×2): 30 mg via ORAL
  Filled 2015-07-20 (×2): qty 1

## 2015-07-20 NOTE — Progress Notes (Signed)
Speech Language Pathology Treatment: Dysphagia  Patient Details Name: Nicole BridgemanDorothy M Hayne MRN: [id number] DOB: [birth year] Today's Date: 07/20/2015 Time: [id number] SLP Time Calculation (min) (ACUTE ONLY): 25 min  Assessment / Plan / Recommendation Clinical Impression  Treatment focused on diet tolerance and readiness to advance. Patient lethargic but able to be aroused and cooperative.  Caregiver appropriately feeding am meal with slow rate and small bites. Patient without overt indication of aspiration with pureed solids per skilled observation. SLP initiated trials of liquids for potential to advance given seemingly improved respiratory status based on evaluation documentation. Patient however presented with consistent delayed cough with both thin and nectar thick liquid trials, likely a result of continued difficulty coordinating swallow with apneic period given respiratory compromise. Educated patient and daughter regarding results and recommendations to continue conservative diet. If patient continues to maintain improved strength, would consider trials of upgraded liquid consistencies at bedside 12/13 with possible MBS if s/s of aspiration continue. Daughter reports no difficulty swallowing at baseline although advanced age, h/o COPD, Parkinson's disease, and acute diagnosis of possible aspiration PNA raise concern for some degree of baseline dysphagia which has likely been exacerbated by acute illness.    HPI HPI: Nicole BridgemanDorothy M Rainbow is a 81 y.o. female with Past medical history of pulmonary hypertension, COPD, essential hypertension, Parkinson's disease, history of pulmonary embolism not on any anticoagulation due to recurrent fall, status post IVC filter, chronic kidney disease stage IV. The patient is presenting with complaints of worsening of her chronic shortness of breath. CXR concerning for aspiration, atelectasis, or developing PNA. Previous BSE completed 06/19/12 recommending refular diet  and thin liquids.      SLP Plan  Continue with current plan of care     Recommendations  Diet recommendations: Dysphagia 1 (puree);Pudding-thick liquid Liquids provided via: Teaspoon Medication Administration: Crushed with puree Supervision: Patient able to self feed;Full supervision/cueing for compensatory strategies Compensations: Slow rate;Small sips/bites Postural Changes and/or Swallow Maneuvers: Seated upright 90 degrees              Oral Care Recommendations: Oral care BID Follow up Recommendations: Skilled Nursing facility Plan: Continue with current plan of care  [facility] Dimples Probus MA, CCC-SLP [phone]  Ferdinand LangoMcCoy Hezakiah Champeau Meryl 07/20/2015, 9:44 AM

## 2015-07-20 NOTE — Progress Notes (Addendum)
[facility] Progress Note    Patient: Nicole Harding    ZOX:[id number]  DOB: 1924/01/08     DOA: 07/20/2015 Date of Service: the patient was seen and examined on 07/20/2015  Subjective: The patient is more alert, communicating, denies having any acute complaints, concern about her telephone  Nutrition: Able to tolerate oral diet  Activity: Mostly bedridden at present  Last BM: 07/27/2015  Assessment and Plan: 1. Sepsis (HCC) Mostly pneumonia. Continues to have leukocytosis and bandemia. No fever.  No sputum specimen, blood culture negative urine culture insignificant growth . Vancomycin will be discontinued, continue Zosyn, reduce Solu-Medrol to every 24 hours, continue duo nebs and Mucinex  2. Acute on chronic kidney disease Renal function continues to worsen, ultrasound renal is negative for any obstruction, continue Foley catheter due to urinary retention. Most likely ATN in the setting of sepsis. Continue IV hydration.  3. Hyponatremia. Resolved.  4. Atrial fibrillation with RVR CHA2DS2-VASc Score 7 Not on any intercurrent relation due to prior history of subdural hematoma with multiple fall. Continue aspirin. We will transition to Lopressor since the rate is controlled.  5. Essential hypertension Blood pressure significantly elevated. We'll resume her Lopressor  6. Dysphagia Acute encephalopathy CT head unremarkable, most likely delirium secondary to sepsis Continue aspirin Speech therapy consulted with further recommendation  7. Diarrhea  No BM here in the hospital, will resume stool softner   DVT Prophylaxis: subcutaneous Heparin Nutrition:  Dysphagia type I diet  Advance goals of care discussion: DNR/DNI  Brief Summary of Hospitalization:  HPI: As per the H and P dictated on admission, "Nicole [name] is a 81 y.o. female with Past medical history of pulmonary hypertension, COPD, essential hypertension, Parkinson's disease, history of pulmonary  embolism not on any anticoagulation due to recurrent fall, status post IVC filter, chronic kidney disease stage IV. The patient is presenting with complaints of worsening of her chronic shortness of breath. The patient's chronic pulmonary hypertension as well as COPD because of which she has chronic shortness of breath. For last 2 days her symptoms have been progressively worsening. She was seen by her PCP and she was started on azithromycin as well as prednisone 20 mg as an outpatient. Despite this she continues to have further worsening of shortness of breath. She also had episodes of diarrhea 2, lose watery with foul-smelling yesterday. She had one episode of vomiting earlier in the morning as well. No episodes of aspiration reported by family. She is a resident at assisted living facility. She denies any complaints of abdominal pain nausea or vomiting at the time of my evaluation. She does complains of chest congestion and shortness of breath. She denies any dizziness or lightheadedness. At her baseline the patient is able to carry out a conversation and is able to identify the family member. She generally walks with a walker." Daily update: 07/18/2015, CT scan of the head and negative, speech therapy recommends dysphagia type I diet. 07/19/2015 ultrasound renal does not show any obstruction and shows chronic medical disease. 07/20/2015 transition out of the ICU.  Consultants: None Procedures: Echocardiogram, ejection fraction 60-65%, no wall motion abnormality,  Antibiotics: Anti-infectives    Start     Dose/Rate Route Frequency Ordered Stop   07/19/15 2000  piperacillin-tazobactam (ZOSYN) IVPB 2.25 g     2.25 g 100 mL/hr over 30 Minutes Intravenous Every 8 hours 07/19/15 1245     07/19/15 1800  vancomycin (VANCOCIN) IVPB 1000 mg/200 mL premix  Status:  Discontinued  1,000 mg 200 mL/hr over 60 Minutes Intravenous Every 48 hours 07/19/15 1256 07/20/15 1404   07/19/15 1300  vancomycin  (VANCOCIN) IVPB 1000 mg/200 mL premix  Status:  Discontinued     1,000 mg 200 mL/hr over 60 Minutes Intravenous Every 48 hours 08/01/2015 1310 07/19/15 1244   07/16/2015 1800  piperacillin-tazobactam (ZOSYN) IVPB 2.25 g  Status:  Discontinued     2.25 g 100 mL/hr over 30 Minutes Intravenous 4 times per day 07/18/2015 1310 07/19/15 1245   07/25/2015 1315  vancomycin (VANCOCIN) 500 mg in sodium chloride 0.9 % 100 mL IVPB     500 mg 100 mL/hr over 60 Minutes Intravenous STAT 07/28/2015 1310 08/03/2015 1420   07/13/2015 1115  vancomycin (VANCOCIN) IVPB 1000 mg/200 mL premix     1,000 mg 200 mL/hr over 60 Minutes Intravenous  Once 07/14/2015 1107 08/01/2015 1243   07/10/2015 1115  piperacillin-tazobactam (ZOSYN) IVPB 3.375 g     3.375 g 100 mL/hr over 30 Minutes Intravenous STAT 07/23/2015 1112 07/16/2015 1157      Family Communication: family was informed over phone, at the time of interview.  Opportunity was given to ask question and all questions were answered satisfactorily.   Disposition:  Barriers to safe discharge: renal function   Intake/Output Summary (Last 24 hours) at 07/20/15 1404 Last data filed at 07/20/15 1202  Gross per 24 hour  Intake 2357.8 ml  Output   1180 ml  Net 1177.8 ml   Filed Weights   07/13/2015 1835 07/19/15 0557 07/20/15 0415  Weight: 67.4 kg (148 lb 9.4 oz) 71.1 kg (156 lb 12 oz) 75 kg (165 lb 5.5 oz)    Objective: Physical Exam: Filed Vitals:   07/20/15 0840 07/20/15 0900 07/20/15 1100 07/20/15 1200  BP: 173/60 165/62 163/119 167/56  Pulse: 87 81 82 74  Temp:    97.4 F (36.3 C)  TempSrc:    Oral  Resp: Height:      Weight:      SpO2: 92% 95% 97% 95%     General: Appear in mild distress, no Rash; Oral Mucosa moist. Cardiovascular: S1 and S2 Present, no Murmur, mild JVD Respiratory: Bilateral Air entry present and improved bilateral Crackles, no wheezes Abdomen: Bowel Sound present, Soft and no tenderness Extremities: no Pedal edema, no calf  tenderness Neurology: Grossly no focal neuro deficit.  Data Reviewed: CBC:  Recent Labs Lab 07/22/2015 1120 07/18/15 0530 07/19/15 0340 07/20/15 0120  WBC 25.4* 18.0* 16.9* 24.6*  NEUTROABS 23.3* 16.2* 15.2* 22.7*  HGB 13.6 11.2* 11.3* 11.4*  HCT 39.8 33.6* 33.1* 32.8*  MCV 87.7 88.0 86.6 85.0  PLT 305 227 255 292   Basic Metabolic Panel:  Recent Labs Lab 07/21/2015 1120  07/25/2015 2055 07/18/15 0135 07/18/15 0530 07/19/15 0340 07/20/15 0110  NA 126*  < > 129* 130* 129* 132* 133*  K 4.3  < > 4.4 4.4 4.2 3.9 3.7  CL 97*  < > 103 103 102 107 108  CO2 14*  < > 14* 15* 15* 15* 12*  GLUCOSE 166*  < > 110* 121* 157* 140* 181*  BUN 46*  < > 46* 48* 49* 66* 72*  CREATININE 2.64*  < > 2.56* 2.74* 2.87* 3.44* 3.72*  CALCIUM 8.7*  < > 8.4* 8.3* 7.9* 7.8* 8.3*  MG 1.6*  --   --   --   --   --  2.0  < > = values in this interval not displayed. Liver  Function Tests:  Recent Labs Lab 2015/08/07 1120 08-07-15 1448 07/19/15 0340 07/20/15 0110  AST 91* 81* 38 33  ALT 36 34 32 31  ALKPHOS 76 67 58 71  BILITOT 1.2 0.9 0.8 0.6  PROT 6.9 6.0* 5.3* 5.7*  ALBUMIN 3.5 3.1* 2.3* 2.4*   No results for input(s): LIPASE, AMYLASE in the last 168 hours. No results for input(s): AMMONIA in the last 168 hours.  Cardiac Enzymes:  Recent Labs Lab 08/07/15 1447 2015-08-07 2055 07/19/15 1353 07/19/15 1912 07/20/15 0110  TROPONINI 0.73* 1.07* 0.16* 0.15* 0.15*   BNP (last 3 results) No results for input(s): BNP in the last 8760 hours.  ProBNP (last 3 results) No results for input(s): PROBNP in the last 8760 hours.   CBG: No results for input(s): GLUCAP in the last 168 hours.  Recent Results (from the past 240 hour(s))  Blood Culture (routine x 2)     Status: None (Preliminary result)   Collection Time: 08/07/15 11:10 AM  Result Value Ref Range Status   Specimen Description BLOOD LEFT ANTECUBITAL  Final   Special Requests BOTTLES DRAWN AEROBIC AND ANAEROBIC  Final   Culture    Final    NO GROWTH 2 DAYS Performed at [facility]    Report Status PENDING  Incomplete  Blood Culture (routine x 2)     Status: None (Preliminary result)   Collection Time: Aug 07, 2015 11:40 AM  Result Value Ref Range Status   Specimen Description BLOOD RIGHT WRIST  Final   Special Requests IN PEDIATRIC BOTTLE  Final   Culture   Final    NO GROWTH 2 DAYS Performed at [facility]    Report Status PENDING  Incomplete  Urine culture     Status: None   Collection Time: August 07, 2015 12:32 PM  Result Value Ref Range Status   Specimen Description URINE, CATHETERIZED  Final   Special Requests NONE  Final   Culture   Final    2,000 COLONIES/mL INSIGNIFICANT GROWTH Performed at [facility]    Report Status 07/18/2015 FINAL  Final  MRSA PCR Screening     Status: None   Collection Time: 08-07-15  7:02 PM  Result Value Ref Range Status   MRSA by PCR NEGATIVE NEGATIVE Final    Comment:        The GeneXpert MRSA Assay (FDA approved for NASAL specimens only), is one component of a comprehensive MRSA colonization surveillance program. It is not intended to diagnose MRSA infection nor to guide or monitor treatment for MRSA infections.      Studies: No results found.   Scheduled Meds: . aspirin  81 mg Oral Daily  . famotidine  20 mg Oral Daily  . guaiFENesin  600 mg Oral BID  . heparin  5,000 Units Subcutaneous 3 times per day  . ipratropium-albuterol  3 mL Nebulization Q6H  . isosorbide mononitrate  30 mg Oral Daily  . methylPREDNISolone (SOLU-MEDROL) injection  60 mg Intravenous Q24H  . metoprolol tartrate  25 mg Oral TID  . piperacillin-tazobactam (ZOSYN)  IV  2.25 g Intravenous Q8H   Continuous Infusions:  PRN Meds: acetaminophen, fentaNYL (SUBLIMAZE) injection, hydrALAZINE, ondansetron (ZOFRAN) IV, RESOURCE THICKENUP CLEAR  Time spent: 30 minutes  Author: Lynden Oxford, MD [facility] Pager: [phone] 07/20/2015 2:04 PM  If 7PM-7AM,  please contact night-coverage at [email or web address], password [facility]

## 2015-07-20 NOTE — Progress Notes (Signed)
Date: July 20, 2015 Chart reviewed for concurrent status and case management needs. Will continue to follow patient for changes and needs: Rhonda Davis, RN, BSN, CCM   [phone] 

## 2015-07-20 NOTE — NC FL2 (Signed)
[place] MEDICAID FL2 LEVEL OF CARE SCREENING TOOL     IDENTIFICATION  Patient Name: Nicole BridgemanDorothy M Pottle Birthdate: 08/16/1923 Sex: female Admission Date (Current Location): 07/15/2015  [facility] [place]     Facility and Address:  [facility],  [address], [place] [id number]      Provider Number: [id number]  Attending Physician Name and Address:  Rolly SalterPranav M Patel, MD  Relative Name and Phone Number:       Current Level of Care: Hospital Recommended Level of Care: Skilled Nursing Facility Prior Approval Number:    Date Approved/Denied:   PASRR Number: [id number]  Discharge Plan: SNF    Current Diagnoses: Patient Active Problem List   Diagnosis Date Noted  . Paroxysmal atrial fibrillation with rapid ventricular response (HCC), CHA2DS2-VASc Score 7 07/19/2015  . Urinary retention 07/18/2015  . Sepsis (HCC) 2015/07/01  . Hyponatremia 2015/07/01  . Acute-on-chronic kidney injury (HCC) 2015/07/01  . Elevated troponin 2015/07/01  . Hypomagnesemia 2015/07/01  . Pulmonary hypertension (HCC) 2015/07/01  . Lactic acidosis 2015/07/01  . Diarrhea 2015/07/01  . Lung mass 06/23/2014  . Pulmonary hypertensive arterial disease (HCC) 06/23/2014  . Near syncope 06/20/2014  . Malignant hypertension 06/20/2014  . Oxygen dependent 06/20/2014  . Dyspnea 07/18/2013  . GERD (gastroesophageal reflux disease) 06/23/2013  . Chronic respiratory failure (HCC) 06/23/2013  . Emphysema 06/11/2013  . Cold intolerance 06/11/2013  . Ataxia 04/12/2013  . Depression 03/06/2013  . History of pulmonary embolism 06/19/2012  . Warfarin anticoagulation 06/19/2012  . UTI (lower urinary tract infection) due to pseudomonas 06/19/2012  . SDH (subdural hematoma) (HCC) 06/18/2012  . Metabolic encephalopathy 06/18/2012  . Sepsis due to Pseudomonas (HCC) 06/18/2012  . Cervical myelopathy (HCC) 06/07/2012  . Rigors 06/05/2012  . Pulmonary embolism (HCC) 06/01/2012  . UTI  (urinary tract infection) 06/01/2012  . Pain in left shoulder  06/01/2012  . Nausea 06/01/2012  . CKD (chronic kidney disease) stage 4, GFR 15-29 ml/min (HCC) 06/01/2012  . HTN (hypertension) 06/01/2012  . Accelerated hypertension 06/01/2012  . Fall-recurrent 06/01/2012  . Benign hypertensive heart disease without heart failure 12/13/2011  . Dizziness - light-headed 12/13/2011  . Chronic renal insufficiency, stage III (moderate) 12/13/2011  . Pure hypercholesterolemia 09/26/2011  . Vertigo   . History of orthostatic hypotension 02/21/2011  . Irritable bowel syndrome 02/21/2011  . Postmenopausal state 02/21/2011    Orientation RESPIRATION BLADDER Height & Weight    Self  O2 Incontinent, Indwelling catheter 5\' 7"  (170.2 cm) 143 lbs.  BEHAVIORAL SYMPTOMS/MOOD NEUROLOGICAL BOWEL NUTRITION STATUS  Other (Comment) (no behaviors)   Incontinent Diet (DYS 1)  AMBULATORY STATUS COMMUNICATION OF NEEDS Skin   Extensive Assist Verbally Normal                       Personal Care Assistance Level of Assistance  Dressing Bathing Assistance: Maximum assistance Feeding assistance: Limited assistance Dressing Assistance: Maximum assistance     Functional Limitations Info  Sight, Hearing, Speech Sight Info: Adequate Hearing Info: Adequate Speech Info: Adequate    SPECIAL CARE FACTORS FREQUENCY  PT (By licensed PT)     PT Frequency: 5 x wk              Contractures Contractures Info: Not present    Additional Factors Info  Code Status, Allergies Code Status Info: DNR             Current Medications (07/20/2015):  This is the current hospital active medication list  Current Facility-Administered Medications  Medication Dose Route Frequency Provider Last Rate Last Dose  . acetaminophen (TYLENOL) tablet 650 mg  650 mg Oral Q6H PRN Rolly Salter, MD   650 mg at 07/19/15 2038  . aspirin chewable tablet 81 mg  81 mg Oral Daily Rolly Salter, MD   81 mg at 07/20/15 1133   . famotidine (PEPCID) tablet 20 mg  20 mg Oral Daily Rolly Salter, MD   20 mg at 07/20/15 1133  . fentaNYL (SUBLIMAZE) injection 25-50 mcg  25-50 mcg Intravenous Q4H PRN Rolly Salter, MD      . guaiFENesin [facility]) 12 hr tablet 600 mg  600 mg Oral BID Rolly Salter, MD      . heparin injection 5,000 Units  5,000 Units Subcutaneous 3 times per day Rolly Salter, MD   5,000 Units at 07/20/15 1422  . hydrALAZINE (APRESOLINE) injection 10 mg  10 mg Intravenous Q4H PRN Rolly Salter, MD   10 mg at 07/19/15 2036  . ipratropium-albuterol (DUONEB) 0.5-2.5 (3) MG/3ML nebulizer solution 3 mL  3 mL Nebulization Q6H Rolly Salter, MD   3 mL at 07/20/15 1458  . isosorbide mononitrate (IMDUR) 24 hr tablet 30 mg  30 mg Oral Daily Rolly Salter, MD   30 mg at 07/20/15 1133  . LORazepam (ATIVAN) tablet 0.5 mg  0.5 mg Oral Q6H PRN Rolly Salter, MD      . methylPREDNISolone sodium succinate (SOLU-MEDROL) 125 mg/2 mL injection 60 mg  60 mg Intravenous Q24H Rolly Salter, MD   60 mg at 07/20/15 1134  . metoprolol tartrate (LOPRESSOR) tablet 25 mg  25 mg Oral TID Rolly Salter, MD   25 mg at 07/20/15 1554  . ondansetron (ZOFRAN) injection 4 mg  4 mg Intravenous Q6H PRN Rolly Salter, MD   4 mg at 07/19/15 0029  . piperacillin-tazobactam (ZOSYN) IVPB 2.25 g  2.25 g Intravenous Q8H Otho Bellows, RPH   2.25 g at 07/20/15 1202  . polyethylene glycol (MIRALAX / GLYCOLAX) packet 17 g  17 g Oral Daily PRN Rolly Salter, MD      . RESOURCE THICKENUP CLEAR   Oral PRN Carita Pian, RN         Discharge Medications: Please see discharge summary for a list of discharge medications.  Relevant Imaging Results:  Relevant Lab Results:   Additional Information SS # [id number]  Terina Mcelhinny, Dickey Gave, LCSW

## 2015-07-20 NOTE — Progress Notes (Signed)
PT Cancellation Note  Patient Details Name: Nigel BridgemanDorothy M Bentz MRN: [id number] DOB: Sep 16, 1923   Cancelled Treatment:    Reason Eval/Treat Not Completed: Other (comment) (has multiple visitors at present. will check back later or tomorrow. )   Rada HayHill, Araina Butrick Elizabeth 07/20/2015, 3:18 PM

## 2015-07-20 NOTE — Progress Notes (Signed)
Pt received to room 1406.  Report received from J. Thigpen,RN. Nicole Harding, Nicole Harding

## 2015-07-20 NOTE — Clinical Social Work Note (Signed)
Clinical Social Work Assessment  Patient Details  Name: Nicole Harding MRN: [id number] Date of Birth: Nov 15, 1923  Date of referral:  07/20/15               Reason for consult:  Facility Placement, Discharge Planning                Permission sought to share information with:  Facility Art therapist granted to share information::  Yes, Verbal Permission Granted  Name::        Agency::     Relationship::     Contact Information:     Housing/Transportation Living arrangements for the past 2 months:  [place] of Information:  Adult Children Patient Interpreter Needed:  None Criminal Activity/Legal Involvement Pertinent to Current Situation/Hospitalization:  No - Comment as needed Significant Relationships:  Adult Children Lives with:  Self Do you feel safe going back to the place where you live?   (SNF placement needed.) Need for family participation in patient care:  Yes (Comment)  Care giving concerns:  Pt's care cannot be managed at apt with hired assistance following hospital d/c.   Social Worker assessment / plan:  Pt hospitalized on 07/11/2015 with sepsis. Pt is from [place] independent living. CSW met with pt's daughter / son to assist with d/c planning. Family reports pt has been paying for additional support at her apt at [facility] but they feel SNF placement will be needed at d/c. Daughter would like pt to be [place] closer to [place] at [place]/c. CSW has requested family provide CSW with 3 SNF's in [place] that they would like to consider for placement. CSW will initiate SNF search once PT eval is completed and SNF choices are provided.   Employment status:  Retired Nurse, adult PT Recommendations:  Not assessed at this time Information / Referral to community resources:  [place]  Patient/Family's Response to care:  Family feels SNF placement is needed.  Patient/Family's  Understanding of and Emotional Response to Diagnosis, Current Treatment, and Prognosis:  Pt is oriented x 1 and unable to participate in d/c planning at this time. Family has spoken with MD today and are aware of pt's medical status. Daughter would like pt as close to her as possible to spend to be able to spend more time with her. In the past, pt preferred to stay in [place].  Emotional Assessment Appearance:  Appears stated age Attitude/Demeanor/Rapport:  Other (cooperative) Affect (typically observed):  Calm Orientation:  Oriented to Self Alcohol / Substance use:  Not Applicable Psych involvement (Current and /or in the community):  No (Comment)  Discharge Needs  Concerns to be addressed:  Discharge Planning Concerns Readmission within the last 30 days:  No Current discharge risk:  None Barriers to Discharge:  No Barriers Identified   Luretha Rued, [place] 07/20/2015, 4:02 PM

## 2015-07-20 NOTE — Plan of Care (Signed)
Problem: Education: Goal: Knowledge of [place] [facility] Education information/materials will improve Outcome: Not Progressing Pt confused   

## 2015-07-21 ENCOUNTER — Inpatient Hospital Stay (HOSPITAL_COMMUNITY): Payer: Medicare Other

## 2015-07-21 LAB — CBC WITH DIFFERENTIAL/PLATELET
BASOS ABS: 0 10*3/uL (ref 0.0–0.1)
Basophils Relative: 0 %
EOS ABS: 0 10*3/uL (ref 0.0–0.7)
Eosinophils Relative: 0 %
HEMATOCRIT: 36 % (ref 36.0–46.0)
HEMOGLOBIN: 12.8 g/dL (ref 12.0–15.0)
LYMPHS PCT: 2 %
Lymphs Abs: 0.5 10*3/uL — ABNORMAL LOW (ref 0.7–4.0)
MCH: 29.6 pg (ref 26.0–34.0)
MCHC: 35.6 g/dL (ref 30.0–36.0)
MCV: 83.1 fL (ref 78.0–100.0)
MONOS PCT: 4 %
Monocytes Absolute: 1.1 10*3/uL — ABNORMAL HIGH (ref 0.1–1.0)
NEUTROS PCT: 94 %
Neutro Abs: 25.2 10*3/uL — ABNORMAL HIGH (ref 1.7–7.7)
Platelets: 304 10*3/uL (ref 150–400)
RBC: 4.33 MIL/uL (ref 3.87–5.11)
RDW: 14.8 % (ref 11.5–15.5)
WBC: 26.8 10*3/uL — AB (ref 4.0–10.5)

## 2015-07-21 LAB — COMPREHENSIVE METABOLIC PANEL
ALBUMIN: 2.7 g/dL — AB (ref 3.5–5.0)
ALK PHOS: 81 U/L (ref 38–126)
ALT: 40 U/L (ref 14–54)
ANION GAP: 14 (ref 5–15)
AST: 48 U/L — AB (ref 15–41)
BUN: 89 mg/dL — AB (ref 6–20)
CALCIUM: 8.5 mg/dL — AB (ref 8.9–10.3)
CO2: 11 mmol/L — AB (ref 22–32)
Chloride: 109 mmol/L (ref 101–111)
Creatinine, Ser: 4.28 mg/dL — ABNORMAL HIGH (ref 0.44–1.00)
GFR calc Af Amer: 10 mL/min — ABNORMAL LOW (ref >60)
GFR calc non Af Amer: 8 mL/min — ABNORMAL LOW (ref >60)
GLUCOSE: 128 mg/dL — AB (ref 65–99)
Potassium: 4.1 mmol/L (ref 3.5–5.1)
SODIUM: 134 mmol/L — AB (ref 135–145)
Total Bilirubin: 0.8 mg/dL (ref 0.3–1.2)
Total Protein: 5.9 g/dL — ABNORMAL LOW (ref 6.5–8.1)

## 2015-07-21 MED ORDER — HYDRALAZINE HCL 25 MG PO TABS
25.0000 mg | ORAL_TABLET | Freq: Four times a day (QID) | ORAL | Status: DC
Start: 2015-07-21 — End: 2015-07-22
  Administered 2015-07-21 – 2015-07-22 (×3): 25 mg via ORAL
  Filled 2015-07-21 (×5): qty 1

## 2015-07-21 MED ORDER — ISOSORBIDE MONONITRATE ER 60 MG PO TB24
60.0000 mg | ORAL_TABLET | Freq: Every day | ORAL | Status: DC
  Filled 2015-07-21: qty 1

## 2015-07-21 MED ORDER — ISOSORBIDE MONONITRATE ER 30 MG PO TB24
30.0000 mg | ORAL_TABLET | Freq: Once | ORAL | Status: AC
  Administered 2015-07-21: 30 mg via ORAL
  Filled 2015-07-21: qty 1

## 2015-07-21 MED ORDER — SODIUM CHLORIDE 0.9 % IV SOLN
INTRAVENOUS | Status: DC
  Administered 2015-07-21 – 2015-07-22 (×2): via INTRAVENOUS

## 2015-07-21 NOTE — Evaluation (Signed)
Physical Therapy Evaluation Patient Details Name: Nicole BridgemanDorothy M Harding MRN: [id number] DOB: 1923/12/16 Today's Date: 07/21/2015   History of Present Illness  81 y.o. female with Past medical history of pulmonary hypertension, COPD, essential hypertension, Parkinson's disease, pulmonary embolism not on any anticoagulation due to recurrent fall, status post IVC filter, chronic kidney disease stage IV and admitted with sepsis, also found to have afib with RVR  Clinical Impression  Pt admitted with above diagnosis. Pt currently with functional limitations due to the deficits listed below (see PT Problem List).   Pt will benefit from skilled PT to increase their independence and safety with mobility to allow discharge to the venue listed below.  Pt would benefit from SNF upon d/c.  Daughter present during evaluation and states pt is not at her baseline in regards to mobility.     Follow Up Recommendations SNF    Equipment Recommendations  None recommended by PT    Recommendations for Other Services       Precautions / Restrictions        Mobility  Bed Mobility Overal bed mobility: Needs Assistance Bed Mobility: Supine to Sit;Sit to Supine     Supine to sit: Max assist;HOB elevated Sit to supine: Max assist   General bed mobility comments: increased assist for upper and lower body, utilized bed positioning and bed pad   Transfers Overall transfer level:  (pt unable with +1, would need +2)                  Ambulation/Gait                Stairs            Wheelchair Mobility    Modified Rankin (Stroke Patients Only)       Balance Overall balance assessment: Needs assistance Sitting-balance support: Bilateral upper extremity supported;Feet supported Sitting balance-Leahy Scale: Zero Sitting balance - Comments: requires trunk support in sitting, improved to min assist with UEs holding RW Postural control: Posterior lean                                    Pertinent Vitals/Pain Pain Assessment: No/denies pain    Home Living Family/patient expects to be discharged to:: Assisted living               Home Equipment: Walker - 2 wheels      Prior Function Level of Independence: Needs assistance   Gait / Transfers Assistance Needed: able to ambulate with RW, daughter states more recently ALF bringing pt her meals and less mobility           Hand Dominance        Extremity/Trunk Assessment   Upper Extremity Assessment: Generalized weakness           Lower Extremity Assessment: Generalized weakness         Communication   Communication: HOH  Cognition Arousal/Alertness: Awake/alert Behavior During Therapy: WFL for tasks assessed/performed Overall Cognitive Status: Impaired/Different from baseline                 General Comments: able to participate and follow commands however family reports pt still has some confusion (although improved since yesterday)    General Comments      Exercises        Assessment/Plan    PT Assessment Patient needs continued PT services  PT Diagnosis Difficulty walking;Generalized weakness  PT Problem List Decreased strength;Decreased activity tolerance;Decreased mobility;Decreased balance;Decreased knowledge of use of DME;Decreased cognition  PT Treatment Interventions DME instruction;Gait training;Functional mobility training;Patient/family education;Therapeutic activities;Therapeutic exercise;Balance training   PT Goals (Current goals can be found in the Care Plan section) Acute Rehab PT Goals PT Goal Formulation: With patient/family Time For Goal Achievement: 07/28/15 Potential to Achieve Goals: Good    Frequency Min 3X/week   Barriers to discharge        Co-evaluation               End of Session Equipment Utilized During Treatment: Oxygen Activity Tolerance: Patient limited by fatigue Patient left: in bed;with call bell/phone  within reach;with bed alarm set;with family/visitor present           Time: 3382-5053 PT Time Calculation (min) (ACUTE ONLY): 19 min   Charges:   PT Evaluation $Initial PT Evaluation Tier I: 1 Procedure     PT G Codes:        Minor Iden,KATHrine E 07/21/2015, 1:17 PM Zenovia Jarred, PT, DPT 07/21/2015 Pager: [phone]

## 2015-07-21 NOTE — Care Management Important Message (Signed)
Important Message  Patient Details  Name: Nicole BridgemanDorothy M Thedford MRN: [id number] Date of Birth: 1924-02-08   Medicare Important Message Given:  Yes    Haskell FlirtJamison, Xochilth Standish 07/21/2015, 9:22 AMImportant Message  Patient Details  Name: Nicole BridgemanDorothy M Mullet MRN: [id number] Date of Birth: 1924-02-08   Medicare Important Message Given:  Yes    Haskell FlirtJamison, Alvin Rubano 07/21/2015, 9:21 AM

## 2015-07-21 NOTE — Progress Notes (Signed)
Speech Language Pathology Treatment: Dysphagia  Patient Details Name: Nicole BridgemanDorothy M Harding MRN: [id number] DOB: May 10, 1924 Today's Date: 07/21/2015 Time: 0810-0821 SLP Time Calculation (min) (ACUTE ONLY): 11 min  Assessment / Plan / Recommendation Clinical Impression  Pt demonstrated particularly poor tolerance of pudding thick textures today with immediate and prolonged coughing. When trials of nectar via teaspoon were given, pt did not demonstrate signs of aspiration. Given inconsistent findings and highly restrictive diet recommendations currently, recommend proceeding with objective swallow assessment for best diet recommendations and plan of care. Will f/u with MBS today. Pt may continue current diet until assessment complete.    HPI HPI: Nicole Harding is a 81 y.o. female with Past medical history of pulmonary hypertension, COPD, essential hypertension, Parkinson's disease, history of pulmonary embolism not on any anticoagulation due to recurrent fall, status post IVC filter, chronic kidney disease stage IV. The patient is presenting with complaints of worsening of her chronic shortness of breath. CXR concerning for aspiration, atelectasis, or developing PNA. Previous BSE completed 06/19/12 recommending refular diet and thin liquids.      SLP Plan  MBS     Recommendations  Diet recommendations: Dysphagia 1 (puree);Pudding-thick liquid Liquids provided via: Teaspoon Medication Administration: Crushed with puree Supervision: Full supervision/cueing for compensatory strategies;Staff to assist with self feeding Compensations: Slow rate;Small sips/bites Postural Changes and/or Swallow Maneuvers: Seated upright 90 degrees              Oral Care Recommendations: Oral care BID Follow up Recommendations: Skilled Nursing facility Plan: MBS  Harlon DittyBonnie Kameisha Malicki, [place] CCC-SLP [phone]  Claudine MoutonDeBlois, Ameen Mostafa Caroline 07/21/2015, 9:03 AM

## 2015-07-21 NOTE — Progress Notes (Signed)
[facility] Progress Note    Patient: Nicole Harding   NWG:[id number]  DOB: February 11, 1924     DOA: 07/16/2015 Date of Service: the patient was seen and examined on 07/21/2015  Subjective: Patient is lethargic today. No reports of agitation Nutrition: Able to tolerate oral diet  Activity: Mostly bedridden at present  Last BM: 07/22/2015  Assessment and Plan: 1. Sepsis (HCC) No sputum specimen, blood culture negative urine culture insignificant growth . continue Zosyn, continue duo nebs and Mucinex Reduce Solu-Medrol to 40 mg prednisone dose   2. Acute on chronic kidney disease Renal function continues to worsen, ultrasound renal is negative for any obstruction, continue Foley catheter due to urinary retention. Most likely ATN in the setting of sepsis. Continue IV hydration. appreciate input from nephrology   3. Hyponatremia. Resolved.  4. Atrial fibrillation with RVR CHA2DS2-VASc Score 7 Not on any anticoagulation due to prior history of subdural hematoma with multiple fall. Continue aspirin. Continue Lopressor   5. Essential hypertension Blood pressure significantly elevated. We'll resume her Lopressor Also continue hydralazine and Imdur.   6. Dysphagia Acute encephalopathy CT head unremarkable, most likely delirium secondary to sepsis Continue aspirin Speech therapy consulted with further recommendation  7. Diarrhea  No BM here in the hospital Continue stool softener   DVT Prophylaxis: subcutaneous Heparin Nutrition:  Dysphagia type I diet  Advance goals of care discussion: DNR/DNI  Brief Summary of Hospitalization:  HPI: As per the H and P dictated on admission, "Nicole Harding is a 81 y.o. female with Past medical history of pulmonary hypertension, COPD, essential hypertension, Parkinson's disease, history of pulmonary embolism not on any anticoagulation due to recurrent fall, status post IVC filter, chronic kidney disease stage IV. The patient is  presenting with complaints of worsening of her chronic shortness of breath. The patient's chronic pulmonary hypertension as well as COPD because of which she has chronic shortness of breath. For last 2 days her symptoms have been progressively worsening. She was seen by her PCP and she was started on azithromycin as well as prednisone 20 mg as an outpatient. Despite this she continues to have further worsening of shortness of breath. She also had episodes of diarrhea 2, lose watery with foul-smelling yesterday. She had one episode of vomiting earlier in the morning as well. No episodes of aspiration reported by family. She is a resident at assisted living facility. She denies any complaints of abdominal pain nausea or vomiting at the time of my evaluation. She does complains of chest congestion and shortness of breath. She denies any dizziness or lightheadedness. At her baseline the patient is able to carry out a conversation and is able to identify the family member. She generally walks with a walker." Daily update: 07/18/2015, CT scan of the head and negative, speech therapy recommends dysphagia type I diet. 07/19/2015 ultrasound renal does not show any obstruction and shows chronic medical disease. 07/20/2015 transition out of the ICU. 07/21/2015 NEPHROLOGY CONSULTED   Consultants: None Procedures: Echocardiogram, ejection fraction 60-65%, no wall motion abnormality,  Antibiotics: Anti-infectives    Start     Dose/Rate Route Frequency Ordered Stop   07/19/15 2000  piperacillin-tazobactam (ZOSYN) IVPB 2.25 g     2.25 g 100 mL/hr over 30 Minutes Intravenous Every 8 hours 07/19/15 1245     07/19/15 1800  vancomycin (VANCOCIN) IVPB 1000 mg/200 mL premix  Status:  Discontinued     1,000 mg 200 mL/hr over 60 Minutes Intravenous Every 48 hours 07/19/15 1256 07/20/15  1404   07/19/15 1300  vancomycin (VANCOCIN) IVPB 1000 mg/200 mL premix  Status:  Discontinued     1,000 mg 200 mL/hr over 60  Minutes Intravenous Every 48 hours 07/19/2015 1310 07/19/15 1244   08/02/2015 1800  piperacillin-tazobactam (ZOSYN) IVPB 2.25 g  Status:  Discontinued     2.25 g 100 mL/hr over 30 Minutes Intravenous 4 times per day 07/09/2015 1310 07/19/15 1245   08/07/2015 1315  vancomycin (VANCOCIN) 500 mg in sodium chloride 0.9 % 100 mL IVPB     500 mg 100 mL/hr over 60 Minutes Intravenous STAT 08/01/2015 1310 07/21/2015 1420   07/25/2015 1115  vancomycin (VANCOCIN) IVPB 1000 mg/200 mL premix     1,000 mg 200 mL/hr over 60 Minutes Intravenous  Once 07/12/2015 1107 07/25/2015 1243   07/09/2015 1115  piperacillin-tazobactam (ZOSYN) IVPB 3.375 g     3.375 g 100 mL/hr over 30 Minutes Intravenous STAT 08/06/2015 1112 08/03/2015 1157      Family Communication: family was informed over phone, at the time of interview.  Opportunity was given to ask question and all questions were answered satisfactorily.   Disposition:  Barriers to safe discharge: renal function   Intake/Output Summary (Last 24 hours) at 07/21/15 1723 Last data filed at 07/21/15 1500  Gross per 24 hour  Intake    640 ml  Output   1025 ml  Net   -385 ml   Filed Weights   07/19/15 0557 07/20/15 0415 07/21/15 0520  Weight: 71.1 kg (156 lb 12 oz) 75 kg (165 lb 5.5 oz) 70.4 kg (155 lb 3.3 oz)    Objective: Physical Exam: Filed Vitals:   07/21/15 0741 07/21/15 0904 07/21/15 1420 07/21/15 1517  BP:  172/100  189/96  Pulse:  97  91  Temp:    97.4 F (36.3 C)  TempSrc:    Axillary  Resp:      Height:      Weight:      SpO2: 92%  94% 94%     General: Appear in mild distress, no Rash; Oral Mucosa is coated with barium. Cardiovascular: S1 and S2 Present, no Murmur, mild JVD Respiratory: Bilateral Air entry present and improved bilateral Crackles, no wheezes Abdomen: Bowel Sound present, Soft and no tenderness Extremities: no Pedal edema, no calf tenderness Neurology: Grossly no focal neuro deficit.  Data Reviewed: CBC:  Recent Labs Lab  07/20/2015 1120 07/18/15 0530 07/19/15 0340 07/20/15 0120 07/21/15 0824  WBC 25.4* 18.0* 16.9* 24.6* 26.8*  NEUTROABS 23.3* 16.2* 15.2* 22.7* 25.2*  HGB 13.6 11.2* 11.3* 11.4* 12.8  HCT 39.8 33.6* 33.1* 32.8* 36.0  MCV 87.7 88.0 86.6 85.0 83.1  PLT 305 227 255 292 304   Basic Metabolic Panel:  Recent Labs Lab 08/08/2015 1120  07/18/15 0135 07/18/15 0530 07/19/15 0340 07/20/15 0110 07/21/15 0824  NA 126*  < > 130* 129* 132* 133* 134*  K 4.3  < > 4.4 4.2 3.9 3.7 4.1  CL 97*  < > 103 102 107 108 109  CO2 14*  < > 15* 15* 15* 12* 11*  GLUCOSE 166*  < > 121* 157* 140* 181* 128*  BUN 46*  < > 48* 49* 66* 72* 89*  CREATININE 2.64*  < > 2.74* 2.87* 3.44* 3.72* 4.28*  CALCIUM 8.7*  < > 8.3* 7.9* 7.8* 8.3* 8.5*  MG 1.6*  --   --   --   --  2.0  --   < > = values in this interval not  displayed. Liver Function Tests:  Recent Labs Lab 07-20-2015 1120 20-Jul-2015 1448 07/19/15 0340 07/20/15 0110 07/21/15 0824  AST 91* 81* 38 33 48*  ALT 36 34 32 31 40  ALKPHOS 76 67 58 71 81  BILITOT 1.2 0.9 0.8 0.6 0.8  PROT 6.9 6.0* 5.3* 5.7* 5.9*  ALBUMIN 3.5 3.1* 2.3* 2.4* 2.7*   No results for input(s): LIPASE, AMYLASE in the last 168 hours. No results for input(s): AMMONIA in the last 168 hours.  Cardiac Enzymes:  Recent Labs Lab Jul 20, 2015 1447 Jul 20, 2015 2055 07/19/15 1353 07/19/15 1912 07/20/15 0110  TROPONINI 0.73* 1.07* 0.16* 0.15* 0.15*   BNP (last 3 results) No results for input(s): BNP in the last 8760 hours.  ProBNP (last 3 results) No results for input(s): PROBNP in the last 8760 hours.   CBG: No results for input(s): GLUCAP in the last 168 hours.  Recent Results (from the past 240 hour(s))  Blood Culture (routine x 2)     Status: None (Preliminary result)   Collection Time: 07-20-2015 11:10 AM  Result Value Ref Range Status   Specimen Description BLOOD LEFT ANTECUBITAL  Final   Special Requests BOTTLES DRAWN AEROBIC AND ANAEROBIC  Final   Culture   Final    NO  GROWTH 4 DAYS Performed at [facility]    Report Status PENDING  Incomplete  Blood Culture (routine x 2)     Status: None (Preliminary result)   Collection Time: July 20, 2015 11:40 AM  Result Value Ref Range Status   Specimen Description BLOOD RIGHT WRIST  Final   Special Requests IN PEDIATRIC BOTTLE  Final   Culture   Final    NO GROWTH 4 DAYS Performed at [facility]    Report Status PENDING  Incomplete  Urine culture     Status: None   Collection Time: 07/20/15 12:32 PM  Result Value Ref Range Status   Specimen Description URINE, CATHETERIZED  Final   Special Requests NONE  Final   Culture   Final    2,000 COLONIES/mL INSIGNIFICANT GROWTH Performed at [facility]    Report Status 07/18/2015 FINAL  Final  MRSA PCR Screening     Status: None   Collection Time: 07/20/15  7:02 PM  Result Value Ref Range Status   MRSA by PCR NEGATIVE NEGATIVE Final    Comment:        The GeneXpert MRSA Assay (FDA approved for NASAL specimens only), is one component of a comprehensive MRSA colonization surveillance program. It is not intended to diagnose MRSA infection nor to guide or monitor treatment for MRSA infections.      Studies: Dg Swallowing Func-speech Pathology  07/21/2015  Objective Swallowing Evaluation:   Patient Details Name: Nicole Harding MRN: [id number] Date of Birth: 1923/11/11 Today's Date: 07/21/2015 Time: SLP Start Time (ACUTE ONLY): 1000-SLP Stop Time (ACUTE ONLY): 1025 SLP Time Calculation (min) (ACUTE ONLY): 25 min Past Medical History: Past Medical History Diagnosis Date . Hypertension  . History of orthostatic hypotension  . IBS (irritable bowel syndrome)  . Parkinson's disease    ATYPICAL . Dizziness  . Vertigo  . Fatigue  . Advanced age  . HLD (hyperlipidemia)    intolerant to lipid lowering drugs . Normal nuclear stress test 2003 . Fall at home 05/2012   mechanical fall  (06/01/2012) . Pulmonary embolism (HCC) 05/31/2012   off coumadin  due to fall/SDH; has filter in place . Chronic kidney disease, stage 4 (severe)  .  Exertional dyspnea  . Arthritis    "legs, fingers" (06/01/2012) . Subdural hematoma [facility])  Past Surgical History: Past Surgical History Procedure Laterality Date . Small intestine surgery   . Hemorrhoid surgery  1970   "have4 had accidents ever since" (06/01/2012) . Abdominal hysterectomy  1970's . Tonsillectomy  1954 . Cholecystectomy  1955? Mar[facility] Kitchen Breast cyst excision  1970's   left . Cataract extraction w/ intraocular lens  implant, bilateral  ? 1990's . Shoulder open rotator cuff repair  1990?   right . Appendectomy   . Joint replacement   . Eye surgery   HPI: Nicole Harding is a 81 y.o. female with Past medical history of pulmonary hypertension, COPD, essential hypertension, Parkinson's disease, history of pulmonary embolism not on any anticoagulation due to recurrent fall, status post IVC filter, chronic kidney disease stage IV. The patient is presenting with complaints of worsening of her chronic shortness of breath. CXR concerning for aspiration, atelectasis, or developing PNA. Previous BSE completed 06/19/12 recommending refular diet and thin liquids. Subjective: pt alert, minimally conversant Assessment / Plan / Recommendation CHL IP CLINICAL IMPRESSIONS 07/21/2015 Therapy Diagnosis Mild oral phase dysphagia;Moderate pharyngeal phase dysphagia Clinical Impression Pt demonstrates mild oral dysphagia with liquid textures with brief oral holding and poor bolus cohesion for oral transit, contributing to premature spill and delayed swallow. Mastication and transit of solids WNL. Oropharyngeal phase characterized by delayed swallow initiation, mild base of tongue weakness. There is sensed aspiration/penetration before the swallow with thin liquids and cup sips of nectar. Pts position during MBS contribues to penetration as pt is forward leaning with vestibule easily exposed. Best method of feeding is giving teaspoon sips of nectar  to reduce bolus size and eliminate penetration. Pt quite confused and weak during MBS. Function likely to improve if cognition and strength improve.  Dys 3/mechanical soft diet and nectar thick liquids are recommended. (Note that pt often has dry weak coughing that does not appear related to penetration or aspiration. Suspect appearance of mild eospahgeal stasis may contribute to cough) Impact on safety and function Moderate aspiration risk   CHL IP TREATMENT RECOMMENDATION 07/21/2015 Treatment Recommendations Therapy as outlined in treatment plan below   Prognosis 07/21/2015 Prognosis for Safe Diet Advancement Good Barriers to Reach Goals Other (Comment) Barriers/Prognosis Comment -- CHL IP DIET RECOMMENDATION 07/21/2015 SLP Diet Recommendations Dysphagia 3 (Mech soft) solids;Nectar thick liquid Liquid Administration via Spoon Medication Administration Crushed with puree Compensations Slow rate;Small sips/bites Postural Changes Seated upright at 90 degrees   CHL IP OTHER RECOMMENDATIONS 07/21/2015 Recommended Consults -- Oral Care Recommendations Oral care BID Other Recommendations Order thickener from pharmacy   CHL IP FOLLOW UP RECOMMENDATIONS 07/21/2015 Follow up Recommendations Skilled Nursing facility   [facility] IP FREQUENCY AND DURATION 07/21/2015 Speech Therapy Frequency (ACUTE ONLY) min 2x/week Treatment Duration 2 weeks      CHL IP ORAL PHASE 07/21/2015 Oral Phase Impaired Oral - Pudding Teaspoon -- Oral - Pudding Cup -- Oral - Honey Teaspoon -- Oral - Honey Cup -- Oral - Nectar Teaspoon Lingual pumping;Holding of bolus;Delayed oral transit;Decreased bolus cohesion Oral - Nectar Cup Lingual pumping;Holding of bolus;Delayed oral transit;Decreased bolus cohesion Oral - Nectar Straw Lingual pumping;Holding of bolus;Delayed oral transit;Decreased bolus cohesion Oral - Thin Teaspoon -- Oral - Thin Cup Lingual pumping;Holding of bolus;Delayed oral transit;Decreased bolus cohesion Oral - Thin Straw -- Oral - Puree  Lingual pumping;Delayed oral transit Oral - Mech Soft -- Oral - Regular WFL Oral - Multi-Consistency -- Oral - Pill Other (Comment)  Oral Phase - Comment --  CHL IP PHARYNGEAL PHASE 07/21/2015 Pharyngeal Phase Impaired Pharyngeal- Pudding Teaspoon -- Pharyngeal -- Pharyngeal- Pudding Cup -- Pharyngeal -- Pharyngeal- Honey Teaspoon -- Pharyngeal -- Pharyngeal- Honey Cup -- Pharyngeal -- Pharyngeal- Nectar Teaspoon Delayed swallow initiation-vallecula;Pharyngeal residue - valleculae Pharyngeal -- Pharyngeal- Nectar Cup Delayed swallow initiation-pyriform sinuses;Moderate aspiration;Penetration/Aspiration before swallow Pharyngeal Material enters airway, passes BELOW cords and not ejected out despite cough attempt by patient Pharyngeal- Nectar Straw Delayed swallow initiation-vallecula;Penetration/Aspiration before swallow;Pharyngeal residue - valleculae Pharyngeal Material enters airway, CONTACTS cords and then ejected out Pharyngeal- Thin Teaspoon -- Pharyngeal -- Pharyngeal- Thin Cup Delayed swallow initiation-vallecula;Penetration/Aspiration before swallow;Pharyngeal residue - valleculae Pharyngeal Material enters airway, remains ABOVE vocal cords and not ejected out Pharyngeal- Thin Straw -- Pharyngeal -- Pharyngeal- Puree Delayed swallow initiation-vallecula;Pharyngeal residue - valleculae;Reduced tongue base retraction Pharyngeal -- Pharyngeal- Mechanical Soft -- Pharyngeal -- Pharyngeal- Regular Delayed swallow initiation-vallecula;Pharyngeal residue - valleculae;Reduced tongue base retraction Pharyngeal -- Pharyngeal- Multi-consistency -- Pharyngeal -- Pharyngeal- Pill Delayed swallow initiation-vallecula;Pharyngeal residue - valleculae;Reduced tongue base retraction Pharyngeal -- Pharyngeal Comment --  CHL IP CERVICAL ESOPHAGEAL PHASE 07/21/2015 Cervical Esophageal Phase WFL Pudding Teaspoon -- Pudding Cup -- Honey Teaspoon -- Honey Cup -- Nectar Teaspoon -- Nectar Cup -- Nectar Straw -- Thin Teaspoon -- Thin  Cup -- Thin Straw -- Puree -- Mechanical Soft -- Regular -- Multi-consistency -- Pill -- Cervical Esophageal Comment -- Harlon Ditty, MA CCC-SLP [phone] DeBlois, Riley Nearing 07/21/2015, 11:00 AM                Scheduled Meds: . aspirin  81 mg Oral Daily  . famotidine  20 mg Oral Daily  . guaiFENesin  600 mg Oral BID  . heparin  5,000 Units Subcutaneous 3 times per day  . hydrALAZINE  25 mg Oral 4 times per day  . ipratropium-albuterol  3 mL Nebulization TID  . [START ON 07/22/2015] isosorbide mononitrate  60 mg Oral Daily  . metoprolol tartrate  25 mg Oral TID  . piperacillin-tazobactam (ZOSYN)  IV  2.25 g Intravenous Q8H   Continuous Infusions: . sodium chloride 100 mL/hr at 07/21/15 1558   PRN Meds: acetaminophen, fentaNYL (SUBLIMAZE) injection, hydrALAZINE, LORazepam, ondansetron (ZOFRAN) IV, polyethylene glycol, RESOURCE THICKENUP CLEAR  Time spent: 30 minutes  Author: Lynden Oxford, MD [facility] Pager: [phone] 07/21/2015 5:23 PM  If 7PM-7AM, please contact night-coverage at [email or web address], password [facility]

## 2015-07-21 NOTE — Progress Notes (Signed)
MBSS complete. Full report located under chart review in imaging section. Ily Denno, MA CCC-SLP [phone]  

## 2015-07-21 NOTE — Progress Notes (Signed)
CSW met with daughter today and have begun to seek SNF placement in the [place] area where she lives- per daughter, they have now been told her kidneys are failing and that she may not make it out of hospital- daughter plans to drive back from [facility] and be here tomorrow- will plan to f/u in am.  Eduard Clos, MSW, [place]

## 2015-07-21 NOTE — Consult Note (Signed)
Renal Service Consult Note [facility]  Nicole Harding 07/21/2015 Roney Jaffe D Requesting Physician:  Dr Posey Pronto  Reason for Consult: Acute KI on CKD4 HPI: The patient is a 81 y.o. year-old with hx of HTN, parkinsons, COPD/ sever pulm HTN not candidate for Rx, IBS, HL, hx falls/ SDH 2013, and CKD stage IV, baseline creat 2.0.  Patient presented with progressive SOB for 2 days, not responding to pred/ Zpack given by PCP.  Some n/v/d also. LIves in [place].  No abd pain . Admitted w diagnosis of "sepsis", had WBC 25k wleft shift, tachypneic/ tachycardic. Rx with IV vanc/ zosyn. IVF's gently. Creat 2.0 on admission and today up to 4.28.  No IV contrast/ nsaids/ Acei or ARB.   Family says she became confused over the last 24hours.  Also they note she is DNR and they wouldn't want any heroic measures taken in her care.    Date  Creat   eGFR  CKD Feb 2016 1.96  25  Stage IV 07/04/15 2.05  24       "  07/25/2015 2.40  20 07/18/15 2.74   07/19/15 3.44   07/20/15 3.72  07/21/15 4.28   Chart review: Mar 11 - weak/ vol depletion/ diarrhea, HTN, tic dz, CKD, Parkinsons Oct 13 - acute PE by CT angio, IBS, UTI/ CKD 4, accel HTN, fall, rigors, cervical myelopathy Nov 13 - fall with subdural hematoma > anticoag reversed, coumadin stopped indefinitely. No surg indicated. TME. Pseudomonas UTI, hyponatremia 130, HTN, HHD, CKD creat 1.6-2.1 Nov 15 - fainting/ syncope > prob d/t uncont HTN, HTN volatile BP, pulm HTN prob due to COPD/ diast HF.  CT chest confirmed COPD and pulm HTN diagnosis. Poor caniddate for PH treatment, no further w/u needed.     Past Medical History  Past Medical History  Diagnosis Date  . Hypertension   . History of orthostatic hypotension   . IBS (irritable bowel syndrome)   . Parkinson's disease     ATYPICAL  . Dizziness   . Vertigo   . Fatigue   . Advanced age   . HLD (hyperlipidemia)     intolerant to lipid lowering drugs  . Normal nuclear stress  test 2003  . Fall at home 05/2012    mechanical fall  (06/01/2012)  . Pulmonary embolism ([place]) 05/31/2012    off coumadin due to fall/SDH; has filter in place  . Chronic kidney disease, stage 4 (severe)   . Exertional dyspnea   . Arthritis     "legs, fingers" (06/01/2012)  . Subdural hematoma [facility])    Past Surgical History  Past Surgical History  Procedure Laterality Date  . Small intestine surgery    . Hemorrhoid surgery  1970    "have4 had accidents ever since" (06/01/2012)  . Abdominal hysterectomy  1970's  . Tonsillectomy  1954  . Cholecystectomy  1955?  Marland Kitchen Breast cyst excision  1970's    left  . Cataract extraction w/ intraocular lens  implant, bilateral  ? 1990's  . Shoulder open rotator cuff repair  1990?    right  . Appendectomy    . Joint replacement    . Eye surgery     Family History  Family History  Problem Relation Age of Onset  . Stroke Father   . Emphysema Brother     smoked  . Heart disease      siblings   Social History  reports that she quit smoking about 44 years ago. Her smoking  use included Cigarettes. She has a 20 pack-year smoking history. She has never used smokeless tobacco. She reports that she does not drink alcohol or use illicit drugs. Allergies  Allergies  Allergen Reactions  . Amlodipine Nausea And Vomiting and Swelling    Swelling --sick and ended up in hospital N&V  . Codeine Nausea And Vomiting  . Lipitor [Atorvastatin Calcium] Other (See Comments)    "cramps my legs"  . Lisinopril Other (See Comments)    unknown  . Prozac [Fluoxetine Hcl] Nausea And Vomiting    syncope  . Red Yeast Rice Other (See Comments)    unknown  . Zetia [Ezetimibe] Other (See Comments)    unknown  . Zoloft [Sertraline Hcl]     Extreme weakness   Home medications Prior to Admission medications   Medication Sig Start Date End Date Taking? Authorizing Provider  albuterol (PROVENTIL HFA;VENTOLIN HFA) 108 (90 BASE) MCG/ACT inhaler Inhale 2 puffs into  the lungs every 4 (four) hours as needed for wheezing or shortness of breath.   Yes Historical Provider, MD  aspirin 81 MG tablet Take 81 mg by mouth daily.   Yes Historical Provider, MD  citric acid-sodium citrate (ORACIT) 334-500 MG/5ML solution Take 30 mLs by mouth once. Patient taking differently: Take 30 mLs by mouth daily.  09/02/14  Yes Tanda Rockers, MD  famotidine (PEPCID) 20 MG tablet Take 20 mg by mouth at bedtime.   Yes Historical Provider, MD  feeding supplement, ENSURE COMPLETE, (ENSURE COMPLETE) LIQD Take 237 mLs by mouth 2 (two) times daily between meals. 06/25/14  Yes Velna Hatchet, MD  GuaiFENesin (MUCUS RELIEF ADULT PO) Take 1 tablet by mouth every 4 (four) hours as needed (congestion).   Yes Historical Provider, MD  hydrALAZINE (APRESOLINE) 25 MG tablet Take 25 mg by mouth 4 (four) times daily.   Yes Historical Provider, MD  isosorbide mononitrate (IMDUR) 60 MG 24 hr tablet Take 1 tablet (60 mg total) by mouth daily. 06/25/14  Yes Velna Hatchet, MD  metoprolol succinate (TOPROL-XL) 25 MG 24 hr tablet Take 25 mg by mouth 2 (two) times daily. And  If blood pressure exceeds 150, take additional tablet   Yes Historical Provider, MD  Multiple Vitamins-Minerals (HAIR/SKIN/NAILS PO) Take by mouth.   Yes Historical Provider, MD  multivitamin-iron-minerals-folic acid (CENTRUM) chewable tablet Chew 1 tablet by mouth daily.   Yes Historical Provider, MD  pantoprazole (PROTONIX) 40 MG tablet TAKE ONE TABLET 30 TO 60 MINUTES BEFORE FIRST MEAL OF THE DAY 07/14/14  Yes Tanda Rockers, MD  cephALEXin (KEFLEX) 500 MG capsule Take 1 capsule (500 mg total) by mouth 4 (four) times daily. Patient not taking: Reported on 08/06/2015 07/04/15   Kathryne Hitch   Liver Function Tests  Recent Labs Lab 07/19/15 0340 07/20/15 0110 07/21/15 0824  AST 38 33 48*  ALT 32 31 40  ALKPHOS 58 71 81  BILITOT 0.8 0.6 0.8  PROT 5.3* 5.7* 5.9*  ALBUMIN 2.3* 2.4* 2.7*   No results for input(s): LIPASE,  AMYLASE in the last 168 hours. CBC  Recent Labs Lab 07/19/15 0340 07/20/15 0120 07/21/15 0824  WBC 16.9* 24.6* 26.8*  NEUTROABS 15.2* 22.7* 25.2*  HGB 11.3* 11.4* 12.8  HCT 33.1* 32.8* 36.0  MCV 86.6 85.0 83.1  PLT 255 292 915   Basic Metabolic Panel  Recent Labs Lab 08/05/2015 1448 07/25/2015 2055 07/18/15 0135 07/18/15 0530 07/19/15 0340 07/20/15 0110 07/21/15 0824  NA 128* 129* 130* 129* 132* 133* 134*  K 3.8 4.4 4.4 4.2 3.9 3.7 4.1  CL 101 103 103 102 107 108 109  CO2 14* 14* 15* 15* 15* 12* 11*  GLUCOSE 127* 110* 121* 157* 140* 181* 128*  BUN 43* 46* 48* 49* 66* 72* 89*  CREATININE 2.40* 2.56* 2.74* 2.87* 3.44* 3.72* 4.28*  CALCIUM 7.9* 8.4* 8.3* 7.9* 7.8* 8.3* 8.5*    Filed Vitals:   07/21/15 0453 07/21/15 0520 07/21/15 0741 07/21/15 0904  BP: 150/76   172/100  Pulse: 90   97  Temp: 97.6 F (36.4 C)     TempSrc: Axillary     Resp: 18     Height:      Weight:  70.4 kg (155 lb 3.3 oz)    SpO2: 96%  92%    Exam Elderly female, moaning, uncomfortable, frail No rash, cyanosis or gangrene Sclera anicteric, throat clear JVP about 10-12 cm Chest clear to bases bilat RRR no sig MRG Abd obese, +bilat flank/ abd wall edema, no hsm or ascites noted Bilat thigh edema, trace pretib edema Foley in place MS no wounds/ ulcers/ gangrene Neuro awake but lethargic, moaning, not intelligible, does not follow commands  12/9 UA 6-30 wbc, 0-5 rbc, 1.25mo LE CXR 12/9 +R sided patchy infiltrates c/w PNA  Assessment: 1 Acute renal failure on CKD4 in setting of PNA/ sepsis , transient hypotension.  Vol is up and making urine but creat rising daily and pt now uremic and confused.  Poor outlook if she doesn't turn around in the next 48 hours. Nothing really to offer.  Have d/w family, if there is further clinical deterioration they would want to make sure she is comfortable.  Not a dialysis candidate. 2 CKD stage 4, baseline creat 2.0 3 Parkinson's 4 HTN 5 COPD / sever PAH  not candidate for Rx last admission 6 Chron debility   Plan - supportive care. If doesn't improve or gets worse in next 24-48 hours , comfort care will be appropriate and supported by family.  Poor outlook.   RKelly SplinterMD CNewell Rubbermaidpager [phone]   cell [phone]/13/2016, 2:16 PM

## 2015-07-22 DIAGNOSIS — Z515 Encounter for palliative care: Secondary | ICD-10-CM

## 2015-07-22 DIAGNOSIS — G9340 Encephalopathy, unspecified: Secondary | ICD-10-CM | POA: Diagnosis present

## 2015-07-22 DIAGNOSIS — Z66 Do not resuscitate: Secondary | ICD-10-CM | POA: Diagnosis present

## 2015-07-22 LAB — CBC WITH DIFFERENTIAL/PLATELET
BAND NEUTROPHILS: 0 %
BASOS ABS: 0 10*3/uL (ref 0.0–0.1)
BASOS PCT: 0 %
BLASTS: 0 %
EOS ABS: 0 10*3/uL (ref 0.0–0.7)
Eosinophils Relative: 0 %
HEMATOCRIT: 33.7 % — AB (ref 36.0–46.0)
HEMOGLOBIN: 11.8 g/dL — AB (ref 12.0–15.0)
Lymphocytes Relative: 2 %
Lymphs Abs: 0.5 10*3/uL — ABNORMAL LOW (ref 0.7–4.0)
MCH: 29.5 pg (ref 26.0–34.0)
MCHC: 35 g/dL (ref 30.0–36.0)
MCV: 84.3 fL (ref 78.0–100.0)
METAMYELOCYTES PCT: 0 %
MONO ABS: 1.3 10*3/uL — AB (ref 0.1–1.0)
Monocytes Relative: 5 %
Myelocytes: 1 %
NEUTROS ABS: 24.1 10*3/uL — AB (ref 1.7–7.7)
Neutrophils Relative %: 92 %
OTHER: 0 %
PROMYELOCYTES ABS: 0 %
Platelets: 225 10*3/uL (ref 150–400)
RBC: 4 MIL/uL (ref 3.87–5.11)
RDW: 15 % (ref 11.5–15.5)
WBC: 25.9 10*3/uL — ABNORMAL HIGH (ref 4.0–10.5)
nRBC: 0 /100 WBC

## 2015-07-22 LAB — COMPREHENSIVE METABOLIC PANEL
ALBUMIN: 2.3 g/dL — AB (ref 3.5–5.0)
ALK PHOS: 79 U/L (ref 38–126)
ALT: 36 U/L (ref 14–54)
AST: 26 U/L (ref 15–41)
Anion gap: 15 (ref 5–15)
BILIRUBIN TOTAL: 0.8 mg/dL (ref 0.3–1.2)
BUN: 104 mg/dL — AB (ref 6–20)
CALCIUM: 8.3 mg/dL — AB (ref 8.9–10.3)
CO2: 13 mmol/L — ABNORMAL LOW (ref 22–32)
Chloride: 114 mmol/L — ABNORMAL HIGH (ref 101–111)
Creatinine, Ser: 4.4 mg/dL — ABNORMAL HIGH (ref 0.44–1.00)
GFR calc Af Amer: 9 mL/min — ABNORMAL LOW (ref >60)
GFR calc non Af Amer: 8 mL/min — ABNORMAL LOW (ref >60)
GLUCOSE: 119 mg/dL — AB (ref 65–99)
POTASSIUM: 4 mmol/L (ref 3.5–5.1)
Sodium: 142 mmol/L (ref 135–145)
TOTAL PROTEIN: 5.8 g/dL — AB (ref 6.5–8.1)

## 2015-07-22 LAB — CULTURE, BLOOD (ROUTINE X 2)
CULTURE: NO GROWTH
Culture: NO GROWTH

## 2015-07-22 MED ORDER — LORAZEPAM 2 MG/ML IJ SOLN
0.5000 mg | INTRAMUSCULAR | Status: DC | PRN
  Administered 2015-07-22: 0.5 mg via INTRAVENOUS
  Filled 2015-07-22: qty 1

## 2015-07-22 MED ORDER — FENTANYL CITRATE (PF) 100 MCG/2ML IJ SOLN
25.0000 ug | INTRAMUSCULAR | Status: DC | PRN
Start: 2015-07-22 — End: 2015-07-24

## 2015-07-22 MED ORDER — GLYCOPYRROLATE 0.2 MG/ML IJ SOLN
0.2000 mg | INTRAMUSCULAR | Status: DC | PRN
  Filled 2015-07-22: qty 1

## 2015-07-22 NOTE — Progress Notes (Signed)
Speech Language Pathology Treatment: Dysphagia  Patient Details Name: Nicole BridgemanDorothy M Holst MRN: [id number] DOB: 01-21-1924 Today's Date: 07/22/2015 Time: [id number] SLP Time Calculation (min) (ACUTE ONLY): 23 min  Assessment / Plan / Recommendation Clinical Impression  F/u for dysphagia treatment after MBS.  Pt not responding as well to family/nursing today - unable to take meds.  Family (three children) present - they had questions about oral care.  This clinician set up oral suctioning equipment and provided oral care, demonstrating to family how to complete it.  Pt maintained eyes closed throughout session, but responded to yes/no questions with head nod/shake and followed commands to extend tongue, seal lips.  Provided with limited sips of nectar-thick liquid, requiring mod cues to receive POs, but demonstrated no overt s/s of aspiration.  Instructed family that they may offer occasional ice chips (2-3) after oral care if pt desires.  At this time, she is not sufficiently attentive to eat functionally. SLP will continue to follow for toleration, education.  Hold meal tray if not alert.     HPI HPI: Nicole BridgemanDorothy M Horton is a 81 y.o. female with Past medical history of pulmonary hypertension, COPD, essential hypertension, Parkinson's disease, history of pulmonary embolism not on any anticoagulation due to recurrent fall, status post IVC filter, chronic kidney disease stage IV. The patient is presenting with complaints of worsening of her chronic shortness of breath. CXR concerning for aspiration, atelectasis, or developing PNA. Previous BSE completed 06/19/12 recommending refular diet and thin liquids.      SLP Plan  Continue with current plan of care     Recommendations  Diet recommendations: Dysphagia 3 (mechanical soft);Nectar-thick liquid (when alert) Liquids provided via: Teaspoon Medication Administration: Crushed with puree Supervision: Full supervision/cueing for compensatory  strategies;Staff to assist with self feeding Compensations: Slow rate;Small sips/bites Postural Changes and/or Swallow Maneuvers: Seated upright 90 degrees              Oral Care Recommendations: Oral care QID Plan: Continue with current plan of care   Blenda MountsCouture, Jalani Cullifer Laurice 07/22/2015, 12:02 PM  Kizer Nobbe L. Samson Fredericouture, KentuckyMA CCC/SLP Pager [phone]

## 2015-07-22 NOTE — Progress Notes (Signed)
[facility] Progress Note    Patient: Nicole Harding   ZOX:[id number]  DOB: 05/29/1924     DOA: 08/07/2015 Date of Service: the patient was seen and examined on 07/22/2015  Subjective: Patient is drowsy and more lethargic. Not following command. Nutrition: Tolerated oral yesterday Activity: Mostly bedridden Last BM: 07/20/2015  Assessment and Plan: 1. Sepsis (HCC)  No sputum specimen, blood culture negative urine culture insignificant growth . The patient has persistent leukocytosis with worsening renal function as well as worsening mentation. She is DO NOT RESUSCITATE and DO NOT INTUBATE. And family prefer to focus on comfort given appropriate doses with uremia. We will consult palliative care for further assistance. Patient will be placed on full comfort protocol. Prognosis at present is poor given significant worsening of her renal function.  2. Acute on chronic kidney disease Renal function continues to worsen, ultrasound renal is negative for any obstruction, continue Foley catheter due to urinary retention as well as comfort Most likely ATN in the setting of sepsis. appreciate input from nephrology With worsening of renal function and her being a poor candidate for hemodialysis is appropriate to consider comfort measures for the patient given her worsening mentation  3. Atrial fibrillation with RVR CHA2DS2-VASc Score 7 Not on any anticoagulation due to prior history of subdural hematoma with multiple fall.  5. Essential hypertension Focus on comfort.    6. Dysphagia Acute encephalopathy CT head unremarkable, most likely delirium secondary to sepsis Speech therapy consulted with further recommendation  7. Diarrhea  No BM here in the hospital  DVT Prophylaxis comfort care. Nutrition: as tolerated for comfort  Advance goals of care discussion: DNR/DNI   Brief Summary of Hospitalization:  HPI: As per the H and P dictated on admission, "Nicole Harding  is a 81 y.o. female with Past medical history of pulmonary hypertension, COPD, essential hypertension, Parkinson's disease, history of pulmonary embolism not on any anticoagulation due to recurrent fall, status post IVC filter, chronic kidney disease stage IV. The patient is presenting with complaints of worsening of her chronic shortness of breath. The patient's chronic pulmonary hypertension as well as COPD because of which she has chronic shortness of breath. For last 2 days her symptoms have been progressively worsening. She was seen by her PCP and she was started on azithromycin as well as prednisone 20 mg as an outpatient. Despite this she continues to have further worsening of shortness of breath. She also had episodes of diarrhea 2, lose watery with foul-smelling yesterday. She had one episode of vomiting earlier in the morning as well. No episodes of aspiration reported by family. She is a resident at assisted living facility. She denies any complaints of abdominal pain nausea or vomiting at the time of my evaluation. She does complains of chest congestion and shortness of breath. She denies any dizziness or lightheadedness. At her baseline the patient is able to carry out a conversation and is able to identify the family member. She generally walks with a walker." Daily update: 07/18/2015, CT scan of the head and negative, speech therapy recommends dysphagia type I diet. 07/19/2015 ultrasound renal does not show any obstruction and shows chronic medical disease. 07/20/2015 transition out of the ICU. 07/21/2015 NEPHROLOGY CONSULTED   Consultants: nephrology, palliative care Procedure: Echocardiogram ejection fraction 60-65%.  Antibiotics: Anti-infectives    Start     Dose/Rate Route Frequency Ordered Stop   07/19/15 2000  piperacillin-tazobactam (ZOSYN) IVPB 2.25 g  Status:  Discontinued     2.25  g 100 mL/hr over 30 Minutes Intravenous Every 8 hours 07/19/15 1245 07/22/15 1244    07/19/15 1800  vancomycin (VANCOCIN) IVPB 1000 mg/200 mL premix  Status:  Discontinued     1,000 mg 200 mL/hr over 60 Minutes Intravenous Every 48 hours 07/19/15 1256 07/20/15 1404   07/19/15 1300  vancomycin (VANCOCIN) IVPB 1000 mg/200 mL premix  Status:  Discontinued     1,000 mg 200 mL/hr over 60 Minutes Intravenous Every 48 hours 10/21/2014 1310 07/19/15 1244   10/21/2014 1800  piperacillin-tazobactam (ZOSYN) IVPB 2.25 g  Status:  Discontinued     2.25 g 100 mL/hr over 30 Minutes Intravenous 4 times per day 10/21/2014 1310 07/19/15 1245   10/21/2014 1315  vancomycin (VANCOCIN) 500 mg in sodium chloride 0.9 % 100 mL IVPB     500 mg 100 mL/hr over 60 Minutes Intravenous STAT 10/21/2014 1310 10/21/2014 1420   10/21/2014 1115  vancomycin (VANCOCIN) IVPB 1000 mg/200 mL premix     1,000 mg 200 mL/hr over 60 Minutes Intravenous  Once 10/21/2014 1107 10/21/2014 1243   10/21/2014 1115  piperacillin-tazobactam (ZOSYN) IVPB 3.375 g     3.375 g 100 mL/hr over 30 Minutes Intravenous STAT 10/21/2014 1112 10/21/2014 1157      Family Communication: family was present at bedside, at the time of interview.  Opportunity was given to ask question and all questions were answered satisfactorily.   Disposition:  comfort care    Intake/Output Summary (Last 24 hours) at 07/22/15 1600 Last data filed at 07/22/15 1433  Gross per 24 hour  Intake    820 ml  Output    875 ml  Net    -55 ml   Filed Weights   07/19/15 0557 07/20/15 0415 07/21/15 0520  Weight: 71.1 kg (156 lb 12 oz) 75 kg (165 lb 5.5 oz) 70.4 kg (155 lb 3.3 oz)    Objective: Physical Exam: Filed Vitals:   07/22/15 0458 07/22/15 0955 07/22/15 1432 07/22/15 1529  BP: 152/70  168/80   Pulse: 86  93   Temp: 97.8 F (36.6 C)  97.7 F (36.5 C)   TempSrc: Axillary  Axillary   Resp: 20  17   Height:      Weight:      SpO2: 96% 96% 94% 98%     General: Appear in mild distress, no Rash; Oral Mucosa moist. Cardiovascular: S1 and S2 Present, no Murmur, no  JVD Respiratory: Bilateral Air entry present and bilateral  Crackles, no wheezes Abdomen: Bowel Sound present, Soft Extremities: no Pedal edema,  Neurology: Lethargic   Data Reviewed: CBC:  Recent Labs Lab 07/18/15 0530 07/19/15 0340 07/20/15 0120 07/21/15 0824 07/22/15 0745  WBC 18.0* 16.9* 24.6* 26.8* 25.9*  NEUTROABS 16.2* 15.2* 22.7* 25.2* 24.1*  HGB 11.2* 11.3* 11.4* 12.8 11.8*  HCT 33.6* 33.1* 32.8* 36.0 33.7*  MCV 88.0 86.6 85.0 83.1 84.3  PLT 227 255 292 304 225   Basic Metabolic Panel:  Recent Labs Lab 10/21/2014 1120  07/18/15 0530 07/19/15 0340 07/20/15 0110 07/21/15 0824 07/22/15 0745  NA 126*  < > 129* 132* 133* 134* 142  K 4.3  < > 4.2 3.9 3.7 4.1 4.0  CL 97*  < > 102 107 108 109 114*  CO2 14*  < > 15* 15* 12* 11* 13*  GLUCOSE 166*  < > 157* 140* 181* 128* 119*  BUN 46*  < > 49* 66* 72* 89* 104*  CREATININE 2.64*  < > 2.87* 3.44* 3.72* 4.28* 4.40*  CALCIUM 8.7*  < > 7.9* 7.8* 8.3* 8.5* 8.3*  MG 1.6*  --   --   --  2.0  --   --   < > = values in this interval not displayed. Liver Function Tests:  Recent Labs Lab 2015-08-15 1448 07/19/15 0340 07/20/15 0110 07/21/15 0824 07/22/15 0745  AST 81* 38 33 48* 26  ALT 34 32 31 40 36  ALKPHOS 67 58 71 81 79  BILITOT 0.9 0.8 0.6 0.8 0.8  PROT 6.0* 5.3* 5.7* 5.9* 5.8*  ALBUMIN 3.1* 2.3* 2.4* 2.7* 2.3*   No results for input(s): LIPASE, AMYLASE in the last 168 hours. No results for input(s): AMMONIA in the last 168 hours.  Cardiac Enzymes:  Recent Labs Lab 15-Aug-2015 1447 08-15-2015 2055 07/19/15 1353 07/19/15 1912 07/20/15 0110  TROPONINI 0.73* 1.07* 0.16* 0.15* 0.15*   BNP (last 3 results) No results for input(s): BNP in the last 8760 hours.  ProBNP (last 3 results) No results for input(s): PROBNP in the last 8760 hours.   CBG: No results for input(s): GLUCAP in the last 168 hours.  Recent Results (from the past 240 hour(s))  Blood Culture (routine x 2)     Status: None   Collection Time:  2015/08/15 11:10 AM  Result Value Ref Range Status   Specimen Description BLOOD LEFT ANTECUBITAL  Final   Special Requests BOTTLES DRAWN AEROBIC AND ANAEROBIC  Final   Culture   Final    NO GROWTH 5 DAYS Performed at [facility]    Report Status 07/22/2015 FINAL  Final  Blood Culture (routine x 2)     Status: None   Collection Time: August 15, 2015 11:40 AM  Result Value Ref Range Status   Specimen Description BLOOD RIGHT WRIST  Final   Special Requests IN PEDIATRIC BOTTLE  Final   Culture   Final    NO GROWTH 5 DAYS Performed at [facility]    Report Status 07/22/2015 FINAL  Final  Urine culture     Status: None   Collection Time: 08/15/2015 12:32 PM  Result Value Ref Range Status   Specimen Description URINE, CATHETERIZED  Final   Special Requests NONE  Final   Culture   Final    2,000 COLONIES/mL INSIGNIFICANT GROWTH Performed at [facility]    Report Status 07/18/2015 FINAL  Final  MRSA PCR Screening     Status: None   Collection Time: 08-15-2015  7:02 PM  Result Value Ref Range Status   MRSA by PCR NEGATIVE NEGATIVE Final    Comment:        The GeneXpert MRSA Assay (FDA approved for NASAL specimens only), is one component of a comprehensive MRSA colonization surveillance program. It is not intended to diagnose MRSA infection nor to guide or monitor treatment for MRSA infections.      Studies: No results found.   Scheduled Meds: . ipratropium-albuterol  3 mL Nebulization TID   Continuous Infusions:  PRN Meds: acetaminophen, fentaNYL (SUBLIMAZE) injection, glycopyrrolate, LORazepam, ondansetron (ZOFRAN) IV, RESOURCE THICKENUP CLEAR  Time spent: 30 minutes  Author: Lynden Oxford, MD [facility] Pager: [phone] 07/22/2015 4:00 PM  If 7PM-7AM, please contact night-coverage at [email or web address], password [facility]

## 2015-07-22 NOTE — Consult Note (Signed)
Consultation Note Date: 07/22/2015   Patient Name: Nicole Harding  DOB: 07/25/1924  MRN: [id number]  Age / Sex: 81 y.o., female  PCP: Velna Hatchet, MD Referring Physician: Lavina Hamman, MD  Reason for Consultation: Establishing goals of care    Clinical Assessment/Narrative: I met today with Nicole Harding (minimally responsive and seems to be resting comfortably at this time). Her daughter along with ex-husband and his wife are at bedside and supporting each other. Granddaughter is on the way and grandson is on flight from [place] and scheduled to arrive this evening. They confirm the goal for comfort. They say she has been comfortable except for when they checked her blood pressure and anytime they move her she cries out in pain - would not recommend moving her. She seems much progressed from family and RN reports and seems she was very restless and agitated yesterday but not today - likely end of life agitation. We discussed symptom management and anticipating progression at end of life. I am hopeful she will not have many symptoms and be able to achieve comfort for her duration of life. Emotional support provided to family. They are very grateful and appreciative of all the care she has received since she has been hospitalized. Daughter feels that all efforts were exhausted and is pleased that everything was done to try and improve her mother's condition and is now prepared and accepting that her mother is dying. They shared some memories about Ms. Mahr with me. I will follow for support and symptom management.   Contacts/Participants in Discussion: Primary Decision Maker: Daughter    SUMMARY OF RECOMMENDATIONS- - Full comfort care - Awaiting family from out of town - Anticipate hospital death  Code Status/Advance Care Planning: DNR    Code Status Orders        Start     Ordered   07/15/2015 1340   Do not attempt resuscitation (DNR)   Continuous    Question Answer Comment  In the event of cardiac or respiratory ARREST Do not call a "code blue"   In the event of cardiac or respiratory ARREST Do not perform Intubation, CPR, defibrillation or ACLS   In the event of cardiac or respiratory ARREST Use medication by any route, position, wound care, and other measures to relive pain and suffering. May use oxygen, suction and manual treatment of airway obstruction as needed for comfort.      08/01/2015 1342        Symptom Management:   Pain/dyspnea: Fentanyl 12.5-50 mcg every hour prn.   Anxiety: Lorazepam 0.5 mg every 4 hours prn.  Secretions: Robunul 0.2 mg every 4 hours prn.   Palliative Prophylaxis:   Delirium Protocol, Frequent Pain Assessment and Oral Care   Psycho-social/Spiritual:  Support System: Strong Desire for further Chaplaincy support: no Additional Recommendations: Caregiving  Support/Resources and Grief/Bereavement Support  Prognosis: Hours - Days  Discharge Planning: Anticipated Hospital Death   Chief Complaint/ Primary Diagnoses: Present on Admission:  . Sepsis ([place]) . History of pulmonary embolism . GERD (gastroesophageal reflux disease) . Hyponatremia . Acute-on-chronic kidney injury ([place]) . Elevated troponin . Hypomagnesemia . Pulmonary hypertension ([place]) . Lactic acidosis . Diarrhea . Urinary retention . Paroxysmal atrial fibrillation with rapid ventricular response ([place]), CHA2DS2-VASc Score 7 . DNR (do not resuscitate)  I have reviewed the medical record, interviewed the patient and family, and examined the patient. The following aspects are pertinent.  Past Medical History  Diagnosis Date  . Hypertension   .  History of orthostatic hypotension   . IBS (irritable bowel syndrome)   . Parkinson's disease     ATYPICAL  . Dizziness   . Vertigo   . Fatigue   . Advanced age   . HLD (hyperlipidemia)     intolerant to lipid lowering drugs  .  Normal nuclear stress test 2003  . Fall at home 05/2012    mechanical fall  (06/01/2012)  . Pulmonary embolism ([place]) 05/31/2012    off coumadin due to fall/SDH; has filter in place  . Chronic kidney disease, stage 4 (severe)   . Exertional dyspnea   . Arthritis     "legs, fingers" (06/01/2012)  . Subdural hematoma [facility])    Social History   Social History  . Marital Status: Widowed    Spouse Name: N/A  . Number of Children: N/A  . Years of Education: N/A   Occupational History  . Retired    Social History Main Topics  . Smoking status: Former Smoker -- 1.00 packs/day for 20 years    Types: Cigarettes    Quit date: 02/14/1971  . Smokeless tobacco: Never Used  . Alcohol Use: No  . Drug Use: No  . Sexual Activity: No   Other Topics Concern  . None   Social History Narrative   Family History  Problem Relation Age of Onset  . Stroke Father   . Emphysema Brother     smoked  . Heart disease      siblings   Scheduled Meds: . aspirin  81 mg Oral Daily  . famotidine  20 mg Oral Daily  . guaiFENesin  600 mg Oral BID  . hydrALAZINE  25 mg Oral 4 times per day  . ipratropium-albuterol  3 mL Nebulization TID  . isosorbide mononitrate  60 mg Oral Daily  . metoprolol tartrate  25 mg Oral TID   Continuous Infusions: . sodium chloride 40 mL/hr at 07/22/15 1248   PRN Meds:.acetaminophen, fentaNYL (SUBLIMAZE) injection, LORazepam, LORazepam, ondansetron (ZOFRAN) IV, polyethylene glycol, RESOURCE THICKENUP CLEAR Medications Prior to Admission:  Prior to Admission medications   Medication Sig Start Date End Date Taking? Authorizing Provider  albuterol (PROVENTIL HFA;VENTOLIN HFA) 108 (90 BASE) MCG/ACT inhaler Inhale 2 puffs into the lungs every 4 (four) hours as needed for wheezing or shortness of breath.   Yes Historical Provider, MD  aspirin 81 MG tablet Take 81 mg by mouth daily.   Yes Historical Provider, MD  citric acid-sodium citrate (ORACIT) 334-500 MG/5ML solution Take  30 mLs by mouth once. Patient taking differently: Take 30 mLs by mouth daily.  09/02/14  Yes Tanda Rockers, MD  famotidine (PEPCID) 20 MG tablet Take 20 mg by mouth at bedtime.   Yes Historical Provider, MD  feeding supplement, ENSURE COMPLETE, (ENSURE COMPLETE) LIQD Take 237 mLs by mouth 2 (two) times daily between meals. 06/25/14  Yes Velna Hatchet, MD  GuaiFENesin (MUCUS RELIEF ADULT PO) Take 1 tablet by mouth every 4 (four) hours as needed (congestion).   Yes Historical Provider, MD  hydrALAZINE (APRESOLINE) 25 MG tablet Take 25 mg by mouth 4 (four) times daily.   Yes Historical Provider, MD  isosorbide mononitrate (IMDUR) 60 MG 24 hr tablet Take 1 tablet (60 mg total) by mouth daily. 06/25/14  Yes Velna Hatchet, MD  metoprolol succinate (TOPROL-XL) 25 MG 24 hr tablet Take 25 mg by mouth 2 (two) times daily. And  If blood pressure exceeds 150, take additional tablet   Yes Historical Provider, MD  Multiple Vitamins-Minerals (HAIR/SKIN/NAILS PO) Take by mouth.   Yes Historical Provider, MD  multivitamin-iron-minerals-folic acid (CENTRUM) chewable tablet Chew 1 tablet by mouth daily.   Yes Historical Provider, MD  pantoprazole (PROTONIX) 40 MG tablet TAKE ONE TABLET 30 TO 60 MINUTES BEFORE FIRST MEAL OF THE DAY 07/14/14  Yes Tanda Rockers, MD  cephALEXin (KEFLEX) 500 MG capsule Take 1 capsule (500 mg total) by mouth 4 (four) times daily. Patient not taking: Reported on 07/14/2015 07/04/15   Larene Pickett, PA-C   Allergies  Allergen Reactions  . Amlodipine Nausea And Vomiting and Swelling    Swelling --sick and ended up in hospital N&V  . Codeine Nausea And Vomiting  . Lipitor [Atorvastatin Calcium] Other (See Comments)    "cramps my legs"  . Lisinopril Other (See Comments)    unknown  . Prozac [Fluoxetine Hcl] Nausea And Vomiting    syncope  . Red Yeast Rice Other (See Comments)    unknown  . Zetia [Ezetimibe] Other (See Comments)    unknown  . Zoloft [Sertraline Hcl]     Extreme  weakness    Review of Systems  Unable to perform ROS   Physical Exam  Constitutional: She is oriented to person, place, and time. She appears well-developed. She appears cachectic.  HENT:  Head: Normocephalic.  Cardiovascular: Normal rate.   Respiratory: Effort normal. No accessory muscle usage. No tachypnea. No respiratory distress.  Non-productive congested cough.   GI: Normal appearance.  Neurological: She is alert and oriented to person, place, and time.    Vital Signs: BP 168/80 mmHg  Pulse 93  Temp(Src) 97.7 F (36.5 C) (Axillary)  Resp 17  Ht _0  (1.702 m)  Wt 70.4 kg (155 lb 3.3 oz)  BMI 24.30 kg/m2  SpO2 94%  SpO2: SpO2: 94 % O2 Device:SpO2: 94 % O2 Flow Rate: .O2 Flow Rate (L/min): 3 L/min  IO: Intake/output summary:  Intake/Output Summary (Last 24 hours) at 07/22/15 1522 Last data filed at 07/22/15 1433  Gross per 24 hour  Intake    820 ml  Output    875 ml  Net    -55 ml    LBM: Last BM Date: 07/18/2015 Baseline Weight: Weight: 64.864 kg (143 lb) Most recent weight: Weight: 70.4 kg (155 lb 3.3 oz)      Palliative Assessment/Data:  Flowsheet Rows        Most Recent Value   Intake Tab    Referral Department  Hospitalist   Unit at Time of Referral  Cardiac/Telemetry Unit   Palliative Care Primary Diagnosis  Cardiac   Date Notified  07/22/15   Palliative Care Type  New Palliative care   Reason for referral  Clarify Goals of Care   Date of Admission  07/20/2015   # of days IP prior to Palliative referral  5   Clinical Assessment    Psychosocial & Spiritual Assessment    Palliative Care Outcomes       Additional Data Reviewed:  CBC:    Component Value Date/Time   WBC 25.9* 07/22/2015 0745   WBC 12.1* 11/21/2013 1547   HGB 11.8* 07/22/2015 0745   HGB 13.1 11/21/2013 1547   HCT 33.7* 07/22/2015 0745   HCT 41.1 11/21/2013 1547   PLT 225 07/22/2015 0745   MCV 84.3 07/22/2015 0745   MCV 88.9 11/21/2013 1547   NEUTROABS 24.1* 07/22/2015 0745     LYMPHSABS 0.5* 07/22/2015 0745   MONOABS 1.3* 07/22/2015 0745   EOSABS 0.0  07/22/2015 0745   BASOSABS 0.0 07/22/2015 0745   Comprehensive Metabolic Panel:    Component Value Date/Time   NA 142 07/22/2015 0745   K 4.0 07/22/2015 0745   CL 114* 07/22/2015 0745   CO2 13* 07/22/2015 0745   BUN 104* 07/22/2015 0745   CREATININE 4.40* 07/22/2015 0745   GLUCOSE 119* 07/22/2015 0745   CALCIUM 8.3* 07/22/2015 0745   AST 26 07/22/2015 0745   ALT 36 07/22/2015 0745   ALKPHOS 79 07/22/2015 0745   BILITOT 0.8 07/22/2015 0745   PROT 5.8* 07/22/2015 0745   ALBUMIN 2.3* 07/22/2015 0745     Time In: 1435 Time Out: 1525 Time Total: 84mn Greater than 50%  of this time was spent counseling and coordinating care related to the above assessment and plan.  Signed by: PPershing Proud NP  APershing Proud NP  109/[id number]/8366 3:22 PM  Please contact Palliative Medicine Team phone at [phone] questions and concerns.

## 2015-07-22 NOTE — Progress Notes (Signed)
Pharmacy Antibiotic Follow-up Note  Nicole Harding is a 81 y.o. year-old female admitted on 08/05/2015.  The patient is currently on day #6 of zosyn for pneumonia.  Assessment/Plan:  Zosyn 2.25gm IV q8h appropriate for renal function  Suggest d/c zosyn after 12/15 doses to complete 7-days of therapy  Possible transition to comfort care if no improvement in 1 - 2 days  Temp (24hrs), Avg:97.7 F (36.5 C), Min:97.4 F (36.3 C), Max:98 F (36.7 C)   Recent Labs Lab 07/18/15 0530 07/19/15 0340 07/20/15 0120 07/21/15 0824 07/22/15 0745  WBC 18.0* 16.9* 24.6* 26.8* 25.9*    Recent Labs Lab 07/18/15 0530 07/19/15 0340 07/20/15 0110 07/21/15 0824 07/22/15 0745  CREATININE 2.87* 3.44* 3.72* 4.28* 4.40*   Estimated Creatinine Clearance: 8.4 mL/min (by C-G formula based on Cr of 4.4).    Allergies  Allergen Reactions  . Amlodipine Nausea And Vomiting and Swelling    Swelling --sick and ended up in hospital N&V  . Codeine Nausea And Vomiting  . Lipitor [Atorvastatin Calcium] Other (See Comments)    "cramps my legs"  . Lisinopril Other (See Comments)    unknown  . Prozac [Fluoxetine Hcl] Nausea And Vomiting    syncope  . Red Yeast Rice Other (See Comments)    unknown  . Zetia [Ezetimibe] Other (See Comments)    unknown  . Zoloft [Sertraline Hcl]     Extreme weakness    12/9 >> Vancomycin >> 12/12 12/9 >> Zosyn >>    12/9 blood x 2: ngtd 12/9 urine: insignificant growth MRSA PCR: neg  Thank you for allowing pharmacy to be a part of this patient's care.  Juliette Alcideustin Zeigler, PharmD, BCPS.   Pager: [phone] 07/22/2015 9:14 AM

## 2015-07-22 NOTE — Progress Notes (Addendum)
  [facility] Progress Note   Subjective: much less responsive today, eyes closed and not responding much to voice  Filed Vitals:   07/21/15 2303 07/22/15 0005 07/22/15 0458 07/22/15 0955  BP: 196/89 157/65 152/70   Pulse: 91 81 86   Temp: 98 F (36.7 C)  97.8 F (36.6 C)   TempSrc: Axillary  Axillary   Resp: 22  20   Height:      Weight:      SpO2: 94%  96% 96%    Inpatient medications: . aspirin  81 mg Oral Daily  . famotidine  20 mg Oral Daily  . guaiFENesin  600 mg Oral BID  . hydrALAZINE  25 mg Oral 4 times per day  . ipratropium-albuterol  3 mL Nebulization TID  . isosorbide mononitrate  60 mg Oral Daily  . metoprolol tartrate  25 mg Oral TID   . sodium chloride 100 mL/hr at 07/22/15 [id number]   acetaminophen, fentaNYL (SUBLIMAZE) injection, LORazepam, LORazepam, ondansetron (ZOFRAN) IV, polyethylene glycol, RESOURCE THICKENUP CLEAR  Exam: Elderly female, eyes closed, weak respirations, looks pre -moribund possibly JVP ok Chest clear to bases bilat RRR no sig MRG Abd obese, +bilat flank/ abd wall edema, no hsm or ascites noted Bilat thigh edema, trace pretib edema Foley in place MS no wounds/ ulcers/ gangrene Neuro obtunded, not responding  12/9 UA 6-30 wbc, 0-5 rbc, 1.45019mod LE CXR 12/9 +R sided patchy infiltrates c/w PNA  Assessment: 1 Acute renal failure on CKD4 in setting of PNA/ sepsis/ transient hypotension. Azotemia and uremia worsening today.  Patient is doing very poorly, anticipate apneic episodes soon if continues to decline. She is now comfort care, have d/w family and primary MD Dr Allena KatzPatel. Decrease or dc IVF"s.   2 CKD stage 4, baseline creat 2.0 3 Parkinson's 4 HTN 5 COPD / sever PAH not candidate for Rx last admission 6 Chron debility    Plan - as above   Vinson Moselleob Meliton Samad MD [facility] pager [phone]    cell [phone] 07/22/2015, 1:14 PM    Recent Labs Lab 07/20/15 0110 07/21/15 0824 07/22/15 0745  NA  133* 134* 142  K 3.7 4.1 4.0  CL 108 109 114*  CO2 12* 11* 13*  GLUCOSE 181* 128* 119*  BUN 72* 89* 104*  CREATININE 3.72* 4.28* 4.40*  CALCIUM 8.3* 8.5* 8.3*    Recent Labs Lab 07/20/15 0110 07/21/15 0824 07/22/15 0745  AST 33 48* 26  ALT 31 40 36  ALKPHOS 71 81 79  BILITOT 0.6 0.8 0.8  PROT 5.7* 5.9* 5.8*  ALBUMIN 2.4* 2.7* 2.3*    Recent Labs Lab 07/20/15 0120 07/21/15 0824 07/22/15 0745  WBC 24.6* 26.8* 25.9*  NEUTROABS 22.7* 25.2* 24.1*  HGB 11.4* 12.8 11.8*  HCT 32.8* 36.0 33.7*  MCV 85.0 83.1 84.3  PLT 292 304 225

## 2015-07-22 NOTE — Progress Notes (Signed)
Nutrition Brief Note  Patient identified on the Low Braden Score report.  Wt Readings from Last 15 Encounters:  07/21/15 155 lb 3.3 oz (70.4 kg)  09/02/14 148 lb (67.132 kg)  06/25/14 147 lb 11.3 oz (67 kg)  02/12/14 144 lb (65.318 kg)  01/05/14 140 lb 3.2 oz (63.594 kg)  11/21/13 147 lb (66.679 kg)  09/11/13 139 lb (63.05 kg)  07/18/13 140 lb (63.504 kg)  06/21/13 144 lb (65.318 kg)  06/11/13 143 lb (64.864 kg)  04/12/13 142 lb 8 oz (64.638 kg)  03/06/13 148 lb 12.8 oz (67.495 kg)  10/01/12 152 lb 6.4 oz (69.128 kg)  08/20/12 151 lb 12.8 oz (68.856 kg)  07/17/12 150 lb 12.8 oz (68.402 kg)    Body mass index is 24.3 kg/(m^2). Patient meets criteria for normal weight based on current BMI.   Current diet order is NDD3, Nectar Thick Liquids, patient is consuming approximately 15% of meals at this time. Labs and medications reviewed.   Pt suffering from some confusion at this time r/t Sepsis, follow for increased PO intake, or Tubefeed may be warranted.  No nutrition interventions warranted at this time. If nutrition issues arise, please consult RD.   Nicole AnoWilliam M. Danyelle Brookover, MS, RD LDN After Hours/Weekend Pager [phone]

## 2015-07-23 LAB — RENAL FUNCTION PANEL
Albumin: 2.4 g/dL — ABNORMAL LOW (ref 3.5–5.0)
Anion gap: 14 (ref 5–15)
BUN: 104 mg/dL — ABNORMAL HIGH (ref 6–20)
CALCIUM: 8 mg/dL — AB (ref 8.9–10.3)
CO2: 13 mmol/L — AB (ref 22–32)
CREATININE: 4.94 mg/dL — AB (ref 0.44–1.00)
Chloride: 119 mmol/L — ABNORMAL HIGH (ref 101–111)
GFR calc Af Amer: 8 mL/min — ABNORMAL LOW (ref >60)
GFR calc non Af Amer: 7 mL/min — ABNORMAL LOW (ref >60)
GLUCOSE: 136 mg/dL — AB (ref 65–99)
Phosphorus: 6.4 mg/dL — ABNORMAL HIGH (ref 2.5–4.6)
Potassium: 4 mmol/L (ref 3.5–5.1)
SODIUM: 146 mmol/L — AB (ref 135–145)

## 2015-07-23 MED ORDER — ALBUTEROL SULFATE (2.5 MG/3ML) 0.083% IN NEBU: 2.5000 mg | INHALATION_SOLUTION | RESPIRATORY_TRACT | Status: DC | PRN

## 2015-07-23 MED ORDER — LORAZEPAM 2 MG/ML IJ SOLN
0.5000 mg | INTRAMUSCULAR | Status: DC | PRN
  Administered 2015-07-24 – 2015-07-25 (×2): 0.5 mg via INTRAVENOUS
  Filled 2015-07-23 (×2): qty 1

## 2015-07-23 MED ORDER — ACETAMINOPHEN 160 MG/5ML PO SOLN: 650.0000 mg | Freq: Four times a day (QID) | ORAL | Status: DC | PRN

## 2015-07-23 MED ORDER — SCOPOLAMINE 1 MG/3DAYS TD PT72
1.0000 | MEDICATED_PATCH | TRANSDERMAL | Status: DC
  Administered 2015-07-23: 1.5 mg via TRANSDERMAL
  Filled 2015-07-23 (×2): qty 1

## 2015-07-23 MED ORDER — MORPHINE SULFATE (PF) 2 MG/ML IV SOLN
1.0000 mg | INTRAVENOUS | Status: AC | PRN
  Administered 2015-07-23 – 2015-07-24 (×6): 2 mg via INTRAVENOUS
  Filled 2015-07-23 (×6): qty 1

## 2015-07-23 MED ORDER — ACETAMINOPHEN 650 MG RE SUPP
650.0000 mg | RECTAL | Status: DC | PRN
Start: 2015-07-23 — End: 2015-07-26

## 2015-07-23 NOTE — Progress Notes (Signed)
[facility] Progress Note    Patient: Nicole BridgemanDorothy M Harding   ZOX:[id number]  DOB: 1924-05-18     DOA: 07/25/2015 Date of Service: the patient was seen and examined on 07/23/2015  Subjective: Patient remains lethargic although more agitated. Appears to be uncomfortable.  Assessment and Plan: 1. Sepsis (HCC)  No sputum specimen, blood culture negative urine culture insignificant growth . The patient has persistent leukocytosis with worsening renal function as well as worsening mentation. She is DO NOT RESUSCITATE and DO NOT INTUBATE. And family prefer to focus on comfort given worsening mentation with uremia. Appreciate input from palliative care as well as nephrology. Patient on full comfort protocol. Prognosis at present is poor given significant worsening of her renal function.  2. Acute on chronic kidney disease Renal function continues to worsen, ultrasound renal is negative for any obstruction, continue Foley catheter due to urinary retention as well as comfort Most likely ATN in the setting of sepsis. appreciate input from nephrology With worsening of renal function and her being a poor candidate for hemodialysis is appropriate to consider comfort measures for the patient given her worsening mentation  3. Comfort measures. At present I will change her medication regimen. We will place her on IV morphine as needed as well as increase the frequency of the lorazepam. Also will place her on scopolamine patch.  4 Atrial fibrillation with RVR CHA2DS2-VASc Score 7 Not on any anticoagulation due to prior history of subdural hematoma with multiple fall.  5. Essential hypertension Focus on comfort.    6. Dysphagia Acute encephalopathy CT head unremarkable, most likely delirium secondary to sepsis Speech therapy consulted with further recommendation  7. Diarrhea  No BM here in the hospital  DVT Prophylaxis comfort care. Nutrition: as tolerated for comfort  Advance goals  of care discussion: DNR/DNI   Brief Summary of Hospitalization:  HPI: As per the H and P dictated on admission, "Nicole BridgemanDorothy M Harding is a 81 y.o. female with Past medical history of pulmonary hypertension, COPD, essential hypertension, Parkinson's disease, history of pulmonary embolism not on any anticoagulation due to recurrent fall, status post IVC filter, chronic kidney disease stage IV. The patient is presenting with complaints of worsening of her chronic shortness of breath. The patient's chronic pulmonary hypertension as well as COPD because of which she has chronic shortness of breath. For last 2 days her symptoms have been progressively worsening. She was seen by her PCP and she was started on azithromycin as well as prednisone 20 mg as an outpatient. Despite this she continues to have further worsening of shortness of breath. She also had episodes of diarrhea 2, lose watery with foul-smelling yesterday. She had one episode of vomiting earlier in the morning as well. No episodes of aspiration reported by family. She is a resident at assisted living facility. She denies any complaints of abdominal pain nausea or vomiting at the time of my evaluation. She does complains of chest congestion and shortness of breath. She denies any dizziness or lightheadedness. At her baseline the patient is able to carry out a conversation and is able to identify the family member. She generally walks with a walker." Daily update: 07/18/2015, CT scan of the head and negative, speech therapy recommends dysphagia type I diet. 07/19/2015 ultrasound renal does not show any obstruction and shows chronic medical disease. 07/20/2015 transition out of the ICU. 07/21/2015 NEPHROLOGY CONSULTED 07/22/2015 patient was placed on comfort care protocol.  Consultants: nephrology, palliative care Procedure: Echocardiogram ejection fraction 60-65%.  Antibiotics:  Anti-infectives    Start     Dose/Rate Route Frequency Ordered  Stop   07/19/15 2000  piperacillin-tazobactam (ZOSYN) IVPB 2.25 g  Status:  Discontinued     2.25 g 100 mL/hr over 30 Minutes Intravenous Every 8 hours 07/19/15 1245 07/22/15 1244   07/19/15 1800  vancomycin (VANCOCIN) IVPB 1000 mg/200 mL premix  Status:  Discontinued     1,000 mg 200 mL/hr over 60 Minutes Intravenous Every 48 hours 07/19/15 1256 07/20/15 1404   07/19/15 1300  vancomycin (VANCOCIN) IVPB 1000 mg/200 mL premix  Status:  Discontinued     1,000 mg 200 mL/hr over 60 Minutes Intravenous Every 48 hours 07/16/2015 1310 07/19/15 1244   07/30/2015 1800  piperacillin-tazobactam (ZOSYN) IVPB 2.25 g  Status:  Discontinued     2.25 g 100 mL/hr over 30 Minutes Intravenous 4 times per day 07/19/2015 1310 07/19/15 1245   08/06/2015 1315  vancomycin (VANCOCIN) 500 mg in sodium chloride 0.9 % 100 mL IVPB     500 mg 100 mL/hr over 60 Minutes Intravenous STAT 08/05/2015 1310 07/31/2015 1420   07/11/2015 1115  vancomycin (VANCOCIN) IVPB 1000 mg/200 mL premix     1,000 mg 200 mL/hr over 60 Minutes Intravenous  Once 07/22/2015 1107 07/22/2015 1243   07/16/2015 1115  piperacillin-tazobactam (ZOSYN) IVPB 3.375 g     3.375 g 100 mL/hr over 30 Minutes Intravenous STAT 07/18/2015 1112 07/16/2015 1157      Family Communication: family was present at bedside, at the time of interview.  Opportunity was given to ask question and all questions were answered satisfactorily.   Disposition:  comfort care may need hospice   Intake/Output Summary (Last 24 hours) at 07/23/15 1723 Last data filed at 07/23/15 1416  Gross per 24 hour  Intake      0 ml  Output   1000 ml  Net  -1000 ml   Filed Weights   07/19/15 0557 07/20/15 0415 07/21/15 0520  Weight: 71.1 kg (156 lb 12 oz) 75 kg (165 lb 5.5 oz) 70.4 kg (155 lb 3.3 oz)    Objective: Physical Exam: Filed Vitals:   07/22/15 1529 07/22/15 2134 07/23/15 0911 07/23/15 1416  BP:    186/75  Pulse:  90  85  Temp:    98 F (36.7 C)  TempSrc:    Axillary  Resp:  18  20    Height:      Weight:      SpO2: 98% 96% 96% 95%    General: Appear in mild distress, no Rash; Oral Mucosa moist. Cardiovascular: S1 and S2 Present, no Murmur, Respiratory: Bilateral Air entry present and bilateral  Crackles, no wheezes Abdomen: Bowel Sound sluggish  Neurology: Lethargic   Data Reviewed: CBC:  Recent Labs Lab 07/18/15 0530 07/19/15 0340 07/20/15 0120 07/21/15 0824 07/22/15 0745  WBC 18.0* 16.9* 24.6* 26.8* 25.9*  NEUTROABS 16.2* 15.2* 22.7* 25.2* 24.1*  HGB 11.2* 11.3* 11.4* 12.8 11.8*  HCT 33.6* 33.1* 32.8* 36.0 33.7*  MCV 88.0 86.6 85.0 83.1 84.3  PLT 227 255 292 304 225   Basic Metabolic Panel:  Recent Labs Lab 07/16/2015 1120  07/18/15 0530 07/19/15 0340 07/20/15 0110 07/21/15 0824 07/22/15 0745  NA 126*  < > 129* 132* 133* 134* 142  K 4.3  < > 4.2 3.9 3.7 4.1 4.0  CL 97*  < > 102 107 108 109 114*  CO2 14*  < > 15* 15* 12* 11* 13*  GLUCOSE 166*  < > 157* 140* 181*  128* 119*  BUN 46*  < > 49* 66* 72* 89* 104*  CREATININE 2.64*  < > 2.87* 3.44* 3.72* 4.28* 4.40*  CALCIUM 8.7*  < > 7.9* 7.8* 8.3* 8.5* 8.3*  MG 1.6*  --   --   --  2.0  --   --   < > = values in this interval not displayed. Liver Function Tests:  Recent Labs Lab 07/25/2015 1448 07/19/15 0340 07/20/15 0110 07/21/15 0824 07/22/15 0745  AST 81* 38 33 48* 26  ALT 34 32 31 40 36  ALKPHOS 67 58 71 81 79  BILITOT 0.9 0.8 0.6 0.8 0.8  PROT 6.0* 5.3* 5.7* 5.9* 5.8*  ALBUMIN 3.1* 2.3* 2.4* 2.7* 2.3*   No results for input(s): LIPASE, AMYLASE in the last 168 hours. No results for input(s): AMMONIA in the last 168 hours.  Cardiac Enzymes:  Recent Labs Lab 07/25/2015 1447 08/03/2015 2055 07/19/15 1353 07/19/15 1912 07/20/15 0110  TROPONINI 0.73* 1.07* 0.16* 0.15* 0.15*    Recent Results (from the past 240 hour(s))  Blood Culture (routine x 2)     Status: None   Collection Time: 07/31/2015 11:10 AM  Result Value Ref Range Status   Specimen Description BLOOD LEFT ANTECUBITAL   Final   Special Requests BOTTLES DRAWN AEROBIC AND ANAEROBIC  Final   Culture   Final    NO GROWTH 5 DAYS Performed at [facility]    Report Status 07/22/2015 FINAL  Final  Blood Culture (routine x 2)     Status: None   Collection Time: 07/12/2015 11:40 AM  Result Value Ref Range Status   Specimen Description BLOOD RIGHT WRIST  Final   Special Requests IN PEDIATRIC BOTTLE  Final   Culture   Final    NO GROWTH 5 DAYS Performed at [facility]    Report Status 07/22/2015 FINAL  Final  Urine culture     Status: None   Collection Time: 08/07/2015 12:32 PM  Result Value Ref Range Status   Specimen Description URINE, CATHETERIZED  Final   Special Requests NONE  Final   Culture   Final    2,000 COLONIES/mL INSIGNIFICANT GROWTH Performed at [facility]    Report Status 07/18/2015 FINAL  Final  MRSA PCR Screening     Status: None   Collection Time: 07/30/2015  7:02 PM  Result Value Ref Range Status   MRSA by PCR NEGATIVE NEGATIVE Final    Comment:        The GeneXpert MRSA Assay (FDA approved for NASAL specimens only), is one component of a comprehensive MRSA colonization surveillance program. It is not intended to diagnose MRSA infection nor to guide or monitor treatment for MRSA infections.      Studies: No results found.   Scheduled Meds: . scopolamine  1 patch Transdermal Q72H   Continuous Infusions:  PRN Meds: acetaminophen (TYLENOL) oral liquid 160 mg/5 mL, acetaminophen, albuterol, fentaNYL (SUBLIMAZE) injection, glycopyrrolate, LORazepam, morphine injection, ondansetron (ZOFRAN) IV, RESOURCE THICKENUP CLEAR  Time spent: 25 minutes  Author: Lynden Oxford, MD [facility] Pager: [phone] 07/23/2015 5:23 PM  If 7PM-7AM, please contact night-coverage at [email or web address], password [facility]

## 2015-07-23 NOTE — Progress Notes (Signed)
Pt daughter adamant about not turing pt unless pt has a stool and needs to be cleaned up. Pt daughter educated on rational for turning and repositioning pt in bed. Pt daughter verbalized understanding of turning and repositioning pt and still refuses.  Pt daughter states " her bottom looked good this morning when the tech cleaned her up.

## 2015-07-23 NOTE — Progress Notes (Signed)
  [facility] Progress Note   Subjective: per family is a little more responsive today. I don't see a real difference.   Filed Vitals:   07/22/15 1529 07/22/15 2134 07/23/15 0911 07/23/15 1416  BP:    186/75  Pulse:  90  85  Temp:    98 F (36.7 C)  TempSrc:    Axillary  Resp:  18  20  Height:      Weight:      SpO2: 98% 96% 96% 95%    Inpatient medications: . scopolamine  1 patch Transdermal Q72H     acetaminophen (TYLENOL) oral liquid 160 mg/5 mL, acetaminophen, albuterol, fentaNYL (SUBLIMAZE) injection, glycopyrrolate, LORazepam, morphine injection, ondansetron (ZOFRAN) IV, RESOURCE THICKENUP CLEAR  Exam: Elderly female, eyes closed, opens to voice, very weak though. Deep Kussmaul's respirations JVP ok Chest clear to bases bilat RRR no sig MRG Abd obese, +bilat flank/ abd wall edema, no hsm or ascites noted Bilat thigh edema, trace pretib edema Foley in place MS no wounds/ ulcers/ gangrene Neuro obtunded, not responding  12/9 UA 6-30 wbc, 0-5 rbc, 1.48019mod LE CXR 12/9 +R sided patchy infiltrates c/w PNA  Assessment: 1 Acute renal failure on CKD4 - ATN due to sepsis/ PNA.  Will check labs today as family is anxious about which way things are going.  On comfort care now.   2 CKD stage 4, baseline creat 2.0 3 Parkinson's 4 HTN 5 COPD / sever PAH not candidate for Rx last admission 6 Chron debility    Plan - as above   Vinson Moselleob Yula Crotwell MD [facility] pager [phone]    cell [phone] 07/23/2015, 4:24 PM    Recent Labs Lab 07/20/15 0110 07/21/15 0824 07/22/15 0745  NA 133* 134* 142  K 3.7 4.1 4.0  CL 108 109 114*  CO2 12* 11* 13*  GLUCOSE 181* 128* 119*  BUN 72* 89* 104*  CREATININE 3.72* 4.28* 4.40*  CALCIUM 8.3* 8.5* 8.3*    Recent Labs Lab 07/20/15 0110 07/21/15 0824 07/22/15 0745  AST 33 48* 26  ALT 31 40 36  ALKPHOS 71 81 79  BILITOT 0.6 0.8 0.8  PROT 5.7* 5.9* 5.8*  ALBUMIN 2.4* 2.7* 2.3*    Recent  Labs Lab 07/20/15 0120 07/21/15 0824 07/22/15 0745  WBC 24.6* 26.8* 25.9*  NEUTROABS 22.7* 25.2* 24.1*  HGB 11.4* 12.8 11.8*  HCT 32.8* 36.0 33.7*  MCV 85.0 83.1 84.3  PLT 292 304 225

## 2015-07-24 DIAGNOSIS — R0689 Other abnormalities of breathing: Secondary | ICD-10-CM

## 2015-07-24 DIAGNOSIS — R0600 Dyspnea, unspecified: Secondary | ICD-10-CM

## 2015-07-24 LAB — BASIC METABOLIC PANEL
Anion gap: 14 (ref 5–15)
BUN: 105 mg/dL — ABNORMAL HIGH (ref 6–20)
CHLORIDE: 121 mmol/L — AB (ref 101–111)
CO2: 14 mmol/L — AB (ref 22–32)
CREATININE: 4.82 mg/dL — AB (ref 0.44–1.00)
Calcium: 8.3 mg/dL — ABNORMAL LOW (ref 8.9–10.3)
GFR calc non Af Amer: 7 mL/min — ABNORMAL LOW (ref >60)
GFR, EST AFRICAN AMERICAN: 8 mL/min — AB (ref >60)
Glucose, Bld: 127 mg/dL — ABNORMAL HIGH (ref 65–99)
Potassium: 3.9 mmol/L (ref 3.5–5.1)
Sodium: 149 mmol/L — ABNORMAL HIGH (ref 135–145)

## 2015-07-24 MED ORDER — MORPHINE BOLUS VIA INFUSION
2.0000 mg | INTRAVENOUS | Status: DC | PRN
  Administered 2015-07-24 – 2015-07-25 (×5): 2 mg via INTRAVENOUS
  Filled 2015-07-24 (×6): qty 2

## 2015-07-24 MED ORDER — SODIUM CHLORIDE 0.9 % IV SOLN
1.5000 mg/h | INTRAVENOUS | Status: DC
  Administered 2015-07-24: 0.5 mg/h via INTRAVENOUS
  Filled 2015-07-24: qty 10

## 2015-07-24 NOTE — Progress Notes (Signed)
More lethargic today. Plan is for comfort care, possibly hospice placement depending on trajectory. Defer to primary team and to palliative care team.  Will sign off.   Vinson Moselleob Nyssa Sayegh MD [facility] pager [phone]    cell [phone] 07/24/2015, 1:07 PM

## 2015-07-24 NOTE — Progress Notes (Signed)
   07/24/15 1300  Clinical Encounter Type  Visited With Family  Visit Type Follow-up;Psychological support;Spiritual support  Spiritual Encounters  Spiritual Needs Emotional;Other (Comment) Veterinary surgeon(Pastoral Conversation )  Stress Factors  Family Stress Factors Loss   The Chaplain followed-up with the patient and her family from a previous visit in the ED. The patient was asleep upon the Chaplain's arrival, but the patient's daughter and grandson were at the bedside. The lights were dimmed and the family were speaking softly.  The patient's family understand the severity of the patient's situation and are praying that she isn't in any pain or suffering.  The daughter became tearful when speaking about the patient, but states that she finds peace in knowing that her mother will be out of pain.  The patient's grandson is a missionary in [place] and was at the bedside as well.  They have good family support and support from friends.  The Chaplain will follow-up upon request.    Chaplain Clint BolderBrittany Tevyn Codd M.Div.

## 2015-07-24 NOTE — Progress Notes (Signed)
Nutrition Brief Note  Chart reviewed. Pt now transitioning to comfort care.  No further nutrition interventions warranted at this time.  Please re-consult as needed.   Roby Donaway, RD, LDN Inpatient Clinical Dietitian Pager # [phone] After hours/weekend pager # [phone]    

## 2015-07-24 NOTE — Progress Notes (Signed)
[facility] Progress Note    Patient: Nicole BridgemanDorothy M Gullion   WGN:[id number]  DOB: [birth year]     DOA: 07/18/2015 Date of Service: the patient was seen and examined on 07/24/2015  Subjective: Patient is drowsy and more lethargic. Not following command, not able to provide any complaints. Activity: Mostly bedridden   Assessment and Plan: 1. Sepsis (HCC)  - No sputum specimen, blood culture negative urine culture insignificant growth . - The patient has persistent leukocytosis with worsening renal function as well as worsening mentation. - She is DO NOT RESUSCITATE and DO NOT INTUBATE.  - Palliative care consult greatly appreciated, at this point patient is comfort care measures.  2. Acute on chronic kidney disease - Renal function continues to worsen, ultrasound renal is negative for any obstruction, continue Foley catheter due to urinary retention as well as comfort - Most likely ATN in the setting of sepsis. -appreciate input from nephrology, Patient is a poor candidate for hemodialysis , creatinine was repeated yesterday and per family request, continues to decline, its of 4.94. - Patient is comfort care at this point.   3. Atrial fibrillation with RVR CHA2DS2-VASc Score 7 - Not on any anticoagulation due to prior history of subdural hematoma with multiple fall.  4. Essential hypertension - Focus on comfort.    5. Dysphagia - Acute encephalopathy - CT head unremarkable, most likely delirium secondary to sepsis  6. Diarrhea  - No recurrence  DVT Prophylaxis comfort care. Nutrition: as tolerated for comfort  Advance goals of care discussion: DNR/DNI , palliative care will discuss with family today discharge plan, discussed with daughter at bedside.  Brief Summary of Hospitalization:  HPI: As per the H and P dictated on admission, "Nicole BridgemanDorothy M Norkus is a 81 y.o. female with Past medical history of pulmonary hypertension, COPD, essential hypertension, Parkinson's  disease, history of pulmonary embolism not on any anticoagulation due to recurrent fall, status post IVC filter, chronic kidney disease stage IV. The patient is presenting with complaints of worsening of her chronic shortness of breath. The patient's chronic pulmonary hypertension as well as COPD because of which she has chronic shortness of breath. For last 2 days her symptoms have been progressively worsening. She was seen by her PCP and she was started on azithromycin as well as prednisone 20 mg as an outpatient. Despite this she continues to have further worsening of shortness of breath. She also had episodes of diarrhea 2, lose watery with foul-smelling yesterday. She had one episode of vomiting earlier in the morning as well. No episodes of aspiration reported by family. She is a resident at assisted living facility. She denies any complaints of abdominal pain nausea or vomiting at the time of my evaluation. She does complains of chest congestion and shortness of breath. She denies any dizziness or lightheadedness. At her baseline the patient is able to carry out a conversation and is able to identify the family member. She generally walks with a walker." Daily update: 07/18/2015, CT scan of the head and negative, speech therapy recommends dysphagia type I diet. 07/19/2015 ultrasound renal does not show any obstruction and shows chronic medical disease. 07/20/2015 transition out of the ICU. 07/21/2015 NEPHROLOGY CONSULTED   Consultants: nephrology, palliative care Procedure: Echocardiogram ejection fraction 60-65%.  Antibiotics: Anti-infectives    Start     Dose/Rate Route Frequency Ordered Stop   07/19/15 2000  piperacillin-tazobactam (ZOSYN) IVPB 2.25 g  Status:  Discontinued     2.25 g 100 mL/hr over 30 Minutes  Intravenous Every 8 hours 07/19/15 1245 07/22/15 1244   07/19/15 1800  vancomycin (VANCOCIN) IVPB 1000 mg/200 mL premix  Status:  Discontinued     1,000 mg 200 mL/hr over 60  Minutes Intravenous Every 48 hours 07/19/15 1256 07/20/15 1404   07/19/15 1300  vancomycin (VANCOCIN) IVPB 1000 mg/200 mL premix  Status:  Discontinued     1,000 mg 200 mL/hr over 60 Minutes Intravenous Every 48 hours 07/20/2015 1310 07/19/15 1244   07/18/2015 1800  piperacillin-tazobactam (ZOSYN) IVPB 2.25 g  Status:  Discontinued     2.25 g 100 mL/hr over 30 Minutes Intravenous 4 times per day 07/21/2015 1310 07/19/15 1245   08/02/2015 1315  vancomycin (VANCOCIN) 500 mg in sodium chloride 0.9 % 100 mL IVPB     500 mg 100 mL/hr over 60 Minutes Intravenous STAT 08/07/2015 1310 08/04/2015 1420   08/03/2015 1115  vancomycin (VANCOCIN) IVPB 1000 mg/200 mL premix     1,000 mg 200 mL/hr over 60 Minutes Intravenous  Once 07/13/2015 1107 07/28/2015 1243   07/13/2015 1115  piperacillin-tazobactam (ZOSYN) IVPB 3.375 g     3.375 g 100 mL/hr over 30 Minutes Intravenous STAT 08/04/2015 1112 08/02/2015 1157      Family Communication: Discussed with daughter at bedside  Opportunity was given to ask question and all questions were answered satisfactorily.   Disposition:  comfort care    Intake/Output Summary (Last 24 hours) at 07/24/15 1049 Last data filed at 07/24/15 0836  Gross per 24 hour  Intake      0 ml  Output   1700 ml  Net  -1700 ml   Filed Weights   07/19/15 0557 07/20/15 0415 07/21/15 0520  Weight: 71.1 kg (156 lb 12 oz) 75 kg (165 lb 5.5 oz) 70.4 kg (155 lb 3.3 oz)    Objective: Physical Exam: Filed Vitals:   07/22/15 1529 07/22/15 2134 07/23/15 0911 07/23/15 1416  BP:    186/75  Pulse:  90  85  Temp:    98 F (36.7 C)  TempSrc:    Axillary  Resp:  18  20  Height:      Weight:      SpO2: 98% 96% 96% 95%     General: Appear in mild distress, no Rash;  Cardiovascular: S1 and S2 Present, no Murmur, no JVD Respiratory: Bilateral Air entry present and bilateral  Crackles, no wheezes Abdomen: Bowel Sound present, Soft Extremities: no Pedal edema,  Neurology: Lethargic   Data  Reviewed: CBC:  Recent Labs Lab 07/18/15 0530 07/19/15 0340 07/20/15 0120 07/21/15 0824 07/22/15 0745  WBC 18.0* 16.9* 24.6* 26.8* 25.9*  NEUTROABS 16.2* 15.2* 22.7* 25.2* 24.1*  HGB 11.2* 11.3* 11.4* 12.8 11.8*  HCT 33.6* 33.1* 32.8* 36.0 33.7*  MCV 88.0 86.6 85.0 83.1 84.3  PLT 227 255 292 304 225   Basic Metabolic Panel:  Recent Labs Lab 07/30/2015 1120  07/20/15 0110 07/21/15 0824 07/22/15 0745 07/23/15 1740 07/24/15 0848  NA 126*  < > 133* 134* 142 146* 149*  K 4.3  < > 3.7 4.1 4.0 4.0 3.9  CL 97*  < > 108 109 114* 119* 121*  CO2 14*  < > 12* 11* 13* 13* 14*  GLUCOSE 166*  < > 181* 128* 119* 136* 127*  BUN 46*  < > 72* 89* 104* 104* 105*  CREATININE 2.64*  < > 3.72* 4.28* 4.40* 4.94* 4.82*  CALCIUM 8.7*  < > 8.3* 8.5* 8.3* 8.0* 8.3*  MG 1.6*  --  2.0  --   --   --   --   PHOS  --   --   --   --   --  6.4*  --   < > = values in this interval not displayed. Liver Function Tests:  Recent Labs Lab 08-02-2015 1448 07/19/15 0340 07/20/15 0110 07/21/15 0824 07/22/15 0745 07/23/15 1740  AST 81* 38 33 48* 26  --   ALT 34 32 31 40 36  --   ALKPHOS 67 58 71 81 79  --   BILITOT 0.9 0.8 0.6 0.8 0.8  --   PROT 6.0* 5.3* 5.7* 5.9* 5.8*  --   ALBUMIN 3.1* 2.3* 2.4* 2.7* 2.3* 2.4*   No results for input(s): LIPASE, AMYLASE in the last 168 hours. No results for input(s): AMMONIA in the last 168 hours.  Cardiac Enzymes:  Recent Labs Lab 08/02/15 1447 08-02-2015 2055 07/19/15 1353 07/19/15 1912 07/20/15 0110  TROPONINI 0.73* 1.07* 0.16* 0.15* 0.15*   BNP (last 3 results) No results for input(s): BNP in the last 8760 hours.  ProBNP (last 3 results) No results for input(s): PROBNP in the last 8760 hours.   CBG: No results for input(s): GLUCAP in the last 168 hours.  Recent Results (from the past 240 hour(s))  Blood Culture (routine x 2)     Status: None   Collection Time: 08/02/2015 11:10 AM  Result Value Ref Range Status   Specimen Description BLOOD LEFT  ANTECUBITAL  Final   Special Requests BOTTLES DRAWN AEROBIC AND ANAEROBIC  Final   Culture   Final    NO GROWTH 5 DAYS Performed at [facility]    Report Status 07/22/2015 FINAL  Final  Blood Culture (routine x 2)     Status: None   Collection Time: 08-02-2015 11:40 AM  Result Value Ref Range Status   Specimen Description BLOOD RIGHT WRIST  Final   Special Requests IN PEDIATRIC BOTTLE  Final   Culture   Final    NO GROWTH 5 DAYS Performed at [facility]    Report Status 07/22/2015 FINAL  Final  Urine culture     Status: None   Collection Time: 08/02/15 12:32 PM  Result Value Ref Range Status   Specimen Description URINE, CATHETERIZED  Final   Special Requests NONE  Final   Culture   Final    2,000 COLONIES/mL INSIGNIFICANT GROWTH Performed at [facility]    Report Status 07/18/2015 FINAL  Final  MRSA PCR Screening     Status: None   Collection Time: 2015/08/02  7:02 PM  Result Value Ref Range Status   MRSA by PCR NEGATIVE NEGATIVE Final    Comment:        The GeneXpert MRSA Assay (FDA approved for NASAL specimens only), is one component of a comprehensive MRSA colonization surveillance program. It is not intended to diagnose MRSA infection nor to guide or monitor treatment for MRSA infections.      Studies: No results found.   Scheduled Meds: . scopolamine  1 patch Transdermal Q72H   Continuous Infusions:  PRN Meds: acetaminophen (TYLENOL) oral liquid 160 mg/5 mL, acetaminophen, albuterol, fentaNYL (SUBLIMAZE) injection, glycopyrrolate, LORazepam, morphine injection, ondansetron (ZOFRAN) IV, RESOURCE THICKENUP CLEAR  Time spent: 20 minutes  Author: Huey Bienenstock, MD [facility] Pager: [phone] 07/24/2015 10:49 AM  If 7PM-7AM, please contact night-coverage at [email or web address], password [facility]

## 2015-07-24 NOTE — Progress Notes (Signed)
Daily Progress Note   Patient Name: Nicole Harding       Date: 07/24/2015 DOB: [birth year]  Age: 81 y.o. MRN#: [id number] Attending Physician: Albertine Patricia, MD Primary Care Physician: Velna Hatchet, MD Admit Date: 07/29/2015  Reason for Consultation/Follow-up: Establishing goals of care  Subjective: I met with Nicole Harding's family at bedside. She has progressed from yesterday with more labored irregular breathing, requiring more pain medication, not waking today. Family says that she cries out in pain with any movement even to turn or check BP. I agree that she is progressing and do not feel it ethical to put Nicole Harding through a transfer to discharge from the hospital - she would not tolerate this. Discussed with family. Also plan to initiate low dose morphine infusion to help with pain/dyspnea and family agrees. Emotional support provided. Discussed plan with Dr. Waldron Labs and [facility].    Length of Stay: 7 days  Current Medications: Scheduled Meds:  . scopolamine  1 patch Transdermal Q72H    Continuous Infusions:    PRN Meds: acetaminophen (TYLENOL) oral liquid 160 mg/5 mL, acetaminophen, albuterol, fentaNYL (SUBLIMAZE) injection, glycopyrrolate, LORazepam, morphine injection, ondansetron (ZOFRAN) IV, RESOURCE THICKENUP CLEAR  Physical Exam: Physical Exam  Constitutional: She appears well-developed. She appears lethargic.  HENT:  Head: Normocephalic and atraumatic.  Cardiovascular: Normal rate.   Pulmonary/Chest: Effort normal. Apnea noted.  Irregular, slowed, more labored than yesterday.   Abdominal: Normal appearance.  Neurological: She appears lethargic.                Vital Signs: BP 186/75 mmHg  Pulse 85  Temp(Src) 98 F (36.7 C) (Axillary)  Resp 20   Ht '5\' 7"'  (1.702 m)  Wt 70.4 kg (155 lb 3.3 oz)  BMI 24.30 kg/m2  SpO2 95% SpO2: SpO2: 95 % O2 Device: O2 Device: Nasal Cannula O2 Flow Rate: O2 Flow Rate (L/min): 3 L/min  Intake/output summary:  Intake/Output Summary (Last 24 hours) at 07/24/15 1430 Last data filed at 07/24/15 0836  Gross per 24 hour  Intake      0 ml  Output    700 ml  Net   -700 ml   LBM: Last BM Date: 07/23/15 Baseline Weight: Weight: 64.864 kg (143 lb) Most recent weight: Weight: 70.4 kg (155 lb 3.3 oz)  Palliative Assessment/Data: Flowsheet Rows        Most Recent Value   Intake Tab    Referral Department  Hospitalist   Unit at Time of Referral  Cardiac/Telemetry Unit   Palliative Care Primary Diagnosis  Cardiac   Date Notified  07/22/15   Palliative Care Type  New Palliative care   Reason for referral  Clarify Goals of Care   Date of Admission  08/03/2015   # of days IP prior to Palliative referral  5   Clinical Assessment    Psychosocial & Spiritual Assessment    Palliative Care Outcomes       Additional Data Reviewed: CBC    Component Value Date/Time   WBC 25.9* 07/22/2015 0745   WBC 12.1* 11/21/2013 1547   RBC 4.00 07/22/2015 0745   RBC 4.62 11/21/2013 1547   HGB 11.8* 07/22/2015 0745   HGB 13.1 11/21/2013 1547   HCT 33.7* 07/22/2015 0745   HCT 41.1 11/21/2013 1547   PLT 225 07/22/2015 0745   MCV 84.3 07/22/2015 0745   MCV 88.9 11/21/2013 1547   MCH 29.5 07/22/2015 0745   MCH 28.4 11/21/2013 1547   MCHC 35.0 07/22/2015 0745   MCHC 31.9 11/21/2013 1547   RDW 15.0 07/22/2015 0745   LYMPHSABS 0.5* 07/22/2015 0745   MONOABS 1.3* 07/22/2015 0745   EOSABS 0.0 07/22/2015 0745   BASOSABS 0.0 07/22/2015 0745    CMP     Component Value Date/Time   NA 149* 07/24/2015 0848   K 3.9 07/24/2015 0848   CL 121* 07/24/2015 0848   CO2 14* 07/24/2015 0848   GLUCOSE 127* 07/24/2015 0848   BUN 105* 07/24/2015 0848   CREATININE 4.82* 07/24/2015 0848   CALCIUM 8.3* 07/24/2015 0848    PROT 5.8* 07/22/2015 0745   ALBUMIN 2.4* 07/23/2015 1740   AST 26 07/22/2015 0745   ALT 36 07/22/2015 0745   ALKPHOS 79 07/22/2015 0745   BILITOT 0.8 07/22/2015 0745   GFRNONAA 7* 07/24/2015 0848   GFRAA 8* 07/24/2015 0848       Problem List:  Patient Active Problem List   Diagnosis Date Noted  . DNR (do not resuscitate) 07/22/2015  . Comfort measures only status 07/22/2015  . Palliative care encounter   . Acute encephalopathy   . Paroxysmal atrial fibrillation with rapid ventricular response (HCC), CHA2DS2-VASc Score 7 07/19/2015  . Urinary retention 07/18/2015  . Sepsis ([place]) 07/25/2015  . Hyponatremia 07/25/2015  . Acute-on-chronic kidney injury ([place]) 07/19/2015  . Elevated troponin 07/22/2015  . Hypomagnesemia 07/15/2015  . Pulmonary hypertension ([place]) 07/15/2015  . Lactic acidosis 08/05/2015  . Diarrhea 08/08/2015  . Lung mass 06/23/2014  . Pulmonary hypertensive arterial disease ([place]) 06/23/2014  . Near syncope 06/20/2014  . Malignant hypertension 06/20/2014  . Oxygen dependent 06/20/2014  . Dyspnea 07/18/2013  . GERD (gastroesophageal reflux disease) 06/23/2013  . Chronic respiratory failure ([place]) 06/23/2013  . Emphysema 06/11/2013  . Cold intolerance 06/11/2013  . Ataxia 04/12/2013  . Depression 03/06/2013  . History of pulmonary embolism 06/19/2012  . Warfarin anticoagulation 06/19/2012  . UTI (lower urinary tract infection) due to pseudomonas 06/19/2012  . SDH (subdural hematoma) ([place]) 06/18/2012  . Metabolic encephalopathy 88/82/8003  . Sepsis due to Pseudomonas ([place]) 06/18/2012  . Cervical myelopathy ([place]) 06/07/2012  . Rigors 06/05/2012  . Pulmonary embolism ([place]) 06/01/2012  . UTI (urinary tract infection) 06/01/2012  . Pain in left shoulder  06/01/2012  . Nausea 06/01/2012  . CKD (chronic kidney disease) stage 4, GFR  15-29 ml/min ([place]) 06/01/2012  . HTN (hypertension) 06/01/2012  . Accelerated hypertension 06/01/2012  . Fall-recurrent 06/01/2012   . Benign hypertensive heart disease without heart failure 12/13/2011  . Dizziness - light-headed 12/13/2011  . Chronic renal insufficiency, stage III (moderate) 12/13/2011  . Pure hypercholesterolemia 09/26/2011  . Vertigo   . History of orthostatic hypotension 02/21/2011  . Irritable bowel syndrome 02/21/2011  . Postmenopausal state 02/21/2011     Palliative Care Assessment & Plan    1.Code Status:  DNR    Code Status Orders        Start     Ordered   07/16/2015 1340  Do not attempt resuscitation (DNR)   Continuous    Question Answer Comment  In the event of cardiac or respiratory ARREST Do not call a "code blue"   In the event of cardiac or respiratory ARREST Do not perform Intubation, CPR, defibrillation or ACLS   In the event of cardiac or respiratory ARREST Use medication by any route, position, wound care, and other measures to relive pain and suffering. May use oxygen, suction and manual treatment of airway obstruction as needed for comfort.      07/11/2015 1342       2. Goals of Care/Additional Recommendations:  Comfort care.   Limitations on Scope of Treatment: Full Comfort Care  Desire for further Chaplaincy support:no  Psycho-social Needs: Grief/Bereavement Support  3. Symptom Management:      1. Pain/dyspnea: Morphine 0.5 mg/hr with 2 mg bolus every 15 min prn. May increase as needed to achieve comfort however will take 6+ hours for gtt to reach efficacy and will utilize bolus prior to increasing gtt during this time.       2. Anxiety: Lorazepam 0.5 mg every 2 hours prn.      3. Secretions: Robinul 0.2 mg every 4 hours prn.   4. Palliative Prophylaxis:   Delirium Protocol, Frequent Pain Assessment and Oral Care  5. Prognosis: Hours - Days  6. Discharge Planning:  Anticipated Hospital Death vs GIP   Thank you for allowing the Palliative Medicine Team to assist in the care of this patient.   Time In: 1400 Time Out: 1440 Total Time 57mn  Prolonged Time Billed  no         APershing Proud NP  178/24/2353 2:30 PM  Please contact Palliative Medicine Team phone at [phone] questions and concerns.

## 2015-07-25 DIAGNOSIS — E870 Hyperosmolality and hypernatremia: Secondary | ICD-10-CM

## 2015-07-25 DIAGNOSIS — R1314 Dysphagia, pharyngoesophageal phase: Secondary | ICD-10-CM

## 2015-07-25 DIAGNOSIS — A419 Sepsis, unspecified organism: Secondary | ICD-10-CM | POA: Diagnosis present

## 2015-07-25 DIAGNOSIS — N184 Chronic kidney disease, stage 4 (severe): Secondary | ICD-10-CM

## 2015-07-25 DIAGNOSIS — G9340 Encephalopathy, unspecified: Secondary | ICD-10-CM

## 2015-07-25 DIAGNOSIS — J690 Pneumonitis due to inhalation of food and vomit: Secondary | ICD-10-CM

## 2015-07-25 DIAGNOSIS — K529 Noninfective gastroenteritis and colitis, unspecified: Secondary | ICD-10-CM | POA: Diagnosis present

## 2015-07-25 DIAGNOSIS — D72829 Elevated white blood cell count, unspecified: Secondary | ICD-10-CM

## 2015-07-25 DIAGNOSIS — I10 Essential (primary) hypertension: Secondary | ICD-10-CM | POA: Diagnosis present

## 2015-07-25 DIAGNOSIS — N179 Acute kidney failure, unspecified: Secondary | ICD-10-CM

## 2015-07-25 DIAGNOSIS — R6520 Severe sepsis without septic shock: Secondary | ICD-10-CM

## 2015-07-25 MED ORDER — MORPHINE BOLUS VIA INFUSION
3.0000 mg | INTRAVENOUS | Status: DC | PRN
  Filled 2015-07-25: qty 3

## 2015-07-25 NOTE — Progress Notes (Addendum)
Patient ID: Nicole BridgemanDorothy M Worden, female   DOB: 05-Dec-1923, 81 y.o.   MRN: [id number] [facility] PROGRESS NOTE  Nicole BridgemanDorothy M Cilento [id number] DOB: 05-Dec-1923 DOA: 07/19/2015 PCP: Alysia PennaHOLWERDA, SCOTT, MD  Brief narrative:    81 y.o. female with past medical history of pulmonary hypertension, COPD, essential hypertension, Parkinson's disease, history of pulmonary embolism not on any anticoagulation due to recurrent fall, status post IVC filter, chronic kidney disease stage 4. [facility] with worsening shortness of breath for past 2 days prior to this admission. She also had few episodes of non bloody diarrhea and 1 episode of vomiting prior to this admission. She was admitted for sepsis and was on vanco and zosyn through 12/11 During this hospital stay, family decided they want to focus on comfort. All abx stopped at this point.   Anticipated discharge: Comfort care per family wishes.  Assessment/Plan:    Principal Problem: Sepsis, undertermied orgainsm with acute on chronic renal failure (HCC) / Aspiration pneumonia, right lower lung lobe /  Leukocytosis - Sepsis present on admission for which reason pt on broad spectrum abx, vanco and zosyn - CXR on 12/9 with right lung base opacity concerning for aspiration - Since family wanted to focus on comfort, abx stopped 12/11 - Palliative care following   Active Problems: Acute renal failure superimposed on CKD stage 4 - Likely ATN in the setting of sepsis, poor nutrition, dehydration  - Now GFR 7 so in CKD stage 5 - No further blood work as focus is on comfort   Atrial fibrillation with RVR CHA2DS2-VASc Score 7 - Not on any anticoagulation due to prior history of subdural hematoma with multiple falls  Essential hypertension - Focus is on comfort  Dysphagia / Acute encephalopathy - In the setting on parkinson's, dementia, lethargy - CT head with no acute findings - Aspiration precaution - Comfort feedings    Diarrhea, non infectious  - Resolved   Hypernatremia - Due to dehydration, poor po intake - No further blood work since focus is on comfort care   Elevated troponin - Due to demand ischemia from acute on chronic kidney disease - Focus on comfort   DVT Prophylaxis  - SCD's bilaterally   Code Status: DNR/DNI Family Communication:  plan of care discussed with the patient's daughter at the bedside  Disposition Plan: EOL care  IV access:  Peripheral IV  Procedures and diagnostic studies:    Dg Swallowing Func-speech Pathology 07/21/2015    Ct Head Wo Contrast 07/18/2015  1. Motion degraded examination demonstrates no acute intracranial abnormality. 2. Severe generalized atrophy and severe chronic microvascular ischemic changes of the white matter, including the brainstem and pons, stable since 2011. 3. Acute bilateral maxillary, bilateral ethmoid and left sphenoid sinus disease, likely superimposed upon chronic sinusitis.   [place] Renal 07/19/2015 No hydronephrosis. Bilateral renal increased echogenicity probable due to atrophy or chronic medical renal disease. No renal calculi. Foley catheter within urinary bladder.   Dg Chest Port 1 View 08/04/2015 Findings suggesting pulmonary arterial hypertension. No edema or consolidation. Scattered granulomas.   Dg Chest Port 1v Same Day 07/25/2015  Development of patchy opacity at the right lung base from earlier this day; aspiration, atelectasis, or developing pneumonia considered.   Dg Abd Portable 1v 07/31/2015  Inferior vena cava filter present. Bowel gas pattern unremarkable. No obstruction or free air evident.   Medical Consultants:  PCT Nephrology  Other Consultants:  Nutrition SLP PT  IAnti-Infectives:   None at this time  since focus on comfort Was on vanco and zosyn through 12/11   Manson Passey, MD  [facility] Pager [phone]  Time spent in minutes: 25 minutes  If 7PM-7AM, please contact  night-coverage [email or web address] Password [facility] 07/25/2015, 9:23 AM   LOS: 8 days    HPI/Subjective: No acute overnight events. No agitation, no restlessness in past 24 hours.   Objective: Filed Vitals:   07/22/15 1529 07/22/15 2134 07/23/15 0911 07/23/15 1416  BP:    186/75  Pulse:  90  85  Temp:    98 F (36.7 C)  TempSrc:    Axillary  Resp:  18  20  Height:      Weight:      SpO2: 98% 96% 96% 95%    Intake/Output Summary (Last 24 hours) at 07/25/15 0923 Last data filed at 07/25/15 1478  Gross per 24 hour  Intake      0 ml  Output    150 ml  Net   -150 ml    Exam:   General:  Pt is awake, apnea for few seconds in between the breaths  Cardiovascular: Rate controlled, appreciate S1, S2   Respiratory: gurgling sounds appreciated, no wheezing   Abdomen: (+) BS, non tender   Extremities: No edema, pulses DP and PT palpable bilaterally  Neuro: Grossly nonfocal  Data Reviewed: Basic Metabolic Panel:  Recent Labs Lab 07/20/15 0110 07/21/15 0824 07/22/15 0745 07/23/15 1740 07/24/15 0848  NA 133* 134* 142 146* 149*  K 3.7 4.1 4.0 4.0 3.9  CL 108 109 114* 119* 121*  CO2 12* 11* 13* 13* 14*  GLUCOSE 181* 128* 119* 136* 127*  BUN 72* 89* 104* 104* 105*  CREATININE 3.72* 4.28* 4.40* 4.94* 4.82*  CALCIUM 8.3* 8.5* 8.3* 8.0* 8.3*  MG 2.0  --   --   --   --   PHOS  --   --   --  6.4*  --    Liver Function Tests:  Recent Labs Lab 07/19/15 0340 07/20/15 0110 07/21/15 0824 07/22/15 0745 07/23/15 1740  AST 38 33 48* 26  --   ALT 32 31 40 36  --   ALKPHOS 58 71 81 79  --   BILITOT 0.8 0.6 0.8 0.8  --   PROT 5.3* 5.7* 5.9* 5.8*  --   ALBUMIN 2.3* 2.4* 2.7* 2.3* 2.4*   No results for input(s): LIPASE, AMYLASE in the last 168 hours. No results for input(s): AMMONIA in the last 168 hours. CBC:  Recent Labs Lab 07/19/15 0340 07/20/15 0120 07/21/15 0824 07/22/15 0745  WBC 16.9* 24.6* 26.8* 25.9*  NEUTROABS 15.2* 22.7* 25.2* 24.1*  HGB 11.3* 11.4* 12.8  11.8*  HCT 33.1* 32.8* 36.0 33.7*  MCV 86.6 85.0 83.1 84.3  PLT 255 292 304 225   Cardiac Enzymes:  Recent Labs Lab 07/19/15 1353 07/19/15 1912 07/20/15 0110  TROPONINI 0.16* 0.15* 0.15*   BNP: Invalid input(s): POCBNP CBG: No results for input(s): GLUCAP in the last 168 hours.  Recent Results (from the past 240 hour(s))  Blood Culture (routine x 2)     Status: None   Collection Time: 08/04/2015 11:10 AM  Result Value Ref Range Status   Specimen Description BLOOD LEFT ANTECUBITAL  Final   Special Requests BOTTLES DRAWN AEROBIC AND ANAEROBIC  Final   Culture   Final    NO GROWTH 5 DAYS Performed at [facility]    Report Status 07/22/2015 FINAL  Final  Blood Culture (routine x  2)     Status: None   Collection Time: 07-20-2015 11:40 AM  Result Value Ref Range Status   Specimen Description BLOOD RIGHT WRIST  Final   Special Requests IN PEDIATRIC BOTTLE  Final   Culture   Final    NO GROWTH 5 DAYS Performed at [facility]    Report Status 07/22/2015 FINAL  Final  Urine culture     Status: None   Collection Time: Jul 20, 2015 12:32 PM  Result Value Ref Range Status   Specimen Description URINE, CATHETERIZED  Final   Special Requests NONE  Final   Culture   Final    2,000 COLONIES/mL INSIGNIFICANT GROWTH Performed at [facility]    Report Status 07/18/2015 FINAL  Final  MRSA PCR Screening     Status: None   Collection Time: 07/20/2015  7:02 PM  Result Value Ref Range Status   MRSA by PCR NEGATIVE NEGATIVE Final     Scheduled Meds: . scopolamine  1 patch Transdermal Q72H   Continuous Infusions: . morphine 0.5 mg/hr (07/24/15 1611)

## 2015-07-25 NOTE — Progress Notes (Signed)
Daughter, grandchildren and extended family at bedside- emotional support provided- family pleased they can all be together and at her side at this time. RN reports morphine was increased- please call CSW if needed.  Reece LevyJanet Chelesa Weingartner, MSW, Theresia MajorsLCSWA  [phone]

## 2015-07-25 NOTE — Progress Notes (Signed)
Pt is sleeping and resting comfortably. Family asked that patient not be disturbed for physical assessment. Will continue to monitor. Dona Klemann, Lavone OrnSARA K, RN

## 2015-07-25 NOTE — Progress Notes (Signed)
Family refused care including vitals and assessments due to end of care status. Family at bedside. I will continue to monitor.

## 2015-08-09 NOTE — Progress Notes (Signed)
Time of death [id number]. Death was verified by Reyne DumasSonya Jahsir Rama RN and Tama GanderSophia Pickett RN. Lung sounds and all pulses were absent. MD notified and responded. Donor services and bed placement notified. 200 ml of Morphine was wasted in the sink by Jarvis NewcomerSonya Sabriya Yono RN and witnessed by Tama GanderSophia Pickett RN.

## 2015-08-09 DEATH — deceased

## 2015-09-09 NOTE — Discharge Summary (Signed)
Death Summary  REMAS SOBEL ZOX:[id number] DOB: 09-21-1923 DOA: 2015/07/24  PCP: Alysia Penna, MD PCP/Office notified  Admit date: Jul 24, 2015 Date of Death: 2015/08/20  Final Diagnoses:  Active Problems:   Sepsis due to undetermined organism, with acute renal failure (HCC)   Diarrhea   Acute encephalopathy   Noninfectious diarrhea   Essential hypertension   History of pulmonary embolism   GERD (gastroesophageal reflux disease)   Hyponatremia   Elevated troponin   Hypomagnesemia   Pulmonary hypertension (HCC)   Lactic acidosis   Urinary retention   Paroxysmal atrial fibrillation with rapid ventricular response (HCC), CHA2DS2-VASc Score 7   DNR (do not resuscitate)   Comfort measures only status   Palliative care encounter   Dyspnea and respiratory abnormalities   History of present illness:   82 y.o. female with past medical history of pulmonary hypertension, COPD, essential hypertension, Parkinson's disease, history of pulmonary embolism not on any anticoagulation due to recurrent fall, status post IVC filter, chronic kidney disease stage 4. Pt presented [facility] ED 2015/07/24 with worsening shortness of breath for past 2 days prior to this admission. She also had few episodes of non bloody diarrhea and 1 episode of vomiting prior to this admission. She was admitted for sepsis and was on vanco and zosyn through 12/11 During this hospital stay, family decided they want to focus on comfort. All abx stopped at that point.   Comfort care per family wishes.   Hospital Course:    Assessment/Plan:    Principal Problem: Sepsis, undertermied orgainsm with acute on chronic renal failure (HCC) / Aspiration pneumonia, right lower lung lobe / Leukocytosis - Sepsis present on admission for which reason pt on broad spectrum abx, vanco and zosyn - CXR on 07-24-23 with right lung base opacity concerning for aspiration - Since family wanted to focus on comfort, abx stopped 12/11 -  Palliative care was following   Active Problems: Acute renal failure superimposed on CKD stage 4 - Likely ATN in the setting of sepsis, poor nutrition, dehydration  - Now GFR 7 so in CKD stage 5  Atrial fibrillation with RVR CHA2DS2-VASc Score 7 - Not on any anticoagulation due to prior history of subdural hematoma with multiple falls  Essential hypertension - Focus on comfort  Dysphagia / Acute encephalopathy - In the setting on parkinson's, dementia, lethargy - CT head with no acute findings - Aspiration precaution - Comfort feedings   Diarrhea, non infectious  - Resolved   Hypernatremia - Due to dehydration, poor po intake  Elevated troponin - Due to demand ischemia from acute on chronic kidney disease - Focus on comfort   DVT Prophylaxis  - SCD's bilaterally   Code Status: DNR/DNI Family Communication: plan of care discussed with the patient's daughter at the bedside  Disposition Plan: EOL care  IV access:  Peripheral IV  Procedures and diagnostic studies:   Dg Swallowing Func-speech Pathology 07/21/2015   Ct Head Wo Contrast 07/18/2015 1. Motion degraded examination demonstrates no acute intracranial abnormality. 2. Severe generalized atrophy and severe chronic microvascular ischemic changes of the white matter, including the brainstem and pons, stable since 2011. 3. Acute bilateral maxillary, bilateral ethmoid and left sphenoid sinus disease, likely superimposed upon chronic sinusitis.   [place] Renal 07/19/2015 No hydronephrosis. Bilateral renal increased echogenicity probable due to atrophy or chronic medical renal disease. No renal calculi. Foley catheter within urinary bladder.   Dg Chest Port 1 View Jul 24, 2015 Findings suggesting pulmonary arterial hypertension. No edema or consolidation. Scattered  granulomas.   Dg Chest Port 1v Same Day 07/10/2015 Development of patchy opacity at the right lung base from earlier this day; aspiration, atelectasis, or  developing pneumonia considered.   Dg Abd Portable 1v 07/18/2015 Inferior vena cava filter present. Bowel gas pattern unremarkable. No obstruction or free air evident.   Medical Consultants:  PCT Nephrology  Other Consultants:  Nutrition SLP PT  IAnti-Infectives:   None at this time since focus on comfort Was on vanco and zosyn through 12/11   Time: 03:14 07/15/2015  Signed:  Manson PasseyEVINE, Adabella Stanis  [facility] 08/13/2015, 10:16 PM
